# Patient Record
Sex: Female | Born: 1985
Health system: Southern US, Community
[De-identification: ages and names within clinical notes are randomized; demographics above are authoritative.]

## PROBLEM LIST (undated history)

## (undated) ENCOUNTER — Inpatient Hospital Stay (HOSPITAL_COMMUNITY): Payer: Self-pay

## (undated) DIAGNOSIS — R51 Headache: Secondary | ICD-10-CM

## (undated) DIAGNOSIS — Z91018 Allergy to other foods: Secondary | ICD-10-CM

## (undated) DIAGNOSIS — D573 Sickle-cell trait: Secondary | ICD-10-CM

## (undated) DIAGNOSIS — M792 Neuralgia and neuritis, unspecified: Secondary | ICD-10-CM

## (undated) DIAGNOSIS — Z8673 Personal history of transient ischemic attack (TIA), and cerebral infarction without residual deficits: Secondary | ICD-10-CM

## (undated) DIAGNOSIS — B999 Unspecified infectious disease: Secondary | ICD-10-CM

## (undated) DIAGNOSIS — R7303 Prediabetes: Secondary | ICD-10-CM

## (undated) DIAGNOSIS — O4400 Placenta previa specified as without hemorrhage, unspecified trimester: Secondary | ICD-10-CM

## (undated) DIAGNOSIS — E559 Vitamin D deficiency, unspecified: Secondary | ICD-10-CM

## (undated) DIAGNOSIS — I639 Cerebral infarction, unspecified: Secondary | ICD-10-CM

## (undated) DIAGNOSIS — R079 Chest pain, unspecified: Secondary | ICD-10-CM

## (undated) DIAGNOSIS — D649 Anemia, unspecified: Secondary | ICD-10-CM

## (undated) DIAGNOSIS — R0602 Shortness of breath: Secondary | ICD-10-CM

## (undated) DIAGNOSIS — I219 Acute myocardial infarction, unspecified: Secondary | ICD-10-CM

## (undated) DIAGNOSIS — I10 Essential (primary) hypertension: Secondary | ICD-10-CM

## (undated) DIAGNOSIS — IMO0002 Reserved for concepts with insufficient information to code with codable children: Secondary | ICD-10-CM

## (undated) DIAGNOSIS — K635 Polyp of colon: Secondary | ICD-10-CM

## (undated) HISTORY — DX: Shortness of breath: R06.02

## (undated) HISTORY — DX: Polyp of colon: K63.5

## (undated) HISTORY — DX: Allergy to other foods: Z91.018

## (undated) HISTORY — DX: Acute myocardial infarction, unspecified: I21.9

## (undated) HISTORY — DX: Neuralgia and neuritis, unspecified: M79.2

## (undated) HISTORY — DX: Vitamin D deficiency, unspecified: E55.9

## (undated) HISTORY — DX: Chest pain, unspecified: R07.9

## (undated) HISTORY — DX: Prediabetes: R73.03

## (undated) HISTORY — DX: Sickle-cell trait: D57.3

## (undated) HISTORY — DX: Cerebral infarction, unspecified: I63.9

## (undated) HISTORY — DX: Anemia, unspecified: D64.9

## (undated) HISTORY — DX: Reserved for concepts with insufficient information to code with codable children: IMO0002

## (undated) HISTORY — DX: Unspecified infectious disease: B99.9

## (undated) HISTORY — DX: Complete placenta previa nos or without hemorrhage, unspecified trimester: O44.00

---
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
## 2008-02-26 ENCOUNTER — Emergency Department (HOSPITAL_COMMUNITY): Admission: EM | Admit: 2008-02-26 | Discharge: 2008-02-26 | Payer: Self-pay | Admitting: Family Medicine

## 2008-04-23 ENCOUNTER — Emergency Department (HOSPITAL_COMMUNITY): Admission: EM | Admit: 2008-04-23 | Discharge: 2008-04-23 | Payer: Self-pay | Admitting: Emergency Medicine

## 2008-08-24 ENCOUNTER — Emergency Department (HOSPITAL_COMMUNITY): Admission: EM | Admit: 2008-08-24 | Discharge: 2008-08-24 | Payer: Self-pay | Admitting: Family Medicine

## 2008-09-28 ENCOUNTER — Inpatient Hospital Stay (HOSPITAL_COMMUNITY): Admission: AD | Admit: 2008-09-28 | Discharge: 2008-09-28 | Payer: Self-pay | Admitting: Obstetrics & Gynecology

## 2008-10-05 ENCOUNTER — Inpatient Hospital Stay (HOSPITAL_COMMUNITY): Admission: RE | Admit: 2008-10-05 | Discharge: 2008-10-05 | Payer: Self-pay | Admitting: Obstetrics & Gynecology

## 2008-12-31 DIAGNOSIS — D649 Anemia, unspecified: Secondary | ICD-10-CM

## 2008-12-31 DIAGNOSIS — R87619 Unspecified abnormal cytological findings in specimens from cervix uteri: Secondary | ICD-10-CM

## 2008-12-31 DIAGNOSIS — IMO0002 Reserved for concepts with insufficient information to code with codable children: Secondary | ICD-10-CM

## 2008-12-31 DIAGNOSIS — O4400 Placenta previa specified as without hemorrhage, unspecified trimester: Secondary | ICD-10-CM

## 2008-12-31 HISTORY — DX: Reserved for concepts with insufficient information to code with codable children: IMO0002

## 2008-12-31 HISTORY — DX: Unspecified abnormal cytological findings in specimens from cervix uteri: R87.619

## 2008-12-31 HISTORY — DX: Complete placenta previa nos or without hemorrhage, unspecified trimester: O44.00

## 2008-12-31 HISTORY — PX: HYSTEROSCOPY: SHX211

## 2008-12-31 HISTORY — DX: Anemia, unspecified: D64.9

## 2009-02-27 ENCOUNTER — Observation Stay (HOSPITAL_COMMUNITY): Admission: AD | Admit: 2009-02-27 | Discharge: 2009-02-28 | Payer: Self-pay | Admitting: Obstetrics and Gynecology

## 2009-02-27 ENCOUNTER — Inpatient Hospital Stay (HOSPITAL_COMMUNITY): Admission: AD | Admit: 2009-02-27 | Discharge: 2009-02-27 | Payer: Self-pay | Admitting: Obstetrics and Gynecology

## 2009-02-28 IMAGING — CT CT HEAD W/O CM
1 series · 16 of 28 positions shown, 20 images · non-contrast
Comparison: 04/04/2005.

CLINICAL DATA: 18 weeks pregnant.  Headache.  Throwing up.

CT HEAD WITHOUT CONTRAST
TECHNIQUE: Contiguous axial images were obtained from the base of
the skull through the vertex without contrast.

[Series 2: brain · axial · 0.47mm/px · z∈[+117,+242]mm · 16 of 28 slices shown, 20 images]
[im 2/28  brain]
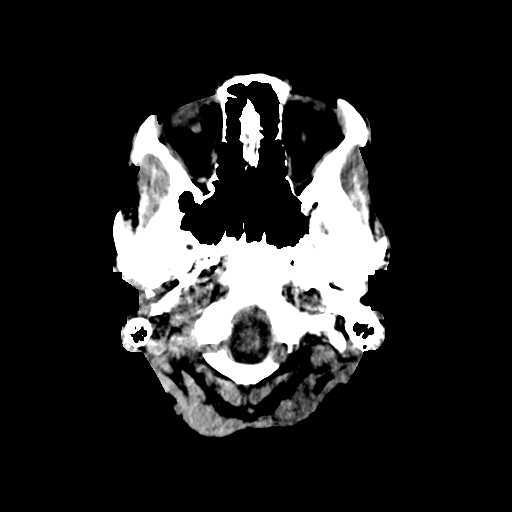
[im 2/28  bone]
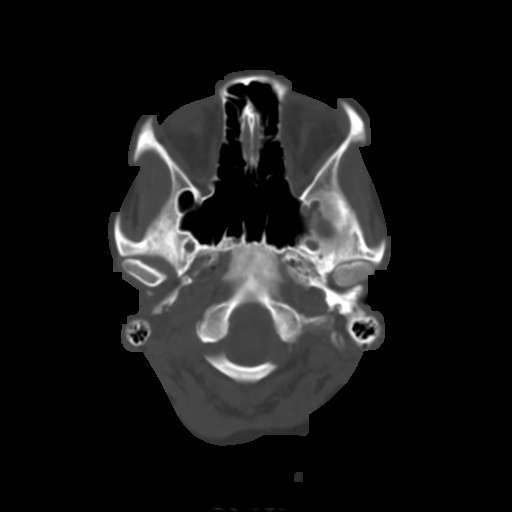
[im 4/28  brain]
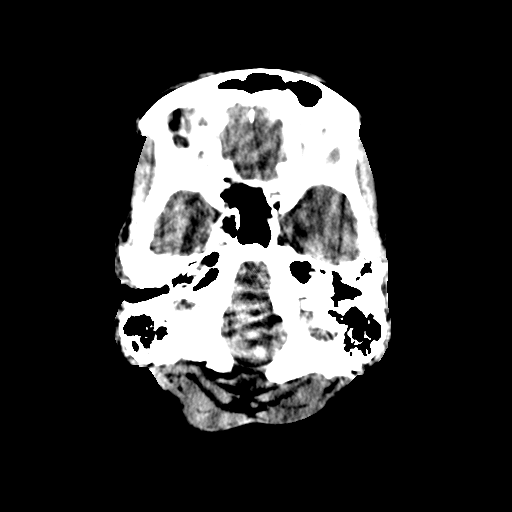
[im 6/28  brain]
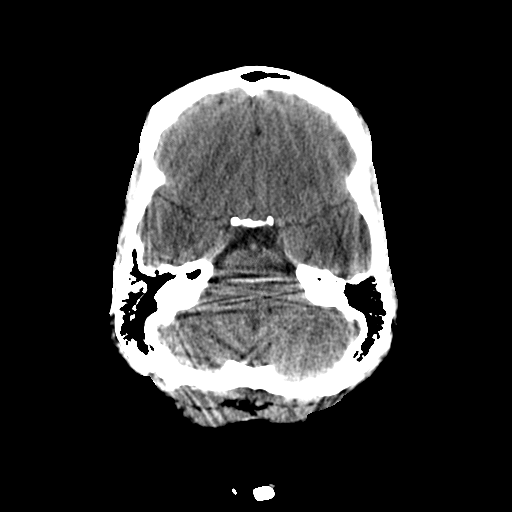
[im 7/28  brain]
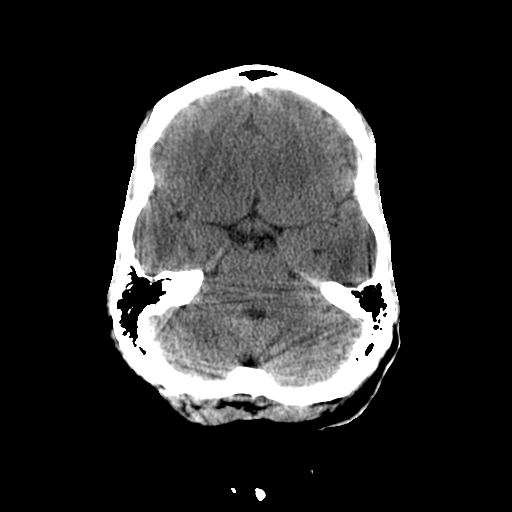
[im 9/28  brain]
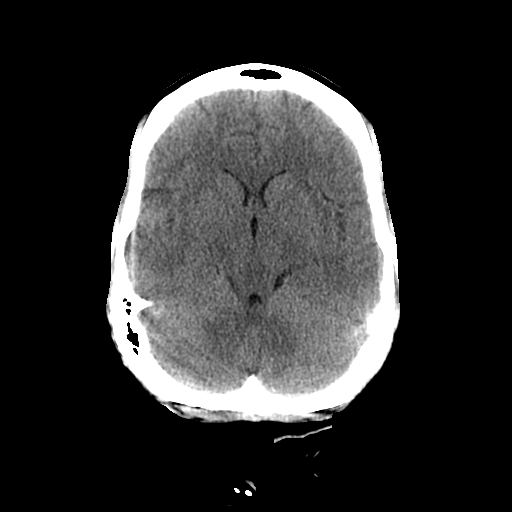
[im 9/28  bone]
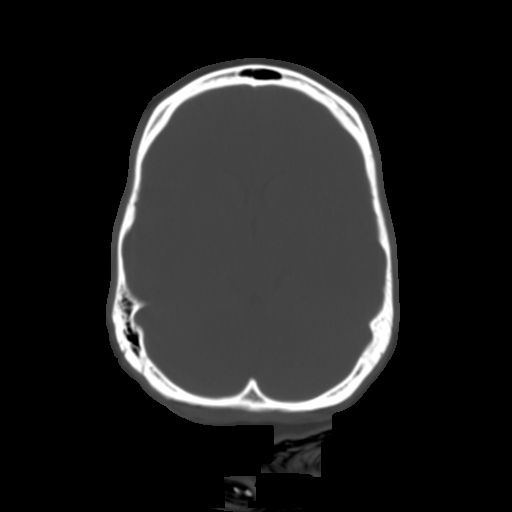
[im 10/28  brain]
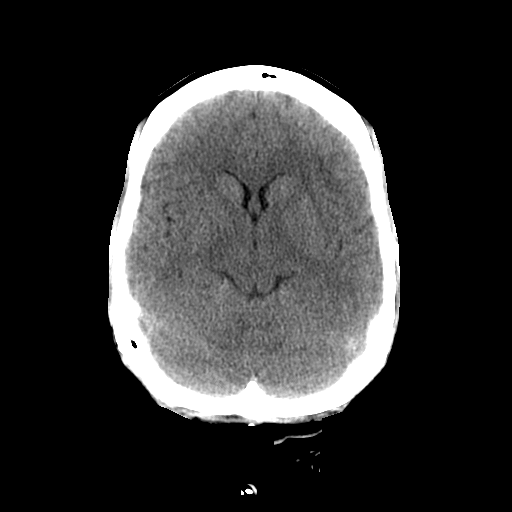
[im 12/28  brain]
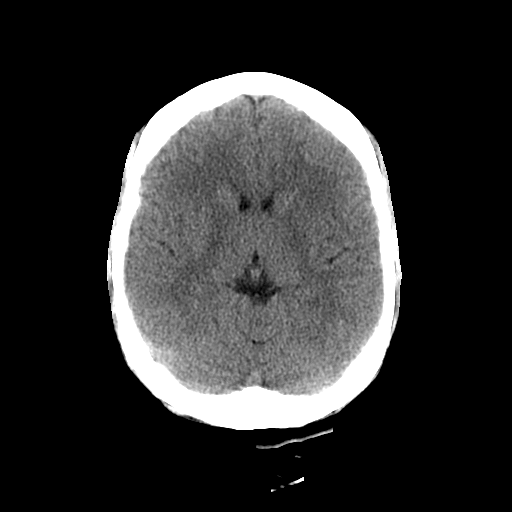
[im 14/28  brain]
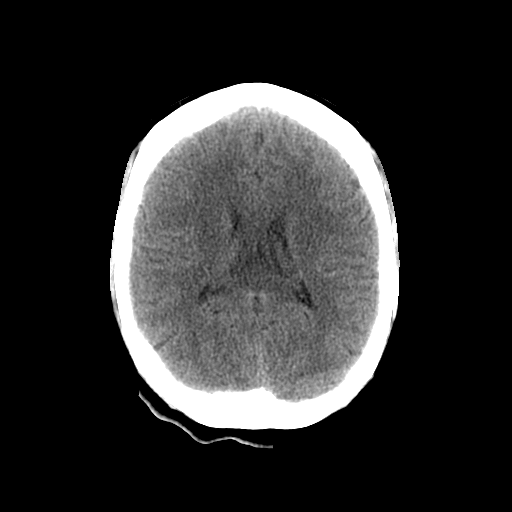
[im 15/28  brain]
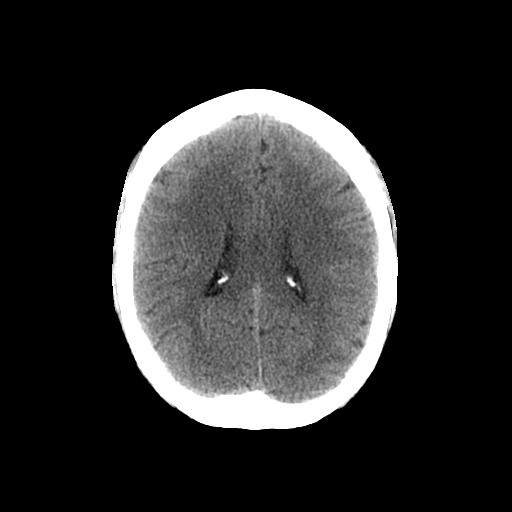
[im 15/28  bone]
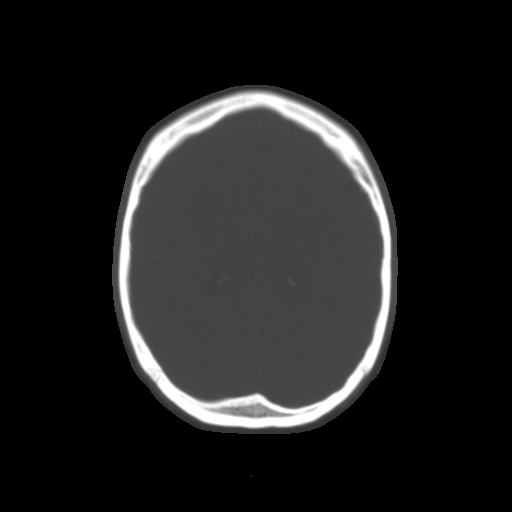
[im 17/28  brain]
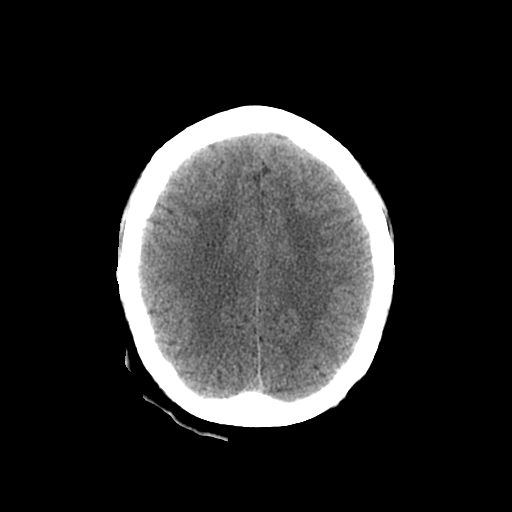
[im 19/28  brain]
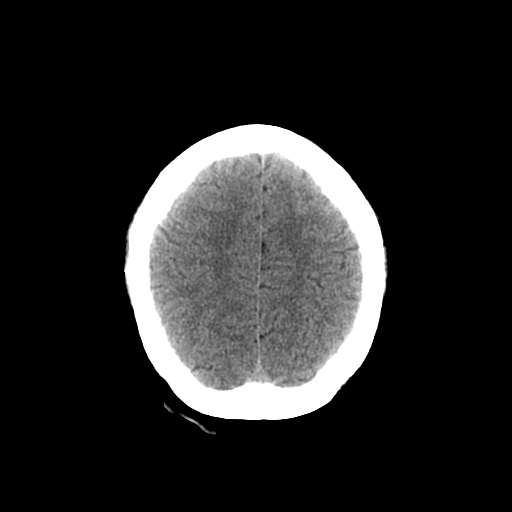
[im 20/28  brain]
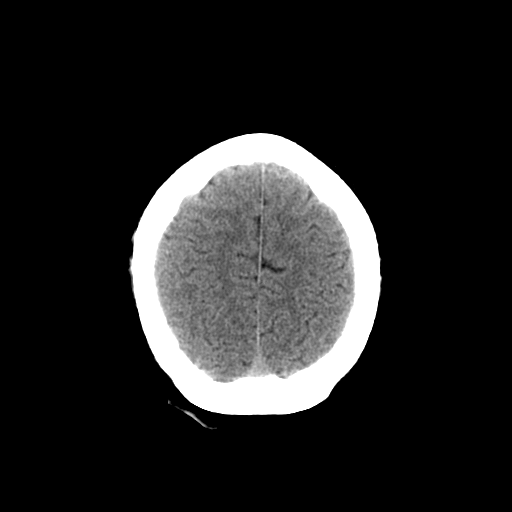
[im 22/28  brain]
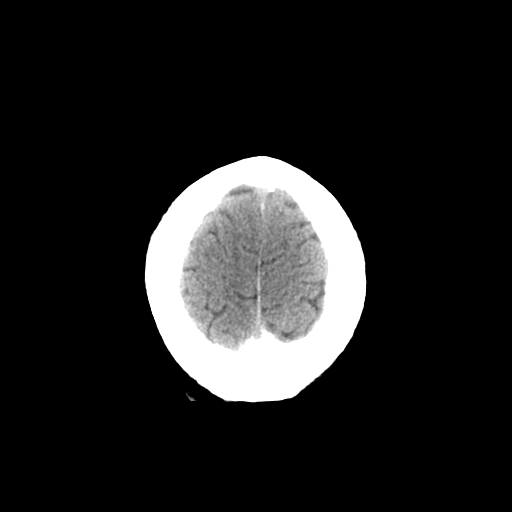
[im 22/28  bone]
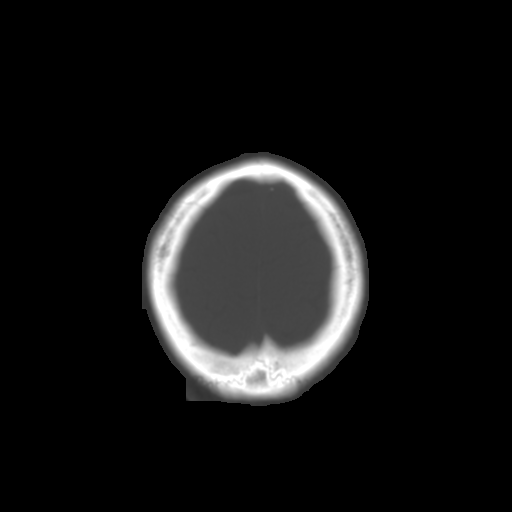
[im 23/28  brain]
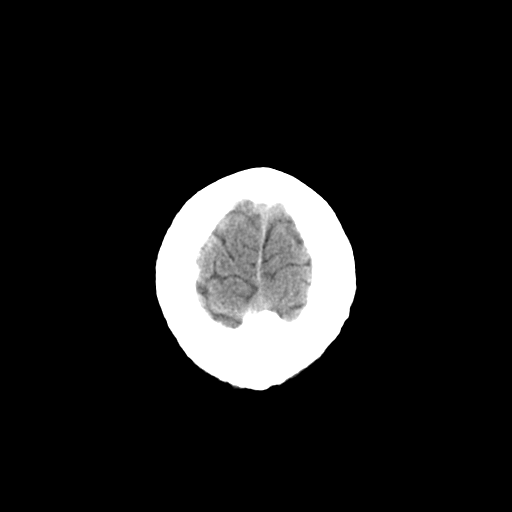
[im 25/28  brain]
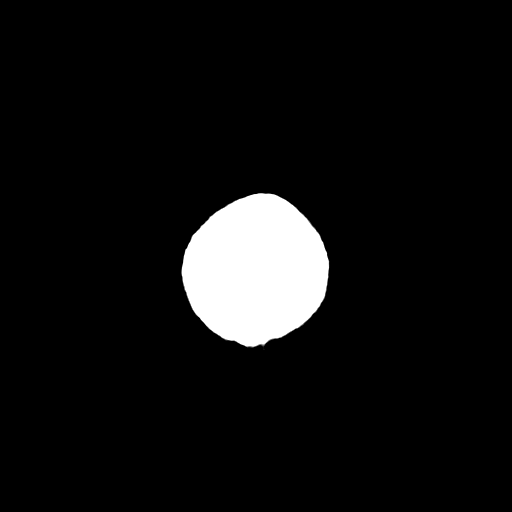
[im 27/28  brain]
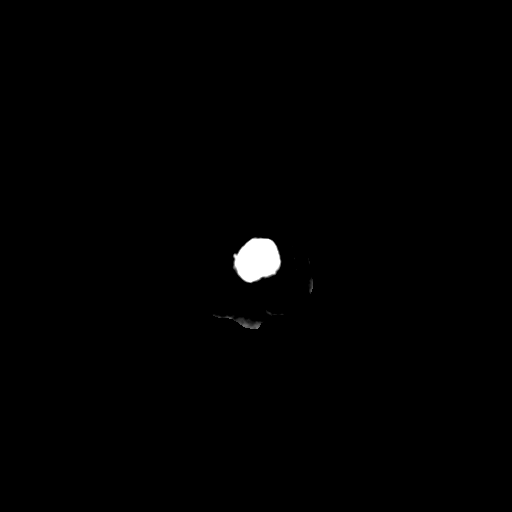

[16 of 28 positions shown; findings below may reference images not displayed]

FINDINGS: There is no mass lesion, mass effect, midline shift,
hydrocephalus, or hemorrhage.  Gray white differentiation is
preserved.  No territorial ischemia/infarct is identified.
Paranasal sinuses normal.
IMPRESSION: No acute intracranial abnormality.

## 2009-03-02 ENCOUNTER — Inpatient Hospital Stay (HOSPITAL_COMMUNITY): Admission: AD | Admit: 2009-03-02 | Discharge: 2009-03-02 | Payer: Self-pay | Admitting: Obstetrics & Gynecology

## 2009-03-10 ENCOUNTER — Inpatient Hospital Stay (HOSPITAL_COMMUNITY): Admission: AD | Admit: 2009-03-10 | Discharge: 2009-03-10 | Payer: Self-pay | Admitting: Obstetrics and Gynecology

## 2009-03-11 ENCOUNTER — Inpatient Hospital Stay (HOSPITAL_COMMUNITY): Admission: AD | Admit: 2009-03-11 | Discharge: 2009-03-13 | Payer: Self-pay | Admitting: Obstetrics and Gynecology

## 2009-03-14 ENCOUNTER — Ambulatory Visit: Admission: RE | Admit: 2009-03-14 | Discharge: 2009-03-14 | Payer: Self-pay | Admitting: Obstetrics and Gynecology

## 2009-08-26 ENCOUNTER — Encounter (INDEPENDENT_AMBULATORY_CARE_PROVIDER_SITE_OTHER): Payer: Self-pay | Admitting: Obstetrics and Gynecology

## 2009-08-26 ENCOUNTER — Ambulatory Visit (HOSPITAL_COMMUNITY): Admission: RE | Admit: 2009-08-26 | Discharge: 2009-08-26 | Payer: Self-pay | Admitting: Obstetrics and Gynecology

## 2009-08-28 ENCOUNTER — Inpatient Hospital Stay (HOSPITAL_COMMUNITY): Admission: AD | Admit: 2009-08-28 | Discharge: 2009-08-28 | Payer: Self-pay | Admitting: Obstetrics and Gynecology

## 2010-05-09 ENCOUNTER — Other Ambulatory Visit: Payer: Self-pay | Admitting: Obstetrics and Gynecology

## 2010-05-09 ENCOUNTER — Emergency Department (HOSPITAL_COMMUNITY): Admission: EM | Admit: 2010-05-09 | Discharge: 2010-05-10 | Payer: Self-pay | Admitting: Emergency Medicine

## 2010-05-11 ENCOUNTER — Emergency Department (HOSPITAL_COMMUNITY): Admission: EM | Admit: 2010-05-11 | Discharge: 2010-05-12 | Payer: Self-pay | Admitting: Emergency Medicine

## 2010-05-12 ENCOUNTER — Ambulatory Visit: Payer: Self-pay | Admitting: Cardiovascular Disease

## 2010-05-17 ENCOUNTER — Emergency Department (HOSPITAL_COMMUNITY): Admission: EM | Admit: 2010-05-17 | Discharge: 2010-05-18 | Payer: Self-pay | Admitting: Emergency Medicine

## 2010-07-07 ENCOUNTER — Emergency Department (HOSPITAL_BASED_OUTPATIENT_CLINIC_OR_DEPARTMENT_OTHER): Admission: EM | Admit: 2010-07-07 | Discharge: 2010-07-07 | Payer: Self-pay | Admitting: Emergency Medicine

## 2010-07-07 ENCOUNTER — Ambulatory Visit: Payer: Self-pay | Admitting: Diagnostic Radiology

## 2010-07-08 ENCOUNTER — Encounter: Payer: Self-pay | Admitting: Emergency Medicine

## 2010-07-08 ENCOUNTER — Emergency Department (HOSPITAL_COMMUNITY): Admission: EM | Admit: 2010-07-08 | Discharge: 2010-07-09 | Payer: Self-pay | Admitting: Emergency Medicine

## 2011-03-18 LAB — CBC
HCT: 41.2 % (ref 36.0–46.0)
Hemoglobin: 13.6 g/dL (ref 12.0–15.0)
MCH: 29.3 pg (ref 26.0–34.0)
MCHC: 33.1 g/dL (ref 30.0–36.0)
MCV: 88.5 fL (ref 78.0–100.0)
RBC: 4.65 MIL/uL (ref 3.87–5.11)

## 2011-03-18 LAB — APTT: aPTT: 32 seconds (ref 24–37)

## 2011-03-18 LAB — CK TOTAL AND CKMB (NOT AT ARMC)
CK, MB: 0.6 ng/mL (ref 0.3–4.0)
Relative Index: INVALID (ref 0.0–2.5)
Total CK: 90 U/L (ref 7–177)

## 2011-03-18 LAB — COMPREHENSIVE METABOLIC PANEL
ALT: 63 U/L — ABNORMAL HIGH (ref 0–35)
AST: 46 U/L — ABNORMAL HIGH (ref 0–37)
Alkaline Phosphatase: 116 U/L (ref 39–117)
CO2: 24 mEq/L (ref 19–32)
Calcium: 9.2 mg/dL (ref 8.4–10.5)
Chloride: 108 mEq/L (ref 96–112)
GFR calc Af Amer: >60 mL/min (ref >60)
GFR calc non Af Amer: >60 mL/min (ref >60)
Glucose, Bld: 104 mg/dL — ABNORMAL HIGH (ref 70–99)
Potassium: 3.5 mEq/L (ref 3.5–5.1)
Sodium: 138 mEq/L (ref 135–145)

## 2011-03-18 LAB — DIFFERENTIAL
Basophils Relative: 1 % (ref 0–1)
Eosinophils Absolute: 0.3 10*3/uL (ref 0.0–0.7)
Eosinophils Relative: 3 % (ref 0–5)
Lymphs Abs: 4.1 10*3/uL — ABNORMAL HIGH (ref 0.7–4.0)

## 2011-03-18 LAB — PROTIME-INR
INR: 1.02 (ref 0.00–1.49)
Prothrombin Time: 13.3 seconds (ref 11.6–15.2)

## 2011-03-19 LAB — D-DIMER, QUANTITATIVE: D-Dimer, Quant: 0.35 ug/mL-FEU (ref 0.00–0.48)

## 2011-03-19 LAB — CK TOTAL AND CKMB (NOT AT ARMC)
CK, MB: 0.4 ng/mL (ref 0.3–4.0)
Relative Index: INVALID (ref 0.0–2.5)
Total CK: 43 U/L (ref 7–177)

## 2011-03-19 LAB — CBC
Hemoglobin: 14.8 g/dL (ref 12.0–15.0)
Platelets: 224 10*3/uL (ref 150–400)
RDW: 13.6 % (ref 11.5–15.5)

## 2011-03-19 LAB — DIFFERENTIAL
Basophils Absolute: 0 10*3/uL (ref 0.0–0.1)
Lymphocytes Relative: 23 % (ref 12–46)
Monocytes Absolute: 0.4 10*3/uL (ref 0.1–1.0)
Neutro Abs: 5.7 10*3/uL (ref 1.7–7.7)
Neutrophils Relative %: 67 % (ref 43–77)

## 2011-03-19 LAB — BASIC METABOLIC PANEL
Calcium: 9.4 mg/dL (ref 8.4–10.5)
Creatinine, Ser: 0.72 mg/dL (ref 0.4–1.2)
GFR calc non Af Amer: >60 mL/min (ref >60)
Glucose, Bld: 112 mg/dL — ABNORMAL HIGH (ref 70–99)
Sodium: 135 mEq/L (ref 135–145)

## 2011-03-20 LAB — POCT CARDIAC MARKERS
CKMB, poc: <1 ng/mL — ABNORMAL LOW (ref 1.0–8.0)
CKMB, poc: <1 ng/mL — ABNORMAL LOW (ref 1.0–8.0)
Myoglobin, poc: 25.5 ng/mL (ref 12–200)
Myoglobin, poc: 51.9 ng/mL (ref 12–200)

## 2011-03-20 LAB — DIFFERENTIAL
Basophils Relative: 1 % (ref 0–1)
Eosinophils Absolute: 0.2 10*3/uL (ref 0.0–0.7)
Lymphs Abs: 3 10*3/uL (ref 0.7–4.0)
Monocytes Relative: 5 % (ref 3–12)
Neutro Abs: 4.2 10*3/uL (ref 1.7–7.7)
Neutrophils Relative %: 54 % (ref 43–77)

## 2011-03-20 LAB — POCT I-STAT, CHEM 8
BUN: 7 mg/dL (ref 6–23)
Chloride: 106 mEq/L (ref 96–112)
Chloride: 108 mEq/L (ref 96–112)
Creatinine, Ser: 0.6 mg/dL (ref 0.4–1.2)
Creatinine, Ser: 0.7 mg/dL (ref 0.4–1.2)
Glucose, Bld: 94 mg/dL (ref 70–99)
HCT: 48 % — ABNORMAL HIGH (ref 36.0–46.0)
Hemoglobin: 16.3 g/dL — ABNORMAL HIGH (ref 12.0–15.0)
Potassium: 3.2 mEq/L — ABNORMAL LOW (ref 3.5–5.1)
Potassium: 4 mEq/L (ref 3.5–5.1)
Sodium: 141 mEq/L (ref 135–145)
Sodium: 144 mEq/L (ref 135–145)

## 2011-03-20 LAB — CBC
HCT: 39.8 % (ref 36.0–46.0)
Hemoglobin: 13.6 g/dL (ref 12.0–15.0)
MCHC: 34.2 g/dL (ref 30.0–36.0)
MCV: 87.9 fL (ref 78.0–100.0)
Platelets: 200 10*3/uL (ref 150–400)
Platelets: 202 10*3/uL (ref 150–400)
RBC: 4.89 MIL/uL (ref 3.87–5.11)
RDW: 13.4 % (ref 11.5–15.5)
WBC: 7.9 10*3/uL (ref 4.0–10.5)

## 2011-03-20 LAB — GLUCOSE, CAPILLARY: Glucose-Capillary: 103 mg/dL — ABNORMAL HIGH (ref 70–99)

## 2011-03-20 LAB — D-DIMER, QUANTITATIVE: D-Dimer, Quant: <0.22 ug/mL-FEU (ref 0.00–0.48)

## 2011-04-07 LAB — URINE MICROSCOPIC-ADD ON

## 2011-04-07 LAB — CBC
MCHC: 32 g/dL (ref 30.0–36.0)
MCV: 79.9 fL (ref 78.0–100.0)
Platelets: 203 10*3/uL (ref 150–400)
Platelets: 224 10*3/uL (ref 150–400)
RBC: 4.73 MIL/uL (ref 3.87–5.11)
RDW: 19.2 % — ABNORMAL HIGH (ref 11.5–15.5)
WBC: 5.3 10*3/uL (ref 4.0–10.5)

## 2011-04-07 LAB — URINALYSIS, ROUTINE W REFLEX MICROSCOPIC
Glucose, UA: NEGATIVE mg/dL
Leukocytes, UA: NEGATIVE
Protein, ur: NEGATIVE mg/dL
Specific Gravity, Urine: >1.03 — ABNORMAL HIGH (ref 1.005–1.030)

## 2011-04-07 LAB — DIFFERENTIAL
Eosinophils Absolute: 0.3 10*3/uL (ref 0.0–0.7)
Lymphocytes Relative: 47 % — ABNORMAL HIGH (ref 12–46)
Lymphs Abs: 2.5 10*3/uL (ref 0.7–4.0)
Monocytes Relative: 10 % (ref 3–12)
Neutro Abs: 2 10*3/uL (ref 1.7–7.7)
Neutrophils Relative %: 38 % — ABNORMAL LOW (ref 43–77)

## 2011-04-07 LAB — HCG, SERUM, QUALITATIVE: Preg, Serum: NEGATIVE

## 2011-04-12 LAB — CBC
HCT: 26.7 % — ABNORMAL LOW (ref 36.0–46.0)
MCV: 87.9 fL (ref 78.0–100.0)
Platelets: 157 10*3/uL (ref 150–400)
Platelets: 163 10*3/uL (ref 150–400)
WBC: 14.2 10*3/uL — ABNORMAL HIGH (ref 4.0–10.5)
WBC: 14.4 10*3/uL — ABNORMAL HIGH (ref 4.0–10.5)

## 2011-04-17 LAB — COMPREHENSIVE METABOLIC PANEL
ALT: 14 U/L (ref 0–35)
AST: 22 U/L (ref 0–37)
Albumin: 3.1 g/dL — ABNORMAL LOW (ref 3.5–5.2)
CO2: 22 mEq/L (ref 19–32)
Chloride: 106 mEq/L (ref 96–112)
GFR calc Af Amer: >60 mL/min (ref >60)
GFR calc non Af Amer: >60 mL/min (ref >60)
Sodium: 135 mEq/L (ref 135–145)
Total Bilirubin: 0.8 mg/dL (ref 0.3–1.2)

## 2011-04-17 LAB — URINALYSIS, ROUTINE W REFLEX MICROSCOPIC
Bilirubin Urine: NEGATIVE
Nitrite: NEGATIVE
Nitrite: NEGATIVE
Specific Gravity, Urine: <1.005 — ABNORMAL LOW (ref 1.005–1.030)
Specific Gravity, Urine: <1.005 — ABNORMAL LOW (ref 1.005–1.030)
Urobilinogen, UA: 0.2 mg/dL (ref 0.0–1.0)
pH: 6 (ref 5.0–8.0)

## 2011-04-17 LAB — CBC
RBC: 4.1 MIL/uL (ref 3.87–5.11)
WBC: 9.3 10*3/uL (ref 4.0–10.5)

## 2011-04-19 ENCOUNTER — Inpatient Hospital Stay (INDEPENDENT_AMBULATORY_CARE_PROVIDER_SITE_OTHER)
Admission: RE | Admit: 2011-04-19 | Discharge: 2011-04-19 | Disposition: A | Discharge status: Home / Self Care | Payer: 59 | Source: Ambulatory Visit | Attending: Family Medicine | Admitting: Family Medicine

## 2011-04-19 DIAGNOSIS — L259 Unspecified contact dermatitis, unspecified cause: Secondary | ICD-10-CM

## 2011-04-27 ENCOUNTER — Ambulatory Visit
Admission: RE | Admit: 2011-04-27 | Discharge: 2011-04-27 | Disposition: A | Discharge status: Home / Self Care | Payer: 59 | Source: Ambulatory Visit | Attending: Chiropractic Medicine | Admitting: Chiropractic Medicine

## 2011-04-27 ENCOUNTER — Other Ambulatory Visit: Payer: Self-pay | Admitting: Chiropractic Medicine

## 2011-04-27 DIAGNOSIS — R51 Headache: Secondary | ICD-10-CM

## 2011-04-27 DIAGNOSIS — M545 Low back pain: Secondary | ICD-10-CM

## 2011-04-27 DIAGNOSIS — M542 Cervicalgia: Secondary | ICD-10-CM

## 2011-05-05 ENCOUNTER — Emergency Department (HOSPITAL_COMMUNITY)
Admission: EM | Admit: 2011-05-05 | Discharge: 2011-05-06 | Disposition: A | Discharge status: Home / Self Care | Payer: 59 | Attending: Emergency Medicine | Admitting: Emergency Medicine

## 2011-05-05 DIAGNOSIS — J40 Bronchitis, not specified as acute or chronic: Secondary | ICD-10-CM | POA: Insufficient documentation

## 2011-05-05 DIAGNOSIS — R05 Cough: Secondary | ICD-10-CM | POA: Insufficient documentation

## 2011-05-05 DIAGNOSIS — J45909 Unspecified asthma, uncomplicated: Secondary | ICD-10-CM | POA: Insufficient documentation

## 2011-05-05 DIAGNOSIS — R509 Fever, unspecified: Secondary | ICD-10-CM | POA: Insufficient documentation

## 2011-05-05 DIAGNOSIS — R059 Cough, unspecified: Secondary | ICD-10-CM | POA: Insufficient documentation

## 2011-05-15 NOTE — H&P (Signed)
NAME:  Melissa [name], GRUEL NO.:  [id number]   MEDICAL RECORD NO.:  [id number]          PATIENT TYPE:  AMB   LOCATION:  DAY                           [facility]   PHYSICIAN:  Osborn Coho, M.D.   DATE OF BIRTH:  15-Nov-1986   DATE OF ADMISSION:  08/26/2009  DATE OF DISCHARGE:                              HISTORY & PHYSICAL   HISTORY OF PRESENT ILLNESS:  Ms. Melissa [name] is a 25 year old single African  American female para 1-0-0-1 presenting for hysteroscopy with D&C  because of irregular vaginal bleeding and an endometrial polyp.  Since  delivering by spontaneous vaginal birth on March 11, 2009, the patient  states she has had daily bleeding.  The patient's bleeding has been as  brisk as requiring a pad change every 15 minutes (with occasional  soiling of her clothes) to having to use on one pad per day.  In recent  days, she has only had to change one pad per day.  She also has  experienced cramping which she rates as at 6/10 on a 10-point pain  scale; however, has not required any analgesia.  The patient was seen to  [facility] initially on August 19 at which time she was  placed on a week's worth of medroxyprogesterone acetate 10 mg daily  which the patient states decreased her bleeding some; however, she  noticed that her cramping actually increased.  She had a normal CBC,  TSH, and prolactin level along with a negative serum pregnancy test.  A  pelvic ultrasound demonstrated the uterus measuring 6.9 x 3.54 x 4.48 cm  with a hyperechoic mass within the endometrium measuring 0.71 x 0.69 cm  with single blood flow with color flow Doppler suggestive of a polyp.  The patient's left ovary measured 3.00 x 3.13 x 2.01 and right ovary  measured 3.76 x 3.58 x 2.56 cm.  The patient was given the option of  managing her bleeding and endometrial polyp through observation, undergo  a sonohystogram or hysteroscopy with D&C.  The patient has decided to  proceed with  hysteroscopy, D&C.   OB HISTORY:  Gravida 1, para 1-0-0-1.  The patient had a spontaneous  vaginal birth on March 11, 2009.  She does give a history of preterm  labor.   GYN HISTORY:  Menarche at 25 years old.  Last menstrual period on May 22, 2008.  She denies any sexually transmitted diseases or history of  abnormal Pap smears.  Last normal Pap smear was in April 2010.   PAST MEDICAL HISTORY:  Sickle cell trait, asthma, migraine headaches,  and seasonal allergies.   PAST SURGICAL HISTORY:  Negative.  The patient further denies any blood  transfusions.   FAMILY HISTORY:  Diabetes, hypertension, asthma, peptic ulcer disease.   HABITS:  She does not use tobacco, illicit drugs, and rarely consumes  alcohol except on special occasions.   SOCIAL HISTORY:  The patient is single and she is a Consulting civil engineer at [employer]   CURRENT MEDICATIONS:  None.   ALLERGIES:  GINGER which causes her throat to  close.   REVIEW OF SYSTEMS:  The patient does wear glasses.  She denies any chest  pain, shortness of breath, headache, vision changes, dysuria, hematuria,  urinary frequency, urgency, nausea, vomiting, diarrhea, fever, night  sweats and except as is mentioned in history of present illness, the  patient's review of systems is otherwise negative.   PHYSICAL EXAMINATION:  VITAL SIGNS:  Blood pressure 100/70, pulse is 68,  respiration 12, temperature 98.6 degrees Fahrenheit orally.  Weight is  170 pounds, height 5 feet 4-1/2 inches tall.  NECK: Supple without masses.  There is no thyromegaly or cervical  adenopathy.  HEART:  Regular rate and rhythm.  There is no murmur.  LUNGS:  Clear.  BACK:  No CVA tenderness.  ABDOMEN:  No tenderness, masses, or organomegaly.  EXTREMITIES:  No clubbing, cyanosis, or edema.  PELVIC:  EGBUS is within normal limits.  Vagina is rugous.  Cervix is  nontender without lesions.  Uterus appears normal size, shape, and  consistency without tenderness.  Adnexa without  tenderness.   LABORATORY DATA:  Urine pregnancy test was negative.   IMPRESSION:  1. Irregular vaginal bleeding.  2. Endometrial polyp.   DISPOSITION:  A discussion was held with the patient regarding the  indications for her procedure along with a description of the procedure  and its risks which include, but are not limited to reaction to  anesthesia, damage to adjacent organs, infection, excessive bleeding,  and the potential of endometrial scarring.  The patient verbalized  understanding of these risks.  She was also given a copy of the ACOG  brochure on hysteroscopy.  She has consented to proceed with  hysteroscopy, D&C at [facility] on August 26, 2009 at  9:15 a.m.      [place] J. Adline Peals.      Osborn Coho, M.D.  Electronically Signed    EJP/MEDQ  D:  08/23/2009  T:  08/24/2009  Job:  [id number]

## 2011-05-15 NOTE — Discharge Summary (Signed)
Melissa [name], CUMPIAN                  ACCOUNT NO.:  [id number]   MEDICAL RECORD NO.:  [id number]          PATIENT TYPE:  INP   LOCATION:  9319                          [facility]ILITY:  [facility]   PHYSICIAN:  Gerrit Friends. Aldona Bar, M.D.   DATE OF BIRTH:  July 28, 1986   DATE OF ADMISSION:  03/11/2009  DATE OF DISCHARGE:  03/13/2009                               DISCHARGE SUMMARY   DISCHARGE DIAGNOSES:  1. Term pregnancy delivered 6 pounds 15 ounces female infant, Apgars 8      and 9.  2. Blood type O+.   SUMMARY:  1. Normal spontaneous delivery.  2. Second-degree tear and repair.   SUMMARY:  This 25 year old primigravida was admitted fully dilated in  labor, after having had spontaneous rupture of membranes at home on the  early morning of March 11, 2009.  She was delivered of a 6 pounds 15  ounces female infant with Apgar of 8 and 9 over a second-degree tear  which was repaired subsequently without difficulty.  She did have some  postpartum hemorrhage treated with intramuscular Pitocin and IM  Methergine.  Her postpartum course otherwise was uncomplicated.  The  baby was transferred to the Newborn Intensive Care Unit for observation  and a sepsis workup which was negative and on the morning of March 13, 2009, both were doing well and baby was ultimately discharged to home as  well.   Mother's discharge hemoglobin was 9.0 with a white count of 14,400 and a  platelet count of 163,000.  On the morning of March 13, 2009, she was  ambulating well, tolerating a regular diet well, having normal bowel and  bladder function.  She was afebrile.  She was breast-feeding without  difficulty and was desirous of discharge.  Accordingly, she was given  all appropriate instructions and understood all instructions well.   DISCHARGE MEDICATIONS:  Vitamins - one a day as long as she is breast-  feeding, Feosol capsules - one daily, Motrin 16 mg every 6 hours as  needed for cramping or mild pain, and Tylox 1-2 evert 4-6  hours as  needed for more severe pain.  She will return to the office for followup  in approximately 4 weeks' time or as needed.  As mentioned, she was  given at discharge brochure with all appropriate instructions carefully  explained which she understood well.   CONDITION ON DISCHARGE:  Improved.      Gerrit Friends. Aldona Bar, M.D.  Electronically Signed     RMW/MEDQ  D:  03/13/2009  T:  03/14/2009  Job:  [id number]

## 2011-05-15 NOTE — Op Note (Signed)
Melissa, [name] NO.:  [id number]   MEDICAL RECORD NO.:  [id number]          PATIENT TYPE:  AMB   LOCATION:  SDC                           [facility]   PHYSICIAN:  Osborn Coho, M.D.   DATE OF BIRTH:  [birth year]   DATE OF PROCEDURE:  08/26/2009  DATE OF DISCHARGE:                               OPERATIVE REPORT   PREOPERATIVE DIAGNOSES:  1. Irregular bleeding.  2. Polyp.   POSTOPERATIVE DIAGNOSES:  1. Irregular bleeding.  2. Polyp.   PROCEDURE:  1. Hysteroscopy.  2. Dilation and curettage.   ATTENDING DOCTOR:  Osborn Coho, MD   ANESTHESIA:  General via LMA.   SPECIMEN:  To Pathology endometrial curettings.   FLUIDS:  1000 mL.   URINE OUTPUT:  Quantity sufficient via straight cath prior to procedure.   ESTIMATED BLOOD LOSS:  Minimal.   COMPLICATIONS:  None.   PROCEDURE:  The patient was taken to the operating room after the risks,  benefits, and alternatives were discussed with the patient.  The patient  verbalized understanding, consent signed and witnessed.  The patient was  placed under general anesthesia and prepped and draped in the normal  sterile fashion in the dorsal lithotomy position.  A bivalve speculum  was placed in the patient's vagina and a paracervical block administered  using a total of 10 mL of 1% lidocaine.  The cervix was dilated for  passage of the hysteroscope.  The hysteroscope was introduced and blood  clot noted in the uterine cavity.  Curettage was performed, so a gritty  texture was noted and curettings sent to pathology.  Hysteroscope was reintroduced and no obvious intracavitary lesions were  noted.  All instruments were removed.  There was good hemostasis at the  tenaculum sites.  Count was correct.  The patient tolerated the  procedure well and is currently awaiting transfer to the recovery room  in good condition.      Osborn Coho, M.D.  Electronically Signed     AR/MEDQ  D:  08/26/2009   T:  08/27/2009  Job:  [id number]

## 2011-05-18 NOTE — Discharge Summary (Signed)
Melissa [name], DOMBECK NO.:  [id number]   MEDICAL RECORD NO.:  [id number]          PATIENT TYPE:  OBV   LOCATION:  9198                          [facility]   PHYSICIAN:  Malva Limes, M.D.    DATE OF BIRTH:  1986/09/28   DATE OF ADMISSION:  02/27/2009  DATE OF DISCHARGE:  02/28/2009                               DISCHARGE SUMMARY   FINAL DIAGNOSES:  1. Intrauterine pregnancy at 55 weeks' gestation.  2. Uterine contractions.  3. False labor.   COMPLICATIONS:  None.   This 25 year old G1, P0, presents at 5 weeks' gestation with  contractions, possible labor.  The patient was admitted for observation.  The patient, all her PIH labs were normal.  She was having some  headaches.  Cervix did not change and there was no signs of labor.  The  patient was sent home on her regular diet, told to continue her prenatal  vitamins and reviewed and to keep her appointment on Thursday, March 03, 2009.  Labor precautions were reviewed with the patient.      [name], P.A.-C.    ______________________________  Malva Limes, M.D.    MB/MEDQ  D:  04/04/2009  T:  04/05/2009  Job:  [id number]

## 2011-10-01 LAB — URINALYSIS, ROUTINE W REFLEX MICROSCOPIC
Glucose, UA: NEGATIVE
Nitrite: NEGATIVE
Specific Gravity, Urine: <1.005 — ABNORMAL LOW
pH: 5.5

## 2011-10-01 LAB — COMPREHENSIVE METABOLIC PANEL
AST: 26
BUN: 2 — ABNORMAL LOW
CO2: 23
Calcium: 9.7
Creatinine, Ser: 0.58
GFR calc Af Amer: >60
GFR calc non Af Amer: >60

## 2011-10-01 LAB — CBC
HCT: 39.6
MCHC: 33.2
MCV: 88.7
Platelets: 211
RBC: 4.47

## 2011-10-01 LAB — DIFFERENTIAL
Basophils Absolute: 0
Eosinophils Relative: 2
Lymphocytes Relative: 24
Lymphs Abs: 2
Neutro Abs: 5.5

## 2012-01-01 DIAGNOSIS — I639 Cerebral infarction, unspecified: Secondary | ICD-10-CM

## 2012-01-01 HISTORY — DX: Cerebral infarction, unspecified: I63.9

## 2012-05-19 DIAGNOSIS — R0602 Shortness of breath: Secondary | ICD-10-CM | POA: Insufficient documentation

## 2012-05-19 DIAGNOSIS — G8929 Other chronic pain: Secondary | ICD-10-CM | POA: Insufficient documentation

## 2012-05-19 DIAGNOSIS — R51 Headache: Secondary | ICD-10-CM | POA: Insufficient documentation

## 2012-05-20 ENCOUNTER — Encounter (HOSPITAL_COMMUNITY): Payer: Self-pay | Admitting: Emergency Medicine

## 2012-05-20 ENCOUNTER — Emergency Department (HOSPITAL_COMMUNITY)
Admission: EM | Admit: 2012-05-20 | Discharge: 2012-05-20 | Disposition: A | Discharge status: Home / Self Care | Payer: 59 | Attending: Emergency Medicine | Admitting: Emergency Medicine

## 2012-05-20 DIAGNOSIS — R51 Headache: Secondary | ICD-10-CM

## 2012-05-20 MED ORDER — KETOROLAC TROMETHAMINE 30 MG/ML IJ SOLN
30.0000 mg | Freq: Once | INTRAMUSCULAR | Status: AC
Start: 2012-05-20 — End: 2012-05-20
  Administered 2012-05-20: 30 mg via INTRAVENOUS
  Filled 2012-05-20: qty 1

## 2012-05-20 MED ORDER — METOCLOPRAMIDE HCL 5 MG/ML IJ SOLN
10.0000 mg | Freq: Once | INTRAMUSCULAR | Status: AC
  Administered 2012-05-20: 10 mg via INTRAMUSCULAR
  Filled 2012-05-20: qty 2

## 2012-05-20 MED ORDER — DIPHENHYDRAMINE HCL 50 MG/ML IJ SOLN
25.0000 mg | Freq: Once | INTRAMUSCULAR | Status: AC
  Administered 2012-05-20: 25 mg via INTRAMUSCULAR
  Filled 2012-05-20: qty 1

## 2012-05-20 NOTE — Discharge Instructions (Signed)
Please follow up with your doctors for further workup of your ongoing pain.  Return to the ER for worsening condition or new concerning symptoms.  General Headache, Without Cause A general headache has no specific cause. These headaches are not life-threatening. They will not lead to other types of headaches. HOME CARE   Make and keep follow-up visits with your doctor.   Only take medicine as told by your doctor.   Try to relax, get a massage, or use your thoughts to control your body (biofeedback).   Apply cold or heat to the head and neck. Apply 3 or 4 times a day or as needed.  Finding out the results of your test Ask when your test results will be ready. Make sure you get your test results. GET HELP RIGHT AWAY IF:   You have problems with medicine.   Your medicine does not help relieve pain.   Your headache changes or becomes worse.   You feel sick to your stomach (nauseous) or throw up (vomit).   You have a temperature by mouth above 102 F (38.9 C), not controlled by medicine.   Your have a stiff neck.   You have vision loss.   You have muscle weakness.   You lose control of your muscles.   You lose balance or have trouble walking.   You feel like you are going to pass out (faint).  MAKE SURE YOU:   Understand these instructions.   Will watch this condition.   Will get help right away if you are not doing well or get worse.  Document Released: 09/25/2008 Document Revised: 12/06/2011 Document Reviewed: 09/25/2008 [facility] Patient Information 2012 [place], [place].  Pain Management, Chronic You have a painful condition that has required frequent use of narcotic-type pain medicine. We would like to see that you receive the best possible care for your problem. To achieve this, you must have a personal physician who can supervise a treatment plan for you. You may locate a personal physician on your own or contact one of the doctors whose name has been given to  you. If your physician determines that you need to visit the Emergency Department for pain control, that doctor should provide you with a pain contract. This is a letter from your doctor which describes what pain medicine you may receive, how much and how often. You sign it agreeing to the terms of the treatment plan. Bring this with each time you come to this facility. It will help the Emergency Physician provide the proper treatment for you with minimal delay. Please Note: In the future you may not be able to receive narcotic pain medicine from this facility without a pain contract or telephone approval from your personal physician. Document Released: 08/09/2004 Document Revised: 12/06/2011 Document Reviewed: 12/13/2008 [facility] Patient Information 2012 [place], [place].

## 2012-05-20 NOTE — ED Notes (Signed)
Pt presents with a c/c of left sided pain, from her head to her abdomen.  Pt st's she was worked up for a stroke about a year ago when she lost feeling and function of her entire left side; pt st's she has been dealing with residual effects since then.  St's she is being worked up for lupus and MS.  Pt in nad.

## 2012-05-20 NOTE — ED Notes (Addendum)
Pt states that she has had a headache most of the day and started having SOB a few hours ago. She also has a hx of asthma. Pt states that she is currently being treated at the pain clinic because they think she may have had a stroke in the past and she is being tested for MS.

## 2012-05-20 NOTE — ED Notes (Signed)
EDP Otter in with pt 

## 2012-05-20 NOTE — ED Provider Notes (Signed)
History     CSN: [id number]  Arrival date & time 05/19/12  2305   First MD Initiated Contact with Patient 05/20/12 [phone]      Chief Complaint  Patient presents with  . Headache  . Shortness of Breath    (Consider location/radiation/quality/duration/timing/severity/associated sxs/prior treatment) HPI 26 year old female presents to the emergency department complaining of headache left shoulder and axilla pain, and shortness of breath. Patient reports onset of left-sided headache yesterday afternoon. Patient has history of chronic pain on her left side thought to be due to either MS, lupus or stroke. Patient took her scheduled Vicodin, but then started having worsening flare of her chronic left-sided pain. Patient reports she does occasionally have headaches, but normal and they get better after taking Vicodin. Today's headache is left-sided. It is not associated with photophobia or phonophobia. She has had nausea but no vomiting. She denies any specific weakness numbness difficulties walking speaking associated with this headache. Headache was not sudden and worsening at onset shortness of breath is similar to her prior asthma flares. She reports the shortness of breath has resolved since arriving in the ER Past Medical History  Diagnosis Date  . Asthma     No past surgical history on file.  No family history on file.  History  Substance Use Topics  . Smoking status: Not on file  . Smokeless tobacco: Not on file  . Alcohol Use:     OB History    Grav Para Term Preterm Abortions TAB SAB Ect Mult Living                  Review of Systems  All other systems reviewed and are negative.    Allergies  Review of patient's allergies indicates no known allergies.  Home Medications   Current Outpatient Rx  Name Route Sig Dispense Refill  . HYDROCODONE-ACETAMINOPHEN 5-325 MG PO TABS Oral Take 1 tablet by mouth every 6 (six) hours as needed. For pain      BP 99/66  Pulse 93   Temp(Src) 98.2 F (36.8 C) (Oral)  Resp 18  SpO2 100%  Physical Exam  Nursing note and vitals reviewed. Constitutional: She is oriented to person, place, and time. She appears well-developed and well-nourished.  HENT:  Head: Normocephalic and atraumatic.  Nose: Nose normal.  Mouth/Throat: Oropharynx is clear and moist.  Eyes: Conjunctivae and EOM are normal. Pupils are equal, round, and reactive to light.  Neck: Normal range of motion. Neck supple. No JVD present. No tracheal deviation present. No thyromegaly present.  Cardiovascular: Normal rate, regular rhythm, normal heart sounds and intact distal pulses.  Exam reveals no gallop and no friction rub.   No murmur heard. Pulmonary/Chest: Effort normal and breath sounds normal. No stridor. No respiratory distress. She has no wheezes. She has no rales. She exhibits no tenderness.  Abdominal: Soft. Bowel sounds are normal. She exhibits no distension and no mass. There is no tenderness. There is no rebound and no guarding.  Musculoskeletal: Normal range of motion. She exhibits no edema and no tenderness.  Lymphadenopathy:    She has no cervical adenopathy.  Neurological: She is alert and oriented to person, place, and time. She has normal reflexes. No cranial nerve deficit. She exhibits normal muscle tone. Coordination normal.  Skin: Skin is dry. No rash noted. No erythema. No pallor.  Psychiatric: She has a normal mood and affect. Her behavior is normal. Judgment and thought content normal.    ED Course  Procedures (including  critical care time)  Labs Reviewed - No data to display No results found.   Date: 05/20/2012  Rate: 102  Rhythm: sinus tachycardia  QRS Axis: normal  Intervals: normal  ST/T Wave abnormalities: normal  Conduction Disutrbances:none  Narrative Interpretation:   Old EKG Reviewed: none available and unchanged   1. Headache   2. Chronic pain       MDM  Patient has had resolution of the headache. No  further shortness of breath. To followup with her primary care Dr.        Olivia Mackie, MD 05/21/12 [phone]

## 2012-07-09 ENCOUNTER — Other Ambulatory Visit: Payer: Self-pay | Admitting: Neurology

## 2012-07-09 DIAGNOSIS — G43019 Migraine without aura, intractable, without status migrainosus: Secondary | ICD-10-CM

## 2012-07-11 ENCOUNTER — Ambulatory Visit
Admission: RE | Admit: 2012-07-11 | Discharge: 2012-07-11 | Disposition: A | Discharge status: Home / Self Care | Payer: 59 | Source: Ambulatory Visit | Attending: Neurology | Admitting: Neurology

## 2012-07-11 DIAGNOSIS — G43019 Migraine without aura, intractable, without status migrainosus: Secondary | ICD-10-CM

## 2012-12-31 NOTE — L&D Delivery Note (Addendum)
Delivery Note At 1:29 PM a viable female was delivered via Vaginal, Spontaneous Delivery (Presentation: ROA ;  ).  APGAR: 6, 9; weight 8lbs 10oz.   Placenta status: Intact, Spontaneous.  Cord: 3 vessels with the following complications: shoulder dystocia less than a minute, McRoberts, Suprapubic pressure and [place] with success.  Cord pH: n/a  Anesthesia: Epidural  Episiotomy: None Lacerations: Periurethral and small vaginal Suture Repair: 4.0 monocryl Est. Blood Loss (mL): 300  Mom to postpartum.  Baby to nursery-stable.  Junius Faucett Y 10/19/2013, 1:48 PM

## 2013-02-16 ENCOUNTER — Encounter: Payer: Self-pay | Admitting: Obstetrics and Gynecology

## 2013-02-16 ENCOUNTER — Ambulatory Visit: Payer: 59 | Admitting: Obstetrics and Gynecology

## 2013-02-16 ENCOUNTER — Telehealth: Payer: Self-pay | Admitting: Obstetrics and Gynecology

## 2013-02-16 VITALS — BP 130/88 | Resp 18 | Wt 189.0 lb

## 2013-02-16 DIAGNOSIS — N840 Polyp of corpus uteri: Secondary | ICD-10-CM | POA: Insufficient documentation

## 2013-02-16 DIAGNOSIS — J45909 Unspecified asthma, uncomplicated: Secondary | ICD-10-CM

## 2013-02-16 DIAGNOSIS — G43909 Migraine, unspecified, not intractable, without status migrainosus: Secondary | ICD-10-CM | POA: Insufficient documentation

## 2013-02-16 DIAGNOSIS — J4552 Severe persistent asthma with status asthmaticus: Secondary | ICD-10-CM | POA: Insufficient documentation

## 2013-02-16 DIAGNOSIS — Z3200 Encounter for pregnancy test, result unknown: Secondary | ICD-10-CM

## 2013-02-16 DIAGNOSIS — D573 Sickle-cell trait: Secondary | ICD-10-CM | POA: Insufficient documentation

## 2013-02-16 DIAGNOSIS — N939 Abnormal uterine and vaginal bleeding, unspecified: Secondary | ICD-10-CM

## 2013-02-16 DIAGNOSIS — Z113 Encounter for screening for infections with a predominantly sexual mode of transmission: Secondary | ICD-10-CM

## 2013-02-16 DIAGNOSIS — Z331 Pregnant state, incidental: Secondary | ICD-10-CM

## 2013-02-16 LAB — POCT WET PREP (WET MOUNT)
Clue Cells Wet Prep Whiff POC: NEGATIVE
Trichomonas Wet Prep HPF POC: NEGATIVE

## 2013-02-16 LAB — POCT URINALYSIS DIPSTICK
Bilirubin, UA: NEGATIVE
Blood, UA: NEGATIVE
Glucose, UA: NEGATIVE
Ketones, UA: 2
Spec Grav, UA: 1.015
Urobilinogen, UA: 4

## 2013-02-16 NOTE — Telephone Encounter (Signed)
Pt was given an appt today with Dr. Su Hilt.  Positive pregnancy test with unusual discharge. Melissa [name]

## 2013-02-16 NOTE — Progress Notes (Signed)
Here s/p +UPT and d/--LMP 01/13/13, but was late. No pain or frank bleeding, but d/c has been pink. Pregnancy unplanned, but OK. D/C watery, no significant odor or irritation. Thinks blood type is O, but not sure. No previous blood type on file.  PE: Abdomen soft, NT Pelvic--small amount thin, white d/c in vault. Cervix NT, no evidence of bleeding. Uterus small, NT. Adnexa NT, no masses.  Wet prep negative.  Plan: Check QHCG and blood type (prenatal panel drawn, except for CBC). GC, chlamydia today. Will f/u with patient regarding result and f/u plan.  Nigel Bridgeman, CNM

## 2013-02-16 NOTE — Progress Notes (Signed)
Vaginal discharge: pink and watery Itching / Burning: no Fever: no  Symptoms have been present for spotting on and off for 2 wks. Has used over-the-counter treatment: no Associated symptoms:  Pelvic pain: yes       Dyspareunia: no     Odor:  no  History of STD:  Not that she knows of  STD screen:declined

## 2013-02-17 ENCOUNTER — Telehealth: Payer: Self-pay | Admitting: Obstetrics and Gynecology

## 2013-02-17 LAB — ABO

## 2013-02-17 LAB — GC/CHLAMYDIA PROBE AMP
CT Probe RNA: POSITIVE — AB
GC Probe RNA: NEGATIVE

## 2013-02-17 LAB — HCG, QUANTITATIVE, PREGNANCY: hCG, Beta Chain, Quant, S: 25835.6 m[IU]/mL

## 2013-02-17 NOTE — Telephone Encounter (Signed)
Message copied by Mason Jim on Tue Feb 17, 2013  8:12 AM ------      Message from: Cornelius Moras      Created: Tue Feb 17, 2013  5:53 AM       Please notify patient of [facility] results and blood type.      Schedule [place] within the next week, if possible, for dating, due to abnormal LMP.      Thanks!      VL ------

## 2013-02-17 NOTE — Telephone Encounter (Signed)
Message copied by Mason Jim on Tue Feb 17, 2013  8:13 AM ------      Message from: Cornelius Moras      Created: Tue Feb 17, 2013  5:53 AM       Please notify patient of [facility] results and blood type.      Schedule [place] within the next week, if possible, for dating, due to abnormal LMP.      Thanks!      VL ------

## 2013-02-17 NOTE — Telephone Encounter (Signed)
LM to return call.

## 2013-02-18 ENCOUNTER — Encounter: Payer: Self-pay | Admitting: Obstetrics and Gynecology

## 2013-02-18 DIAGNOSIS — O98819 Other maternal infectious and parasitic diseases complicating pregnancy, unspecified trimester: Secondary | ICD-10-CM

## 2013-02-18 DIAGNOSIS — A749 Chlamydial infection, unspecified: Secondary | ICD-10-CM

## 2013-02-18 LAB — CULTURE, OB URINE
Colony Count: NO GROWTH
Organism ID, Bacteria: NO GROWTH

## 2013-02-19 ENCOUNTER — Telehealth: Payer: Self-pay

## 2013-02-19 NOTE — Telephone Encounter (Signed)
LM for pt to cb 

## 2013-02-19 NOTE — Telephone Encounter (Signed)
Message copied by Janeece Agee on Thu Feb 19, 2013  4:49 PM ------      Message from: Cornelius Moras      Created: Wed Feb 18, 2013  6:00 PM       Please call patient and advise of results.      Rx Zithromax 1 gm dose, no refills for patient and partner, if patient desires.      Notify [facility].      Plan TOC in 3 weeks if OB patient.            Thanks!      VL ------

## 2013-02-23 ENCOUNTER — Telehealth: Payer: Self-pay | Admitting: Obstetrics and Gynecology

## 2013-02-23 NOTE — Telephone Encounter (Signed)
TC from pt. States was seen at ER in [place] today for bleeding. States no further bleeding. Is having some back pain but feels it is due from not resting. States has U/S result documenting 6wk 6 day IUP with +cardiac activity. Suggested pt take Tylenol.  To call if back pain continues, recurrence of bleeding, cramping or any concerns.  Otherwise to keep appt 02/26/13 for NOB interview. Pt verbalizes comprehension.

## 2013-02-25 ENCOUNTER — Telehealth: Payer: Self-pay | Admitting: Obstetrics and Gynecology

## 2013-02-25 ENCOUNTER — Other Ambulatory Visit: Payer: Self-pay

## 2013-02-25 DIAGNOSIS — Z3687 Encounter for antenatal screening for uncertain dates: Secondary | ICD-10-CM

## 2013-02-25 NOTE — Telephone Encounter (Signed)
[facility] fax sent

## 2013-02-25 NOTE — Telephone Encounter (Signed)
Spoke with pt informing her +CT. Zithromax 1gm no rf sent to verified pharmacy [facility] via telephone. Pt unsure at the moment whether or not she wants her partner treated. Pt has NOB interview tomorrow and will let us know then. TOC is needed 3 wks from 02/16/13. Pt will schedule TOC appt during NOB interview visit as well.

## 2013-02-25 NOTE — Telephone Encounter (Signed)
TC from Georgeanna Harrison nurse at [facility] [phone]. Pt was seen in ER and received + chlamydia results today. Has appt at this office 02/26/13 and requested result be addressed. Per note by TG, pt was called today and Rx given.

## 2013-02-26 ENCOUNTER — Ambulatory Visit: Payer: 59

## 2013-02-26 ENCOUNTER — Ambulatory Visit: Payer: 59 | Admitting: Obstetrics and Gynecology

## 2013-02-26 ENCOUNTER — Other Ambulatory Visit: Payer: Self-pay | Admitting: Obstetrics and Gynecology

## 2013-02-26 ENCOUNTER — Encounter: Payer: Self-pay | Admitting: Obstetrics and Gynecology

## 2013-02-26 DIAGNOSIS — Z331 Pregnant state, incidental: Secondary | ICD-10-CM

## 2013-02-26 LAB — US OB TRANSVAGINAL

## 2013-02-26 NOTE — Progress Notes (Signed)
[redacted]w[redacted]d Dating Ultrasound: singleton pregnancy, [redacted]w[redacted]d IUP, yolk sac seen, normal ovaries, no fluid in CDS, normal adenexas.

## 2013-02-26 NOTE — Progress Notes (Signed)
Here for f/u US for viability. Reassured regarding status of pregnancy, with viable IUP at 7 1/7 weeks, [facility] 10/14/13. Considering waterbirth--issues and preparation reviewed.  Needs to attend class. Needs TOC chlamydia at NOB w/u. Schedule NOB w/u in 3 weeks.

## 2013-02-27 NOTE — Progress Notes (Signed)
NOB interview completed.  PNV samples given.  Pt has viability [place] today after interview for dating.  NOB workup scheduled for Friday 03/20/13 w/ CA @0900 .

## 2013-03-10 ENCOUNTER — Telehealth: Payer: Self-pay | Admitting: Obstetrics and Gynecology

## 2013-03-10 NOTE — Telephone Encounter (Signed)
TC from pt. Questioning if has any problem with placenta as had previa last pregnancy. Informed no specific mention of placenta on U/S report. Had bleeding episode 4 weeks ago. States is very nervous about having IC. Has appt 03/20/13. Informed will be able to hear FHT at that time. May want to wait to have IC until that time if would be more reassuring. Pt verbalizes comprehension.

## 2013-03-10 NOTE — Telephone Encounter (Signed)
Returned pt's call. LM to return call.   

## 2013-03-12 ENCOUNTER — Telehealth: Payer: Self-pay | Admitting: Obstetrics and Gynecology

## 2013-03-12 NOTE — Telephone Encounter (Signed)
TC from pt. States is having lower abd/groin pain that is aggravated with sudden change in position, coughing and then is relieved. Discussed round ligament pain. Advised increase water to 8-10 glasses/day. May take Tylenol, try warm bath. Also states has had dizziness  On and off since 03/11/13. Is eating q 2-3 hours but not having protein snacks. Advised to include protein with snacks.  To call if either sx does not improve or with other concerns.  Pt verbalizes comprehension.

## 2013-03-13 ENCOUNTER — Telehealth: Payer: Self-pay | Admitting: Obstetrics and Gynecology

## 2013-03-13 NOTE — Telephone Encounter (Signed)
TC from pt. States her toddler ran into her this am and is now bleeding. Is out of town and currently being seen at hospital in [place]. To call with status. Hospital may call if needs any records. Pt verbalizes comprehension.

## 2013-03-18 ENCOUNTER — Encounter: Payer: Self-pay | Admitting: Certified Nurse Midwife

## 2013-03-18 ENCOUNTER — Other Ambulatory Visit: Payer: 59

## 2013-03-18 ENCOUNTER — Ambulatory Visit: Payer: 59 | Admitting: Certified Nurse Midwife

## 2013-03-18 VITALS — BP 112/60 | Wt 184.0 lb

## 2013-03-18 DIAGNOSIS — O200 Threatened abortion: Secondary | ICD-10-CM

## 2013-03-18 DIAGNOSIS — Z331 Pregnant state, incidental: Secondary | ICD-10-CM

## 2013-03-18 NOTE — Progress Notes (Signed)
[redacted]w[redacted]d  Pt has been bleeding since last Friday Pt had U/S today

## 2013-03-18 NOTE — Progress Notes (Signed)
Pt. States she starting bleeding again since last Friday. Mini pad in past 3 hours. No clots passed. No pain.  US done and indicates a viable fetus. EDC 10-14-2013.   Pt. Also was treated for chlamydia in Feb. 2014.  States partner also treated. Exam-Speculum-scant pink d/c without clots.  Cx. Pink without lesions. Bimanual- Uterus enlarged   To 10 wk size, adnexa normal. No CMT. Hulen Luster, CNM

## 2013-03-19 LAB — PAP IG W/ RFLX HPV ASCU

## 2013-04-09 ENCOUNTER — Encounter (HOSPITAL_COMMUNITY): Payer: Self-pay

## 2013-04-09 ENCOUNTER — Inpatient Hospital Stay (HOSPITAL_COMMUNITY)
Admission: AD | Admit: 2013-04-09 | Discharge: 2013-04-09 | Disposition: A | Discharge status: Home / Self Care | Payer: 59 | Source: Ambulatory Visit | Attending: Obstetrics and Gynecology | Admitting: Obstetrics and Gynecology

## 2013-04-09 DIAGNOSIS — O219 Vomiting of pregnancy, unspecified: Secondary | ICD-10-CM

## 2013-04-09 DIAGNOSIS — R109 Unspecified abdominal pain: Secondary | ICD-10-CM | POA: Insufficient documentation

## 2013-04-09 DIAGNOSIS — R51 Headache: Secondary | ICD-10-CM

## 2013-04-09 DIAGNOSIS — O99891 Other specified diseases and conditions complicating pregnancy: Secondary | ICD-10-CM | POA: Insufficient documentation

## 2013-04-09 DIAGNOSIS — O210 Mild hyperemesis gravidarum: Secondary | ICD-10-CM | POA: Insufficient documentation

## 2013-04-09 DIAGNOSIS — Z8673 Personal history of transient ischemic attack (TIA), and cerebral infarction without residual deficits: Secondary | ICD-10-CM

## 2013-04-09 HISTORY — DX: Personal history of transient ischemic attack (TIA), and cerebral infarction without residual deficits: Z86.73

## 2013-04-09 HISTORY — DX: Headache: R51

## 2013-04-09 LAB — URINALYSIS, ROUTINE W REFLEX MICROSCOPIC
Ketones, ur: NEGATIVE mg/dL
Nitrite: NEGATIVE
Protein, ur: NEGATIVE mg/dL
Urobilinogen, UA: 0.2 mg/dL (ref 0.0–1.0)

## 2013-04-09 MED ORDER — BUTORPHANOL TARTRATE 1 MG/ML IJ SOLN
2.0000 mg | Freq: Once | INTRAMUSCULAR | Status: AC
  Administered 2013-04-09: 2 mg via INTRAVENOUS
  Filled 2013-04-09: qty 2

## 2013-04-09 MED ORDER — ONDANSETRON 8 MG PO TBDP: 8.0000 mg | ORAL_TABLET | Freq: Three times a day (TID) | ORAL | Status: DC | PRN

## 2013-04-09 MED ORDER — ONDANSETRON HCL 4 MG/2ML IJ SOLN
4.0000 mg | Freq: Once | INTRAMUSCULAR | Status: AC
  Administered 2013-04-09: 4 mg via INTRAVENOUS
  Filled 2013-04-09: qty 2

## 2013-04-09 MED ORDER — PROMETHAZINE HCL 25 MG PO TABS: 25.0000 mg | ORAL_TABLET | Freq: Four times a day (QID) | ORAL | Status: DC | PRN

## 2013-04-09 MED ORDER — LACTATED RINGERS IV BOLUS (SEPSIS)
1000.0000 mL | Freq: Once | INTRAVENOUS | Status: AC
  Administered 2013-04-09: 1000 mL via INTRAVENOUS

## 2013-04-09 MED ORDER — PANTOPRAZOLE SODIUM 40 MG IV SOLR
40.0000 mg | Freq: Once | INTRAVENOUS | Status: AC
  Administered 2013-04-09: 40 mg via INTRAVENOUS
  Filled 2013-04-09: qty 40

## 2013-04-09 NOTE — MAU Provider Note (Signed)
History   Melissa [name] is a 26y.o. BF who presents at [redacted]w[redacted]d unannounced w/ CC of N/v and headache throughout the day.  She reports headache is on top of head and frontal/temporal areas. Denies sinus pressure or pain.  No recent fever.  No resp c/o's.  Normal BM's.  Has thrown up 5-6 times total today, but sometimes intake stays down for 1-2 hrs.  No one is sick around her.  She has a 4y.o. Daughter.  No change in diet or recent traveling.  Some cramping in lower abdomen, but only present as day has progressed.  No VB, LOF, or abnl d/c.  No UTI s/s.  Pt works at [employer] time."  Pt reports n/v much worse earlier in pregnancy.  Last emesis episodes were last week, prior to today.  Pt's problem list did not reflect h/o presumptive CVA 2 yrs ago.  Pt is unsure if blot or bleed.  Has residual "chronic nerve pain" occasionally on Lt side, esp arm, hands.  Followed by [facility] neurology, and was originally Rx'd daily "Hydrocodone and Mobic." Pt currently off meds for some time; had also initially been followed by a pain clinic.    Pt is not having any difficulty speaking, no unilateral weakness, or other s/s of stroke tonight.   .. Patient Active Problem List  Diagnosis  . Hx Endometrial polyp  . Sickle cell trait  . Asthma  . Migraines  . Chlamydia infection, current pregnancy  . PPH (postpartum hemorrhage)  . History of CVA (cerebrovascular accident)    CSN: [id number]  Arrival date and time: 04/09/13 2030   None     Chief Complaint  Patient presents with  . Headache  . Emesis During Pregnancy  . Abdominal Cramping   HPI  OB History   Grav Para Term Preterm Abortions TAB SAB Ect Mult Living   2 1 1       1       Past Medical History  Diagnosis Date  . Placenta previa antepartum 2010    Placenta moved 3 days before baby was born  . Abnormal Pap smear 2010    Repeat pap done;Hysteroscopy;Last pap 2012;was normal  . Infection     BV;may get frequently  . Anemia 2010    Iron  supplements PP  . Asthma     Has a nebulizer;triggered by stress  . Stroke 2013    MD thought may have had a stroke and heart attack;has neurologist @ [facility]  . Heart attack     MD thinks may have had a heart attack;Cardiologist @ [facility]  . Nerve pain     Chronic;d/t possible stroke and MI  . Sickle cell trait   . Headache     Past Surgical History  Procedure Laterality Date  . Hysteroscopy  2010    Family History  Problem Relation Age of Onset  . Sickle cell trait Father   . Hypertension Mother   . Cervical cancer Paternal Aunt   . Cervical cancer Paternal Grandmother   . Anemia Mother   . Diabetes Paternal Grandmother   . Diabetes Paternal Grandfather   . Diabetes Paternal Aunt   . Asthma Brother   . Asthma Daughter   . Asthma Mother     History  Substance Use Topics  . Smoking status: Never Smoker   . Smokeless tobacco: Never Used  . Alcohol Use: Yes     Comment: socially;prior to pregnancy    Allergies:  Allergies  Allergen Reactions  .  Ginger Swelling    Prescriptions prior to admission  Medication Sig Dispense Refill  . acetaminophen (TYLENOL) 325 MG tablet Take 650 mg by mouth every 6 (six) hours as needed for pain.      [name] albuterol (PROVENTIL HFA;VENTOLIN HFA) 108 (90 BASE) MCG/ACT inhaler Inhale 2 puffs into the lungs every 6 (six) hours as needed for wheezing or shortness of breath.      [name] albuterol (PROVENTIL) (5 MG/ML) 0.5% nebulizer solution Take 2.5 mg by nebulization daily as needed (For chest tightness.).      [name] Prenatal Vit-Fe Fumarate-FA (PRENATAL MULTIVITAMIN) TABS Take 1 tablet by mouth daily at 12 noon.        ROS--see HPIV Physical Exam   Blood pressure 112/87, pulse 87, temperature 98.2 F (36.8 C), temperature source Oral, resp. rate 16, height 5\' 4"  (1.626 m), weight 186 lb 6.4 oz (84.55 kg), last menstrual period 01/13/2013, SpO2 99.00%.  Physical Exam  Constitutional: She is oriented to person, place, and time. She  appears well-developed and well-nourished.  Some grimace; no guarding;   HENT:  Head: Normocephalic and atraumatic.  Eyes: Pupils are equal, round, and reactive to light.  Cardiovascular: Normal rate.   Respiratory: Effort normal.  GI: Soft. She exhibits no distension and no mass. There is no tenderness. There is no rebound and no guarding.  Genitourinary:  Pelvic deferred  Neurological: She is alert and oriented to person, place, and time.  Skin: Skin is warm and dry.  Psychiatric: She has a normal mood and affect. Her behavior is normal. Judgment and thought content normal.  .. Results for orders placed during the hospital encounter of 04/09/13 (from the past 48 hour(s))  URINALYSIS, ROUTINE W REFLEX MICROSCOPIC     Status: Abnormal   Collection Time    04/09/13  8:48 PM      Result Value Range   Color, Urine YELLOW  YELLOW   APPearance CLEAR  CLEAR   Specific Gravity, Urine <1.005 (*) 1.005 - 1.030   pH 6.5  5.0 - 8.0   Glucose, UA NEGATIVE  NEGATIVE mg/dL   Hgb urine dipstick NEGATIVE  NEGATIVE   Bilirubin Urine NEGATIVE  NEGATIVE   Ketones, ur NEGATIVE  NEGATIVE mg/dL   Protein, ur NEGATIVE  NEGATIVE mg/dL   Urobilinogen, UA 0.2  0.0 - 1.0 mg/dL   Nitrite NEGATIVE  NEGATIVE   Leukocytes, UA MODERATE (*) NEGATIVE  URINE MICROSCOPIC-ADD ON     Status: None   Collection Time    04/09/13  8:48 PM      Result Value Range   Squamous Epithelial / LPF RARE  RARE   WBC, UA 0-2  <3 WBC/hpf   RBC / HPF 0-2  <3 RBC/hpf    MAU Course  Procedures 1. FHT's per handheld doppler 2. One liter LR 3. Protonix 40mg  IV x1 4. Zofran 4mg  IV x1 5. Stadol 2mg  IV x1 6. NPO Assessment and Plan  1. [redacted]w[redacted]d 2. Virus vs n/v of pregnancy 3. Headache likely r/t purging 4. H/o presumptive stroke w/ chronic nerve pain 5. H/o migraines  1. D/c'd home after above treatments w/ good relief of headache 2. Pt to remain NPO remainder of night and try Brat diet tomorrow AM 3. Rx's given for  Phenergan and Zofran po, prn 4. F/u 04/29/13, or prn at [facility] 5. Call if s/s worsens or other concerns prn Elzie Sheets H 04/09/2013, 9:55 PM

## 2013-04-09 NOTE — MAU Note (Signed)
Pt states headache and n/v began this morning. Took tylenol, but states that it has not helped. Has some abdominal cramping but pt believes it is from vomiting.

## 2013-05-25 ENCOUNTER — Encounter (HOSPITAL_COMMUNITY): Payer: Self-pay | Admitting: *Deleted

## 2013-05-25 ENCOUNTER — Inpatient Hospital Stay (HOSPITAL_COMMUNITY)
Admission: AD | Admit: 2013-05-25 | Discharge: 2013-05-25 | Disposition: A | Discharge status: Home / Self Care | Payer: 59 | Source: Ambulatory Visit | Attending: Obstetrics and Gynecology | Admitting: Obstetrics and Gynecology

## 2013-05-25 DIAGNOSIS — N949 Unspecified condition associated with female genital organs and menstrual cycle: Secondary | ICD-10-CM

## 2013-05-25 DIAGNOSIS — R109 Unspecified abdominal pain: Secondary | ICD-10-CM | POA: Insufficient documentation

## 2013-05-25 DIAGNOSIS — O99891 Other specified diseases and conditions complicating pregnancy: Secondary | ICD-10-CM | POA: Insufficient documentation

## 2013-05-25 LAB — URINALYSIS, ROUTINE W REFLEX MICROSCOPIC
Bilirubin Urine: NEGATIVE
Glucose, UA: NEGATIVE mg/dL
Hgb urine dipstick: NEGATIVE
Ketones, ur: NEGATIVE mg/dL
Leukocytes, UA: NEGATIVE
Nitrite: NEGATIVE
Protein, ur: NEGATIVE mg/dL
Specific Gravity, Urine: >1.03 — ABNORMAL HIGH (ref 1.005–1.030)
Urobilinogen, UA: 0.2 mg/dL (ref 0.0–1.0)
pH: 6 (ref 5.0–8.0)

## 2013-05-25 LAB — WET PREP, GENITAL
Trich, Wet Prep: NONE SEEN
Yeast Wet Prep HPF POC: NONE SEEN

## 2013-05-25 NOTE — MAU Note (Signed)
Patient states she started having some cramping on 5-21 that continues and now having lower abdominal pressure. Denies bleeding, leaking or vaginal discharge. Patient reports she has felt fetal movement.

## 2013-05-25 NOTE — MAU Note (Signed)
History   27 yo, G2P1001 at [redacted]w[redacted]d presented after calling for cramping and pelvic pressure.  Denies VB but reports occasional spotting throughout pregnancy.  Denies recent fever, resp or GI c/o's, UTI or PIH s/s. GFM.   Chief Complaint  Patient presents with  . Abdominal Pain    OB History   Grav Para Term Preterm Abortions TAB SAB Ect Mult Living   2 1 1       1       Past Medical History  Diagnosis Date  . Placenta previa antepartum 2010    Placenta moved 3 days before baby was born  . Abnormal Pap smear 2010    Repeat pap done;Hysteroscopy;Last pap 2012;was normal  . Infection     BV;may get frequently  . Anemia 2010    Iron supplements PP  . Asthma     Has a nebulizer;triggered by stress  . Stroke 2013    MD thought may have had a stroke and heart attack;has neurologist @ [facility]  . Heart attack     MD thinks may have had a heart attack;Cardiologist @ [facility]  . Nerve pain     Chronic;d/t possible stroke and MI  . Sickle cell trait   . Headache   . History of CVA (cerebrovascular accident) 04/09/2013    Pt reports presumptive "stroke" when she was 27y.o.; followed by [facility] neurology for "chronic nerve pain" and has been Rx'd Hydrocodone and Mobic    Past Surgical History  Procedure Laterality Date  . Hysteroscopy  2010    Family History  Problem Relation Age of Onset  . Sickle cell trait Father   . Hypertension Mother   . Cervical cancer Paternal Aunt   . Cervical cancer Paternal Grandmother   . Anemia Mother   . Diabetes Paternal Grandmother   . Diabetes Paternal Grandfather   . Diabetes Paternal Aunt   . Asthma Brother   . Asthma Daughter   . Asthma Mother     History  Substance Use Topics  . Smoking status: Never Smoker   . Smokeless tobacco: Never Used  . Alcohol Use: Yes     Comment: socially;prior to pregnancy    Allergies:  Allergies  Allergen Reactions  . Ginger Swelling    Prescriptions prior to admission  Medication  Sig Dispense Refill  . acetaminophen (TYLENOL) 325 MG tablet Take 650 mg by mouth every 6 (six) hours as needed for pain.      [name] albuterol (PROVENTIL) (2.5 MG/3ML) 0.083% nebulizer solution Take 2.5 mg by nebulization every 6 (six) hours as needed (asthma).      . Prenatal Vit-Fe Fumarate-FA (PRENATAL MULTIVITAMIN) TABS Take 1 tablet by mouth daily at 12 noon.      [name] albuterol (PROVENTIL HFA;VENTOLIN HFA) 108 (90 BASE) MCG/ACT inhaler Inhale 2 puffs into the lungs every 6 (six) hours as needed for wheezing or shortness of breath.      . ondansetron (ZOFRAN ODT) 8 MG disintegrating tablet Take 1 tablet (8 mg total) by mouth every 8 (eight) hours as needed for nausea.  20 tablet  1  . promethazine (PHENERGAN) 25 MG tablet Take 1 tablet (25 mg total) by mouth every 6 (six) hours as needed for nausea.  20 tablet  1    ROS: see HPI above, all other systems are negative.  Physical Exam   Blood pressure 111/64, pulse 105, temperature 98.6 F (37 C), temperature source Oral, resp. rate 16, height 5\' 4"  (1.626 m), weight  186 lb 3.2 oz (84.46 kg), last menstrual period 01/13/2013, SpO2 100.00%.  Chest: Clear Heart: RRR Abdomen: gravid, NT Extremities: WNL  Pelvic: Dilation: Closed Effacement (%): Thick Station:  (high) Exam by:: J. Kani Chauvin CNM  FHT: Doppler 156 UCs: none  ED Course  IUP at [redacted]w[redacted]d Pressure and cramping  Wet prep D/c home with PTL precautions Motrin 600 mg Q 6 hr x 2 days Encouraged to call if symptoms worsen Will call pt with results of wet prep   Haroldine Laws CNM, MSN 05/25/2013 6:38 PM

## 2013-06-10 ENCOUNTER — Inpatient Hospital Stay (HOSPITAL_COMMUNITY)
Admission: AD | Admit: 2013-06-10 | Discharge: 2013-06-10 | Disposition: A | Discharge status: Home / Self Care | Payer: 59 | Source: Ambulatory Visit | Attending: Obstetrics and Gynecology | Admitting: Obstetrics and Gynecology

## 2013-06-10 ENCOUNTER — Encounter (HOSPITAL_COMMUNITY): Payer: Self-pay | Admitting: *Deleted

## 2013-06-10 ENCOUNTER — Other Ambulatory Visit (HOSPITAL_COMMUNITY): Payer: Self-pay | Admitting: Obstetrics and Gynecology

## 2013-06-10 DIAGNOSIS — O4700 False labor before 37 completed weeks of gestation, unspecified trimester: Secondary | ICD-10-CM | POA: Insufficient documentation

## 2013-06-10 DIAGNOSIS — R109 Unspecified abdominal pain: Secondary | ICD-10-CM | POA: Insufficient documentation

## 2013-06-10 DIAGNOSIS — O4702 False labor before 37 completed weeks of gestation, second trimester: Secondary | ICD-10-CM

## 2013-06-10 LAB — URINALYSIS, ROUTINE W REFLEX MICROSCOPIC
Bilirubin Urine: NEGATIVE
Ketones, ur: NEGATIVE mg/dL
Nitrite: NEGATIVE
Specific Gravity, Urine: <1.005 — ABNORMAL LOW (ref 1.005–1.030)
Urobilinogen, UA: 0.2 mg/dL (ref 0.0–1.0)

## 2013-06-10 LAB — URINE MICROSCOPIC-ADD ON

## 2013-06-10 MED ORDER — NIFEDIPINE 10 MG PO CAPS
10.0000 mg | ORAL_CAPSULE | Freq: Once | ORAL | Status: AC
  Administered 2013-06-10: 10 mg via ORAL
  Filled 2013-06-10: qty 1

## 2013-06-10 MED ORDER — LACTATED RINGERS IV SOLN
INTRAVENOUS | Status: AC
  Administered 2013-06-10: 14:00:00 via INTRAVENOUS

## 2013-06-10 NOTE — MAU Note (Signed)
Cramping and contractions X 1 week. No bleeding or loss of fluid.

## 2013-06-23 ENCOUNTER — Inpatient Hospital Stay (HOSPITAL_COMMUNITY)
Admission: AD | Admit: 2013-06-23 | Discharge: 2013-06-23 | Disposition: A | Discharge status: Home / Self Care | Payer: 59 | Source: Ambulatory Visit | Attending: Obstetrics and Gynecology | Admitting: Obstetrics and Gynecology

## 2013-06-23 ENCOUNTER — Encounter (HOSPITAL_COMMUNITY): Payer: Self-pay | Admitting: *Deleted

## 2013-06-23 DIAGNOSIS — R109 Unspecified abdominal pain: Secondary | ICD-10-CM | POA: Insufficient documentation

## 2013-06-23 DIAGNOSIS — O4700 False labor before 37 completed weeks of gestation, unspecified trimester: Secondary | ICD-10-CM | POA: Insufficient documentation

## 2013-06-23 LAB — URINE MICROSCOPIC-ADD ON

## 2013-06-23 LAB — URINALYSIS, ROUTINE W REFLEX MICROSCOPIC
Bilirubin Urine: NEGATIVE
Glucose, UA: NEGATIVE mg/dL
Ketones, ur: NEGATIVE mg/dL
pH: 7 (ref 5.0–8.0)

## 2013-06-23 LAB — FETAL FIBRONECTIN: Fetal Fibronectin: NEGATIVE

## 2013-06-23 NOTE — MAU Note (Signed)
Patient states she has been treated for PTL. States she has been having contractions, denies bleeding or leaking. Reports no fetal movement in a couple of hours.

## 2013-06-23 NOTE — MAU Note (Signed)
C/o ucs since 1500 this afternoon; 

## 2013-06-23 NOTE — MAU Provider Note (Signed)
History   27 yo G2P1001 at 59 6/7 weeks presented after calling office with contractions today and pelvic pressure--had worked today, and had nausea today, no vomiting.  Denies leaking or bleeding, reports +FM.  Denies dysuria or discharge.  Has had persistent issues with cramping since early pregnancy.  Has been on Ibuprophen for cramping and Phenergan for nausea.  Cultures negative 02/2013.  Patient Active Problem List   Diagnosis Date Noted  . History of CVA (cerebrovascular accident) 04/09/2013  . PPH (postpartum hemorrhage) 02/26/2013  . Chlamydia infection, current pregnancy 02/18/2013  . Hx Endometrial polyp 02/16/2013  . Sickle cell trait 02/16/2013  . Asthma 02/16/2013  . Migraines 02/16/2013  Hx ? CVA per patient report, but negative MRI of brain in 2013 and negative CTs in past.   Chief Complaint  Patient presents with  . Abdominal Pain     OB History   Grav Para Term Preterm Abortions TAB SAB Ect Mult Living   2 1 1       1       Past Medical History  Diagnosis Date  . Placenta previa antepartum 2010    Placenta moved 3 days before baby was born  . Abnormal Pap smear 2010    Repeat pap done;Hysteroscopy;Last pap 2012;was normal  . Infection     BV;may get frequently  . Anemia 2010    Iron supplements PP  . Asthma     Has a nebulizer;triggered by stress  . Stroke 2013    MD thought may have had a stroke and heart attack;has neurologist @ [facility]  . Heart attack     MD thinks may have had a heart attack;Cardiologist @ [facility]  . Nerve pain     Chronic;d/t possible stroke and MI  . Sickle cell trait   . Headache(784.0)   . History of CVA (cerebrovascular accident) 04/09/2013    Pt reports presumptive "stroke" when she was 27y.o.; followed by [facility] neurology for "chronic nerve pain" and has been Rx'd Hydrocodone and Mobic    Past Surgical History  Procedure Laterality Date  . Hysteroscopy  2010    Family History  Problem Relation Age of  Onset  . Sickle cell trait Father   . Hypertension Mother   . Cervical cancer Paternal Aunt   . Cervical cancer Paternal Grandmother   . Anemia Mother   . Diabetes Paternal Grandmother   . Diabetes Paternal Grandfather   . Diabetes Paternal Aunt   . Asthma Brother   . Asthma Daughter   . Asthma Mother     History  Substance Use Topics  . Smoking status: Never Smoker   . Smokeless tobacco: Never Used  . Alcohol Use: Yes     Comment: socially;prior to pregnancy    Allergies:  Allergies  Allergen Reactions  . Ginger Swelling    Prescriptions prior to admission  Medication Sig Dispense Refill  . acetaminophen (TYLENOL) 325 MG tablet Take 650 mg by mouth every 6 (six) hours as needed for pain.      [name] HYDROcodone-acetaminophen (NORCO/VICODIN) 5-325 MG per tablet Take 1 tablet by mouth every 6 (six) hours as needed for pain.      [name] ibuprofen (ADVIL,MOTRIN) 200 MG tablet Take 600 mg by mouth every 6 (six) hours as needed for pain.      . Prenatal Vit-Fe Fumarate-FA (PRENATAL MULTIVITAMIN) TABS Take 1 tablet by mouth daily at 12 noon.      . promethazine (PHENERGAN) 25 MG tablet Take  1 tablet (25 mg total) by mouth every 6 (six) hours as needed for nausea.  20 tablet  1  . albuterol (PROVENTIL HFA;VENTOLIN HFA) 108 (90 BASE) MCG/ACT inhaler Inhale 2 puffs into the lungs every 6 (six) hours as needed for wheezing or shortness of breath.      [name] albuterol (PROVENTIL) (2.5 MG/3ML) 0.083% nebulizer solution Take 2.5 mg by nebulization every 6 (six) hours as needed (asthma).        Physical Exam   Blood pressure 121/76, pulse 95, temperature 98.6 F (37 C), temperature source Axillary, resp. rate 18, height 5\' 4"  (1.626 m), weight 191 lb 12.8 oz (87 kg), last menstrual period 01/13/2013, SpO2 100.00%.  Chest clear Heart RRR without murmur Abd gravid, NT Pelvic--cervix closed and long, posterior, no d/c. Ext WNL  FHR reassuring for EGA No defined contractions, ? Mild  irritability.  ED Course  IUP at 23 6/7 weeks Persistent cramping--no evidence PTL  Plan: Discussed possible use of FFN at this gestation (just prior to 24 weeks) for further assessment of implications of cramping.  If +, would give betamethasone course.  If negative, will serve as reassurance. FFN done--will await results. Declines Procardia at present. Has appt tomorrow--suggested patient ask re:  Diclegis samples at that visit.   Nigel Bridgeman CNM, [place] 06/23/2013 6:54 PM  Addendum: FFN negative. Patient reassured. D/C'd home with instructions for PTL monitoring. Keep scheduled appt at [facility] tomorrow.  Nigel Bridgeman, CNM 06/23/13 7:30p

## 2013-07-27 ENCOUNTER — Inpatient Hospital Stay (HOSPITAL_COMMUNITY): Payer: 59

## 2013-07-27 ENCOUNTER — Inpatient Hospital Stay (HOSPITAL_COMMUNITY)
Admission: AD | Admit: 2013-07-27 | Discharge: 2013-07-28 | Disposition: A | Discharge status: Home / Self Care | Payer: 59 | Source: Ambulatory Visit | Attending: Obstetrics and Gynecology | Admitting: Obstetrics and Gynecology

## 2013-07-27 ENCOUNTER — Encounter (HOSPITAL_COMMUNITY): Payer: Self-pay | Admitting: *Deleted

## 2013-07-27 DIAGNOSIS — M79609 Pain in unspecified limb: Secondary | ICD-10-CM | POA: Insufficient documentation

## 2013-07-27 DIAGNOSIS — R262 Difficulty in walking, not elsewhere classified: Secondary | ICD-10-CM | POA: Insufficient documentation

## 2013-07-27 DIAGNOSIS — O99891 Other specified diseases and conditions complicating pregnancy: Secondary | ICD-10-CM | POA: Insufficient documentation

## 2013-07-27 DIAGNOSIS — R109 Unspecified abdominal pain: Secondary | ICD-10-CM | POA: Insufficient documentation

## 2013-07-27 DIAGNOSIS — Y92009 Unspecified place in unspecified non-institutional (private) residence as the place of occurrence of the external cause: Secondary | ICD-10-CM | POA: Insufficient documentation

## 2013-07-27 DIAGNOSIS — W108XXA Fall (on) (from) other stairs and steps, initial encounter: Secondary | ICD-10-CM | POA: Insufficient documentation

## 2013-07-27 LAB — COMPREHENSIVE METABOLIC PANEL
ALT: 11 U/L (ref 0–35)
Alkaline Phosphatase: 93 U/L (ref 39–117)
CO2: 22 mEq/L (ref 19–32)
Calcium: 9.3 mg/dL (ref 8.4–10.5)
GFR calc Af Amer: >90 mL/min (ref >90)
GFR calc non Af Amer: >90 mL/min (ref >90)
Glucose, Bld: 86 mg/dL (ref 70–99)
Sodium: 137 mEq/L (ref 135–145)
Total Bilirubin: 0.5 mg/dL (ref 0.3–1.2)

## 2013-07-27 LAB — URINALYSIS, ROUTINE W REFLEX MICROSCOPIC
Bilirubin Urine: NEGATIVE
Ketones, ur: NEGATIVE mg/dL
Nitrite: NEGATIVE
Nitrite: NEGATIVE
Protein, ur: NEGATIVE mg/dL
Specific Gravity, Urine: 1.02 (ref 1.005–1.030)
Urobilinogen, UA: 0.2 mg/dL (ref 0.0–1.0)
Urobilinogen, UA: 0.2 mg/dL (ref 0.0–1.0)

## 2013-07-27 LAB — URINE MICROSCOPIC-ADD ON

## 2013-07-27 LAB — CBC
Hemoglobin: 10.8 g/dL — ABNORMAL LOW (ref 12.0–15.0)
MCH: 28.1 pg (ref 26.0–34.0)
RBC: 3.84 MIL/uL — ABNORMAL LOW (ref 3.87–5.11)

## 2013-07-27 MED ORDER — BUTALBITAL-APAP-CAFFEINE 50-325-40 MG PO TABS
2.0000 | ORAL_TABLET | Freq: Once | ORAL | Status: AC
  Administered 2013-07-27: 2 via ORAL
  Filled 2013-07-27: qty 2

## 2013-07-27 MED ORDER — ONDANSETRON HCL 4 MG/2ML IJ SOLN
4.0000 mg | Freq: Once | INTRAMUSCULAR | Status: AC
  Administered 2013-07-27: 4 mg via INTRAVENOUS
  Filled 2013-07-27: qty 2

## 2013-07-27 MED ORDER — DEXTROSE 5 % IN LACTATED RINGERS IV BOLUS
500.0000 mL | Freq: Once | INTRAVENOUS | Status: AC
  Administered 2013-07-27: 500 mL via INTRAVENOUS

## 2013-07-27 MED ORDER — PROMETHAZINE HCL 25 MG/ML IJ SOLN
12.5000 mg | Freq: Once | INTRAMUSCULAR | Status: AC
  Administered 2013-07-27: 12.5 mg via INTRAVENOUS
  Filled 2013-07-27: qty 1

## 2013-07-27 NOTE — MAU Note (Signed)
PT SAYS    SHE FELL Sunday NIGHT AT 1030 -  COMING UP STAIRS-  FELL ONTO STAIRS-  CAUGHT HERSELF WITH HER HANDS AND LAYED THERE-   DID NOT HIT HER ABD.   SHE STARTED HURTING   BEFORE SHE FELL -  HAVING UC AND SHARP PAINS  IN LOWER  RIGHT SIDE -  THAT MOVED INTO LEG  WHEN GETTING UP FROM THE FALL.    LAST NIGHT NO SLEEP BECAUSE OF PAIN-  DID NOT CALL DR.  TOOK TYLENOL    AT 2300- 2 TABS.     TODAY CALLED DR -  NOT FEEL BETTER.

## 2013-07-27 NOTE — MAU Provider Note (Signed)
History     CSN: [id number]  Arrival date and time: 07/27/13 1932   First Provider Initiated Contact with Patient 07/27/13 2020      Chief Complaint  Patient presents with  . Abdominal Pain  . Leg Pain  . Headache  . Emesis  . Fall   HPI Comments: Pt is a G2P1 at [redacted]w[redacted]d that arrives unannounced w multiple c/o, including HA/N/V, and RLQ and R leg pain that started yesterday, pt states pain is constant, does not feel like ctx, denies any VB or LOF, +FM. Pt states she tripped over a baby toy and fell onto her hands, did not hit abdomen.   Abdominal Pain Associated symptoms include headaches and vomiting.  Leg Pain   Headache  Associated symptoms include abdominal pain and vomiting.  Emesis  Associated symptoms include abdominal pain and headaches.  Fall Associated symptoms include abdominal pain, headaches and vomiting.      Past Medical History  Diagnosis Date  . Placenta previa antepartum 2010    Placenta moved 3 days before baby was born  . Abnormal Pap smear 2010    Repeat pap done;Hysteroscopy;Last pap 2012;was normal  . Infection     BV;may get frequently  . Anemia 2010    Iron supplements PP  . Asthma     Has a nebulizer;triggered by stress  . Stroke 2013    MD thought may have had a stroke and heart attack;has neurologist @ [facility]  . Heart attack     MD thinks may have had a heart attack;Cardiologist @ [facility]  . Nerve pain     Chronic;d/t possible stroke and MI  . Sickle cell trait   . Headache(784.0)   . History of CVA (cerebrovascular accident) 04/09/2013    Pt reports presumptive "stroke" when she was 27y.o.; followed by [facility] neurology for "chronic nerve pain" and has been Rx'd Hydrocodone and Mobic    Past Surgical History  Procedure Laterality Date  . Hysteroscopy  2010    Family History  Problem Relation Age of Onset  . Sickle cell trait Father   . Hypertension Mother   . Cervical cancer Paternal Aunt   . Cervical cancer  Paternal Grandmother   . Anemia Mother   . Diabetes Paternal Grandmother   . Diabetes Paternal Grandfather   . Diabetes Paternal Aunt   . Asthma Brother   . Asthma Daughter   . Asthma Mother     History  Substance Use Topics  . Smoking status: Never Smoker   . Smokeless tobacco: Never Used  . Alcohol Use: Yes     Comment: socially;prior to pregnancy    Allergies:  Allergies  Allergen Reactions  . Ginger Swelling    Prescriptions prior to admission  Medication Sig Dispense Refill  . acetaminophen (TYLENOL) 325 MG tablet Take 650 mg by mouth every 6 (six) hours as needed for pain.      [name] albuterol (PROVENTIL HFA;VENTOLIN HFA) 108 (90 BASE) MCG/ACT inhaler Inhale 2 puffs into the lungs every 6 (six) hours as needed for wheezing or shortness of breath.      [name] albuterol (PROVENTIL) (2.5 MG/3ML) 0.083% nebulizer solution Take 2.5 mg by nebulization every 6 (six) hours as needed for wheezing or shortness of breath.       [name] HYDROcodone-acetaminophen (NORCO/VICODIN) 5-325 MG per tablet Take 1 tablet by mouth every 6 (six) hours as needed for pain.      [name] ibuprofen (ADVIL,MOTRIN) 200 MG tablet Take 600 mg  by mouth every 6 (six) hours as needed for pain.      . Prenatal Vit-Fe Fumarate-FA (PRENATAL MULTIVITAMIN) TABS Take 1 tablet by mouth daily at 12 noon.      . promethazine (PHENERGAN) 25 MG tablet Take 1 tablet (25 mg total) by mouth every 6 (six) hours as needed for nausea.  20 tablet  1    Review of Systems  Gastrointestinal: Positive for vomiting and abdominal pain.  Neurological: Positive for headaches.   Physical Exam   Blood pressure 121/74, pulse 92, height 5\' 4"  (1.626 m), weight 192 lb (87.091 kg), last menstrual period 01/13/2013.  Physical Exam  Nursing note and vitals reviewed. Constitutional: She is oriented to person, place, and time. She appears well-developed and well-nourished. No distress.  HENT:  Head: Normocephalic.  Eyes: Pupils are equal, round, and  reactive to light.  Neck: Normal range of motion.  Cardiovascular: Normal rate, regular rhythm and normal heart sounds.   Respiratory: Effort normal and breath sounds normal.  GI: Soft. Bowel sounds are normal. She exhibits no distension. There is no tenderness. There is no rebound and no guarding.  Genitourinary: Vagina normal.  Vag exam FT/TH/high   Musculoskeletal: Normal range of motion.  Neurological: She is alert and oriented to person, place, and time. She has normal reflexes.  Skin: Skin is warm and dry.  Psychiatric: She has a normal mood and affect. Her behavior is normal.   FHR cat 1 toco - none   CBC    Component Value Date/Time   WBC 9.1 07/27/2013 2050   RBC 3.84* 07/27/2013 2050   HGB 10.8* 07/27/2013 2050   HCT 32.5* 07/27/2013 2050   PLT 148* 07/27/2013 2050   MCV 84.6 07/27/2013 2050   MCH 28.1 07/27/2013 2050   MCHC 33.2 07/27/2013 2050   RDW 14.9 07/27/2013 2050   LYMPHSABS 4.1* 07/08/2010 2245   MONOABS 0.6 07/08/2010 2245   EOSABS 0.3 07/08/2010 2245   BASOSABS 0.1 07/08/2010 2245    CMP     Component Value Date/Time   NA 137 07/27/2013 2050   K 3.4* 07/27/2013 2050   CL 106 07/27/2013 2050   CO2 22 07/27/2013 2050   GLUCOSE 86 07/27/2013 2050   BUN 4* 07/27/2013 2050   CREATININE 0.58 07/27/2013 2050   CALCIUM 9.3 07/27/2013 2050   PROT 7.2 07/27/2013 2050   ALBUMIN 3.0* 07/27/2013 2050   AST 20 07/27/2013 2050   ALT 11 07/27/2013 2050   ALKPHOS 93 07/27/2013 2050   BILITOT 0.5 07/27/2013 2050   GFRNONAA >90 07/27/2013 2050   GFRAA >90 07/27/2013 2050      MAU Course  Procedures Vag exam D5LR IVF bolus zofran IV Phenergan IV fiorcet 2 tabs PO UA x2    Assessment and Plan  IUP at [redacted]w[redacted]d Ketonuria - resolved Mild dehydration - resolved Mild hypokalemia - recommend potassium rich foods Mild anemia - recommend FE rich foods Nausea improved - no vomiting since admission Headache improved  Pain improved No evidence of PTL  dc'd home stable, keep scheduled  f/u at office as scheduled    Lacharles Altschuler M 07/27/2013, 8:51 PM

## 2013-07-27 NOTE — MAU Note (Signed)
Patient is in with c/o sharp abdominal pain, leg pain (difficulty walking), headache and vomiting since last night. Patient states that she felt dizzy and fell on her stair onto her abdomen last night. She denies vaginal bleeding or lof. She reports good fetal movement. Her abdomen does not have any visible bruising, non- tender to touch. fht 150-152

## 2013-07-28 LAB — URINE CULTURE: Culture: NO GROWTH

## 2013-08-12 ENCOUNTER — Inpatient Hospital Stay (HOSPITAL_COMMUNITY)
Admission: AD | Admit: 2013-08-12 | Discharge: 2013-08-13 | Disposition: A | Discharge status: Home / Self Care | Payer: 59 | Source: Ambulatory Visit | Attending: Obstetrics and Gynecology | Admitting: Obstetrics and Gynecology

## 2013-08-12 ENCOUNTER — Encounter (HOSPITAL_COMMUNITY): Payer: Self-pay | Admitting: *Deleted

## 2013-08-12 DIAGNOSIS — O4700 False labor before 37 completed weeks of gestation, unspecified trimester: Secondary | ICD-10-CM | POA: Insufficient documentation

## 2013-08-12 DIAGNOSIS — R824 Acetonuria: Secondary | ICD-10-CM | POA: Insufficient documentation

## 2013-08-12 DIAGNOSIS — O26893 Other specified pregnancy related conditions, third trimester: Secondary | ICD-10-CM

## 2013-08-12 DIAGNOSIS — O99891 Other specified diseases and conditions complicating pregnancy: Secondary | ICD-10-CM | POA: Insufficient documentation

## 2013-08-12 DIAGNOSIS — G8929 Other chronic pain: Secondary | ICD-10-CM | POA: Insufficient documentation

## 2013-08-12 LAB — URINALYSIS, ROUTINE W REFLEX MICROSCOPIC
Bilirubin Urine: NEGATIVE
Hgb urine dipstick: NEGATIVE
Protein, ur: NEGATIVE mg/dL
Urobilinogen, UA: 0.2 mg/dL (ref 0.0–1.0)

## 2013-08-12 LAB — URINE MICROSCOPIC-ADD ON

## 2013-08-12 NOTE — MAU Note (Signed)
Pt reports a lot of pressure and contractions all day , worsening. Denies bleeding.

## 2013-08-12 NOTE — MAU Note (Signed)
Pt states she has been contracting since this afternoon about 1300. Pt states contractions became worse about 2000.

## 2013-08-13 LAB — WET PREP, GENITAL: Yeast Wet Prep HPF POC: NONE SEEN

## 2013-08-13 LAB — FETAL FIBRONECTIN: Fetal Fibronectin: NEGATIVE

## 2013-08-13 LAB — OB RESULTS CONSOLE GC/CHLAMYDIA: Gonorrhea: NEGATIVE

## 2013-08-13 MED ORDER — NIFEDIPINE 10 MG PO CAPS
30.0000 mg | ORAL_CAPSULE | Freq: Once | ORAL | Status: AC
  Administered 2013-08-13: 30 mg via ORAL
  Filled 2013-08-13: qty 3

## 2013-08-13 NOTE — Progress Notes (Signed)
1cm outer os inner os FT

## 2013-08-13 NOTE — MAU Provider Note (Signed)
History   Kayton Talamantez is a 27y.o. SBF who presents for PTL eval w/ cc of contractions intermittently today, but over last hr before presenting, at least 6/hr.  Denies LOF or VB.  No abnl d/c.  Fetus very active tonight.  Urinary urgency, but no dysuria.   Reports n/v today before supper.  Ate oatmeal for breakfast, had pretzels for AM snack, no lunch, then spaghetti for supper.  Thinks GERD may be trigger for n/v today.  Normal BM today; usual occur qod.  No resp c/os, but does report temporal headache today which she took Hydrocodone for w/ some relief.  Accompanied by her brother and her friend who will be this child's godmother.  Denies any pv in last 24 hrs.  Pt works at [employer]."  Reports pelvic pain and pressure worse w/ standing. Seen at [redacted]w[redacted]d and FFN neg at that time.  Patient Active Problem List   Diagnosis Date Noted  . History of CVA (cerebrovascular accident) 04/09/2013  . PPH (postpartum hemorrhage) 02/26/2013  . Chlamydia infection, current pregnancy 02/18/2013  . Hx Endometrial polyp 02/16/2013  . Sickle cell trait 02/16/2013  . Asthma 02/16/2013  . Migraines 02/16/2013   CSN: [id number]  Arrival date and time: 08/12/13 2302   None     Chief Complaint  Patient presents with  . Contractions  . Pelvic Pain   HPI  OB History   Grav Para Term Preterm Abortions TAB SAB Ect Mult Living   2 1 1       1       Past Medical History  Diagnosis Date  . Placenta previa antepartum 2010    Placenta moved 3 days before baby was born  . Abnormal Pap smear 2010    Repeat pap done;Hysteroscopy;Last pap 2012;was normal  . Infection     BV;may get frequently  . Anemia 2010    Iron supplements PP  . Asthma     Has a nebulizer;triggered by stress  . Stroke 2013    MD thought may have had a stroke and heart attack;has neurologist @ [facility]  . Heart attack     MD thinks may have had a heart attack;Cardiologist @ [facility]  . Nerve pain     Chronic;d/t possible  stroke and MI  . Sickle cell trait   . Headache(784.0)   . History of CVA (cerebrovascular accident) 04/09/2013    Pt reports presumptive "stroke" when she was 27y.o.; followed by [facility] neurology for "chronic nerve pain" and has been Rx'd Hydrocodone and Mobic    Past Surgical History  Procedure Laterality Date  . Hysteroscopy  2010    Family History  Problem Relation Age of Onset  . Sickle cell trait Father   . Hypertension Mother   . Cervical cancer Paternal Aunt   . Cervical cancer Paternal Grandmother   . Anemia Mother   . Diabetes Paternal Grandmother   . Diabetes Paternal Grandfather   . Diabetes Paternal Aunt   . Asthma Brother   . Asthma Daughter   . Asthma Mother     History  Substance Use Topics  . Smoking status: Never Smoker   . Smokeless tobacco: Never Used  . Alcohol Use: Yes     Comment: socially;prior to pregnancy    Allergies:  Allergies  Allergen Reactions  . Ginger Swelling    Prescriptions prior to admission  Medication Sig Dispense Refill  . acetaminophen (TYLENOL) 325 MG tablet Take 650 mg by mouth every 6 (six)  hours as needed for pain.      [name] albuterol (PROVENTIL HFA;VENTOLIN HFA) 108 (90 BASE) MCG/ACT inhaler Inhale 2 puffs into the lungs every 6 (six) hours as needed for wheezing or shortness of breath.      [name] albuterol (PROVENTIL) (2.5 MG/3ML) 0.083% nebulizer solution Take 2.5 mg by nebulization every 6 (six) hours as needed for wheezing or shortness of breath.       [name] HYDROcodone-acetaminophen (NORCO/VICODIN) 5-325 MG per tablet Take 1 tablet by mouth every 6 (six) hours as needed for pain.      . Prenatal Vit-Fe Fumarate-FA (PRENATAL MULTIVITAMIN) TABS Take 1 tablet by mouth daily at 12 noon.      . promethazine (PHENERGAN) 25 MG tablet Take 1 tablet (25 mg total) by mouth every 6 (six) hours as needed for nausea.  20 tablet  1    ROS--see HPI Physical Exam   Blood pressure 112/82, pulse 99, temperature 97.9 F (36.6 C),  temperature source Oral, resp. rate 20, height 5\' 5"  (1.651 m), weight 191 lb (86.637 kg), last menstrual period 01/13/2013, SpO2 100.00%.  Physical Exam  Constitutional: She is oriented to person, place, and time. She appears well-developed and well-nourished.  Slow position changes; NAD; no labored breathing or grimace; rocks and adjusts her position frequently  HENT:  Head: Normocephalic and atraumatic.  Eyes: Pupils are equal, round, and reactive to light.  Cardiovascular: Normal rate.   Respiratory: Effort normal.  GI: Soft.  gravid  Genitourinary:  Cx: ext os 1cm/int os FT/long/-3, vtx softening SSE: nonodorous white d/c in vault; some mucous at os; some homogenously coating walls and cx  Neurological: She is alert and oriented to person, place, and time.  Skin: Skin is warm and dry.  .. Results for orders placed during the hospital encounter of 08/12/13 (from the past 24 hour(s))  URINALYSIS, ROUTINE W REFLEX MICROSCOPIC     Status: Abnormal   Collection Time    08/12/13 11:11 PM      Result Value Range   Color, Urine YELLOW  YELLOW   APPearance CLEAR  CLEAR   Specific Gravity, Urine 1.025  1.005 - 1.030   pH 6.0  5.0 - 8.0   Glucose, UA NEGATIVE  NEGATIVE mg/dL   Hgb urine dipstick NEGATIVE  NEGATIVE   Bilirubin Urine NEGATIVE  NEGATIVE   Ketones, ur 15 (*) NEGATIVE mg/dL   Protein, ur NEGATIVE  NEGATIVE mg/dL   Urobilinogen, UA 0.2  0.0 - 1.0 mg/dL   Nitrite NEGATIVE  NEGATIVE   Leukocytes, UA TRACE (*) NEGATIVE  URINE MICROSCOPIC-ADD ON     Status: Abnormal   Collection Time    08/12/13 11:11 PM      Result Value Range   Squamous Epithelial / LPF RARE  RARE   WBC, UA 0-2  <3 WBC/hpf   RBC / HPF 0-2  <3 RBC/hpf   Bacteria, UA MANY (*) RARE  FETAL FIBRONECTIN     Status: None   Collection Time    08/13/13 12:05 AM      Result Value Range   Fetal Fibronectin NEGATIVE  NEGATIVE  WET PREP, GENITAL     Status: Abnormal   Collection Time    08/13/13 12:05 AM       Result Value Range   Yeast Wet Prep HPF POC NONE SEEN  NONE SEEN   Trich, Wet Prep NONE SEEN  NONE SEEN   Clue Cells Wet Prep HPF POC FEW (*) NONE SEEN  WBC, Wet Prep HPF POC MANY (*) NONE SEEN    MAU Course  Procedures 1. FFN  2. Wet prep 3. U/a and c&S 4. Po water 5. Procardia 30mg  po x1 6. NST: 145, reactive, no decels, mod variability; TOCO: occ'l ctxs some UI  Assessment and Plan  1. [redacted]w[redacted]d 2. occ'l contractions w/ stable cx and 2nd neg FFN in pregnancy 3. Poor water intake 4. Chronic pain r/e h/o CVA 5. Few clue on wet prep 6. ketonuria  1. D/c'd home w/ PTL precautions; pt felt about the same after procardia, so didn't give Rx 2. Instructed pt to have office recheck for BV at her appt this Thurs  3. rec'd pool and maternity band for comfort; rev'd adequate daily po water intake 4. F/u prn; will watch for c&s result  Sai Moura H 08/13/2013, 12:49 AM

## 2013-08-14 LAB — URINE CULTURE: Colony Count: 9000

## 2013-08-21 NOTE — MAU Provider Note (Signed)
History   CSN: [id number]  Arrival date and time: 06/10/13 1251   Chief Complaint  Patient presents with  . Contractions   HPI Pt presents unannouced to MAU with complaints of frequent uterine contractions the entire morning with increased intensity.  Denies ROM or bldg.  Reports active fetus.  No hx of PTL or PTD with previous pregnancy. Denies UTI s/s.  Denies increased vag discharge, itch, burn or odor.    OB History   Grav Para Term Preterm Abortions TAB SAB Ect Mult Living   2 1 1       1       Past Medical History  Diagnosis Date  . Placenta previa antepartum 2010    Placenta moved 3 days before baby was born  . Abnormal Pap smear 2010    Repeat pap done;Hysteroscopy;Last pap 2012;was normal  . Infection     BV;may get frequently  . Anemia 2010    Iron supplements PP  . Asthma     Has a nebulizer;triggered by stress  . Stroke 2013    MD thought may have had a stroke and heart attack;has neurologist @ [facility]  . Heart attack     MD thinks may have had a heart attack;Cardiologist @ [facility]  . Nerve pain     Chronic;d/t possible stroke and MI  . Sickle cell trait   . Headache(784.0)   . History of CVA (cerebrovascular accident) 04/09/2013    Pt reports presumptive "stroke" when she was 27y.o.; followed by [facility] neurology for "chronic nerve pain" and has been Rx'd Hydrocodone and Mobic    Past Surgical History  Procedure Laterality Date  . Hysteroscopy  2010    Family History  Problem Relation Age of Onset  . Sickle cell trait Father   . Hypertension Mother   . Cervical cancer Paternal Aunt   . Cervical cancer Paternal Grandmother   . Anemia Mother   . Diabetes Paternal Grandmother   . Diabetes Paternal Grandfather   . Diabetes Paternal Aunt   . Asthma Brother   . Asthma Daughter   . Asthma Mother     History  Substance Use Topics  . Smoking status: Never Smoker   . Smokeless tobacco: Never Used  . Alcohol Use: Yes     Comment:  socially;prior to pregnancy    Allergies:  Allergies  Allergen Reactions  . Ginger Swelling    No prescriptions prior to admission    Review of Systems  Constitutional: Negative.   HENT: Negative.   Eyes: Negative.   Respiratory: Negative.   Cardiovascular: Negative.   Gastrointestinal: Negative.   Genitourinary: Negative.   Musculoskeletal: Negative.   Skin: Negative.   Neurological: Negative.   Endo/Heme/Allergies: Negative.   Psychiatric/Behavioral: Negative.    Physical Exam   Blood pressure 109/57, pulse 97, temperature 97.2 F (36.2 C), temperature source Oral, resp. rate 16, last menstrual period 01/13/2013.  Physical Exam  Constitutional: She is oriented to person, place, and time. She appears well-developed and well-nourished.  HENT:  Head: Normocephalic and atraumatic.  Left Ear: External ear normal.  Nose: Nose normal.  Eyes: Conjunctivae are normal. Pupils are equal, round, and reactive to light.  Neck: Normal range of motion. Neck supple.  Cardiovascular: Normal rate, regular rhythm and intact distal pulses.   Respiratory: Effort normal and breath sounds normal.  GI: Soft. Bowel sounds are normal. She exhibits no distension. There is no tenderness. There is no rebound and no guarding.  Gravid, c/w  22wks. FHTs present with doppler.  Genitourinary: Vagina normal and uterus normal.  Wet prep and culture deferred at present due to recent performance at [facility] office.  Ext gentalia WNL.  BUS neg.  Neg CMT.  Cx without lesions or discharge/visually closed.  SVE: Ext os 1cm/Int os closed/thick/firm/posterior.  Toco with uterine irritability and occas contraction noted.   Palpable mild to moderate contractions noted every 3-10 minutes upon presentation.  Contractions and cramping resolved after IVF and procardia given and prior to discharge.  Musculoskeletal: Normal range of motion.  Neurological: She is alert and oriented to person, place, and time. She has normal  reflexes.  Skin: Skin is warm and dry.  Psychiatric: She has a normal mood and affect. Her behavior is normal.   UA-Mod leuks trace Hgb, SG 1.005, otherwise neg.    MAU Course  Procedures IVF Procardia  Assessment and Plan  IUP at 22w  Preterm contractions  Discharged to home. Rev s/s preterm labor. F/U as sched at [facility] and enc to call to sched appt to be seen within the next 2 weeks.    Tiffeny Minchew O. 08/21/2013, 12:51 PM

## 2013-09-06 ENCOUNTER — Inpatient Hospital Stay (HOSPITAL_COMMUNITY)
Admission: AD | Admit: 2013-09-06 | Discharge: 2013-09-06 | Disposition: A | Discharge status: Home / Self Care | Payer: 59 | Source: Ambulatory Visit | Attending: Obstetrics and Gynecology | Admitting: Obstetrics and Gynecology

## 2013-09-06 ENCOUNTER — Encounter (HOSPITAL_COMMUNITY): Payer: Self-pay | Admitting: *Deleted

## 2013-09-06 DIAGNOSIS — O4700 False labor before 37 completed weeks of gestation, unspecified trimester: Secondary | ICD-10-CM | POA: Insufficient documentation

## 2013-09-06 LAB — URINALYSIS, ROUTINE W REFLEX MICROSCOPIC
Bilirubin Urine: NEGATIVE
Nitrite: NEGATIVE
Protein, ur: NEGATIVE mg/dL
Specific Gravity, Urine: >1.03 — ABNORMAL HIGH (ref 1.005–1.030)
Urobilinogen, UA: 0.2 mg/dL (ref 0.0–1.0)

## 2013-09-06 LAB — URINE MICROSCOPIC-ADD ON

## 2013-09-06 NOTE — MAU Provider Note (Signed)
History     CSN: [id number]  Arrival date and time: 09/06/13 1857   First Provider Initiated Contact with Patient 09/06/13 2010      Chief Complaint  Patient presents with  . Contractions   HPI Comments: PT here w vague complaints of pelvic pain and generalized abdominal pain, hasn't taken any meds, requesting to eat. Denies ctx, no VB or LOF.       Past Medical History  Diagnosis Date  . Placenta previa antepartum 2010    Placenta moved 3 days before baby was born  . Abnormal Pap smear 2010    Repeat pap done;Hysteroscopy;Last pap 2012;was normal  . Infection     BV;may get frequently  . Anemia 2010    Iron supplements PP  . Asthma     Has a nebulizer;triggered by stress  . Stroke 2013    MD thought may have had a stroke and heart attack;has neurologist @ [facility]  . Heart attack     MD thinks may have had a heart attack;Cardiologist @ [facility]  . Nerve pain     Chronic;d/t possible stroke and MI  . Sickle cell trait   . Headache(784.0)   . History of CVA (cerebrovascular accident) 04/09/2013    Pt reports presumptive "stroke" when she was 27y.o.; followed by [facility] neurology for "chronic nerve pain" and has been Rx'd Hydrocodone and Mobic    Past Surgical History  Procedure Laterality Date  . Hysteroscopy  2010    Family History  Problem Relation Age of Onset  . Sickle cell trait Father   . Hypertension Mother   . Cervical cancer Paternal Aunt   . Cervical cancer Paternal Grandmother   . Anemia Mother   . Diabetes Paternal Grandmother   . Diabetes Paternal Grandfather   . Diabetes Paternal Aunt   . Asthma Brother   . Asthma Daughter   . Asthma Mother     History  Substance Use Topics  . Smoking status: Never Smoker   . Smokeless tobacco: Never Used  . Alcohol Use: Yes     Comment: socially;prior to pregnancy    Allergies:  Allergies  Allergen Reactions  . Ginger Swelling    Prescriptions prior to admission  Medication Sig  Dispense Refill  . acetaminophen (TYLENOL) 325 MG tablet Take 650 mg by mouth every 6 (six) hours as needed for pain.      [name] HYDROcodone-acetaminophen (NORCO/VICODIN) 5-325 MG per tablet Take 1 tablet by mouth every 6 (six) hours as needed for pain.      . Prenatal Vit-Fe Fumarate-FA (PRENATAL MULTIVITAMIN) TABS Take 1 tablet by mouth daily at 12 noon.      . promethazine (PHENERGAN) 25 MG tablet Take 1 tablet (25 mg total) by mouth every 6 (six) hours as needed for nausea.  20 tablet  1  . albuterol (PROVENTIL HFA;VENTOLIN HFA) 108 (90 BASE) MCG/ACT inhaler Inhale 2 puffs into the lungs every 6 (six) hours as needed for wheezing or shortness of breath.      [name] albuterol (PROVENTIL) (2.5 MG/3ML) 0.083% nebulizer solution Take 2.5 mg by nebulization every 6 (six) hours as needed for wheezing or shortness of breath.         Review of Systems  Gastrointestinal: Positive for abdominal pain.  All other systems reviewed and are negative.   Physical Exam   Blood pressure 114/67, pulse 77, temperature 97.8 F (36.6 C), temperature source Oral, resp. rate 18, height 5\' 4"  (1.626 m), weight 196  lb 3.2 oz (88.996 kg), last menstrual period 01/13/2013.  Physical Exam  Nursing note and vitals reviewed. Constitutional: She is oriented to person, place, and time. She appears well-developed and well-nourished. No distress.  Cardiovascular: Normal rate.   Respiratory: Effort normal.  GI: Soft.  Genitourinary: Vagina normal.  Cervix th/long/high   Musculoskeletal: Normal range of motion.  Neurological: She is alert and oriented to person, place, and time. She has normal reflexes.  Skin: Skin is warm and dry.  Psychiatric: She has a normal mood and affect. Her behavior is normal.    MAU Course  Procedures   Assessment and Plan  IUP at 35w4 Hx chronic pelvic pain FHR cat 1 toco quiet  Not in labor Declines meds here, says she will take her own at home Ready for discharge dc'd home stable  condition, rv'd FCK and labor sx's and keep scheduled f/u appt   Ollen Rao M 09/06/2013, 8:19 PM

## 2013-09-06 NOTE — MAU Note (Signed)
Pt G2 P1 at 34.4wks, having contractions every .  Denies bleeding or leaking.

## 2013-09-08 LAB — URINE CULTURE: Colony Count: 3000

## 2013-09-16 LAB — OB RESULTS CONSOLE GBS: GBS: NEGATIVE

## 2013-10-02 ENCOUNTER — Encounter (HOSPITAL_COMMUNITY): Payer: Self-pay | Admitting: *Deleted

## 2013-10-02 ENCOUNTER — Inpatient Hospital Stay (HOSPITAL_COMMUNITY)
Admission: AD | Admit: 2013-10-02 | Discharge: 2013-10-02 | Disposition: A | Discharge status: Home / Self Care | Payer: 59 | Source: Ambulatory Visit | Attending: Obstetrics and Gynecology | Admitting: Obstetrics and Gynecology

## 2013-10-02 DIAGNOSIS — O479 False labor, unspecified: Secondary | ICD-10-CM | POA: Insufficient documentation

## 2013-10-02 NOTE — MAU Note (Signed)
Pt has been having contractions since yesterday afternoon. No ROM.

## 2013-10-05 ENCOUNTER — Inpatient Hospital Stay (HOSPITAL_COMMUNITY)
Admission: AD | Admit: 2013-10-05 | Discharge: 2013-10-05 | Disposition: A | Discharge status: Home / Self Care | Payer: 59 | Source: Ambulatory Visit | Attending: Obstetrics and Gynecology | Admitting: Obstetrics and Gynecology

## 2013-10-05 ENCOUNTER — Encounter (HOSPITAL_COMMUNITY): Payer: Self-pay

## 2013-10-05 DIAGNOSIS — O479 False labor, unspecified: Secondary | ICD-10-CM | POA: Insufficient documentation

## 2013-10-05 NOTE — MAU Note (Signed)
Patient is in for labor eval. She c/o ctx 5-42mins since early morning. Denies vaginal bleeding or lof. She reports good fetal movement (less than usual). Last week was 3-4cm ).

## 2013-10-05 NOTE — MAU Note (Signed)
Patient states she is having contractions every 5-10 minutes. Denies bleeding or leaking and reports some fetal movement. Less than usual.

## 2013-10-06 NOTE — MAU Provider Note (Addendum)
  History     CSN: [id number]  Arrival date and time: 10/05/13 0901   First Provider Initiated Contact with Patient 10/05/13 1210      Chief Complaint  Patient presents with  . Labor Eval   HPI  Pt presents c/o ctxs.  Labor check was performed by the RN Peace in the MAU.  Pt was checked twice by her and frustrated that she was not in labor.  Her mother requested to be seen by MD.  I went to see pt and she is really hoping to be in labor and admitted.  After checking her and determining she is not currently in labor the patient was ok with going home.  She takes pain med prn for chronic pain she has managed by pain clinic and apparently her mother is stressed as well.  Pt denies LOF or VB and reports good FM.  OB History   Grav Para Term Preterm Abortions TAB SAB Ect Mult Living   2 1 1       1       Past Medical History  Diagnosis Date  . Placenta previa antepartum 2010    Placenta moved 3 days before baby was born  . Abnormal Pap smear 2010    Repeat pap done;Hysteroscopy;Last pap 2012;was normal  . Infection     BV;may get frequently  . Anemia 2010    Iron supplements PP  . Asthma     Has a nebulizer;triggered by stress  . Stroke 2013    MD thought may have had a stroke and heart attack;has neurologist @ [facility]  . Heart attack     MD thinks may have had a heart attack;Cardiologist @ [facility]  . Nerve pain     Chronic;d/t possible stroke and MI  . Sickle cell trait   . Headache(784.0)   . History of CVA (cerebrovascular accident) 04/09/2013    Pt reports presumptive "stroke" when she was 27y.o.; followed by [facility] neurology for "chronic nerve pain" and has been Rx'd Hydrocodone and Mobic    Past Surgical History  Procedure Laterality Date  . Hysteroscopy  2010    Family History  Problem Relation Age of Onset  . Sickle cell trait Father   . Hypertension Mother   . Cervical cancer Paternal Aunt   . Cervical cancer Paternal Grandmother   . Anemia  Mother   . Diabetes Paternal Grandmother   . Diabetes Paternal Grandfather   . Diabetes Paternal Aunt   . Asthma Brother   . Asthma Daughter   . Asthma Mother     History  Substance Use Topics  . Smoking status: Never Smoker   . Smokeless tobacco: Never Used  . Alcohol Use: Yes     Comment: socially;prior to pregnancy    Allergies:  Allergies  Allergen Reactions  . Ginger Swelling    No prescriptions prior to admission    ROS Denies fever, chills, n/v, abdominal pain, chest pain or SOB  Physical Exam   Blood pressure 129/90, pulse 89, temperature 97.6 F (36.4 C), temperature source Oral, resp. rate 18, last menstrual period 01/13/2013, SpO2 100.00%.  Physical Exam  VE 2-3/50%/-2 FHT Cat 1 Toco occasional  MAU Course  Procedures  Monitoring  Assessment and Plan  P1 at 38 5/7wks presenting for labor check not in labor.  Fetal status is overall reassuring.  Pt instructed to keep rob appt in the office as already scheduled.  Purcell Nails 10/06/2013, 7:32 PM

## 2013-10-18 ENCOUNTER — Inpatient Hospital Stay (HOSPITAL_COMMUNITY)
Admission: AD | Admit: 2013-10-18 | Discharge: 2013-10-20 | DRG: 775 | Disposition: A | Discharge status: Home / Self Care | Payer: 59 | Source: Ambulatory Visit | Attending: Obstetrics and Gynecology | Admitting: Obstetrics and Gynecology

## 2013-10-18 ENCOUNTER — Encounter (HOSPITAL_COMMUNITY): Payer: Self-pay

## 2013-10-18 DIAGNOSIS — O9902 Anemia complicating childbirth: Secondary | ICD-10-CM | POA: Diagnosis present

## 2013-10-18 DIAGNOSIS — D573 Sickle-cell trait: Secondary | ICD-10-CM | POA: Diagnosis present

## 2013-10-18 NOTE — MAU Note (Signed)
Contractions tonight. Denies LOF or VB. Positive fetal movement.

## 2013-10-19 ENCOUNTER — Encounter (HOSPITAL_COMMUNITY): Payer: 59 | Admitting: Anesthesiology

## 2013-10-19 ENCOUNTER — Inpatient Hospital Stay (HOSPITAL_COMMUNITY): Payer: 59 | Admitting: Anesthesiology

## 2013-10-19 ENCOUNTER — Encounter (HOSPITAL_COMMUNITY): Payer: Self-pay | Admitting: *Deleted

## 2013-10-19 DIAGNOSIS — I10 Essential (primary) hypertension: Secondary | ICD-10-CM | POA: Insufficient documentation

## 2013-10-19 LAB — HEPATITIS B SURFACE ANTIGEN: Hepatitis B Surface Ag: NEGATIVE

## 2013-10-19 LAB — RPR: RPR Ser Ql: NONREACTIVE

## 2013-10-19 LAB — CBC
Hemoglobin: 12 g/dL (ref 12.0–15.0)
MCH: 27.5 pg (ref 26.0–34.0)
MCV: 81 fL (ref 78.0–100.0)
Platelets: 125 10*3/uL — ABNORMAL LOW (ref 150–400)
RBC: 4.37 MIL/uL (ref 3.87–5.11)
RDW: 15.5 % (ref 11.5–15.5)

## 2013-10-19 LAB — ABO/RH: ABO/RH(D): O POS

## 2013-10-19 MED ORDER — IBUPROFEN 600 MG PO TABS
600.0000 mg | ORAL_TABLET | Freq: Four times a day (QID) | ORAL | Status: DC
  Administered 2013-10-19 – 2013-10-20 (×5): 600 mg via ORAL
  Filled 2013-10-19 (×5): qty 1

## 2013-10-19 MED ORDER — FENTANYL 2.5 MCG/ML BUPIVACAINE 1/10 % EPIDURAL INFUSION (WH - ANES)
14.0000 mL/h | INTRAMUSCULAR | Status: DC | PRN
  Administered 2013-10-19: 14 mL/h via EPIDURAL
  Filled 2013-10-19: qty 125

## 2013-10-19 MED ORDER — DIBUCAINE 1 % RE OINT: 1.0000 "application " | TOPICAL_OINTMENT | RECTAL | Status: DC | PRN

## 2013-10-19 MED ORDER — LACTATED RINGERS IV SOLN: 500.0000 mL | INTRAVENOUS | Status: DC | PRN

## 2013-10-19 MED ORDER — ONDANSETRON HCL 4 MG/2ML IJ SOLN: 4.0000 mg | Freq: Four times a day (QID) | INTRAMUSCULAR | Status: DC | PRN

## 2013-10-19 MED ORDER — DIPHENHYDRAMINE HCL 50 MG/ML IJ SOLN: 12.5000 mg | INTRAMUSCULAR | Status: DC | PRN

## 2013-10-19 MED ORDER — PHENYLEPHRINE 40 MCG/ML (10ML) SYRINGE FOR IV PUSH (FOR BLOOD PRESSURE SUPPORT)
80.0000 ug | PREFILLED_SYRINGE | INTRAVENOUS | Status: DC | PRN
  Filled 2013-10-19: qty 2

## 2013-10-19 MED ORDER — EPHEDRINE 5 MG/ML INJ
10.0000 mg | INTRAVENOUS | Status: DC | PRN
  Filled 2013-10-19: qty 2

## 2013-10-19 MED ORDER — FENTANYL CITRATE 0.05 MG/ML IJ SOLN
100.0000 ug | INTRAMUSCULAR | Status: DC | PRN
  Administered 2013-10-19 (×4): 100 ug via INTRAVENOUS
  Filled 2013-10-19 (×4): qty 2

## 2013-10-19 MED ORDER — INFLUENZA VAC SPLIT QUAD 0.5 ML IM SUSP
0.5000 mL | INTRAMUSCULAR | Status: AC
  Administered 2013-10-20: 0.5 mL via INTRAMUSCULAR
  Filled 2013-10-19: qty 0.5

## 2013-10-19 MED ORDER — ONDANSETRON HCL 4 MG PO TABS: 4.0000 mg | ORAL_TABLET | ORAL | Status: DC | PRN

## 2013-10-19 MED ORDER — SIMETHICONE 80 MG PO CHEW: 80.0000 mg | CHEWABLE_TABLET | ORAL | Status: DC | PRN

## 2013-10-19 MED ORDER — SENNOSIDES-DOCUSATE SODIUM 8.6-50 MG PO TABS
2.0000 | ORAL_TABLET | ORAL | Status: DC
  Administered 2013-10-19: 2 via ORAL
  Filled 2013-10-19: qty 2

## 2013-10-19 MED ORDER — BENZOCAINE-MENTHOL 20-0.5 % EX AERO
1.0000 "application " | INHALATION_SPRAY | CUTANEOUS | Status: DC | PRN
  Administered 2013-10-19: 1 via TOPICAL
  Filled 2013-10-19: qty 56

## 2013-10-19 MED ORDER — EPHEDRINE 5 MG/ML INJ
10.0000 mg | INTRAVENOUS | Status: DC | PRN
  Filled 2013-10-19: qty 2
  Filled 2013-10-19: qty 4

## 2013-10-19 MED ORDER — LACTATED RINGERS IV SOLN
500.0000 mL | Freq: Once | INTRAVENOUS | Status: DC
Start: 2013-10-19 — End: 2013-10-19

## 2013-10-19 MED ORDER — LACTATED RINGERS IV SOLN
INTRAVENOUS | Status: DC
  Administered 2013-10-19: 08:00:00 via INTRAVENOUS
  Administered 2013-10-19: 125 mL/h via INTRAVENOUS

## 2013-10-19 MED ORDER — LANOLIN HYDROUS EX OINT: TOPICAL_OINTMENT | CUTANEOUS | Status: DC | PRN

## 2013-10-19 MED ORDER — PRENATAL MULTIVITAMIN CH
1.0000 | ORAL_TABLET | Freq: Every day | ORAL | Status: DC
  Administered 2013-10-20: 1 via ORAL
  Filled 2013-10-19: qty 1

## 2013-10-19 MED ORDER — OXYCODONE-ACETAMINOPHEN 5-325 MG PO TABS: 1.0000 | ORAL_TABLET | ORAL | Status: DC | PRN

## 2013-10-19 MED ORDER — CITRIC ACID-SODIUM CITRATE 334-500 MG/5ML PO SOLN: 30.0000 mL | ORAL | Status: DC | PRN

## 2013-10-19 MED ORDER — OXYTOCIN BOLUS FROM INFUSION: 500.0000 mL | INTRAVENOUS | Status: DC

## 2013-10-19 MED ORDER — WITCH HAZEL-GLYCERIN EX PADS: 1.0000 "application " | MEDICATED_PAD | CUTANEOUS | Status: DC | PRN

## 2013-10-19 MED ORDER — LIDOCAINE HCL (PF) 1 % IJ SOLN
30.0000 mL | INTRAMUSCULAR | Status: DC | PRN
  Filled 2013-10-19 (×2): qty 30

## 2013-10-19 MED ORDER — OXYTOCIN 40 UNITS IN LACTATED RINGERS INFUSION - SIMPLE MED
62.5000 mL/h | INTRAVENOUS | Status: DC
  Filled 2013-10-19: qty 1000

## 2013-10-19 MED ORDER — ACETAMINOPHEN 325 MG PO TABS: 650.0000 mg | ORAL_TABLET | ORAL | Status: DC | PRN

## 2013-10-19 MED ORDER — LIDOCAINE HCL (PF) 1 % IJ SOLN
INTRAMUSCULAR | Status: DC | PRN
  Administered 2013-10-19 (×2): 5 mL

## 2013-10-19 MED ORDER — ZOLPIDEM TARTRATE 5 MG PO TABS: 5.0000 mg | ORAL_TABLET | Freq: Every evening | ORAL | Status: DC | PRN

## 2013-10-19 MED ORDER — ONDANSETRON HCL 4 MG/2ML IJ SOLN: 4.0000 mg | INTRAMUSCULAR | Status: DC | PRN

## 2013-10-19 MED ORDER — OXYCODONE-ACETAMINOPHEN 5-325 MG PO TABS
1.0000 | ORAL_TABLET | ORAL | Status: DC | PRN
Start: 2013-10-19 — End: 2013-10-20
  Administered 2013-10-20: 1 via ORAL
  Filled 2013-10-19: qty 1

## 2013-10-19 MED ORDER — TETANUS-DIPHTH-ACELL PERTUSSIS 5-2.5-18.5 LF-MCG/0.5 IM SUSP: 0.5000 mL | Freq: Once | INTRAMUSCULAR | Status: DC

## 2013-10-19 MED ORDER — PHENYLEPHRINE 40 MCG/ML (10ML) SYRINGE FOR IV PUSH (FOR BLOOD PRESSURE SUPPORT)
80.0000 ug | PREFILLED_SYRINGE | INTRAVENOUS | Status: DC | PRN
  Filled 2013-10-19: qty 5
  Filled 2013-10-19: qty 2

## 2013-10-19 MED ORDER — DIPHENHYDRAMINE HCL 25 MG PO CAPS: 25.0000 mg | ORAL_CAPSULE | Freq: Four times a day (QID) | ORAL | Status: DC | PRN

## 2013-10-19 MED ORDER — IBUPROFEN 600 MG PO TABS: 600.0000 mg | ORAL_TABLET | Freq: Four times a day (QID) | ORAL | Status: DC | PRN

## 2013-10-19 NOTE — H&P (Signed)
Melissa [name] is a 27 y.o. female presenting for labor eval at [redacted]w[redacted]d. Ctx since sun afternoon, now getting stronger. Denies any VB or LOF, GFM. Cervix initially 3cm, changed to 4cm after 2hrs and pt requesting pain medicine.   Pregnancy hx:  Pt began PNC at [facility] at 7wks, viability [place] due to vag bleeding,  Anatomy US at 18wks WNL Quad screen WNL Pt seen in MAU frequently for pt ctx, without cervical change,    Maternal Medical History:  Reason for admission: Contractions.   Contractions: Onset was 13-24 hours ago.   Frequency: regular.   Duration is approximately 60 seconds.   Perceived severity is moderate.    Fetal activity: Perceived fetal activity is normal.   Last perceived fetal movement was within the past hour.    Prenatal complications: no prenatal complications Prenatal Complications - Diabetes: none.    OB History   Grav Para Term Preterm Abortions TAB SAB Ect Mult Living   2 1 1       1      Past Medical History  Diagnosis Date  . Placenta previa antepartum 2010    Placenta moved 3 days before baby was born  . Abnormal Pap smear 2010    Repeat pap done;Hysteroscopy;Last pap 2012;was normal  . Infection     BV;may get frequently  . Anemia 2010    Iron supplements PP  . Asthma     Has a nebulizer;triggered by stress  . Stroke 2013    MD thought may have had a stroke and heart attack;has neurologist @ [facility]  . Heart attack     MD thinks may have had a heart attack;Cardiologist @ [facility]  . Nerve pain     Chronic;d/t possible stroke and MI  . Sickle cell trait   . Headache(784.0)   . History of CVA (cerebrovascular accident) 04/09/2013    Pt reports presumptive "stroke" when she was 27y.o.; followed by [facility] neurology for "chronic nerve pain" and has been Rx'd Hydrocodone and Mobic   Past Surgical History  Procedure Laterality Date  . Hysteroscopy  2010   Family History: family history includes Anemia in her mother; Asthma in her brother,  daughter, and mother; Cervical cancer in her paternal aunt and paternal grandmother; Diabetes in her paternal aunt, paternal grandfather, and paternal grandmother; Hypertension in her mother; Sickle cell trait in her father. Social History:  reports that she has never smoked. She has never used smokeless tobacco. She reports that she drinks alcohol. She reports that she does not use illicit drugs.   Prenatal Transfer Tool  Maternal Diabetes: No Genetic Screening: Normal Maternal Ultrasounds/Referrals: Normal Fetal Ultrasounds or other Referrals:  None Maternal Substance Abuse:  Yes:  Type: Prescription drugs vicodin intermittently for chronic pain Significant Maternal Medications:  Meds include: Other:  Significant Maternal Lab Results:  Lab values include: Group B Strep negative Other Comments:  None  Review of Systems  Cardiovascular: Positive for leg swelling.  Musculoskeletal: Positive for back pain.    Dilation: 5 Effacement (%): 100 Station: -1 Exam by:: shelly Blood pressure 117/64, pulse 88, temperature 98.2 F (36.8 C), temperature source Oral, resp. rate 20, height 5\' 5"  (1.651 m), weight 197 lb 9.6 oz (89.631 kg), last menstrual period 01/13/2013, SpO2 99.00%. Maternal Exam:  Uterine Assessment: Contraction strength is moderate.  Contraction duration is 60 seconds. Contraction frequency is regular.   Abdomen: Patient reports no abdominal tenderness. Fundal height is aga.   Estimated fetal weight is 8#.  Fetal presentation: vertex  Introitus: Normal vulva. Normal vagina.  Pelvis: adequate for delivery.   Cervix: Cervix evaluated by digital exam.     Fetal Exam Fetal Monitor Review: Mode: ultrasound.   Baseline rate: 140.  Variability: moderate (6-25 bpm).   Pattern: accelerations present and no decelerations.    Fetal State Assessment: Category I - tracings are normal.     Physical Exam  Nursing note and vitals reviewed. Constitutional: She is oriented to  person, place, and time. She appears well-developed and well-nourished.  HENT:  Head: Normocephalic.  Eyes: Pupils are equal, round, and reactive to light.  Neck: Normal range of motion.  Cardiovascular: Normal rate, regular rhythm and normal heart sounds.   Respiratory: Effort normal and breath sounds normal.  GI: Soft. Bowel sounds are normal.  Genitourinary: Vagina normal.  Musculoskeletal: Normal range of motion. She exhibits edema.  Neurological: She is alert and oriented to person, place, and time. She has normal reflexes.  Skin: Skin is warm and dry.  Psychiatric: She has a normal mood and affect. Her behavior is normal.    Prenatal labs: ABO, Rh: --/--/O POS, O POS (10/20 0150) Antibody: NEG (10/20 0150) Rubella:   unk (will draw) RPR: NON REACTIVE (10/20 0150)  HBsAg:   unk (will draw) HIV:   unk (will draw) GBS: Negative (09/17 0000)  1hr gtt normal  Quad screen normal  Pap smear 3/14 WNL   Assessment/Plan: IUP at [redacted]w[redacted]d GBS neg FHR reassuring Early labor  Admit to b.s. Per c/w Dr Normand Sloop Routine L&D orders Fentanyl q1h prn Expectant mgmnt at present   Draw Rubella, HepB, HIV  Teoman Giraud M 10/19/2013, 8:07 AM

## 2013-10-19 NOTE — Progress Notes (Signed)
Patient ID: Melissa [name], female   DOB: 19-Nov-1986, 27 y.o.   MRN: [id number] Melissa [name] is a 27 y.o. G2P1001 at [redacted]w[redacted]d admitted for labor  Subjective: Breathing w ctx, requests VE  Objective: BP 111/73  Pulse 91  Temp(Src) 98 F (36.7 C) (Oral)  Resp 20  Ht 5\' 5"  (1.651 m)  Wt 197 lb 9.6 oz (89.631 kg)  BMI 32.88 kg/m2  SpO2 99%  LMP 01/13/2013     FHT:  FHR: 140 bpm, variability: moderate,  accelerations:  Present,  decelerations:  Absent UC:   irregular, every 2 minutes SVE:   Dilation: 5 Effacement (%): 100 Station: -1 Exam by:: shelly  AROM, lg amt clear fluid  Assessment / Plan: Protracted active phase  Labor: AROM Preeclampsia:  no s/s Fetal Wellbeing:  Category I Pain Control:  Epidural Anticipated MOD:  NSVD  Recheck 2-3hr, consider pitocin augmentation   Update physician PRN   Malissa Hippo 10/19/2013, 6:12 AM

## 2013-10-19 NOTE — Progress Notes (Signed)
Melissa [name] is a 27 y.o. G2P1001 at [redacted]w[redacted]d admitted for active labor  Subjective: RN report pt is AL  Objective: BP 126/70  Pulse 70  Temp(Src) 98.2 F (36.8 C) (Oral)  Resp 20  Ht 5\' 5"  (1.651 m)  Wt 89.631 kg (197 lb 9.6 oz)  BMI 32.88 kg/m2  SpO2 99%  LMP 01/13/2013   Total I/O In: -  Out: 150 [Urine:150]  FHT:  FHR: 130s bpm, variability: moderate,  small accelerations:  Present,  decelerations:  Present occas variables UC:   regular, every 2-3 minutes SVE:   Dilation: Lip/rim Effacement (%): 100 Station: +1 Exam by:: c soliz rn  Labs: Lab Results  Component Value Date   WBC 7.6 10/19/2013   HGB 12.0 10/19/2013   HCT 35.4* 10/19/2013   MCV 81.0 10/19/2013   PLT 125* 10/19/2013    Assessment / Plan: Spontaneous labor, progressing normally  Labor: Progressing normally Preeclampsia:  no signs or symptoms of toxicity Fetal Wellbeing:  Category I Pain Control:  Epidural I/D:  GBS neg Anticipated MOD:  NSVD  Melissa [name] Y 10/19/2013, 12:52 PM

## 2013-10-19 NOTE — Anesthesia Preprocedure Evaluation (Addendum)
Anesthesia Evaluation  Patient identified by MRN, date of birth, ID band Patient awake    Reviewed: Allergy & Precautions, H&P , Patient's Chart, lab work & pertinent test results  Airway Mallampati: III TM Distance: >3 FB Neck ROM: full    Dental no notable dental hx.    Pulmonary neg pulmonary ROS,  breath sounds clear to auscultation  Pulmonary exam normal       Cardiovascular negative cardio ROS  Rhythm:regular Rate:Normal     Neuro/Psych negative neurological ROS  negative psych ROS   GI/Hepatic negative GI ROS, Neg liver ROS,   Endo/Other  negative endocrine ROS  Renal/GU negative Renal ROS     Musculoskeletal   Abdominal   Peds  Hematology negative hematology ROS (+)   Anesthesia Other Findings Placenta previa antepartum 2010 Placenta moved 3 days before baby was born Abnormal Pap smear 2010 Repeat pap done;Hysteroscopy;Last pap 2012;was normal    Infection   BV;may get frequently Anemia 2010 Iron supplements PP    Asthma   Has a nebulizer;triggered by stress Stroke 2013 MD thought may have had a stroke and heart attack;has neurologist @ [facility]    Heart attack   MD thinks may have had a heart attack;Cardiologist @ [facility] Nerve pain   Chronic;d/t possible stroke and MI    Sickle cell trait     Headache(784.0)        History of CVA (cerebrovascular accident) 04/09/2013 Pt reports presumptive "stroke" when she was 27y.o.; followed by [facility] neurology for "chronic nerve pain" and has been Rx'd Hydrocodone and Mobic   All above most likely due to conversion disorder per Dr. Su Hilt   Reproductive/Obstetrics (+) Pregnancy                           Anesthesia Physical Anesthesia Plan  ASA: III  Anesthesia Plan: Epidural   Post-op Pain Management:    Induction:   Airway Management Planned:   Additional Equipment:   Intra-op Plan:   Post-operative Plan:    Informed Consent: I have reviewed the patients History and Physical, chart, labs and discussed the procedure including the risks, benefits and alternatives for the proposed anesthesia with the patient or authorized representative who has indicated his/her understanding and acceptance.     Plan Discussed with:   Anesthesia Plan Comments:        L sided nerve pain in lower extremity Anesthesia Quick Evaluation

## 2013-10-19 NOTE — Progress Notes (Signed)
Melissa [name] is a 27 y.o. G2P1001 at [redacted]w[redacted]d admitted for active labor  Subjective: Feeling lots of pain  Objective: BP 136/79  Pulse 91  Temp(Src) 98.2 F (36.8 C) (Oral)  Resp 20  Ht 5\' 5"  (1.651 m)  Wt 89.631 kg (197 lb 9.6 oz)  BMI 32.88 kg/m2  SpO2 99%  LMP 01/13/2013      FHT:  FHR: 120s-130s bpm, variability: moderate,  accelerations:  Present,  decelerations:  Absent UC:   regular, every 1-3 minutes SVE:   Dilation: 6 Effacement (%): 100 Station: 0 Exam by:: Lance Morin MD  AROM'd by SL clear fluid about 5:30am Labs: Lab Results  Component Value Date   WBC 7.6 10/19/2013   HGB 12.0 10/19/2013   HCT 35.4* 10/19/2013   MCV 81.0 10/19/2013   PLT 125* 10/19/2013    Assessment / Plan: Spontaneous labor, progressing normally.  Additionally, pt's report of h/o CVA was dx'd as a psychogenic left hemiparesis conversion disorder.  Labor: Progressing normally Preeclampsia:  no signs or symptoms of toxicity Fetal Wellbeing:  Category I Pain Control:  Consulting anesthesia for epidural now I/D:  GBS neg Anticipated MOD:  NSVD  [name] Y 10/19/2013, 8:59 AM

## 2013-10-19 NOTE — Anesthesia Procedure Notes (Signed)
Epidural Patient location during procedure: OB Start time: 10/19/2013 9:27 AM  Staffing Anesthesiologist: Angus Seller., Harrell Gave. Performed by: anesthesiologist   Preanesthetic Checklist Completed: patient identified, site marked, surgical consent, pre-op evaluation, timeout performed, IV checked, risks and benefits discussed and monitors and equipment checked  Epidural Patient position: sitting Prep: site prepped and draped and DuraPrep Patient monitoring: continuous pulse ox and blood pressure Approach: midline Injection technique: LOR air  Needle:  Needle type: Tuohy  Needle gauge: 17 G Needle length: 9 cm and 9 Needle insertion depth: 5 cm cm Catheter type: closed end flexible Catheter size: 19 Gauge Catheter at skin depth: 10 cm Test dose: negative  Assessment Events: blood not aspirated, injection not painful, no injection resistance, negative IV test and no paresthesia  Additional Notes Patient identified.  Risk benefits discussed including failed block, incomplete pain control, headache, nerve damage, paralysis, blood pressure changes, nausea, vomiting, reactions to medication both toxic or allergic, and postpartum back pain.  Patient expressed understanding and wished to proceed.  All questions were answered.  Sterile technique used throughout procedure and epidural site dressed with sterile barrier dressing. No paresthesia or other complications noted.The patient did not experience any signs of intravascular injection such as tinnitus or metallic taste in mouth nor signs of intrathecal spread such as rapid motor block. Please see nursing notes for vital signs.

## 2013-10-20 LAB — CBC
HCT: 32.2 % — ABNORMAL LOW (ref 36.0–46.0)
MCH: 27.4 pg (ref 26.0–34.0)
MCHC: 32.9 g/dL (ref 30.0–36.0)
MCV: 83.2 fL (ref 78.0–100.0)
Platelets: 123 10*3/uL — ABNORMAL LOW (ref 150–400)
RBC: 3.87 MIL/uL (ref 3.87–5.11)
WBC: 12.4 10*3/uL — ABNORMAL HIGH (ref 4.0–10.5)

## 2013-10-20 MED ORDER — IBUPROFEN 800 MG PO TABS: 800.0000 mg | ORAL_TABLET | Freq: Three times a day (TID) | ORAL | Status: DC | PRN

## 2013-10-20 NOTE — Discharge Summary (Addendum)
Vaginal Delivery Discharge Summary  Melissa [name]  DOB:    Sep 30, 1986 MRN:    [id number] CSN:    [id number]  Date of admission:                  10/18/2013  Date of discharge:                   10/20/2013  Procedures this admission:  Date of Delivery: 10/19/2013 vaginal delivery by Dr. Osborn Coho  Newborn Data:  Live born female  Birth Weight: 8 lb 10 oz (3912 g) APGAR: 6, 9  Home with mother. Name: Berna Spare Circumcision Plan: Outpatient  History of Present Illness:  Ms. Melissa [name] is a 27 y.o. female, G2P2002, who presents at [redacted]w[redacted]d weeks gestation. The patient has been followed at the [facility] and Gynecology division of [facility]. She was admitted onset of labor. Her pregnancy has been complicated by: A questionable past history of heart disease and a stroke.  asthma, and sickle cell trait .  Hospital course:  The patient was admitted for labor.   Her labor was not complicated. She proceeded to have a vaginal delivery of a healthy infant. Her delivery was complicated by a mild shoulder dystocia. Her postpartum course was not complicated.  She was discharged to home on postpartum day 1 doing well.  Feeding:  breast  Contraception:  Nexplanon  Discharge hemoglobin:  Hemoglobin  Date Value Range Status  10/20/2013 10.6* 12.0 - 15.0 g/dL Final     HCT  Date Value Range Status  10/20/2013 32.2* 36.0 - 46.0 % Final    Discharge Physical Exam:   General: no distress Lochia: appropriate Uterine Fundus: firm Incision: NA DVT Evaluation: No evidence of DVT seen on physical exam.  Intrapartum Procedures: spontaneous vaginal delivery Postpartum Procedures: none Complications-Operative and Postpartum: First degree labial lacerations  Discharge Diagnoses: Term Pregnancy-delivered and Anemia, asthma, sickle cell trait, questionable past history of a heart attack and a stroke.   Discharge Information:  Activity:            pelvic rest Diet:                routine Medications: PNV, Ibuprofen and Iron Condition:      stable and improved Instructions:   Postpartum Care After Vaginal Delivery  After you deliver your newborn (postpartum period), the usual stay in the hospital is 24 72 hours. If there were problems with your labor or delivery, or if you have other medical problems, you might be in the hospital longer.  While you are in the hospital, you will receive help and instructions on how to care for yourself and your newborn during the postpartum period.  While you are in the hospital:  Be sure to tell your nurses if you have pain or discomfort, as well as where you feel the pain and what makes the pain worse.  If you had an incision made near your vagina (episiotomy) or if you had some tearing during delivery, the nurses may put ice packs on your episiotomy or tear. The ice packs may help to reduce the pain and swelling.  If you are breastfeeding, you may feel uncomfortable contractions of your uterus for a couple of weeks. This is normal. The contractions help your uterus get back to normal size.  It is normal to have some bleeding after delivery.  For the first 1 3 days after delivery, the flow is red and  the amount may be similar to a period.  It is common for the flow to start and stop.  In the first few days, you may pass some small clots. Let your nurses know if you begin to pass large clots or your flow increases.  Do not  flush blood clots down the toilet before having the nurse look at them.  During the next 3 10 days after delivery, your flow should become more watery and pink or brown-tinged in color.  Ten to fourteen days after delivery, your flow should be a small amount of yellowish-white discharge.  The amount of your flow will decrease over the first few weeks after delivery. Your flow may stop in 6 8 weeks. Most women have had their flow stop by 12 weeks after delivery.  You should  change your sanitary pads frequently.  Wash your hands thoroughly with soap and water for at least 20 seconds after changing pads, using the toilet, or before holding or feeding your newborn.  You should feel like you need to empty your bladder within the first 6 8 hours after delivery.  In case you become weak, lightheaded, or faint, call your nurse before you get out of bed for the first time and before you take a shower for the first time.  Within the first few days after delivery, your breasts may begin to feel tender and full. This is called engorgement. Breast tenderness usually goes away within 48 72 hours after engorgement occurs. You may also notice milk leaking from your breasts. If you are not breastfeeding, do not stimulate your breasts. Breast stimulation can make your breasts produce more milk.  Spending as much time as possible with your newborn is very important. During this time, you and your newborn can feel close and get to know each other. Having your newborn stay in your room (rooming in) will help to strengthen the bond with your newborn. It will give you time to get to know your newborn and become comfortable caring for your newborn.  Your hormones change after delivery. Sometimes the hormone changes can temporarily cause you to feel sad or tearful. These feelings should not last more than a few days. If these feelings last longer than that, you should talk to your caregiver.  If desired, talk to your caregiver about methods of family planning or contraception.  Talk to your caregiver about immunizations. Your caregiver may want you to have the following immunizations before leaving the hospital:  Tetanus, diphtheria, and pertussis (Tdap) or tetanus and diphtheria (Td) immunization. It is very important that you and your family (including grandparents) or others caring for your newborn are up-to-date with the Tdap or Td immunizations. The Tdap or Td immunization can help  protect your newborn from getting ill.  Rubella immunization.  Varicella (chickenpox) immunization.  Influenza immunization. You should receive this annual immunization if you did not receive the immunization during your pregnancy. Document Released: 10/14/2007 Document Revised: 09/10/2012 Document Reviewed: 08/13/2012 [facility] Patient Information 2014 [place], [place].   Postpartum Depression and Baby Blues  The postpartum period begins right after the birth of a baby. During this time, there is often a great amount of joy and excitement. It is also a time of considerable changes in the life of the parent(s). Regardless of how many times a mother gives birth, each child brings new challenges and dynamics to the family. It is not unusual to have feelings of excitement accompanied by confusing shifts in moods, emotions, and  thoughts. All mothers are at risk of developing postpartum depression or the "baby blues." These mood changes can occur right after giving birth, or they may occur many months after giving birth. The baby blues or postpartum depression can be mild or severe. Additionally, postpartum depression can resolve rather quickly, or it can be a long-term condition. CAUSES Elevated hormones and their rapid decline are thought to be a main cause of postpartum depression and the baby blues. There are a number of hormones that radically change during and after pregnancy. Estrogen and progesterone usually decrease immediately after delivering your baby. The level of thyroid hormone and various cortisol steroids also rapidly drop. Other factors that play a major role in these changes include major life events and genetics.  RISK FACTORS If you have any of the following risks for the baby blues or postpartum depression, know what symptoms to watch out for during the postpartum period. Risk factors that may increase the likelihood of getting the baby blues or postpartum depression  include:  Havinga personal or family history of depression.  Having depression while being pregnant.  Having premenstrual or oral contraceptive-associated mood issues.  Having exceptional life stress.  Having marital conflict.  Lacking a social support network.  Having a baby with special needs.  Having health problems such as diabetes. SYMPTOMS Baby blues symptoms include:  Brief fluctuations in mood, such as going from extreme happiness to sadness.  Decreased concentration.  Difficulty sleeping.  Crying spells, tearfulness.  Irritability.  Anxiety. Postpartum depression symptoms typically begin within the first month after giving birth. These symptoms include:  Difficulty sleeping or excessive sleepiness.  Marked weight loss.  Agitation.  Feelings of worthlessness.  Lack of interest in activity or food. Postpartum psychosis is a very concerning condition and can be dangerous. Fortunately, it is rare. Displaying any of the following symptoms is cause for immediate medical attention. Postpartum psychosis symptoms include:  Hallucinations and delusions.  Bizarre or disorganized behavior.  Confusion or disorientation. DIAGNOSIS  A diagnosis is made by an evaluation of your symptoms. There are no medical or lab tests that lead to a diagnosis, but there are various questionnaires that a caregiver may use to identify those with the baby blues, postpartum depression, or psychosis. Often times, a screening tool called the [place] Postnatal Depression Scale is used to diagnose depression in the postpartum period.  TREATMENT The baby blues usually goes away on its own in 1 to 2 weeks. Social support is often all that is needed. You should be encouraged to get adequate sleep and rest. Occasionally, you may be given medicines to help you sleep.  Postpartum depression requires treatment as it can last several months or longer if it is not treated. Treatment may include  individual or group therapy, medicine, or both to address any social, physiological, and psychological factors that may play a role in the depression. Regular exercise, a healthy diet, rest, and social support may also be strongly recommended.  Postpartum psychosis is more serious and needs treatment right away. Hospitalization is often needed. HOME CARE INSTRUCTIONS  Get as much rest as you can. Nap when the baby sleeps.  Exercise regularly. Some women find yoga and walking to be beneficial.  Eat a balanced and nourishing diet.  Do little things that you enjoy. Have a cup of tea, take a bubble bath, read your favorite magazine, or listen to your favorite music.  Avoid alcohol.  Ask for help with household chores, cooking, grocery shopping, or running  errands as needed. Do not try to do everything.  Talk to people close to you about how you are feeling. Get support from your partner, family members, friends, or other new moms.  Try to stay positive in how you think. Think about the things you are grateful for.  Do not spend a lot of time alone.  Only take medicine as directed by your caregiver.  Keep all your postpartum appointments.  Let your caregiver know if you have any concerns. SEEK MEDICAL CARE IF: You are having a reaction or problems with your medicine. SEEK IMMEDIATE MEDICAL CARE IF:  You have suicidal feelings.  You feel you may harm the baby or someone else. Document Released: 09/20/2004 Document Revised: 03/10/2012 Document Reviewed: 10/23/2011 [facility] Patient Information 2014 [place], [place].   Discharge to: home  Follow-up Information   Follow up with [facility] In 6 weeks.   Contact information:   [address] [place] [address]        Melissa [name] 10/20/2013

## 2013-10-20 NOTE — Progress Notes (Signed)
Post Partum Day 1 Subjective: no complaints and tolerating PO  Objective: Blood pressure 109/70, pulse 83, temperature 97.9 F (36.6 C), temperature source Axillary, resp. rate 18, height 5\' 5"  (1.651 m), weight 197 lb 9.6 oz (89.631 kg), last menstrual period 01/13/2013, SpO2 99.00%, unknown if currently breastfeeding.  Physical Exam:  General: alert and no distress Lochia: appropriate Uterine Fundus: firm Incision: NA DVT Evaluation: No evidence of DVT seen on physical exam.   Recent Labs  10/19/13 0150 10/20/13 0545  HGB 12.0 10.6*  HCT 35.4* 32.2*    Assessment/Plan: Plan for discharge tomorrow, Breastfeeding and Contraception Nexplanon   LOS: 2 days   Romar Woodrick V 10/20/2013, 10:44 AM

## 2013-10-20 NOTE — Anesthesia Postprocedure Evaluation (Signed)
  Anesthesia Post-op Note  Anesthesia Post Note  Patient: Melissa [name]  Procedure(s) Performed: * No procedures listed *  Anesthesia type: Epidural  Patient location: Mother/Baby  Post pain: Pain level controlled  Post assessment: Post-op Vital signs reviewed  Last Vitals:  Filed Vitals:   10/20/13 0600  BP: 109/70  Pulse: 83  Temp: 36.6 C  Resp: 18    Post vital signs: Reviewed  Level of consciousness:alert  Complications: No apparent anesthesia complications

## 2013-10-21 ENCOUNTER — Inpatient Hospital Stay (HOSPITAL_COMMUNITY): Admission: RE | Admit: 2013-10-21 | Payer: 59 | Source: Ambulatory Visit

## 2013-10-28 ENCOUNTER — Encounter (HOSPITAL_COMMUNITY): Payer: Self-pay | Admitting: *Deleted

## 2013-10-28 ENCOUNTER — Inpatient Hospital Stay (HOSPITAL_COMMUNITY)
Admission: AD | Admit: 2013-10-28 | Discharge: 2013-10-29 | DRG: 776 | Disposition: A | Discharge status: Home / Self Care | Payer: 59 | Source: Ambulatory Visit | Attending: Obstetrics and Gynecology | Admitting: Obstetrics and Gynecology

## 2013-10-28 DIAGNOSIS — O99893 Other specified diseases and conditions complicating puerperium: Principal | ICD-10-CM | POA: Diagnosis present

## 2013-10-28 DIAGNOSIS — Z8673 Personal history of transient ischemic attack (TIA), and cerebral infarction without residual deficits: Secondary | ICD-10-CM

## 2013-10-28 DIAGNOSIS — H539 Unspecified visual disturbance: Secondary | ICD-10-CM | POA: Diagnosis present

## 2013-10-28 DIAGNOSIS — R030 Elevated blood-pressure reading, without diagnosis of hypertension: Secondary | ICD-10-CM | POA: Diagnosis present

## 2013-10-28 DIAGNOSIS — IMO0001 Reserved for inherently not codable concepts without codable children: Secondary | ICD-10-CM | POA: Diagnosis present

## 2013-10-28 DIAGNOSIS — R519 Headache, unspecified: Secondary | ICD-10-CM | POA: Diagnosis present

## 2013-10-28 DIAGNOSIS — G43909 Migraine, unspecified, not intractable, without status migrainosus: Secondary | ICD-10-CM | POA: Diagnosis present

## 2013-10-28 LAB — PROTEIN / CREATININE RATIO, URINE
Protein Creatinine Ratio: 0.17 — ABNORMAL HIGH (ref 0.00–0.15)
Total Protein, Urine: 28.1 mg/dL

## 2013-10-28 LAB — CBC WITH DIFFERENTIAL/PLATELET
Lymphocytes Relative: 42 % (ref 12–46)
Lymphs Abs: 2.3 10*3/uL (ref 0.7–4.0)
MCHC: 33.5 g/dL (ref 30.0–36.0)
MCV: 81.9 fL (ref 78.0–100.0)
Neutro Abs: 2.6 10*3/uL (ref 1.7–7.7)
Neutrophils Relative %: 48 % (ref 43–77)
Platelets: 241 10*3/uL (ref 150–400)
RBC: 4.85 MIL/uL (ref 3.87–5.11)
WBC: 5.5 10*3/uL (ref 4.0–10.5)

## 2013-10-28 LAB — URINALYSIS, ROUTINE W REFLEX MICROSCOPIC
Ketones, ur: NEGATIVE mg/dL
Specific Gravity, Urine: 1.025 (ref 1.005–1.030)
Urobilinogen, UA: 0.2 mg/dL (ref 0.0–1.0)

## 2013-10-28 LAB — COMPREHENSIVE METABOLIC PANEL
ALT: 16 U/L (ref 0–35)
AST: 18 U/L (ref 0–37)
Alkaline Phosphatase: 132 U/L — ABNORMAL HIGH (ref 39–117)
BUN: 8 mg/dL (ref 6–23)
CO2: 25 mEq/L (ref 19–32)
Chloride: 106 mEq/L (ref 96–112)
GFR calc non Af Amer: >90 mL/min (ref >90)
Potassium: 3.4 mEq/L — ABNORMAL LOW (ref 3.5–5.1)
Sodium: 142 mEq/L (ref 135–145)
Total Bilirubin: 0.6 mg/dL (ref 0.3–1.2)

## 2013-10-28 LAB — URINE MICROSCOPIC-ADD ON

## 2013-10-28 LAB — LACTATE DEHYDROGENASE: LDH: 271 U/L — ABNORMAL HIGH (ref 94–250)

## 2013-10-28 MED ORDER — BUTALBITAL-APAP-CAFFEINE 50-325-40 MG PO TABS
1.0000 | ORAL_TABLET | Freq: Once | ORAL | Status: AC
  Administered 2013-10-28: 1 via ORAL
  Filled 2013-10-28: qty 1

## 2013-10-28 MED ORDER — ALUM & MAG HYDROXIDE-SIMETH 200-200-20 MG/5ML PO SUSP
30.0000 mL | Freq: Once | ORAL | Status: AC
  Administered 2013-10-28: 30 mL via ORAL
  Filled 2013-10-28: qty 30

## 2013-10-28 MED ORDER — NIFEDIPINE ER OSMOTIC RELEASE 30 MG PO TB24
30.0000 mg | ORAL_TABLET | Freq: Every day | ORAL | Status: AC
  Administered 2013-10-28: 30 mg via ORAL
  Filled 2013-10-28: qty 1

## 2013-10-28 MED ORDER — LACTATED RINGERS IV SOLN
INTRAVENOUS | Status: DC
  Administered 2013-10-29: 02:00:00 via INTRAVENOUS

## 2013-10-28 MED ORDER — PROMETHAZINE HCL 25 MG/ML IJ SOLN
Freq: Once | INTRAVENOUS | Status: AC
  Administered 2013-10-29: via INTRAVENOUS
  Filled 2013-10-28: qty 500

## 2013-10-28 MED ORDER — NIFEDIPINE ER OSMOTIC RELEASE 30 MG PO TB24: 30.0000 mg | ORAL_TABLET | Freq: Every day | ORAL | Status: DC

## 2013-10-28 MED ORDER — HYDROCODONE-ACETAMINOPHEN 5-325 MG PO TABS
1.0000 | ORAL_TABLET | Freq: Four times a day (QID) | ORAL | Status: DC | PRN
  Administered 2013-10-28: 2 via ORAL
  Filled 2013-10-28: qty 2

## 2013-10-28 NOTE — MAU Note (Signed)
SVD on 10/20. No problems with BP during the preg.  Home nurse by today, BP was elevated, office notified and told pt to come in. Has slight headache. Denies visual changes or epigastric pain.  Baby is doing well, breast and bottle feeding, milk is coming in . (positive response )

## 2013-10-28 NOTE — Progress Notes (Signed)
Post Partum Day 10: S/P SVE  Subjective: Received report from dayshift. Pt continues to experience severe HA.  BP lowered.    Objective:   Filed Vitals:   10/28/13 2200 10/28/13 2215 10/28/13 2231 10/28/13 2245  BP: 118/83 111/92 121/83 138/94  Pulse: 87 84 87 97  Temp:      TempSrc:      Resp:      SpO2:   98%    Results for orders placed during the hospital encounter of 10/28/13 (from the past 24 hour(s))  URINALYSIS, ROUTINE W REFLEX MICROSCOPIC     Status: Abnormal   Collection Time    10/28/13  6:00 PM      Result Value Range   Color, Urine YELLOW  YELLOW   APPearance CLEAR  CLEAR   Specific Gravity, Urine 1.025  1.005 - 1.030   pH 6.0  5.0 - 8.0   Glucose, UA NEGATIVE  NEGATIVE mg/dL   Hgb urine dipstick LARGE (*) NEGATIVE   Bilirubin Urine NEGATIVE  NEGATIVE   Ketones, ur NEGATIVE  NEGATIVE mg/dL   Protein, ur NEGATIVE  NEGATIVE mg/dL   Urobilinogen, UA 0.2  0.0 - 1.0 mg/dL   Nitrite NEGATIVE  NEGATIVE   Leukocytes, UA SMALL (*) NEGATIVE  PROTEIN / CREATININE RATIO, URINE     Status: Abnormal   Collection Time    10/28/13  6:00 PM      Result Value Range   Creatinine, Urine 164.56     Total Protein, Urine 28.1     PROTEIN CREATININE RATIO 0.17 (*) 0.00 - 0.15  URINE MICROSCOPIC-ADD ON     Status: Abnormal   Collection Time    10/28/13  6:00 PM      Result Value Range   Squamous Epithelial / LPF FEW (*) RARE   WBC, UA 11-20  <3 WBC/hpf   RBC / HPF 21-50  <3 RBC/hpf   Bacteria, UA FEW (*) RARE  CBC WITH DIFFERENTIAL     Status: None   Collection Time    10/28/13  6:50 PM      Result Value Range   WBC 5.5  4.0 - 10.5 K/uL   RBC 4.85  3.87 - 5.11 MIL/uL   Hemoglobin 13.3  12.0 - 15.0 g/dL   HCT 16.1  09.6 - 04.5 %   MCV 81.9  78.0 - 100.0 fL   MCH 27.4  26.0 - 34.0 pg   MCHC 33.5  30.0 - 36.0 g/dL   RDW 40.9  81.1 - 91.4 %   Platelets 241  150 - 400 K/uL   Neutrophils Relative % 48  43 - 77 %   Neutro Abs 2.6  1.7 - 7.7 K/uL   Lymphocytes Relative 42   12 - 46 %   Lymphs Abs 2.3  0.7 - 4.0 K/uL   Monocytes Relative 7  3 - 12 %   Monocytes Absolute 0.4  0.1 - 1.0 K/uL   Eosinophils Relative 3  0 - 5 %   Eosinophils Absolute 0.2  0.0 - 0.7 K/uL   Basophils Relative 0  0 - 1 %   Basophils Absolute 0.0  0.0 - 0.1 K/uL  LACTATE DEHYDROGENASE     Status: Abnormal   Collection Time    10/28/13  6:50 PM      Result Value Range   LDH 271 (*) 94 - 250 U/L  COMPREHENSIVE METABOLIC PANEL     Status: Abnormal   Collection Time  10/28/13  6:50 PM      Result Value Range   Sodium 142  135 - 145 mEq/L   Potassium 3.4 (*) 3.5 - 5.1 mEq/L   Chloride 106  96 - 112 mEq/L   CO2 25  19 - 32 mEq/L   Glucose, Bld 80  70 - 99 mg/dL   BUN 8  6 - 23 mg/dL   Creatinine, Ser 4.54  0.50 - 1.10 mg/dL   Calcium 9.6  8.4 - 09.8 mg/dL   Total Protein 7.6  6.0 - 8.3 g/dL   Albumin 3.3 (*) 3.5 - 5.2 g/dL   AST 18  0 - 37 U/L   ALT 16  0 - 35 U/L   Alkaline Phosphatase 132 (*) 39 - 117 U/L   Total Bilirubin 0.6  0.3 - 1.2 mg/dL   GFR calc non Af Amer >90  >90 mL/min   GFR calc Af Amer >90  >90 mL/min  URIC ACID     Status: Abnormal   Collection Time    10/28/13  6:50 PM      Result Value Range   Uric Acid, Serum 9.9 (*) 2.4 - 7.0 mg/dL    Assessment/Plan: PP day #10 Elevated BP HA  BP lowered after Procardia XL 30mg  Labs WNL  Fiorecet 1 tab once Treating Pt for Migraine - LR bolus, O2, Phenergan    LOS: 1 day   Gerrie Castiglia 10/29/2013, 1:35 AM

## 2013-10-28 NOTE — MAU Provider Note (Signed)
History   pt presents for evaluation of elevated blood pressures taken by the smart start nurse.  She has had headaches and blurred vision. She has a headache now.  No abdominal or chest pain.  She is breast feeding  CSN: [id number]  Arrival date and time: 10/28/13 1747   None     Chief Complaint  Patient presents with  . Hypertension   HPI  OB History   Grav Para Term Preterm Abortions TAB SAB Ect Mult Living   2 2 2       2       Past Medical History  Diagnosis Date  . Placenta previa antepartum 2010    Placenta moved 3 days before baby was born  . Abnormal Pap smear 2010    Repeat pap done;Hysteroscopy;Last pap 2012;was normal  . Infection     BV;may get frequently  . Anemia 2010    Iron supplements PP  . Asthma     Has a nebulizer;triggered by stress  . Stroke 2013    MD thought may have had a stroke and heart attack;has neurologist @ [facility]  . Heart attack     MD thinks may have had a heart attack;Cardiologist @ [facility]  . Nerve pain     Chronic;d/t possible stroke and MI  . Sickle cell trait   . Headache(784.0)   . History of CVA (cerebrovascular accident) 04/09/2013    Pt reports presumptive "stroke" when she was 27y.o.; followed by [facility] neurology for "chronic nerve pain" and has been Rx'd Hydrocodone and Mobic    Past Surgical History  Procedure Laterality Date  . Hysteroscopy  2010    Family History  Problem Relation Age of Onset  . Sickle cell trait Father   . Hypertension Mother   . Cervical cancer Paternal Aunt   . Cervical cancer Paternal Grandmother   . Anemia Mother   . Diabetes Paternal Grandmother   . Diabetes Paternal Grandfather   . Diabetes Paternal Aunt   . Asthma Brother   . Asthma Daughter   . Asthma Mother     History  Substance Use Topics  . Smoking status: Never Smoker   . Smokeless tobacco: Never Used  . Alcohol Use: Yes     Comment: socially;prior to pregnancy    Allergies:  Allergies  Allergen  Reactions  . Ginger Swelling    Prescriptions prior to admission  Medication Sig Dispense Refill  . ferrous sulfate 325 (65 FE) MG tablet Take 325 mg by mouth daily with breakfast.      . HYDROcodone-acetaminophen (NORCO/VICODIN) 5-325 MG per tablet Take 1 tablet by mouth every 6 (six) hours as needed for pain.      [name] ibuprofen (ADVIL,MOTRIN) 800 MG tablet Take 1 tablet (800 mg total) by mouth every 8 (eight) hours as needed for pain.  50 tablet  1  . Prenatal Vit-Fe Fumarate-FA (PRENATAL MULTIVITAMIN) TABS Take 1 tablet by mouth daily at 12 noon.        ROS Physical Exam   Blood pressure 187/97, pulse 63, temperature 98.6 F (37 C), temperature source Oral, resp. rate 18, last menstrual period 01/13/2013.  Physical Exam Physical Examination: General appearance - alert, well appearing, and in no distress Mental status - alert, oriented to person, place, and time Chest - clear to auscultation, no wheezes, rales or rhonchi, symmetric air entry Heart - normal rate and regular rhythm Abdomen - soft, nontender, nondistended, no masses or organomegaly Fundus firm NT Pelvic -  deferred Extremities - pedal edema 1 +, intact peripheral pulses, Homan's sign negative bilaterally  MAU Course  Procedures  MDM   Assessment and Plan  PP elevated blood pressures and headache Check cbc, cmp, protein /cr ratio Procardia for BP.  She may need IV labetalol vicodin for her headache.    Shalamar Plourde A 10/28/2013, 6:38 PM

## 2013-10-29 ENCOUNTER — Inpatient Hospital Stay (HOSPITAL_COMMUNITY)
Admission: AD | Admit: 2013-10-29 | Discharge: 2013-11-01 | Disposition: A | Discharge status: Home / Self Care | Payer: 59 | Attending: Obstetrics and Gynecology | Admitting: Obstetrics and Gynecology

## 2013-10-29 ENCOUNTER — Encounter (HOSPITAL_COMMUNITY): Payer: Self-pay | Admitting: *Deleted

## 2013-10-29 ENCOUNTER — Encounter (HOSPITAL_COMMUNITY): Payer: Self-pay | Admitting: Internal Medicine

## 2013-10-29 ENCOUNTER — Inpatient Hospital Stay (HOSPITAL_COMMUNITY): Payer: 59

## 2013-10-29 DIAGNOSIS — O1490 Unspecified pre-eclampsia, unspecified trimester: Secondary | ICD-10-CM

## 2013-10-29 DIAGNOSIS — I6350 Cerebral infarction due to unspecified occlusion or stenosis of unspecified cerebral artery: Secondary | ICD-10-CM

## 2013-10-29 DIAGNOSIS — I639 Cerebral infarction, unspecified: Secondary | ICD-10-CM | POA: Diagnosis present

## 2013-10-29 DIAGNOSIS — R030 Elevated blood-pressure reading, without diagnosis of hypertension: Secondary | ICD-10-CM

## 2013-10-29 DIAGNOSIS — IMO0001 Reserved for inherently not codable concepts without codable children: Secondary | ICD-10-CM | POA: Diagnosis present

## 2013-10-29 DIAGNOSIS — J4552 Severe persistent asthma with status asthmaticus: Secondary | ICD-10-CM | POA: Diagnosis present

## 2013-10-29 DIAGNOSIS — J45909 Unspecified asthma, uncomplicated: Secondary | ICD-10-CM

## 2013-10-29 DIAGNOSIS — G43909 Migraine, unspecified, not intractable, without status migrainosus: Secondary | ICD-10-CM | POA: Diagnosis present

## 2013-10-29 LAB — MRSA PCR SCREENING: MRSA by PCR: NEGATIVE

## 2013-10-29 LAB — URINE CULTURE: Colony Count: >=100000

## 2013-10-29 LAB — MAGNESIUM: Magnesium: 2.6 mg/dL — ABNORMAL HIGH (ref 1.5–2.5)

## 2013-10-29 MED ORDER — MAGNESIUM SULFATE 40 G IN LACTATED RINGERS - SIMPLE
1.0000 g/h | INTRAVENOUS | Status: DC
  Filled 2013-10-29: qty 500

## 2013-10-29 MED ORDER — HYDROCODONE-ACETAMINOPHEN 5-325 MG PO TABS
1.0000 | ORAL_TABLET | Freq: Every day | ORAL | Status: DC | PRN
  Administered 2013-10-30: 1 via ORAL
  Filled 2013-10-29: qty 1

## 2013-10-29 MED ORDER — LACTATED RINGERS IV SOLN
INTRAVENOUS | Status: DC
  Administered 2013-10-29: 03:00:00 via INTRAVENOUS

## 2013-10-29 MED ORDER — PNEUMOCOCCAL VAC POLYVALENT 25 MCG/0.5ML IJ INJ
0.5000 mL | INJECTION | INTRAMUSCULAR | Status: AC
  Administered 2013-10-30: 0.5 mL via INTRAMUSCULAR
  Filled 2013-10-29: qty 0.5

## 2013-10-29 MED ORDER — HYDRALAZINE HCL 20 MG/ML IJ SOLN: 10.0000 mg | INTRAMUSCULAR | Status: DC | PRN

## 2013-10-29 MED ORDER — NAPROXEN SODIUM 550 MG PO TABS
550.0000 mg | ORAL_TABLET | Freq: Two times a day (BID) | ORAL | Status: DC
  Administered 2013-10-29: 550 mg via ORAL
  Filled 2013-10-29 (×3): qty 1

## 2013-10-29 MED ORDER — PNEUMOCOCCAL VAC POLYVALENT 25 MCG/0.5ML IJ INJ
0.5000 mL | INJECTION | INTRAMUSCULAR | Status: DC
  Filled 2013-10-29: qty 0.5

## 2013-10-29 MED ORDER — KETOROLAC TROMETHAMINE 30 MG/ML IJ SOLN
30.0000 mg | Freq: Once | INTRAMUSCULAR | Status: AC
  Administered 2013-10-29: 30 mg via INTRAVENOUS
  Filled 2013-10-29: qty 1

## 2013-10-29 MED ORDER — SODIUM CHLORIDE 0.9 % IV SOLN
INTRAVENOUS | Status: DC
  Administered 2013-10-30: 01:00:00 via INTRAVENOUS

## 2013-10-29 MED ORDER — MAGNESIUM SULFATE BOLUS VIA INFUSION
4.0000 g | Freq: Once | INTRAVENOUS | Status: AC
  Administered 2013-10-29: 4 g via INTRAVENOUS
  Filled 2013-10-29: qty 500

## 2013-10-29 MED ORDER — OXYCODONE-ACETAMINOPHEN 5-325 MG PO TABS
2.0000 | ORAL_TABLET | Freq: Once | ORAL | Status: AC
  Administered 2013-10-29: 2 via ORAL
  Filled 2013-10-29: qty 2

## 2013-10-29 MED ORDER — LABETALOL HCL 5 MG/ML IV SOLN
20.0000 mg | INTRAVENOUS | Status: DC | PRN
Start: 2013-10-29 — End: 2013-10-29
  Administered 2013-10-29: 20 mg via INTRAVENOUS
  Filled 2013-10-29: qty 4

## 2013-10-29 MED ORDER — LABETALOL HCL 5 MG/ML IV SOLN
10.0000 mg | Freq: Once | INTRAVENOUS | Status: AC
  Administered 2013-10-29: 40 mg via INTRAVENOUS
  Filled 2013-10-29: qty 8

## 2013-10-29 MED ORDER — ASPIRIN 300 MG RE SUPP: 300.0000 mg | Freq: Every day | RECTAL | Status: DC

## 2013-10-29 MED ORDER — FERROUS SULFATE 325 (65 FE) MG PO TABS
325.0000 mg | ORAL_TABLET | Freq: Every day | ORAL | Status: DC
  Administered 2013-10-30: 325 mg via ORAL
  Filled 2013-10-29 (×2): qty 1

## 2013-10-29 MED ORDER — ASPIRIN 325 MG PO TABS
325.0000 mg | ORAL_TABLET | Freq: Every day | ORAL | Status: DC
  Administered 2013-10-30: 325 mg via ORAL
  Filled 2013-10-29: qty 1

## 2013-10-29 MED ORDER — ALBUTEROL SULFATE HFA 108 (90 BASE) MCG/ACT IN AERS
2.0000 | INHALATION_SPRAY | RESPIRATORY_TRACT | Status: DC | PRN
  Filled 2013-10-29: qty 6.7

## 2013-10-29 MED ORDER — PRENATAL MULTIVITAMIN CH
1.0000 | ORAL_TABLET | Freq: Every day | ORAL | Status: DC
  Administered 2013-10-30 – 2013-11-01 (×3): 1 via ORAL
  Filled 2013-10-29 (×3): qty 1

## 2013-10-29 MED ORDER — ALBUTEROL SULFATE (5 MG/ML) 0.5% IN NEBU
2.5000 mg | INHALATION_SOLUTION | RESPIRATORY_TRACT | Status: DC | PRN
  Filled 2013-10-29: qty 0.5

## 2013-10-29 MED ORDER — PROCHLORPERAZINE EDISYLATE 5 MG/ML IJ SOLN
10.0000 mg | Freq: Four times a day (QID) | INTRAMUSCULAR | Status: DC | PRN
  Administered 2013-10-29: 10 mg via INTRAVENOUS
  Filled 2013-10-29: qty 2

## 2013-10-29 MED ORDER — SENNOSIDES-DOCUSATE SODIUM 8.6-50 MG PO TABS
1.0000 | ORAL_TABLET | Freq: Every evening | ORAL | Status: DC | PRN
  Filled 2013-10-29: qty 1

## 2013-10-29 MED ORDER — SUMATRIPTAN SUCCINATE 100 MG PO TABS
100.0000 mg | ORAL_TABLET | ORAL | Status: DC | PRN
  Administered 2013-10-29 (×2): 100 mg via ORAL
  Filled 2013-10-29 (×3): qty 1

## 2013-10-29 MED ORDER — ASPIRIN EC 325 MG PO TBEC
325.0000 mg | DELAYED_RELEASE_TABLET | Freq: Every day | ORAL | Status: DC
  Administered 2013-10-29: 325 mg via ORAL
  Filled 2013-10-29 (×2): qty 1

## 2013-10-29 NOTE — Progress Notes (Signed)
Report called and given to Tresa Endo, RN at [facility]. Pt transferred via [facility] at 2133.

## 2013-10-29 NOTE — Progress Notes (Signed)
UR chart review completed.  

## 2013-10-29 NOTE — Progress Notes (Signed)
Ms. Melissa [name] is a 27 y.o. year old female.  Subjective:  Headaches continues. Some spots in her eyes. The pt has a long history of migraine headaches. No blurred vision or RUQ tenderness.  Objective:  BP 129/82  Pulse 72  Temp(Src) 98.1 F (36.7 C) (Oral)  Resp 16  Ht 5\' 5"  (1.651 m)  Wt 180 lb 6.4 oz (81.829 kg)  BMI 30.02 kg/m2  SpO2 100%   CBC    Component Value Date/Time   WBC 5.5 10/28/2013 1850   RBC 4.85 10/28/2013 1850   HGB 13.3 10/28/2013 1850   HCT 39.7 10/28/2013 1850   PLT 241 10/28/2013 1850   MCV 81.9 10/28/2013 1850   MCH 27.4 10/28/2013 1850   MCHC 33.5 10/28/2013 1850   RDW 15.5 10/28/2013 1850   LYMPHSABS 2.3 10/28/2013 1850   MONOABS 0.4 10/28/2013 1850   EOSABS 0.2 10/28/2013 1850   BASOSABS 0.0 10/28/2013 1850     Chest:      Clear Heart:       RRR ABD:        Nontender Reflexes: No clonus  Assessment:  Improved BP's Migraine headache  Plan:  Discussed with the Pharmacy. We will discontinue the magnesium for now. If her BP's are stable, then we will give sumatriptan 100 mg with Naproxen. She has tried Percocet, Vicodin, and Fioricet without relief. She may need a Neurology consult.  Leonard Schwartz M.D.  10/29/13  8:38 AM

## 2013-10-29 NOTE — H&P (Signed)
Melissa [name] is a 27 y.o. female, G2P2002 at 10 days PP, presenting for elevated BP, visual disturbances and HA.  PCR WNL however HA persists at 10/10.  Denies visual disturbances or right epigastric pain.  C/w Dr. Richardson Dopp - admit to [facility] for Magnesium.  Patient Active Problem List   Diagnosis Date Noted  . History of CVA (cerebrovascular accident) 04/09/2013  . Chlamydia infection, current pregnancy 02/18/2013  . Hx Endometrial polyp 02/16/2013  . Sickle cell trait 02/16/2013  . Asthma 02/16/2013  . Migraines 02/16/2013     OB History   Grav Para Term Preterm Abortions TAB SAB Ect Mult Living   2 2 2       2      Past Medical History  Diagnosis Date  . Placenta previa antepartum 2010    Placenta moved 3 days before baby was born  . Abnormal Pap smear 2010    Repeat pap done;Hysteroscopy;Last pap 2012;was normal  . Infection     BV;may get frequently  . Anemia 2010    Iron supplements PP  . Asthma     Has a nebulizer;triggered by stress  . Stroke 2013    MD thought may have had a stroke and heart attack;has neurologist @ [facility]  . Heart attack     MD thinks may have had a heart attack;Cardiologist @ [facility]  . Nerve pain     Chronic;d/t possible stroke and MI  . Sickle cell trait   . Headache(784.0)   . History of CVA (cerebrovascular accident) 04/09/2013    Pt reports presumptive "stroke" when she was 27y.o.; followed by [facility] neurology for "chronic nerve pain" and has been Rx'd Hydrocodone and Mobic   Past Surgical History  Procedure Laterality Date  . Hysteroscopy  2010   Family History: family history includes Anemia in her mother; Asthma in her brother, daughter, and mother; Cervical cancer in her paternal aunt and paternal grandmother; Diabetes in her paternal aunt, paternal grandfather, and paternal grandmother; Hypertension in her mother; Sickle cell trait in her father. Social History:  reports that she has never smoked. She has never used smokeless  tobacco. She reports that she drinks alcohol. She reports that she does not use illicit drugs.    ROS: see HPI above, all other systems are negative  Allergies  Allergen Reactions  . Ginger Swelling       Blood pressure 118/77, pulse 85, temperature 98.6 F (37 C), temperature source Oral, resp. rate 18, SpO2 98.00%.  Filed Vitals:   10/28/13 2310 10/28/13 2315 10/29/13 0209 10/29/13 0215  BP:  122/84 118/77 140/80  Pulse: 89 96 85 94  Temp:      TempSrc:      Resp:      SpO2: 100% 100%      Chest clear Heart RRR without murmur Abd NT Ext: WNL  Results for orders placed during the hospital encounter of 10/28/13 (from the past 24 hour(s))  URINALYSIS, ROUTINE W REFLEX MICROSCOPIC     Status: Abnormal   Collection Time    10/28/13  6:00 PM      Result Value Range   Color, Urine YELLOW  YELLOW   APPearance CLEAR  CLEAR   Specific Gravity, Urine 1.025  1.005 - 1.030   pH 6.0  5.0 - 8.0   Glucose, UA NEGATIVE  NEGATIVE mg/dL   Hgb urine dipstick LARGE (*) NEGATIVE   Bilirubin Urine NEGATIVE  NEGATIVE   Ketones, ur NEGATIVE  NEGATIVE mg/dL  Protein, ur NEGATIVE  NEGATIVE mg/dL   Urobilinogen, UA 0.2  0.0 - 1.0 mg/dL   Nitrite NEGATIVE  NEGATIVE   Leukocytes, UA SMALL (*) NEGATIVE  PROTEIN / CREATININE RATIO, URINE     Status: Abnormal   Collection Time    10/28/13  6:00 PM      Result Value Range   Creatinine, Urine 164.56     Total Protein, Urine 28.1     PROTEIN CREATININE RATIO 0.17 (*) 0.00 - 0.15  URINE MICROSCOPIC-ADD ON     Status: Abnormal   Collection Time    10/28/13  6:00 PM      Result Value Range   Squamous Epithelial / LPF FEW (*) RARE   WBC, UA 11-20  <3 WBC/hpf   RBC / HPF 21-50  <3 RBC/hpf   Bacteria, UA FEW (*) RARE  CBC WITH DIFFERENTIAL     Status: None   Collection Time    10/28/13  6:50 PM      Result Value Range   WBC 5.5  4.0 - 10.5 K/uL   RBC 4.85  3.87 - 5.11 MIL/uL   Hemoglobin 13.3  12.0 - 15.0 g/dL   HCT 96.0  45.4 - 09.8 %    MCV 81.9  78.0 - 100.0 fL   MCH 27.4  26.0 - 34.0 pg   MCHC 33.5  30.0 - 36.0 g/dL   RDW 11.9  14.7 - 82.9 %   Platelets 241  150 - 400 K/uL   Neutrophils Relative % 48  43 - 77 %   Neutro Abs 2.6  1.7 - 7.7 K/uL   Lymphocytes Relative 42  12 - 46 %   Lymphs Abs 2.3  0.7 - 4.0 K/uL   Monocytes Relative 7  3 - 12 %   Monocytes Absolute 0.4  0.1 - 1.0 K/uL   Eosinophils Relative 3  0 - 5 %   Eosinophils Absolute 0.2  0.0 - 0.7 K/uL   Basophils Relative 0  0 - 1 %   Basophils Absolute 0.0  0.0 - 0.1 K/uL  LACTATE DEHYDROGENASE     Status: Abnormal   Collection Time    10/28/13  6:50 PM      Result Value Range   LDH 271 (*) 94 - 250 U/L  COMPREHENSIVE METABOLIC PANEL     Status: Abnormal   Collection Time    10/28/13  6:50 PM      Result Value Range   Sodium 142  135 - 145 mEq/L   Potassium 3.4 (*) 3.5 - 5.1 mEq/L   Chloride 106  96 - 112 mEq/L   CO2 25  19 - 32 mEq/L   Glucose, Bld 80  70 - 99 mg/dL   BUN 8  6 - 23 mg/dL   Creatinine, Ser 5.62  0.50 - 1.10 mg/dL   Calcium 9.6  8.4 - 13.0 mg/dL   Total Protein 7.6  6.0 - 8.3 g/dL   Albumin 3.3 (*) 3.5 - 5.2 g/dL   AST 18  0 - 37 U/L   ALT 16  0 - 35 U/L   Alkaline Phosphatase 132 (*) 39 - 117 U/L   Total Bilirubin 0.6  0.3 - 1.2 mg/dL   GFR calc non Af Amer >90  >90 mL/min   GFR calc Af Amer >90  >90 mL/min  URIC ACID     Status: Abnormal   Collection Time    10/28/13  6:50 PM  Result Value Range   Uric Acid, Serum 9.9 (*) 2.4 - 7.0 mg/dL     Assessment/Plan: S/P Vaginal delivery day 10  C/w Dr. Richardson Dopp  Admit to [facility]  Magnesium Sulfate 24 hours Labetalol 20 mg Q 30 min if systolic >160 or diastolic > 110 Strict I&Os CTO BPs and HA  Rowan Blase, MSN 10/29/2013, 2:11 AM

## 2013-10-29 NOTE — Consult Note (Addendum)
Neurology Consultation Reason for Consult: Headache Referring Physician: Kirkland Hun  CC: Headache  History is obtained from: Patient  HPI: Melissa [name] is a 27 y.o. female who is a day 10 post partum complaining of headache and spots in her vision. She states that the headache is holocephalic and slightly "less intense" than her typical migraines. It is associated with photophobia as well as some nausea last night. It is throbbing in character.  She has a possible history had a stroke 2 years ago.   ROS: A 14 point ROS was performed and is negative except as noted in the HPI.  Past Medical History  Diagnosis Date  . Placenta previa antepartum 2010    Placenta moved 3 days before baby was born  . Abnormal Pap smear 2010    Repeat pap done;Hysteroscopy;Last pap 2012;was normal  . Infection     BV;may get frequently  . Anemia 2010    Iron supplements PP  . Asthma     Has a nebulizer;triggered by stress  . Stroke 2013    MD thought may have had a stroke and heart attack;has neurologist @ [facility]  . Heart attack     MD thinks may have had a heart attack;Cardiologist @ [facility]  . Nerve pain     Chronic;d/t possible stroke and MI  . Sickle cell trait   . Headache(784.0)   . History of CVA (cerebrovascular accident) 04/09/2013    Pt reports presumptive "stroke" when she was 27y.o.; followed by [facility] neurology for "chronic nerve pain" and has been Rx'd Hydrocodone and Mobic    Family History: Sickle cell trait Hypertension Diabetes  Social History: Tob: Denies  Exam: Current vital signs: BP 166/93  Pulse 66  Temp(Src) 98.2 F (36.8 C) (Oral)  Resp 18  Ht 5\' 5"  (1.651 m)  Wt 81.829 kg (180 lb 6.4 oz)  BMI 30.02 kg/m2  SpO2 99% Vital signs in last 24 hours: Temp:  [98.1 F (36.7 C)-98.3 F (36.8 C)] 98.2 F (36.8 C) (10/30 1602) Pulse Rate:  [63-122] 66 (10/30 1800) Resp:  [16-18] 18 (10/30 1800) BP: (108-175)/(71-116) 166/93 mmHg (10/30  1800) SpO2:  [82 %-100 %] 99 % (10/30 1800) Weight:  [81.829 kg (180 lb 6.4 oz)] 81.829 kg (180 lb 6.4 oz) (10/30 0247)  General: In bed, NAD CV: Regular rate and rhythm Mental Status: Patient is awake, alert, oriented to person, place, month, year, and situation. Immediate and remote memory are intact. Patient is able to give a clear and coherent history. No signs of aphasia or neglect Cranial Nerves: II: Visual Fields are full. Pupils are equal, round, and reactive to light.  Discs are difficult to visualize. III,IV, VI: EOMI without ptosis or diploplia.  V: Facial sensation is symmetric to temperature VII: Facial movement is symmetric.  VIII: hearing is intact to voice X: Uvula elevates symmetrically XI: Shoulder shrug is symmetric. XII: tongue is midline without atrophy or fasciculations.  Motor: Tone is normal. Bulk is normal. 5/5 strength was present in all four extremities.  Sensory: Sensation is symmetric to light touch and temperature in the arms and legs. Deep Tendon Reflexes: 2+ and symmetric in the biceps and patellae.  Plantars: Toes are downgoing bilaterally.  Cerebellar: FNF and HKS are intact bilaterally Gait: Patient has a stable casual gait. Able to tandem, heel, and toe walk.     I have reviewed labs in epic and the results pertinent to this consultation are: CMP-unremarkable  I have reviewed the  images obtained: Previous MRI brain-unremarkable  Impression: 27 year old female with history of migraines and current headache with visual aura that appears most consistent with a migraine. The fact that it does have slightly different characteristics than her typical migraine would probably to get a CT of her head, but this is normal then I would not pursue any further diagnostic testing.  Recommendations: 1) Toradol plus Compazine 2) if this is ineffective may need to consider Depakote or steroids 3) CT head   Ritta Slot, MD [facility] [phone]  If 7pm- 7am, please page neurology on call at [phone].    Area of decreased attenuation in the frontal lobe, concerning for infarct. She will need further evaluation with MRI and I will start her on antiplatelet therapy. She would not be a candidate for any type of acute thrombolytic therapy given the duration of symptoms even if this does represent stroke.  Ritta Slot, MD [facility] [phone]  If 7pm- 7am, please page neurology on call at [phone].

## 2013-10-29 NOTE — Progress Notes (Signed)
Given probable stroke, I have discussed with OB on call Dr. Stefano Gaul and she would likely benefit from transfer to [facility] for further evaluation. I called [facility] Dr. Toniann Fail who has accepted her in transfer. I discussed with [facility] the need for transfer If there is CVA on her MRI, she will need extensive evaluation. Her headache is currently gone, having responded to the compazine + toradol.   Ritta Slot, MD [facility] [phone]  If 7pm- 7am, please page neurology on call at [phone].

## 2013-10-29 NOTE — H&P (Addendum)
[facility] History and Physical  Genelda NASHIRA MCGLYNN [name]:[id number] DOB: July 26, 1986 DOA: 10/29/2013  Referring physician: Dr. Amada Jupiter, neurologist. PCP: Gwynneth Aliment, MD   Chief Complaint: Patient was transferred from [facility] for stroke.  HPI: Melissa [name] is a 27 y.o. female who is 10 days postpartum who was admitted at [facility] yesterday after patient was found to be having elevated blood pressure with headache. Patient states that she was noticed to have high blood pressure by home health aid 7 days after delivery during routine home visit. Her gynecologist was contacted and patient was prescribed Procardia. Patient was yet to take the medications before which a repeat visit by the home health aide showed that her blood pressure was consistently high and patient was referred to the [facility] and was admitted. Patient has headache globally. Patient was initially admitted and started on IV magnesium and placed on when necessary labetalol. Patient was eventually given sumatriptan empirically for possible migraine as patient has had previous history of migraine as teenager. Despite giving these medications patient still had headache and started having blurred vision. On-call neurologist Dr. Amada Jupiter was consulted. CT head was ordered which showed features concerning for acute stroke. Patient was transferred to [facility] for further management. Patient on exam is nonfocal. Denies any weakness of upper or lower extremities difficulty swallowing or speaking. Presently patient's headache is resolved and has no blurred vision. Patient otherwise denies any chest pain or shortness of breath nausea vomiting abdominal pain or diarrhea or dysuria.   Review of Systems: As presented in the history of presenting illness, rest negative.  Past Medical History  Diagnosis Date  . Placenta previa antepartum 2010    Placenta moved 3 days before baby was born  . Abnormal Pap  smear 2010    Repeat pap done;Hysteroscopy;Last pap 2012;was normal  . Infection     BV;may get frequently  . Anemia 2010    Iron supplements PP  . Asthma     Has a nebulizer;triggered by stress  . Stroke 2013    MD thought may have had a stroke and heart attack;has neurologist @ [facility]  . Heart attack     MD thinks may have had a heart attack;Cardiologist @ [facility]  . Nerve pain     Chronic;d/t possible stroke and MI  . Sickle cell trait   . Headache(784.0)   . History of CVA (cerebrovascular accident) 04/09/2013    Pt reports presumptive "stroke" when she was 27y.o.; followed by [facility] neurology for "chronic nerve pain" and has been Rx'd Hydrocodone and Mobic   Past Surgical History  Procedure Laterality Date  . Hysteroscopy  2010   Social History:  reports that she has never smoked. She has never used smokeless tobacco. She reports that she drinks alcohol. She reports that she does not use illicit drugs. Where does patient live home. Can patient participate in ADLs?yes.  Allergies  Allergen Reactions  . Ginger Swelling    Family History:  Family History  Problem Relation Age of Onset  . Sickle cell trait Father   . Hypertension Mother   . Cervical cancer Paternal Aunt   . Cervical cancer Paternal Grandmother   . Anemia Mother   . Diabetes Paternal Grandmother   . Diabetes Paternal Grandfather   . Diabetes Paternal Aunt   . Asthma Brother   . Asthma Daughter   . Asthma Mother       Prior to Admission medications   Medication Sig  Start Date End Date Taking? Authorizing Provider  albuterol (PROVENTIL HFA;VENTOLIN HFA) 108 (90 BASE) MCG/ACT inhaler Inhale 2 puffs into the lungs every 4 (four) hours as needed for wheezing or shortness of breath.   Yes Historical Provider, MD  albuterol (PROVENTIL) (2.5 MG/3ML) 0.083% nebulizer solution Take 2.5 mg by nebulization every 4 (four) hours as needed for wheezing or shortness of breath.   Yes Historical  Provider, MD  ferrous sulfate 325 (65 FE) MG tablet Take 325 mg by mouth daily with breakfast.   Yes Historical Provider, MD  HYDROcodone-acetaminophen (NORCO/VICODIN) 5-325 MG per tablet Take 1 tablet by mouth daily as needed for pain.    Yes Historical Provider, MD  ibuprofen (ADVIL,MOTRIN) 200 MG tablet Take 800 mg by mouth daily as needed for pain.   Yes Historical Provider, MD  Prenatal Vit-Fe Fumarate-FA (PRENATAL MULTIVITAMIN) TABS Take 1 tablet by mouth daily.    Yes Historical Provider, MD  NIFEdipine (PROCARDIA XL) 30 MG 24 hr tablet Take 1 tablet (30 mg total) by mouth daily. 10/28/13   Haroldine Laws, CNM    Physical Exam: Filed Vitals:   10/29/13 2213  BP: 95/80  Pulse: 78  Temp: 98.3 F (36.8 C)  TempSrc: Axillary  Height: 5\' 5"  (1.651 m)  Weight: 82.781 kg (182 lb 8 oz)  SpO2: 99%     General:  Well-developed and nourished.  Eyes: Anicteric no pallor.  ENT: No discharge from ears or nose mouth.  Neck: No mass felt.  Cardiovascular: S1-S2 heard.  Respiratory: No rhonchi or crepitations.  Abdomen: Soft nontender bowel sound present.  Skin: No rash.  Musculoskeletal: No edema.  Psychiatric: Appears normal.  Neurologic: Alert awake oriented to time place and person. Moves all extremities 5 x 5. No facial asymmetry. Tongue is midline.  Labs on Admission:  Basic Metabolic Panel:  Recent Labs Lab 10/28/13 1850 10/29/13 0930  NA 142  --   K 3.4*  --   CL 106  --   CO2 25  --   GLUCOSE 80  --   BUN 8  --   CREATININE 0.78  --   CALCIUM 9.6  --   MG  --  2.6*   Liver Function Tests:  Recent Labs Lab 10/28/13 1850  AST 18  ALT 16  ALKPHOS 132*  BILITOT 0.6  PROT 7.6  ALBUMIN 3.3*   No results found for this basename: LIPASE, AMYLASE,  in the last 168 hours No results found for this basename: AMMONIA,  in the last 168 hours CBC:  Recent Labs Lab 10/28/13 1850  WBC 5.5  NEUTROABS 2.6  HGB 13.3  HCT 39.7  MCV 81.9  PLT 241    Cardiac Enzymes: No results found for this basename: CKTOTAL, CKMB, CKMBINDEX, TROPONINI,  in the last 168 hours  BNP (last 3 results) No results found for this basename: PROBNP,  in the last 8760 hours CBG: No results found for this basename: GLUCAP,  in the last 168 hours  Radiological Exams on Admission: Ct Head Wo Contrast  10/29/2013   CLINICAL DATA:  Persistent headache after vaginal delivery  EXAM: CT HEAD WITHOUT CONTRAST  TECHNIQUE: Contiguous axial images were obtained from the base of the skull through the vertex without intravenous contrast.  COMPARISON:  brain CT July 08, 2010 and brain MRI July 11, 2012  FINDINGS: The ventricles are normal in size and configuration. There is no mass, hemorrhage, extra-axial fluid collection, or midline shift.  There is an area of  decreased attenuation in the anterior, inferior to mid left frontal lobe a concerning for acute infarct. The gray and white compartments otherwise appear normal.  Bony calvarium appears intact. The mastoid air cells are clear.  IMPRESSION: Area of decreased attenuation anterior left frontal lobe, concerning for acute infarct. Study otherwise unremarkable. In particular, no demonstrable acute hemorrhage.   Electronically Signed   By: Bretta Bang M.D.   On: 10/29/2013 17:54     Assessment/Plan Principal Problem:   CVA (cerebral infarction) Active Problems:   Asthma   Elevated blood pressure   1. Possible acute to subacute CVA - as per the neurologist patient was not a candidate for TPA. Patient has been placed on neurochecks. MRI/MRA brain 2-D echo and carotid Dopplers has been ordered. Hypercoagulable workup. Aspirin. Further recommendations per neurologist. 2. History of asthma - presently not wheezing. 3. Elevated blood pressure - recent blood pressure is normotensive. Closely observe. Will not aggressively treat with antihypertensives for next 24 hours given the possibility of acute CVA. As per the  physicians at [facility] health patient does not have preeclampsia.    Code Status: Full code.  Family Communication: Patient's mother at the bedside.  Disposition Plan: Admit to inpatient.    Richell Corker N. [facility] Pager [phone].  If 7PM-7AM, please contact night-coverage [email or web address] Password [facility] 10/29/2013, 11:33 PM

## 2013-10-29 NOTE — Progress Notes (Signed)
Post Partum Day 10: S/P SVE  Subjective: Patient denies visual disturbances or right epigastric pain.  Reports HA 10/10 on pain scale.   Objective: Blood pressure 138/94, pulse 97, temperature 98.6 F (37 C), temperature source Oral, resp. rate 18, SpO2 98.00%.    Assessment/Plan: S/P Vaginal delivery day 10 C/w Dr. Richardson Dopp Percocet 2 tabs once   CTO   LOS: 1 day   Infant Doane 10/29/2013, 1:30 AM

## 2013-10-29 NOTE — Progress Notes (Addendum)
27 y.o. year old female, post partum.  SUBJECTIVE:  Continued headache after sumatriptan. 8/10.  OBJECTIVE:  BP 165/102  Pulse 74  Temp(Src) 98.3 F (36.8 C) (Oral)  Resp 16  Ht 5\' 5"  (1.651 m)  Wt 180 lb 6.4 oz (81.829 kg)  BMI 30.02 kg/m2  SpO2 99%  Chest: Clear Heart: RRR Abd: nontender  ASSESSMENT:  Migraine headache  Elevated BP. Not preeclampsia.  PLAN:  Neurology consult called. Dr. Petra Kuba will see the patient today.  Leonard Schwartz, M.D.

## 2013-10-30 ENCOUNTER — Inpatient Hospital Stay (HOSPITAL_COMMUNITY): Payer: 59

## 2013-10-30 ENCOUNTER — Encounter (HOSPITAL_COMMUNITY): Payer: Self-pay | Admitting: *Deleted

## 2013-10-30 DIAGNOSIS — R51 Headache: Secondary | ICD-10-CM

## 2013-10-30 LAB — CBC WITH DIFFERENTIAL/PLATELET
Basophils Absolute: 0 10*3/uL (ref 0.0–0.1)
Basophils Relative: 0 % (ref 0–1)
Eosinophils Absolute: 0.2 10*3/uL (ref 0.0–0.7)
Eosinophils Relative: 4 % (ref 0–5)
HCT: 39.2 % (ref 36.0–46.0)
Hemoglobin: 13 g/dL (ref 12.0–15.0)
Lymphocytes Relative: 46 % (ref 12–46)
MCHC: 33.2 g/dL (ref 30.0–36.0)
MCV: 84.8 fL (ref 78.0–100.0)
Monocytes Absolute: 0.3 10*3/uL (ref 0.1–1.0)
Monocytes Relative: 6 % (ref 3–12)
Neutro Abs: 2.2 10*3/uL (ref 1.7–7.7)
Platelets: 215 10*3/uL (ref 150–400)
WBC: 5 10*3/uL (ref 4.0–10.5)

## 2013-10-30 LAB — URINALYSIS, ROUTINE W REFLEX MICROSCOPIC
Bilirubin Urine: NEGATIVE
Ketones, ur: NEGATIVE mg/dL
Nitrite: NEGATIVE
Specific Gravity, Urine: 1.022 (ref 1.005–1.030)
Urobilinogen, UA: 0.2 mg/dL (ref 0.0–1.0)
pH: 5.5 (ref 5.0–8.0)

## 2013-10-30 LAB — URIC ACID: Uric Acid, Serum: 9.6 mg/dL — ABNORMAL HIGH (ref 2.4–7.0)

## 2013-10-30 LAB — HEPATIC FUNCTION PANEL
ALT: 13 U/L (ref 0–35)
AST: 32 U/L (ref 0–37)
Albumin: 3 g/dL — ABNORMAL LOW (ref 3.5–5.2)
Alkaline Phosphatase: 103 U/L (ref 39–117)
Bilirubin, Direct: <0.1 mg/dL (ref 0.0–0.3)
Total Bilirubin: 0.7 mg/dL (ref 0.3–1.2)

## 2013-10-30 LAB — LIPID PANEL
HDL: 51 mg/dL (ref >39)
LDL Cholesterol: 102 mg/dL — ABNORMAL HIGH (ref 0–99)
VLDL: 17 mg/dL (ref 0–40)

## 2013-10-30 LAB — PROTEIN / CREATININE RATIO, URINE
Creatinine, Urine: 136.22 mg/dL
Protein Creatinine Ratio: 0.64 — ABNORMAL HIGH (ref 0.00–0.15)

## 2013-10-30 LAB — COMPREHENSIVE METABOLIC PANEL
ALT: 12 U/L (ref 0–35)
Albumin: 3 g/dL — ABNORMAL LOW (ref 3.5–5.2)
BUN: 7 mg/dL (ref 6–23)
Calcium: 8.9 mg/dL (ref 8.4–10.5)
GFR calc Af Amer: >90 mL/min (ref >90)
Glucose, Bld: 86 mg/dL (ref 70–99)
Sodium: 143 mEq/L (ref 135–145)
Total Protein: 6.9 g/dL (ref 6.0–8.3)

## 2013-10-30 LAB — URINE MICROSCOPIC-ADD ON

## 2013-10-30 LAB — MRSA PCR SCREENING: MRSA by PCR: NEGATIVE

## 2013-10-30 MED ORDER — LANOLIN HYDROUS EX OINT: TOPICAL_OINTMENT | CUTANEOUS | Status: DC | PRN

## 2013-10-30 MED ORDER — ONDANSETRON HCL 4 MG/2ML IJ SOLN: 4.0000 mg | INTRAMUSCULAR | Status: DC | PRN

## 2013-10-30 MED ORDER — CIPROFLOXACIN HCL 500 MG PO TABS
500.0000 mg | ORAL_TABLET | Freq: Two times a day (BID) | ORAL | Status: DC
  Administered 2013-10-30: 500 mg via ORAL
  Filled 2013-10-30 (×3): qty 1

## 2013-10-30 MED ORDER — STROKE: EARLY STAGES OF RECOVERY BOOK
Freq: Once | Status: AC
  Administered 2013-10-30: 06:00:00
  Filled 2013-10-30: qty 1

## 2013-10-30 MED ORDER — IBUPROFEN 600 MG PO TABS
600.0000 mg | ORAL_TABLET | Freq: Four times a day (QID) | ORAL | Status: DC
  Administered 2013-10-30 – 2013-11-01 (×7): 600 mg via ORAL
  Filled 2013-10-30 (×7): qty 1

## 2013-10-30 MED ORDER — ONDANSETRON HCL 4 MG PO TABS: 4.0000 mg | ORAL_TABLET | ORAL | Status: DC | PRN

## 2013-10-30 MED ORDER — POTASSIUM CHLORIDE CRYS ER 20 MEQ PO TBCR
20.0000 meq | EXTENDED_RELEASE_TABLET | Freq: Two times a day (BID) | ORAL | Status: DC
  Administered 2013-10-30 – 2013-11-01 (×4): 20 meq via ORAL
  Filled 2013-10-30 (×6): qty 1

## 2013-10-30 MED ORDER — NIFEDIPINE ER 30 MG PO TB24
30.0000 mg | ORAL_TABLET | Freq: Every day | ORAL | Status: DC
  Administered 2013-10-30: 30 mg via ORAL
  Filled 2013-10-30 (×2): qty 1

## 2013-10-30 MED ORDER — LACTATED RINGERS IV SOLN
INTRAVENOUS | Status: DC
  Administered 2013-10-30 – 2013-10-31 (×2): via INTRAVENOUS

## 2013-10-30 MED ORDER — MAGNESIUM SULFATE 40 G IN LACTATED RINGERS - SIMPLE
2.0000 g/h | INTRAVENOUS | Status: AC
  Administered 2013-10-30: 2 g/h via INTRAVENOUS
  Filled 2013-10-30: qty 500

## 2013-10-30 MED ORDER — MAGNESIUM SULFATE BOLUS VIA INFUSION
4.0000 g | Freq: Once | INTRAVENOUS | Status: AC
  Administered 2013-10-30: 4 g via INTRAVENOUS
  Filled 2013-10-30: qty 500

## 2013-10-30 MED ORDER — POTASSIUM CHLORIDE CRYS ER 20 MEQ PO TBCR
40.0000 meq | EXTENDED_RELEASE_TABLET | Freq: Once | ORAL | Status: AC
  Administered 2013-10-30: 40 meq via ORAL
  Filled 2013-10-30: qty 2

## 2013-10-30 MED ORDER — HYDRALAZINE HCL 20 MG/ML IJ SOLN: 10.0000 mg | Freq: Three times a day (TID) | INTRAMUSCULAR | Status: DC | PRN

## 2013-10-30 NOTE — Discharge Summary (Addendum)
Physician Discharge Summary  Melissa [name] WUJ:[id number] DOB: 01-21-1986 DOA: 10/29/2013  PCP: Melissa Aliment, MD  Admit date: 10/29/2013 Discharge date: 10/30/2013  Time spent: 35 minutes  Recommendations for Outpatient Follow-up:  1. Patient will be transfer to [facility]   Discharge Diagnoses:    Hypertension, Preeclampsia ?    Asthma   Elevated blood pressure   Discharge Condition: stable.   Diet recommendation:   Filed Weights   10/29/13 2213 10/30/13 0402  Weight: 82.781 kg (182 lb 8 oz) 82.781 kg (182 lb 8 oz)    History of present illness:  Melissa [name] is a 27 y.o. female who is 10 days postpartum who was admitted at [facility] yesterday after patient was found to be having elevated blood pressure with headache. Patient states that she was noticed to have high blood pressure by home health aid 7 days after delivery during routine home visit. Her gynecologist was contacted and patient was prescribed Procardia. Patient was yet to take the medications before which a repeat visit by the home health aide showed that her blood pressure was consistently high and patient was referred to the [facility] and was admitted. Patient has headache globally. Patient was initially admitted and started on IV magnesium and placed on when necessary labetalol. Patient was eventually given sumatriptan empirically for possible migraine as patient has had previous history of migraine as teenager. Despite giving these medications patient still had headache and started having blurred vision. On-call neurologist Melissa [name] was consulted. CT head was ordered which showed features concerning for acute stroke. Patient was transferred to [facility] cone for further management. Patient on exam is nonfocal. Denies any weakness of upper or lower extremities difficulty swallowing or speaking. Presently patient's headache is resolved and has no blurred vision. Patient otherwise denies any  chest pain or shortness of breath nausea vomiting abdominal pain or diarrhea or dysuria.    Hospital Course:  1-Headache: probably migraine. MRI negative for stroke. Headache resolved.  2-Hypertension: continue with nifedipine. Urine protein Cr ratio at 0.6 elevated. Concern for post partum pre-clampsia. [facility] Melissa [name] is the acceptant  physician.  3-Hypokalemia: replace with 40 meq daily.  4-UTI: with moderate leukocytes. Started on cipro empirically. Follow urine culture.   Procedures:    Consultations:  Melissa [name].   Discharge Exam: Filed Vitals:   10/30/13 1416  BP: 156/80  Pulse: 66  Temp: 98.6 F (37 C)  Resp:     General: No distress.  Cardiovascular: S 1, S 2 RRR Respiratory: CTA  Discharge Instructions     Medication List    STOP taking these medications       ibuprofen 200 MG tablet  Commonly known as:  ADVIL,MOTRIN      TAKE these medications       albuterol 108 (90 BASE) MCG/ACT inhaler  Commonly known as:  PROVENTIL HFA;VENTOLIN HFA  Inhale 2 puffs into the lungs every 4 (four) hours as needed for wheezing or shortness of breath.     albuterol (2.5 MG/3ML) 0.083% nebulizer solution  Commonly known as:  PROVENTIL  Take 2.5 mg by nebulization every 4 (four) hours as needed for wheezing or shortness of breath.     ferrous sulfate 325 (65 FE) MG tablet  Take 325 mg by mouth daily with breakfast.     HYDROcodone-acetaminophen 5-325 MG per tablet  Commonly known as:  NORCO/VICODIN  Take 1 tablet by mouth daily as needed for pain.  NIFEdipine 30 MG 24 hr tablet  Commonly known as:  PROCARDIA XL  Take 1 tablet (30 mg total) by mouth daily.     prenatal multivitamin Tabs tablet  Take 1 tablet by mouth daily.       Allergies  Allergen Reactions  . Ginger Swelling       Follow-up Information   Follow up with Melissa Aliment, MD.   Specialty:  Internal Medicine   Contact information:   [address]  [address] [place] [address] [phone]       Follow up with Melissa Limbo, MD.   Specialty:  Obstetrics and Gynecology   Contact information:   [address] [place] [address] [phone]        The results of significant diagnostics from this hospitalization (including imaging, microbiology, ancillary and laboratory) are listed below for reference.    Significant Diagnostic Studies: Ct Head Wo Contrast  10/29/2013   CLINICAL DATA:  Persistent headache after vaginal delivery  EXAM: CT HEAD WITHOUT CONTRAST  TECHNIQUE: Contiguous axial images were obtained from the base of the skull through the vertex without intravenous contrast.  COMPARISON:  brain CT July 08, 2010 and brain MRI July 11, 2012  FINDINGS: The ventricles are normal in size and configuration. There is no mass, hemorrhage, extra-axial fluid collection, or midline shift.  There is an area of decreased attenuation in the anterior, inferior to mid left frontal lobe a concerning for acute infarct. The gray and white compartments otherwise appear normal.  Bony calvarium appears intact. The mastoid air cells are clear.  IMPRESSION: Area of decreased attenuation anterior left frontal lobe, concerning for acute infarct. Study otherwise unremarkable. In particular, no demonstrable acute hemorrhage.   Electronically Signed   By: Bretta Bang M.D.   On: 10/29/2013 17:54   Mr Brain Wo Contrast  10/30/2013   CLINICAL DATA:  Severe headache. Recent the child birth.  EXAM: MRI HEAD WITHOUT CONTRAST  MRA HEAD WITHOUT CONTRAST  TECHNIQUE: Multiplanar, multiecho pulse sequences of the brain and surrounding structures were obtained without intravenous contrast. Angiographic images of the head were obtained using MRA technique without contrast.  COMPARISON:  Head CT 10/29/2013.  MRI 07/11/2012.  FINDINGS: MRI HEAD FINDINGS  The brain has a normal appearance on all pulse sequences without evidence of malformation,  atrophy, old or acute infarction, mass lesion, hemorrhage, hydrocephalus or extra-axial collection. No pituitary mass. No inflammatory sinus disease. No skull or skullbase lesion.  MRA HEAD FINDINGS  Both internal carotid arteries are widely patent into the brain. The anterior and middle cerebral vessels are patent the without proximal stenosis, aneurysm or vascular malformation. Both vertebral arteries are patent with the right terminating prior to the vertebral artery. There is some artifact in that region but I do not believe there is any actual vascular pathology. The basilar artery appears normal. Posterior circulation branch vessels are patent with the left PCA receiving most of the supply from the anterior circulation.  IMPRESSION: Normal MRI of the brain.  I believe the intracranial MR angiography is normal. See above for discussion of some anatomic variants and technical factors.   Electronically Signed   By: Paulina Fusi M.D.   On: 10/30/2013 10:08   Mr Maxine Glenn Head/brain Wo Cm  10/30/2013   CLINICAL DATA:  Severe headache. Recent the child birth.  EXAM: MRI HEAD WITHOUT CONTRAST  MRA HEAD WITHOUT CONTRAST  TECHNIQUE: Multiplanar, multiecho pulse sequences of the brain and surrounding structures were obtained  without intravenous contrast. Angiographic images of the head were obtained using MRA technique without contrast.  COMPARISON:  Head CT 10/29/2013.  MRI 07/11/2012.  FINDINGS: MRI HEAD FINDINGS  The brain has a normal appearance on all pulse sequences without evidence of malformation, atrophy, old or acute infarction, mass lesion, hemorrhage, hydrocephalus or extra-axial collection. No pituitary mass. No inflammatory sinus disease. No skull or skullbase lesion.  MRA HEAD FINDINGS  Both internal carotid arteries are widely patent into the brain. The anterior and middle cerebral vessels are patent the without proximal stenosis, aneurysm or vascular malformation. Both vertebral arteries are patent with  the right terminating prior to the vertebral artery. There is some artifact in that region but I do not believe there is any actual vascular pathology. The basilar artery appears normal. Posterior circulation branch vessels are patent with the left PCA receiving most of the supply from the anterior circulation.  IMPRESSION: Normal MRI of the brain.  I believe the intracranial MR angiography is normal. See above for discussion of some anatomic variants and technical factors.   Electronically Signed   By: Paulina Fusi M.D.   On: 10/30/2013 10:08    Microbiology: Recent Results (from the past 240 hour(s))  URINE CULTURE     Status: None   Collection Time    10/28/13  6:00 PM      Result Value Range Status   Specimen Description URINE, CLEAN CATCH   Final   Special Requests NONE   Final   Culture  Setup Time     Final   Value: 10/29/2013 00:46     Performed at [facility] Count     Final   Value: >=100,000 COLONIES/ML     Performed at [facility]   Culture     Final   Value: Multiple bacterial morphotypes present, none predominant. Suggest appropriate recollection if clinically indicated.     Performed at [facility]   Report Status 10/29/2013 FINAL   Final  MRSA PCR SCREENING     Status: None   Collection Time    10/29/13  3:15 AM      Result Value Range Status   MRSA by PCR NEGATIVE  NEGATIVE Final   Comment:            The GeneXpert MRSA Assay (FDA     approved for NASAL specimens     only), is one component of a     comprehensive MRSA colonization     surveillance program. It is not     intended to diagnose MRSA     infection nor to guide or     monitor treatment for     MRSA infections.     Labs: Basic Metabolic Panel:  Recent Labs Lab 10/28/13 1850 10/29/13 0930 10/30/13 0415  NA 142  --  143  K 3.4*  --  3.1*  CL 106  --  106  CO2 25  --  26  GLUCOSE 80  --  86  BUN 8  --  7  CREATININE 0.78  --  0.80  CALCIUM 9.6  --  8.9   MG  --  2.6*  --    Liver Function Tests:  Recent Labs Lab 10/28/13 1850 10/30/13 0415 10/30/13 1400  AST 18 16 32  ALT 16 12 13   ALKPHOS 132* 106 103  BILITOT 0.6 0.6 0.7  PROT 7.6 6.9 7.0  ALBUMIN 3.3* 3.0* 3.0*   No  results found for this basename: LIPASE, AMYLASE,  in the last 168 hours No results found for this basename: AMMONIA,  in the last 168 hours CBC:  Recent Labs Lab 10/28/13 1850 10/30/13 0415  WBC 5.5 5.0  NEUTROABS 2.6 2.2  HGB 13.3 13.0  HCT 39.7 39.2  MCV 81.9 84.8  PLT 241 215   Cardiac Enzymes: No results found for this basename: CKTOTAL, CKMB, CKMBINDEX, TROPONINI,  in the last 168 hours BNP: BNP (last 3 results) No results found for this basename: PROBNP,  in the last 8760 hours CBG: No results found for this basename: GLUCAP,  in the last 168 hours     Signed:  REGALADO,BELKYS  [facility] 10/30/2013, 5:18 PM   Transfer care to Melissa [name], please call us with questions. Will sign off.

## 2013-10-30 NOTE — Progress Notes (Signed)
Pt rec'd from unit 3000 at [facility] to AICU #[id number] via [facility] for IV magnesium - [facility] reported VS enroute, but no report rec'd from RN at [facility]. Orders rec'd from Dr Su Hilt prior to pts arrival.

## 2013-10-30 NOTE — Progress Notes (Signed)
Subjective: Headache and spots have resolved, currently   Exam: Filed Vitals:   10/30/13 0612  BP: 164/86  Pulse: 71  Temp:   Resp:    Gen: In bed, NAD MS: Awake, Alert, oriented and appropriate ZO:XWRUE, EOMI Motor: 5/5 throughout Sensory:intact to LT  MRI reviewed- I do not see any signs of the lesion seen on the previous CT suggesting that it is artifactual.    Impression: 27 yo F with migraine headache that has resolved. I suspect that the area seen on CT is an artifact and do note receommend any further testing, though will need to follow up formal MRI read.   Recommendations: 1) F/U formal MRI read.  2) If no stroke seen, no further testing needed, I have canceled her pedning tests 3) If MRI is negative, no further recommendations.   Ritta Slot, MD [facility] [phone]  If 7pm- 7am, please page neurology on call at [phone].

## 2013-10-31 LAB — COMPREHENSIVE METABOLIC PANEL
ALT: 13 U/L (ref 0–35)
Alkaline Phosphatase: 107 U/L (ref 39–117)
BUN: 9 mg/dL (ref 6–23)
CO2: 24 mEq/L (ref 19–32)
Chloride: 104 mEq/L (ref 96–112)
GFR calc Af Amer: >90 mL/min (ref >90)
GFR calc non Af Amer: >90 mL/min (ref >90)
Glucose, Bld: 101 mg/dL — ABNORMAL HIGH (ref 70–99)
Potassium: 3.8 mEq/L (ref 3.5–5.1)
Sodium: 137 mEq/L (ref 135–145)
Total Bilirubin: 0.4 mg/dL (ref 0.3–1.2)
Total Protein: 6.8 g/dL (ref 6.0–8.3)

## 2013-10-31 LAB — CBC
HCT: 37.1 % (ref 36.0–46.0)
Hemoglobin: 12 g/dL (ref 12.0–15.0)
MCHC: 32.3 g/dL (ref 30.0–36.0)
RBC: 4.52 MIL/uL (ref 3.87–5.11)
WBC: 5.9 10*3/uL (ref 4.0–10.5)

## 2013-10-31 LAB — URINE CULTURE: Colony Count: >=100000

## 2013-10-31 LAB — MAGNESIUM: Magnesium: 3.9 mg/dL — ABNORMAL HIGH (ref 1.5–2.5)

## 2013-10-31 MED ORDER — OXYCODONE-ACETAMINOPHEN 5-325 MG PO TABS
1.0000 | ORAL_TABLET | ORAL | Status: DC | PRN
  Administered 2013-10-31: 1 via ORAL
  Filled 2013-10-31: qty 1

## 2013-10-31 NOTE — H&P (Signed)
Melissa [name] is an 27 y.o. female. G2P2, s/p NSVD on 10/19/13.  Tx'd from [facility] per c/w Dr Su Hilt     History of present illness:  Melissa [name] is a 27 y.o. female who is 10 days postpartum who was admitted at [facility] yesterday after patient was found to be having elevated blood pressure with headache. Patient states that she was noticed to have high blood pressure by home health aid 7 days after delivery during routine home visit. Her gynecologist was contacted and patient was prescribed Procardia. Patient was yet to take the medications before which a repeat visit by the home health aide showed that her blood pressure was consistently high and patient was referred to the [facility] and was admitted. Patient has headache globally. Patient was initially admitted and started on IV magnesium and placed on when necessary labetalol. Patient was eventually given sumatriptan empirically for possible migraine as patient has had previous history of migraine as teenager. Despite giving these medications patient still had headache and started having blurred vision. On-call neurologist Dr. Amada Jupiter was consulted. CT head was ordered which showed features concerning for acute stroke. Patient was transferred to [facility] cone for further management.   Currently pt c/o mild HA pain scale 4/10, but much improved from earlier today Denies any CP, SOB, N/V/RUQ pain, no neuro c/o             Past Medical History  Diagnosis Date  . Placenta previa antepartum 2010    Placenta moved 3 days before baby was born  . Abnormal Pap smear 2010    Repeat pap done;Hysteroscopy;Last pap 2012;was normal  . Infection     BV;may get frequently  . Anemia 2010    Iron supplements PP  . Asthma     Has a nebulizer;triggered by stress  . Stroke 2013    MD thought may have had a stroke and heart attack;has neurologist @ [facility]  . Heart attack     MD thinks may have had a heart  attack;Cardiologist @ [facility]  . Nerve pain     Chronic;d/t possible stroke and MI  . Sickle cell trait   . Headache(784.0)   . History of CVA (cerebrovascular accident) 04/09/2013    Pt reports presumptive "stroke" when she was 27y.o.; followed by [facility] neurology for "chronic nerve pain" and has been Rx'd Hydrocodone and Mobic    Past Surgical History  Procedure Laterality Date  . Hysteroscopy  2010    Family History  Problem Relation Age of Onset  . Sickle cell trait Father   . Hypertension Mother   . Cervical cancer Paternal Aunt   . Cervical cancer Paternal Grandmother   . Anemia Mother   . Diabetes Paternal Grandmother   . Diabetes Paternal Grandfather   . Diabetes Paternal Aunt   . Asthma Brother   . Asthma Daughter   . Asthma Mother     Social History:  reports that she has never smoked. She has never used smokeless tobacco. She reports that she drinks alcohol. She reports that she does not use illicit drugs.  Allergies:  Allergies  Allergen Reactions  . Ginger Swelling    Prescriptions prior to admission  Medication Sig Dispense Refill  . albuterol (PROVENTIL HFA;VENTOLIN HFA) 108 (90 BASE) MCG/ACT inhaler Inhale 2 puffs into the lungs every 4 (four) hours as needed for wheezing or shortness of breath.      [name] albuterol (PROVENTIL) (2.5 MG/3ML) 0.083% nebulizer solution  Take 2.5 mg by nebulization every 4 (four) hours as needed for wheezing or shortness of breath.      . ferrous sulfate 325 (65 FE) MG tablet Take 325 mg by mouth daily with breakfast.      . HYDROcodone-acetaminophen (NORCO/VICODIN) 5-325 MG per tablet Take 1 tablet by mouth daily as needed for pain.       . Prenatal Vit-Fe Fumarate-FA (PRENATAL MULTIVITAMIN) TABS Take 1 tablet by mouth daily.       . [DISCONTINUED] ibuprofen (ADVIL,MOTRIN) 200 MG tablet Take 800 mg by mouth daily as needed for pain.      [name] NIFEdipine (PROCARDIA XL) 30 MG 24 hr tablet Take 1 tablet (30 mg total) by mouth daily.  30  tablet  0    Review of Systems  Eyes: Negative for blurred vision, double vision and photophobia.  Respiratory: Negative for shortness of breath.   Cardiovascular: Negative for chest pain, palpitations and leg swelling.  Gastrointestinal: Negative for heartburn, nausea, vomiting and abdominal pain.  Genitourinary: Negative for dysuria, urgency and frequency.  Neurological: Positive for headaches. Negative for dizziness, sensory change, speech change, focal weakness, seizures and loss of consciousness.    Blood pressure 137/82, pulse 76, temperature 98.4 F (36.9 C), temperature source Oral, resp. rate 16, height 5\' 5"  (1.651 m), weight 176 lb 4.8 oz (79.969 kg), SpO2 95.00%. Physical Exam  Nursing note and vitals reviewed. Constitutional: She is oriented to person, place, and time. She appears well-developed and well-nourished.  HENT:  Head: Normocephalic.  Eyes: Pupils are equal, round, and reactive to light.  Neck: Normal range of motion.  Cardiovascular: Normal rate, regular rhythm and normal heart sounds.   Respiratory: Effort normal and breath sounds normal.  GI: Soft. Bowel sounds are normal. She exhibits no distension. There is no tenderness.  Genitourinary:  Deferred   Musculoskeletal: Normal range of motion. She exhibits no edema.  Neurological: She is alert and oriented to person, place, and time. She has normal reflexes.  Neg clonus   Skin: Skin is warm and dry.  Psychiatric: She has a normal mood and affect. Her behavior is normal.      Assessment/Plan:   PPD #10, s/p NSVD Postpartum hypertension  Now with elevated PCR (normal on 10/29) Postpartum pre-eclampsia  No evidence of UTI, culture neg from 10/29 Hypokalemia  Elevated uric acid Elevated LDH   Admit to AICU per c/w Dr Roberts/Dillard  Magnesium sulfate 4g bolus then 2g/hour x24 hours for seizure prophylaxis  Strict I/O DC procardia  Dc cipro  Kdur BID  Recheck CBC, CMET, LDH in the AM     Roland Prine M 10/31/2013, 5:54 AM

## 2013-10-31 NOTE — Progress Notes (Signed)
Post Partum Day 11:S/P SVB, admitted 10/29/13 for elevated BP, visual changes, and HA. Transferred to [facility] 10/30 for neuro evaluation due to CT findings ? C/w CVA--CVA ruled out by MRI, patient transferred back to [facility] 10/30/13 for treatment of presumptive pre-eclampsia.  Magnesium begun at 7:42pm on 10/30/13.  Subjective: Patient on BR with BRP, moderate left-sided HA at present--now down from 5/10 to 2/10 after Percocet at 9:56am. (Vicodin was not effective). Up to BR without syncope, dizziness, SOB, or chest pain. Denies visual sx or epigastric pain. Feeding:  Pumping--baby at home with family   Objective: Blood pressure 119/84, pulse 86, temperature 98.5 F (36.9 C), temperature source Oral, resp. rate 16, height 5\' 5"  (1.651 m), weight 180 lb (81.647 kg), SpO2 98.00%.  Weights: 176 on 10/31 at 6:25p  182 10/31 at 4:02 182 on 10/30 197 on 10/19/13.  Filed Vitals:   10/31/13 0800 10/31/13 0820 10/31/13 0900 10/31/13 1000  BP: 120/76  119/84 118/75  Pulse: 89  86 93  Temp:  98.5 F (36.9 C)    TempSrc:  Oral    Resp:  16 16 16   Height:      Weight:      SpO2: 97%  98% 98%   BP prior to Magnesium sulfate initiation:  129-170/80-100.  Physical Exam:  General: alert Lochia: appropriate Uterine Fundus: firm Incision: Perineum clear DVT Evaluation: No evidence of DVT seen on physical exam. Negative Homan's sign. DTR 1+ without clonus, trace edema  24 hour I/O = + 1081, with 775 cc output x 24 hours. This shift I/O = + 135, with 450 cc output since 7am.  Results for orders placed during the hospital encounter of 10/29/13 (from the past 24 hour(s))  HEPATIC FUNCTION PANEL     Status: Abnormal   Collection Time    10/30/13  2:00 PM      Result Value Range   Total Protein 7.0  6.0 - 8.3 g/dL   Albumin 3.0 (*) 3.5 - 5.2 g/dL   AST 32  0 - 37 U/L   ALT 13  0 - 35 U/L   Alkaline Phosphatase 103  39 - 117 U/L   Total Bilirubin 0.7  0.3 - 1.2 mg/dL   Bilirubin, Direct <1.6   0.0 - 0.3 mg/dL   Indirect Bilirubin NOT CALCULATED  0.3 - 0.9 mg/dL  LACTATE DEHYDROGENASE     Status: Abnormal   Collection Time    10/30/13  2:00 PM      Result Value Range   LDH 496 (*) 94 - 250 U/L  URIC ACID     Status: Abnormal   Collection Time    10/30/13  2:00 PM      Result Value Range   Uric Acid, Serum 9.6 (*) 2.4 - 7.0 mg/dL  URINALYSIS, ROUTINE W REFLEX MICROSCOPIC     Status: Abnormal   Collection Time    10/30/13  2:19 PM      Result Value Range   Color, Urine RED (*) YELLOW   APPearance CLOUDY (*) CLEAR   Specific Gravity, Urine 1.022  1.005 - 1.030   pH 5.5  5.0 - 8.0   Glucose, UA NEGATIVE  NEGATIVE mg/dL   Hgb urine dipstick LARGE (*) NEGATIVE   Bilirubin Urine NEGATIVE  NEGATIVE   Ketones, ur NEGATIVE  NEGATIVE mg/dL   Protein, ur 109 (*) NEGATIVE mg/dL   Urobilinogen, UA 0.2  0.0 - 1.0 mg/dL   Nitrite NEGATIVE  NEGATIVE   Leukocytes,  UA MODERATE (*) NEGATIVE  PROTEIN / CREATININE RATIO, URINE     Status: Abnormal   Collection Time    10/30/13  2:19 PM      Result Value Range   Creatinine, Urine 136.22     Total Protein, Urine 86.5     PROTEIN CREATININE RATIO 0.64 (*) 0.00 - 0.15  URINE MICROSCOPIC-ADD ON     Status: Abnormal   Collection Time    10/30/13  2:19 PM      Result Value Range   Squamous Epithelial / LPF FEW (*) RARE   WBC, UA TOO NUMEROUS TO COUNT  <3 WBC/hpf   RBC / HPF TOO NUMEROUS TO COUNT  <3 RBC/hpf   Bacteria, UA FEW (*) RARE  MRSA PCR SCREENING     Status: None   Collection Time    10/30/13  6:50 PM      Result Value Range   MRSA by PCR NEGATIVE  NEGATIVE  CBC     Status: Abnormal   Collection Time    10/31/13  5:05 AM      Result Value Range   WBC 5.9  4.0 - 10.5 K/uL   RBC 4.52  3.87 - 5.11 MIL/uL   Hemoglobin 12.0  12.0 - 15.0 g/dL   HCT 16.1  09.6 - 04.5 %   MCV 82.1  78.0 - 100.0 fL   MCH 26.5  26.0 - 34.0 pg   MCHC 32.3  30.0 - 36.0 g/dL   RDW 40.9 (*) 81.1 - 91.4 %   Platelets 196  150 - 400 K/uL   COMPREHENSIVE METABOLIC PANEL     Status: Abnormal   Collection Time    10/31/13  5:05 AM      Result Value Range   Sodium 137  135 - 145 mEq/L   Potassium 3.8  3.5 - 5.1 mEq/L   Chloride 104  96 - 112 mEq/L   CO2 24  19 - 32 mEq/L   Glucose, Bld 101 (*) 70 - 99 mg/dL   BUN 9  6 - 23 mg/dL   Creatinine, Ser 7.82  0.50 - 1.10 mg/dL   Calcium 8.7  8.4 - 95.6 mg/dL   Total Protein 6.8  6.0 - 8.3 g/dL   Albumin 2.9 (*) 3.5 - 5.2 g/dL   AST 26  0 - 37 U/L   ALT 13  0 - 35 U/L   Alkaline Phosphatase 107  39 - 117 U/L   Total Bilirubin 0.4  0.3 - 1.2 mg/dL   GFR calc non Af Amer >90  >90 mL/min   GFR calc Af Amer >90  >90 mL/min  MAGNESIUM     Status: Abnormal   Collection Time    10/31/13  5:05 AM      Result Value Range   Magnesium 3.9 (*) 1.5 - 2.5 mg/dL  LACTATE DEHYDROGENASE     Status: Abnormal   Collection Time    10/31/13  5:05 AM      Result Value Range   LDH 310 (*) 94 - 250 U/L      Recent Labs  10/30/13 0415 10/31/13 0505  HGB 13.0 12.0  HCT 39.2 37.1    Assessment/Plan: S/P Vaginal delivery day 11 Postpartum pre-eclampsia Migraine HAs  Plan: Continue current care--Magnesium x 24 hours (7:42pm tonight) Continue meds for HA prn. Reviewed plan of care with patient and mother--questions reviewed, support offered for postpartum hospitalization    LOS: 2 days   Arcelia Pals  10/31/2013, 10:52 AM

## 2013-10-31 NOTE — Progress Notes (Signed)
10/31/13 1236  Vitals  BP 134/90 mmHg  MAP (mmHg) 101  Pulse Rate 88  Oxygen Therapy  SpO2 98 %  pt reported dizziness after returning from the bathroom.

## 2013-11-01 MED ORDER — LABETALOL HCL 100 MG PO TABS: 100.0000 mg | ORAL_TABLET | Freq: Two times a day (BID) | ORAL | Status: DC

## 2013-11-01 MED ORDER — ALBUTEROL SULFATE HFA 108 (90 BASE) MCG/ACT IN AERS: 2.0000 | INHALATION_SPRAY | RESPIRATORY_TRACT | Status: DC | PRN

## 2013-11-01 NOTE — Discharge Summary (Signed)
Obstetric Discharge Summary Reason for Admission: PP preeclampsia Prenatal Procedures: NST Intrapartum Procedures: spontaneous vaginal delivery Postpartum Procedures: MRI, magnesium Complications-Operative and Postpartum: P.P. eclampsia Hemoglobin  Date Value Range Status  10/31/2013 12.0  12.0 - 15.0 g/dL Final     HCT  Date Value Range Status  10/31/2013 37.1  36.0 - 46.0 % Final    Physical Exam:  General: alert and cooperative Lochia: appropriate Uterine Fundus: firm Incision: na DVT Evaluation: No evidence of DVT seen on physical exam. Physical Examination: Chest - clear to auscultation, no wheezes, rales or rhonchi, symmetric air entry Heart - normal rate and regular rhythm Abdomen - soft, nontender, nondistended, no masses or organomegaly  Pt admitted for PP Preeclmapsia and thought to have a CVA.  Neurology was consulted a MRI ruled out a CVA.  She was placed on magnesium sulfate.  Blood pressure improved.  Will follow up in 2 weeks.    Discharge Diagnoses: Term Pregnancy-delivered and Preelampsia  Discharge Information: Date: 11/01/2013 Activity: pelvic rest Diet: routine Medications: PNV, Ibuprofen and Percocet Condition: stable Instructions: refer to practice specific booklet Discharge to: home Follow-up Information   Follow up with Gwynneth Aliment, MD.   Specialty:  Internal Medicine   Contact information:   [address] [place] [address] [phone]       Follow up with Janine Limbo, MD.   Specialty:  Obstetrics and Gynecology   Contact information:   [address] [place] [address] [phone]       Follow up with [facility]. Schedule an appointment as soon as possible for a visit in 2 weeks.   Specialty:  Obstetrics and Gynecology   Contact information:   [address] [place] [address] [phone]      Newborn Data: <<This visit is not  associated with a pregnancy. Delivery information will not be displayed.>> Home with baby was never readmitted.  Kalianna Verbeke A 11/01/2013, 12:16 PM

## 2013-11-01 NOTE — Progress Notes (Signed)
Pt discharged home with family and friends, accompanied by Charity fundraiser. VSS and pt without complaints upon leaving. Voicemail left for T. Tollison, Tree surgeon for [employer] start, in regards to F/U BP check within the next 2 days.

## 2013-11-02 LAB — BETA-2-GLYCOPROTEIN I ABS, IGG/M/A
Beta-2-Glycoprotein I IgA: 4 A Units (ref <20)
Beta-2-Glycoprotein I IgM: 0 M Units (ref <20)

## 2013-11-02 LAB — PROTHROMBIN GENE MUTATION

## 2013-12-31 NOTE — L&D Delivery Note (Signed)
Delivery Note At 6:17 PM a viable female, "Melissa [name]", was delivered via Vaginal, Spontaneous Delivery (Presentation: Left Occiput Anterior).  APGAR: 8, 9; weight  .   Placenta status: Intact, Spontaneous.  Cord: 3 vessels with the following complications: CAN x 1, reduced over shouders at delivery.  Cord pH: NA Tight shoulders noted, resolved with continued McRoberts and suprapubic pressure.  Anesthesia: Epidural  Episiotomy: None Lacerations:  Small skid marks on inner labia--no repair required Suture Repair: None Est. Blood Loss (mL):  200 cc  Mom to postpartum.  Baby to Couplet care / Skin to Skin. Anesthesia requested CBC prior to epidural removal.  Berton Butrick 11/29/2014, 6:38 PM

## 2014-10-07 ENCOUNTER — Inpatient Hospital Stay (HOSPITAL_COMMUNITY)
Admission: AD | Admit: 2014-10-07 | Discharge: 2014-10-07 | Disposition: A | Discharge status: Home / Self Care | Payer: Medicaid Other | Source: Ambulatory Visit | Attending: Obstetrics and Gynecology | Admitting: Obstetrics and Gynecology

## 2014-10-07 ENCOUNTER — Encounter (HOSPITAL_COMMUNITY): Payer: Self-pay

## 2014-10-07 DIAGNOSIS — R109 Unspecified abdominal pain: Secondary | ICD-10-CM | POA: Diagnosis present

## 2014-10-07 DIAGNOSIS — Z3A31 31 weeks gestation of pregnancy: Secondary | ICD-10-CM | POA: Insufficient documentation

## 2014-10-07 LAB — OB RESULTS CONSOLE GBS: STREP GROUP B AG: POSITIVE

## 2014-10-07 MED ORDER — LACTATED RINGERS IV SOLN
INTRAVENOUS | Status: DC
  Administered 2014-10-07: 19:00:00 via INTRAVENOUS

## 2014-10-07 MED ORDER — NIFEDIPINE 10 MG PO CAPS: 10.0000 mg | ORAL_CAPSULE | ORAL | Status: DC | PRN

## 2014-10-07 MED ORDER — NIFEDIPINE 10 MG PO CAPS
10.0000 mg | ORAL_CAPSULE | ORAL | Status: AC
  Administered 2014-10-07 (×3): 10 mg via ORAL
  Filled 2014-10-07 (×3): qty 1

## 2014-10-07 NOTE — Discharge Instructions (Signed)
Braxton Hicks Contractions °Contractions of the uterus can occur throughout pregnancy. Contractions are not always a sign that you are in labor.  °WHAT ARE BRAXTON HICKS CONTRACTIONS?  °Contractions that occur before labor are called Braxton Hicks contractions, or false labor. Toward the end of pregnancy (32-34 weeks), these contractions can develop more often and may become more forceful. This is not true labor because these contractions do not result in opening (dilatation) and thinning of the cervix. They are sometimes difficult to tell apart from true labor because these contractions can be forceful and people have different pain tolerances. You should not feel embarrassed if you go to the hospital with false labor. Sometimes, the only way to tell if you are in true labor is for your health care provider to look for changes in the cervix. °If there are no prenatal problems or other health problems associated with the pregnancy, it is completely safe to be sent home with false labor and await the onset of true labor. °HOW CAN YOU TELL THE DIFFERENCE BETWEEN TRUE AND FALSE LABOR? °False Labor °· The contractions of false labor are usually shorter and not as hard as those of true labor.   °· The contractions are usually irregular.   °· The contractions are often felt in the front of the lower abdomen and in the groin.   °· The contractions may go away when you walk around or change positions while lying down.   °· The contractions get weaker and are shorter lasting as time goes on.   °· The contractions do not usually become progressively stronger, regular, and closer together as with true labor.   °True Labor °· Contractions in true labor last 30-70 seconds, become very regular, usually become more intense, and increase in frequency.   °· The contractions do not go away with walking.   °· The discomfort is usually felt in the top of the uterus and spreads to the lower abdomen and low back.   °· True labor can be  determined by your health care provider with an exam. This will show that the cervix is dilating and getting thinner.   °WHAT TO REMEMBER °· Keep up with your usual exercises and follow other instructions given by your health care provider.   °· Take medicines as directed by your health care provider.   °· Keep your regular prenatal appointments.   °· Eat and drink lightly if you think you are going into labor.   °· If Braxton Hicks contractions are making you uncomfortable:   °¨ Change your position from lying down or resting to walking, or from walking to resting.   °¨ Sit and rest in a tub of warm water.   °¨ Drink 2-3 glasses of water. Dehydration may cause these contractions.   °¨ Do slow and deep breathing several times an hour.   °WHEN SHOULD I SEEK IMMEDIATE MEDICAL CARE? °Seek immediate medical care if: °· Your contractions become stronger, more regular, and closer together.   °· You have fluid leaking or gushing from your vagina.   °· You have a fever.   °· You pass blood-tinged mucus.   °· You have vaginal bleeding.   °· You have continuous abdominal pain.   °· You have low back pain that you never had before.   °· You feel your baby's head pushing down and causing pelvic pressure.   °· Your baby is not moving as much as it used to.   °Document Released: 12/17/2005 Document Revised: 12/22/2013 Document Reviewed: 09/28/2013 °ExitCare® Patient Information ©2015 [employer]. This information is not intended to replace advice given to you by your health care   provider. Make sure you discuss any questions you have with your health care provider. ° °

## 2014-10-07 NOTE — MAU Provider Note (Signed)
Melissa [name] is a 28 y.o. G3P2002 at 31.2 weeks presents to MAU from the office with ctx and lower abd pressure   History     Patient Active Problem List   Diagnosis Date Noted  . Elevated BP 10/29/2013  . Acute headache 10/29/2013  . CVA (cerebral infarction) 10/29/2013  . Elevated blood pressure 10/29/2013  . History of CVA (cerebrovascular accident) 04/09/2013  . Chlamydia infection, current pregnancy 02/18/2013  . Hx Endometrial polyp 02/16/2013  . Sickle cell trait 02/16/2013  . Asthma 02/16/2013  . Migraines 02/16/2013    Chief Complaint  Patient presents with  . Non-stress Test   HPI  OB History   Grav Para Term Preterm Abortions TAB SAB Ect Mult Living   3 2 2       2       Past Medical History  Diagnosis Date  . Placenta previa antepartum 2010    Placenta moved 3 days before baby was born  . Abnormal Pap smear 2010    Repeat pap done;Hysteroscopy;Last pap 2012;was normal  . Infection     BV;may get frequently  . Anemia 2010    Iron supplements PP  . Asthma     Has a nebulizer;triggered by stress  . Stroke 2013    MD thought may have had a stroke and heart attack;has neurologist @ [facility]  . Heart attack     MD thinks may have had a heart attack;Cardiologist @ [facility]  . Nerve pain     Chronic;d/t possible stroke and MI  . Sickle cell trait   . Headache(784.0)   . History of CVA (cerebrovascular accident) 04/09/2013    Pt reports presumptive "stroke" when she was 28y.o.; [facility] "chronic nerve pain" and has been Rx'd Hydrocodone and Mobic    Past Surgical History  Procedure Laterality Date  . Hysteroscopy  2010    Family History  Problem Relation Age of Onset  . Sickle cell trait Father   . Hypertension Mother   . Cervical cancer Paternal Aunt   . Cervical cancer Paternal Grandmother   . Anemia Mother   . Diabetes Paternal Grandmother   . Diabetes Paternal Grandfather   . Diabetes Paternal Aunt   .  Asthma Brother   . Asthma Daughter   . Asthma Mother     History  Substance Use Topics  . Smoking status: Never Smoker   . Smokeless tobacco: Never Used  . Alcohol Use: Yes     Comment: socially;prior to pregnancy    Allergies:  Allergies  Allergen Reactions  . Ginger Swelling    Prescriptions prior to admission  Medication Sig Dispense Refill  . albuterol (PROVENTIL HFA;VENTOLIN HFA) 108 (90 BASE) MCG/ACT inhaler Inhale 2 puffs into the lungs every 4 (four) hours as needed for wheezing or shortness of breath.      [name]. albuterol (PROVENTIL HFA;VENTOLIN HFA) 108 (90 BASE) MCG/ACT inhaler Inhale 2 puffs into the lungs every 4 (four) hours as needed for wheezing or shortness of breath.  1 Inhaler  1  . albuterol (PROVENTIL) (2.5 MG/3ML) 0.083% nebulizer solution Take 2.5 mg by nebulization every 4 (four) hours as needed for wheezing or shortness of breath.      . ferrous sulfate 325 (65 FE) MG tablet Take 325 mg by mouth daily with breakfast.      . HYDROcodone-acetaminophen (NORCO/VICODIN) 5-325 MG per tablet Take 1 tablet by mouth daily as needed for pain.       .[name]  labetalol (NORMODYNE) 100 MG tablet Take 1 tablet (100 mg total) by mouth 2 (two) times daily.  60 tablet  1  . Prenatal Vit-Fe Fumarate-FA (PRENATAL MULTIVITAMIN) TABS Take 1 tablet by mouth daily.         ROS See HPI above, all other systems are negative  Physical Exam   Blood pressure 118/68, pulse 95, temperature 98.6 F (37 C), temperature source Oral, resp. rate 16, height 5\' 5"  (1.651 m), weight 83.371 kg (183 lb 12.8 oz), SpO2 100.00%.  Physical Exam Ext:  WNL ABD: Soft, non tender to palpation, no rebound or guarding SVE: 1/50/-3 in the office this afternoon   ED Course  Assessment: IUP at  31.2 weeks Membranes: intact FHR: reassuring CTX: occassionally   Plan: Extended monitoring IV fluids Sign off to Caryl Never, CNM   Darrio Bade, CNM, MSN 10/07/2014. 6:39 PM

## 2014-10-07 NOTE — MAU Note (Signed)
Patient states she was in the office today for a regular appointment and was 1cm and sent to MAU for monitoring. Has had last prenatal care. Has had some cramping and spotting off and on. Reports good fetal movement.

## 2014-10-08 NOTE — MAU Provider Note (Signed)
Received report and assumed care of this 28 yo G3P2002 @ 31.3 wks who was sent from the office due to ctxs. IVFs in progress upon my arrival to exam room. Ctxs still present and felt by pt, therefore Procardia regimen started. Plan was to monitor pt until ctxs subsided, but after last Procardia dose, pt informed nurse she had to leave and no longer felt ctxs. Cvx per my exam: int os closed/thick/high. On toco, ctxs spaced out, but still present. Reviewed my plan for prolonged monitoring, but pt insisted she had to leave. Strict PTL precautions reviewed. D/C'd home w/ a few Procardia tabs. To have f/u in office early next week.   Discussed above w/ Dr. Charlotta Newton who concurred w/ office visit early next week, Procardia tabs and strict PTL precautions.   Sherre Scarlet, CNM 10/07/14 @ 9:35 PM

## 2014-10-11 ENCOUNTER — Inpatient Hospital Stay (HOSPITAL_COMMUNITY)
Admission: AD | Admit: 2014-10-11 | Discharge: 2014-10-12 | Disposition: A | Discharge status: Home / Self Care | Payer: Medicaid Other | Source: Ambulatory Visit | Attending: Obstetrics and Gynecology | Admitting: Obstetrics and Gynecology

## 2014-10-11 ENCOUNTER — Encounter (HOSPITAL_COMMUNITY): Payer: Self-pay | Admitting: *Deleted

## 2014-10-11 ENCOUNTER — Other Ambulatory Visit: Payer: Self-pay | Admitting: Obstetrics and Gynecology

## 2014-10-11 DIAGNOSIS — R8789 Other abnormal findings in specimens from female genital organs: Secondary | ICD-10-CM

## 2014-10-11 DIAGNOSIS — O09899 Supervision of other high risk pregnancies, unspecified trimester: Secondary | ICD-10-CM

## 2014-10-11 DIAGNOSIS — Z3A32 32 weeks gestation of pregnancy: Secondary | ICD-10-CM | POA: Diagnosis not present

## 2014-10-11 LAB — URINALYSIS, ROUTINE W REFLEX MICROSCOPIC
Bilirubin Urine: NEGATIVE
Glucose, UA: NEGATIVE mg/dL
Hgb urine dipstick: NEGATIVE
Ketones, ur: NEGATIVE mg/dL
Nitrite: NEGATIVE
Protein, ur: NEGATIVE mg/dL
Specific Gravity, Urine: 1.025 (ref 1.005–1.030)
Urobilinogen, UA: 0.2 mg/dL (ref 0.0–1.0)
pH: 6 (ref 5.0–8.0)

## 2014-10-11 LAB — URINE MICROSCOPIC-ADD ON

## 2014-10-11 MED ORDER — BETAMETHASONE SOD PHOS & ACET 6 (3-3) MG/ML IJ SUSP: 12.0000 mg | Freq: Once | INTRAMUSCULAR | Status: DC

## 2014-10-11 MED ORDER — BETAMETHASONE SOD PHOS & ACET 6 (3-3) MG/ML IJ SUSP
12.0000 mg | Freq: Once | INTRAMUSCULAR | Status: AC
  Administered 2014-10-11: 12 mg via INTRAMUSCULAR
  Filled 2014-10-11: qty 2

## 2014-10-11 NOTE — MAU Note (Signed)
Seen at MD's office today and her FFN was positive, was told to come over here when she had time. States she was seen last week and was told she was 1 cm and today was a follow up from that visit. Has been on procardia.

## 2014-10-12 ENCOUNTER — Inpatient Hospital Stay (HOSPITAL_COMMUNITY)
Admission: AD | Admit: 2014-10-12 | Discharge: 2014-10-12 | Disposition: A | Discharge status: Home / Self Care | Payer: Medicaid Other | Source: Ambulatory Visit | Attending: Obstetrics & Gynecology | Admitting: Obstetrics & Gynecology

## 2014-10-12 ENCOUNTER — Encounter (HOSPITAL_COMMUNITY): Payer: Self-pay

## 2014-10-12 DIAGNOSIS — R8789 Other abnormal findings in specimens from female genital organs: Secondary | ICD-10-CM

## 2014-10-12 DIAGNOSIS — O09899 Supervision of other high risk pregnancies, unspecified trimester: Principal | ICD-10-CM

## 2014-10-12 LAB — URINALYSIS, ROUTINE W REFLEX MICROSCOPIC
Bilirubin Urine: NEGATIVE
GLUCOSE, UA: NEGATIVE mg/dL
Hgb urine dipstick: NEGATIVE
KETONES UR: 40 mg/dL — AB
Nitrite: NEGATIVE
PROTEIN: NEGATIVE mg/dL
Specific Gravity, Urine: 1.025 (ref 1.005–1.030)
Urobilinogen, UA: 0.2 mg/dL (ref 0.0–1.0)
pH: 6 (ref 5.0–8.0)

## 2014-10-12 LAB — URINE MICROSCOPIC-ADD ON

## 2014-10-12 MED ORDER — CYCLOBENZAPRINE HCL 5 MG PO TABS: 5.0000 mg | ORAL_TABLET | Freq: Three times a day (TID) | ORAL | Status: DC | PRN

## 2014-10-12 MED ORDER — BETAMETHASONE SOD PHOS & ACET 6 (3-3) MG/ML IJ SUSP
12.0000 mg | Freq: Once | INTRAMUSCULAR | Status: AC
  Administered 2014-10-12: 12 mg via INTRAMUSCULAR
  Filled 2014-10-12: qty 2

## 2014-10-12 MED ORDER — ACETAMINOPHEN 500 MG PO TABS
1000.0000 mg | ORAL_TABLET | Freq: Once | ORAL | Status: AC
  Administered 2014-10-12: 1000 mg via ORAL
  Filled 2014-10-12: qty 2

## 2014-10-12 NOTE — MAU Note (Signed)
Pt reports she has had worsening contractions this afternoon after being seen here last pm.

## 2014-10-12 NOTE — MAU Provider Note (Signed)
History   Patient is 27y.o. [id number] at 32wks who presents for BMZ s/p +fFN.  Patient also reporting contractions in lower abdomen and back.  Patient reports contractions have been ongoing for "a few weeks now, but I thought it was gas."  Patient reports the last two days is when she started experiencing pain and cramping in her lower stomach and back. Patient states pain is constant and states no relieving factors. Patient also has not attempted any OTC medications. Patient denies discharge, issues with urination, or GI concerns.  Patient also reports that she has a headache, but has not eaten since 1130 this morning.  Patient reports some fetal movement, but "not a lot," while denying LOF and VB.  Patient admits that she has been bending over to pick up one year old son.   Patient Active Problem List   Diagnosis Date Noted  . Positive fetal fibronectin at 22 weeks to [redacted] weeks gestation 10/11/2014  . Elevated BP 10/29/2013  . Acute headache 10/29/2013  . CVA (cerebral infarction) 10/29/2013  . Elevated blood pressure 10/29/2013  . History of CVA (cerebrovascular accident) 04/09/2013  . Chlamydia infection, current pregnancy 02/18/2013  . Hx Endometrial polyp 02/16/2013  . Sickle cell trait 02/16/2013  . Asthma 02/16/2013  . Migraines 02/16/2013    Chief Complaint  Patient presents with  . Contractions   HPI  OB History   Grav Para Term Preterm Abortions TAB SAB Ect Mult Living   3 2 2       2       Past Medical History  Diagnosis Date  . Placenta previa antepartum 2010    Placenta moved 3 days before baby was born  . Abnormal Pap smear 2010    Repeat pap done;Hysteroscopy;Last pap 2012;was normal  . Infection     BV;may get frequently  . Anemia 2010    Iron supplements PP  . Asthma     Has a nebulizer;triggered by stress  . Stroke 2013    MD thought may have had a stroke and heart attack;has neurologist @ [facility]  . Heart attack     MD thinks may have had a  heart attack;Cardiologist @ [facility]  . Nerve pain     Chronic;d/t possible stroke and MI  . Sickle cell trait   . Headache(784.0)   . History of CVA (cerebrovascular accident) 04/09/2013    Pt reports presumptive "stroke" when she was 28y.o.; [facility] "[facility]" and has been Rx'd Hydrocodone and Mobic    Past Surgical History  Procedure Laterality Date  . Hysteroscopy  2010    Family History  Problem Relation Age of Onset  . Sickle cell trait Father   . Hypertension Mother   . Cervical cancer Paternal Aunt   . Cervical cancer Paternal Grandmother   . Anemia Mother   . Diabetes Paternal Grandmother   . Diabetes Paternal Grandfather   . Diabetes Paternal Aunt   . Asthma Brother   . Asthma Daughter   . Asthma Mother     History  Substance Use Topics  . Smoking status: Never Smoker   . Smokeless tobacco: Never Used  . Alcohol Use: Yes     Comment: socially;prior to pregnancy    Allergies:  Allergies  Allergen Reactions  . Ginger Anaphylaxis    Prescriptions prior to admission  Medication Sig Dispense Refill  . acetaminophen (TYLENOL) 325 MG tablet Take 650 mg by mouth every 6 (six) hours as  needed for mild pain or headache.       [name] NIFEdipine (PROCARDIA) 10 MG capsule Take 1 capsule (10 mg total) by mouth every 4 (four) hours as needed.  12 capsule  0  . Prenatal Vit-Fe Fumarate-FA (PRENATAL MULTIVITAMIN) TABS tablet Take 1 tablet by mouth daily.       [name] albuterol (PROVENTIL HFA;VENTOLIN HFA) 108 (90 BASE) MCG/ACT inhaler Inhale 2 puffs into the lungs every 4 (four) hours as needed for wheezing or shortness of breath.      [name] albuterol (PROVENTIL) (2.5 MG/3ML) 0.083% nebulizer solution Take 2.5 mg by nebulization every 4 (four) hours as needed for wheezing or shortness of breath.        ROS  See HPI Above Physical Exam   Blood pressure 112/72, pulse 84, temperature 98.4 F (36.9 C), temperature source Oral, resp. rate 18, height 5\' 5"   (1.651 m), weight 185 lb (83.915 kg), SpO2 99.00%.  Results for orders placed during the hospital encounter of 10/12/14 (from the past 24 hour(s))  URINALYSIS, ROUTINE W REFLEX MICROSCOPIC     Status: Abnormal   Collection Time    10/12/14  9:23 PM      Result Value Ref Range   Color, Urine YELLOW  YELLOW   APPearance CLEAR  CLEAR   Specific Gravity, Urine 1.025  1.005 - 1.030   pH 6.0  5.0 - 8.0   Glucose, UA NEGATIVE  NEGATIVE mg/dL   Hgb urine dipstick NEGATIVE  NEGATIVE   Bilirubin Urine NEGATIVE  NEGATIVE   Ketones, ur 40 (*) NEGATIVE mg/dL   Protein, ur NEGATIVE  NEGATIVE mg/dL   Urobilinogen, UA 0.2  0.0 - 1.0 mg/dL   Nitrite NEGATIVE  NEGATIVE   Leukocytes, UA SMALL (*) NEGATIVE  URINE MICROSCOPIC-ADD ON     Status: Abnormal   Collection Time    10/12/14  9:23 PM      Result Value Ref Range   Squamous Epithelial / LPF FEW (*) RARE   WBC, UA 3-6  <3 WBC/hpf   RBC / HPF 0-2  <3 RBC/hpf   Bacteria, UA RARE  RARE    Physical Exam  Constitutional: Vital signs are normal. She appears well-developed and well-nourished. She is cooperative. No distress.  Cardiovascular: Normal rate, regular rhythm and normal heart sounds.   Respiratory: Effort normal and breath sounds normal.  GI: Soft. Bowel sounds are normal. There is tenderness in the right lower quadrant and left lower quadrant. There is no CVA tenderness.  Genitourinary:  Deferred  Musculoskeletal:       Lumbar back: She exhibits tenderness and pain.  Neurological: She is alert.  Skin: Skin is warm and dry.  Psychiatric: She has a normal mood and affect. Her behavior is normal. Judgment and thought content normal.   FHR: 135 bpm, Mod Var, -Decels, +Accels UC: Irritability noted, none palpated  ED Course  Assessment: IUP at 32wks Cat I FT +fFN Back & Abdominal Pain  Plan: -BMZ dose given -Tylenol given -Discussed proper body mechanics to reduce back pain -Flexeril 5mg  to pharmacy for prn usage  Follow Up  (2110) -Patient's mother at front desk reporting that patient needs psych evaluation.  In room to talk to patient who discloses multiple psychosocial issues within home environment including recent break up with FOB, living with mother, and full-time Chemical engineer. Discussed and recommended MH counseling referral, which patient agrees to.  Will send referral via [facility]. -Preterm labor and bleeding precautions -Discharge to home  -  Keep appt as scheduled  -Encouraged to call if any questions or concerns arise prior to next scheduled office visit.   Hser Belanger LYNN CNM, MSN 10/12/2014 8:24 PM

## 2014-10-12 NOTE — MAU Provider Note (Signed)
Pt arrived to MAU on 10/11/14 @ 11 pm for initial BMZ injection due to positive fFN in office today.   Provider was headed to assist w/ c-section, so requested NST and cervical exam if + ctxs or c/o ctxs. Per RN, 3 ctxs noted, however unfelt by pt. Cvx checked and noted to be unchanged. Pt denied change in vaginal d/c, VB or LOF. +FM.  Pt d/c'd home w/ strict PTL precautions after obtaining Cat 1 tracing. She is to return tomorrow night for 2nd injection.   Sherre Scarlet 10/12/14 @ 3:05 AM

## 2014-10-12 NOTE — Discharge Instructions (Signed)
Back Pain in Pregnancy °Back pain during pregnancy is common. It happens in about half of all pregnancies. It is important for you and your baby that you remain active during your pregnancy. If you feel that back pain is not allowing you to remain active or sleep well, it is time to see your caregiver. Back pain may be caused by several factors related to changes during your pregnancy. Fortunately, unless you had trouble with your back before your pregnancy, the pain is likely to get better after you deliver. °Low back pain usually occurs between the fifth and seventh months of pregnancy. It can, however, happen in the first couple months. Factors that increase the risk of back problems include:  °· Previous back problems. °· Injury to your back. °· Having twins or multiple births. °· A chronic cough. °· Stress. °· Job-related repetitive motions. °· Muscle or spinal disease in the back. °· Family history of back problems, ruptured (herniated) discs, or osteoporosis. °· Depression, anxiety, and panic attacks. °CAUSES  °· When you are pregnant, your body produces a hormone called relaxin. This hormone makes the ligaments connecting the low back and pubic bones more flexible. This flexibility allows the baby to be delivered more easily. When your ligaments are loose, your muscles need to work harder to support your back. Soreness in your back can come from tired muscles. Soreness can also come from back tissues that are irritated since they are receiving less support. °· As the baby grows, it puts pressure on the nerves and blood vessels in your pelvis. This can cause back pain. °· As the baby grows and gets heavier during pregnancy, the uterus pushes the stomach muscles forward and changes your center of gravity. This makes your back muscles work harder to maintain good posture. °SYMPTOMS  °Lumbar pain during pregnancy °Lumbar pain during pregnancy usually occurs at or above the waist in the center of the back. There  may be pain and numbness that radiates into your leg or foot. This is similar to low back pain experienced by non-pregnant women. It usually increases with sitting for long periods of time, standing, or repetitive lifting. Tenderness may also be present in the muscles along your upper back. °Posterior pelvic pain during pregnancy °Pain in the back of the pelvis is more common than lumbar pain in pregnancy. It is a deep pain felt in your side at the waistline, or across the tailbone (sacrum), or in both places. You may have pain on one or both sides. This pain can also go into the buttocks and backs of the upper thighs. Pubic and groin pain may also be present. The pain does not quickly resolve with rest, and morning stiffness may also be present. °Pelvic pain during pregnancy can be brought on by most activities. A high level of fitness before and during pregnancy may or may not prevent this problem. Labor pain is usually 1 to 2 minutes apart, lasts for about 1 minute, and involves a bearing down feeling or pressure in your pelvis. However, if you are at term with the pregnancy, constant low back pain can be the beginning of early labor, and you should be aware of this. °DIAGNOSIS  °X-rays of the back should not be done during the first 12 to 14 weeks of the pregnancy and only when absolutely necessary during the rest of the pregnancy. MRIs do not give off radiation and are safe during pregnancy. MRIs also should only be done when absolutely necessary. °HOME CARE INSTRUCTIONS °· Exercise   as directed by your caregiver. Exercise is the most effective way to prevent or manage back pain. If you have a back problem, it is especially important to avoid sports that require sudden body movements. Swimming and walking are great activities. °· Do not stand in one place for long periods of time. °· Do not wear high heels. °· Sit in chairs with good posture. Use a pillow on your lower back if necessary. Make sure your head  rests over your shoulders and is not hanging forward. °· Try sleeping on your side, preferably the left side, with a pillow or two between your legs. If you are sore after a night's rest, your bed may be too soft. Try placing a board between your mattress and box spring. °· Listen to your body when lifting. If you are experiencing pain, ask for help or try bending your knees more so you can use your leg muscles rather than your back muscles. Squat down when picking up something from the floor. Do not bend over. °· Eat a healthy diet. Try to gain weight within your caregiver's recommendations. °· Use heat or cold packs 3 to 4 times a day for 15 minutes to help with the pain. °· Only take over-the-counter or prescription medicines for pain, discomfort, or fever as directed by your caregiver. °Sudden (acute) back pain °· Use bed rest for only the most extreme, acute episodes of back pain. Prolonged bed rest over 48 hours will aggravate your condition. °· Ice is very effective for acute conditions. °¨ Put ice in a plastic bag. °¨ Place a towel between your skin and the bag. °¨ Leave the ice on for 10 to 20 minutes every 2 hours, or as needed. °· Using heat packs for 30 minutes prior to activities is also helpful. °Continued back pain °See your caregiver if you have continued problems. Your caregiver can help or refer you for appropriate physical therapy. With conditioning, most back problems can be avoided. Sometimes, a more serious issue may be the cause of back pain. You should be seen right away if new problems seem to be developing. Your caregiver may recommend: °· A maternity girdle. °· An elastic sling. °· A back brace. °· A massage therapist or acupuncture. °SEEK MEDICAL CARE IF:  °· You are not able to do most of your daily activities, even when taking the pain medicine you were given. °· You need a referral to a physical therapist or chiropractor. °· You want to try acupuncture. °SEEK IMMEDIATE MEDICAL CARE  IF: °· You develop numbness, tingling, weakness, or problems with the use of your arms or legs. °· You develop severe back pain that is no longer relieved with medicines. °· You have a sudden change in bowel or bladder control. °· You have increasing pain in other areas of the body. °· You develop shortness of breath, dizziness, or fainting. °· You develop nausea, vomiting, or sweating. °· You have back pain which is similar to labor pains. °· You have back pain along with your water breaking or vaginal bleeding. °· You have back pain or numbness that travels down your leg. °· Your back pain developed after you fell. °· You develop pain on one side of your back. You may have a kidney stone. °· You see blood in your urine. You may have a bladder infection or kidney stone. °· You have back pain with blisters. You may have shingles. °Back pain is fairly common during pregnancy but should not be accepted as just part of   the process. Back pain should always be treated as soon as possible. This will make your pregnancy as pleasant as possible. Document Released: 03/27/2006 Document Revised: 03/10/2012 Document Reviewed: 05/08/2011 [facility] Patient Information 2015 [place], [place]. This information is not intended to replace advice given to you by your health care provider. Make sure you discuss any questions you have with your health care provider. Preterm Labor Information Preterm labor is when labor starts at less than 37 weeks of pregnancy. The normal length of a pregnancy is 39 to 41 weeks. CAUSES Often, there is no identifiable underlying cause as to why a woman goes into preterm labor. One of the most common known causes of preterm labor is infection. Infections of the uterus, cervix, vagina, amniotic sac, bladder, kidney, or even the lungs (pneumonia) can cause labor to start. Other suspected causes of preterm labor include:   Urogenital infections, such as yeast infections and bacterial vaginosis.    Uterine abnormalities (uterine shape, uterine septum, fibroids, or bleeding from the placenta).   A cervix that has been operated on (it may fail to stay closed).   Malformations in the fetus.   Multiple gestations (twins, triplets, and so on).   Breakage of the amniotic sac.  RISK FACTORS  Having a previous history of preterm labor.   Having premature rupture of membranes (PROM).   Having a placenta that covers the opening of the cervix (placenta previa).   Having a placenta that separates from the uterus (placental abruption).   Having a cervix that is too weak to hold the fetus in the uterus (incompetent cervix).   Having too much fluid in the amniotic sac (polyhydramnios).   Taking illegal drugs or smoking while pregnant.   Not gaining enough weight while pregnant.   Being younger than 71 and older than 27 years old.   Having a low socioeconomic status.   Being African American. SYMPTOMS Signs and symptoms of preterm labor include:   Menstrual-like cramps, abdominal pain, or back pain.  Uterine contractions that are regular, as frequent as six in an hour, regardless of their intensity (may be mild or painful).  Contractions that start on the top of the uterus and spread down to the lower abdomen and back.   A sense of increased pelvic pressure.   A watery or bloody mucus discharge that comes from the vagina.  TREATMENT Depending on the length of the pregnancy and other circumstances, your health care provider may suggest bed rest. If necessary, there are medicines that can be given to stop contractions and to mature the fetal lungs. If labor happens before 34 weeks of pregnancy, a prolonged hospital stay may be recommended. Treatment depends on the condition of both you and the fetus.  WHAT SHOULD YOU DO IF YOU THINK YOU ARE IN PRETERM LABOR? Call your health care provider right away. You will need to go to the hospital to get checked  immediately. HOW CAN YOU PREVENT PRETERM LABOR IN FUTURE PREGNANCIES? You should:   Stop smoking if you smoke.  Maintain healthy weight gain and avoid chemicals and drugs that are not necessary.  Be watchful for any type of infection.  Inform your health care provider if you have a known history of preterm labor. Document Released: 03/08/2004 Document Revised: 08/19/2013 Document Reviewed: 01/19/2013 [facility] Patient Information 2015 [place], [place]. This information is not intended to replace advice given to you by your health care provider. Make sure you discuss any questions you have with your health care  provider.

## 2014-10-14 LAB — URINE CULTURE
Colony Count: NO GROWTH
Culture: NO GROWTH

## 2014-10-18 ENCOUNTER — Inpatient Hospital Stay (HOSPITAL_COMMUNITY)
Admission: AD | Admit: 2014-10-18 | Discharge: 2014-10-18 | Disposition: A | Discharge status: Home / Self Care | Payer: Medicaid Other | Source: Ambulatory Visit | Attending: Obstetrics and Gynecology | Admitting: Obstetrics and Gynecology

## 2014-10-18 ENCOUNTER — Encounter (HOSPITAL_COMMUNITY): Payer: Self-pay

## 2014-10-18 DIAGNOSIS — Z3A32 32 weeks gestation of pregnancy: Secondary | ICD-10-CM | POA: Diagnosis not present

## 2014-10-18 LAB — URINALYSIS, ROUTINE W REFLEX MICROSCOPIC
BILIRUBIN URINE: NEGATIVE
GLUCOSE, UA: NEGATIVE mg/dL
Hgb urine dipstick: NEGATIVE
KETONES UR: NEGATIVE mg/dL
Nitrite: NEGATIVE
PROTEIN: NEGATIVE mg/dL
Specific Gravity, Urine: <1.005 — ABNORMAL LOW (ref 1.005–1.030)
Urobilinogen, UA: 0.2 mg/dL (ref 0.0–1.0)
pH: 6 (ref 5.0–8.0)

## 2014-10-18 LAB — URINE MICROSCOPIC-ADD ON

## 2014-10-18 MED ORDER — NIFEDIPINE 10 MG PO CAPS
10.0000 mg | ORAL_CAPSULE | ORAL | Status: AC
  Administered 2014-10-18: 10 mg via ORAL
  Filled 2014-10-18: qty 1

## 2014-10-18 NOTE — MAU Provider Note (Signed)
  History     CSN: [id number]  Arrival date and time: 10/18/14 9211   None     Chief Complaint  Patient presents with  . Labor Eval   HPI Pt presents c/o ctxs about every 10 minutes.  Denies LOF, VB and reports good fetal movement.  She was admitted about a week ago in PTL (cvx 1cm) with +FFN and s/p course of BMZ.    OB History   Grav Para Term Preterm Abortions TAB SAB Ect Mult Living   3 2 2       2       Past Medical History  Diagnosis Date  . Placenta previa antepartum 2010    Placenta moved 3 days before baby was born  . Abnormal Pap smear 2010    Repeat pap done;Hysteroscopy;Last pap 2012;was normal  . Infection     BV;may get frequently  . Anemia 2010    Iron supplements PP  . Asthma     Has a nebulizer;triggered by stress  . Stroke 2013    MD thought may have had a stroke and heart attack;has neurologist @ [facility]  . Heart attack     MD thinks may have had a heart attack;Cardiologist @ [facility]  . Nerve pain     Chronic;d/t possible stroke and MI  . Sickle cell trait   . Headache(784.0)   . History of CVA (cerebrovascular accident) 04/09/2013    Pt reports presumptive "stroke" when she was 28y.o.; followed by [facility] neurology for "chronic nerve pain" and has been Rx'd Hydrocodone and Mobic    Past Surgical History  Procedure Laterality Date  . Hysteroscopy  2010    Family History  Problem Relation Age of Onset  . Sickle cell trait Father   . Hypertension Mother   . Cervical cancer Paternal Aunt   . Cervical cancer Paternal Grandmother   . Anemia Mother   . Diabetes Paternal Grandmother   . Diabetes Paternal Grandfather   . Diabetes Paternal Aunt   . Asthma Brother   . Asthma Daughter   . Asthma Mother     History  Substance Use Topics  . Smoking status: Never Smoker   . Smokeless tobacco: Never Used  . Alcohol Use: Yes     Comment: socially;prior to pregnancy    Allergies:  Allergies  Allergen Reactions  . Ginger  Anaphylaxis    No prescriptions prior to admission    ROS  Non-contributory  Physical Exam   Blood pressure 121/75, pulse 89, temperature 97.3 F (36.3 C), temperature source Oral, resp. rate 18, height 5' 4.5" (1.638 m), weight 84.823 kg (187 lb).  Physical Exam Lungs CTA CV RRR ABD soft, gravid, NT Ext no calf tenderness cvx 1/50soft/vtx FHT cat 1  MAU Course  Procedures  Observation with serial cervical check Procardia 10mg   Assessment and Plan  P2 at 32 6/7 observed for PTL.  Cervix unchanged and pt saying she no longer feels ctxs.  Fetal status is reassuring.  Arjay Jaskiewicz Y 10/18/2014, 4:45 AM

## 2014-10-18 NOTE — MAU Note (Signed)
Recheck pt when it has been an hour since last exam. If pt is still contracting, give 20 mg of procardia.

## 2014-10-18 NOTE — Discharge Instructions (Signed)
Braxton Hicks Contractions °Contractions of the uterus can occur throughout pregnancy. Contractions are not always a sign that you are in labor.  °WHAT ARE BRAXTON HICKS CONTRACTIONS?  °Contractions that occur before labor are called Braxton Hicks contractions, or false labor. Toward the end of pregnancy (32-34 weeks), these contractions can develop more often and may become more forceful. This is not true labor because these contractions do not result in opening (dilatation) and thinning of the cervix. They are sometimes difficult to tell apart from true labor because these contractions can be forceful and people have different pain tolerances. You should not feel embarrassed if you go to the hospital with false labor. Sometimes, the only way to tell if you are in true labor is for your health care provider to look for changes in the cervix. °If there are no prenatal problems or other health problems associated with the pregnancy, it is completely safe to be sent home with false labor and await the onset of true labor. °HOW CAN YOU TELL THE DIFFERENCE BETWEEN TRUE AND FALSE LABOR? °False Labor °· The contractions of false labor are usually shorter and not as hard as those of true labor.   °· The contractions are usually irregular.   °· The contractions are often felt in the front of the lower abdomen and in the groin.   °· The contractions may go away when you walk around or change positions while lying down.   °· The contractions get weaker and are shorter lasting as time goes on.   °· The contractions do not usually become progressively stronger, regular, and closer together as with true labor.   °True Labor °· Contractions in true labor last 30-70 seconds, become very regular, usually become more intense, and increase in frequency.   °· The contractions do not go away with walking.   °· The discomfort is usually felt in the top of the uterus and spreads to the lower abdomen and low back.   °· True labor can be  determined by your health care provider with an exam. This will show that the cervix is dilating and getting thinner.   °WHAT TO REMEMBER °· Keep up with your usual exercises and follow other instructions given by your health care provider.   °· Take medicines as directed by your health care provider.   °· Keep your regular prenatal appointments.   °· Eat and drink lightly if you think you are going into labor.   °· If Braxton Hicks contractions are making you uncomfortable:   °¨ Change your position from lying down or resting to walking, or from walking to resting.   °¨ Sit and rest in a tub of warm water.   °¨ Drink 2-3 glasses of water. Dehydration may cause these contractions.   °¨ Do slow and deep breathing several times an hour.   °WHEN SHOULD I SEEK IMMEDIATE MEDICAL CARE? °Seek immediate medical care if: °· Your contractions become stronger, more regular, and closer together.   °· You have fluid leaking or gushing from your vagina.   °· You have a fever.   °· You pass blood-tinged mucus.   °· You have vaginal bleeding.   °· You have continuous abdominal pain.   °· You have low back pain that you never had before.   °· You feel your baby's head pushing down and causing pelvic pressure.   °· Your baby is not moving as much as it used to.   °Document Released: 12/17/2005 Document Revised: 12/22/2013 Document Reviewed: 09/28/2013 °Exit[employer]® Patient Information ©2015 [employer]. This information is not intended to replace advice given to you by your health care   provider. Make sure you discuss any questions you have with your health care provider.  Fetal Movement Counts Patient Name: __________________________________________________ Patient Due Date: ____________________ Performing a fetal movement count is highly recommended in high-risk pregnancies, but it is good for every pregnant woman to do. Your health care provider may ask you to start counting fetal movements at 28 weeks of the pregnancy. Fetal  movements often increase:  After eating a full meal.  After physical activity.  After eating or drinking something sweet or cold.  At rest. Pay attention to when you feel the baby is most active. This will help you notice a pattern of your baby's sleep and wake cycles and what factors contribute to an increase in fetal movement. It is important to perform a fetal movement count at the same time each day when your baby is normally most active.  HOW TO COUNT FETAL MOVEMENTS 1. Find a quiet and comfortable area to sit or lie down on your left side. Lying on your left side provides the best blood and oxygen circulation to your baby. 2. Write down the day and time on a sheet of paper or in a journal. 3. Start counting kicks, flutters, swishes, rolls, or jabs in a 2-hour period. You should feel at least 10 movements within 2 hours. 4. If you do not feel 10 movements in 2 hours, wait 2-3 hours and count again. Look for a change in the pattern or not enough counts in 2 hours. SEEK MEDICAL CARE IF:  You feel less than 10 counts in 2 hours, tried twice.  There is no movement in over an hour.  The pattern is changing or taking longer each day to reach 10 counts in 2 hours.  You feel the baby is not moving as he or she usually does. Date: ____________ Movements: ____________ Start time: ____________ Melissa MartinFinish time: ____________  Date: ____________ Movements: ____________ Start time: ____________ Melissa MartinFinish time: ____________ Date: ____________ Movements: ____________ Start time: ____________ Melissa MartinFinish time: ____________ Date: ____________ Movements: ____________ Start time: ____________ Melissa MartinFinish time: ____________ Date: ____________ Movements: ____________ Start time: ____________ Melissa MartinFinish time: ____________ Date: ____________ Movements: ____________ Start time: ____________ Melissa MartinFinish time: ____________ Date: ____________ Movements: ____________ Start time: ____________ Melissa MartinFinish time: ____________ Date: ____________  Movements: ____________ Start time: ____________ Melissa MartinFinish time: ____________  Date: ____________ Movements: ____________ Start time: ____________ Melissa MartinFinish time: ____________ Date: ____________ Movements: ____________ Start time: ____________ Melissa MartinFinish time: ____________ Date: ____________ Movements: ____________ Start time: ____________ Melissa MartinFinish time: ____________ Date: ____________ Movements: ____________ Start time: ____________ Melissa MartinFinish time: ____________ Date: ____________ Movements: ____________ Start time: ____________ Melissa MartinFinish time: ____________ Date: ____________ Movements: ____________ Start time: ____________ Melissa MartinFinish time: ____________ Date: ____________ Movements: ____________ Start time: ____________ Melissa MartinFinish time: ____________  Date: ____________ Movements: ____________ Start time: ____________ Melissa MartinFinish time: ____________ Date: ____________ Movements: ____________ Start time: ____________ Melissa MartinFinish time: ____________ Date: ____________ Movements: ____________ Start time: ____________ Melissa MartinFinish time: ____________ Date: ____________ Movements: ____________ Start time: ____________ Melissa MartinFinish time: ____________ Date: ____________ Movements: ____________ Start time: ____________ Melissa MartinFinish time: ____________ Date: ____________ Movements: ____________ Start time: ____________ Melissa MartinFinish time: ____________ Date: ____________ Movements: ____________ Start time: ____________ Melissa MartinFinish time: ____________  Date: ____________ Movements: ____________ Start time: ____________ Melissa MartinFinish time: ____________ Date: ____________ Movements: ____________ Start time: ____________ Melissa MartinFinish time: ____________ Date: ____________ Movements: ____________ Start time: ____________ Melissa MartinFinish time: ____________ Date: ____________ Movements: ____________ Start time: ____________ Melissa MartinFinish time: ____________ Date: ____________ Movements: ____________ Start time: ____________ Melissa MartinFinish time: ____________ Date: ____________ Movements: ____________ Start time:  ____________ Melissa MartinFinish time: ____________ Date: ____________ Movements:  ____________ Start time: ____________ Melissa [name] time: ____________  Date: ____________ Movements: ____________ Start time: ____________ Melissa [name] time: ____________ Date: ____________ Movements: ____________ Start time: ____________ Melissa [name] time: ____________ Date: ____________ Movements: ____________ Start time: ____________ Melissa [name] time: ____________ Date: ____________ Movements: ____________ Start time: ____________ Melissa [name] time: ____________ Date: ____________ Movements: ____________ Start time: ____________ Melissa [name] time: ____________ Date: ____________ Movements: ____________ Start time: ____________ Melissa [name] time: ____________ Date: ____________ Movements: ____________ Start time: ____________ Melissa [name] time: ____________  Date: ____________ Movements: ____________ Start time: ____________ Melissa [name] time: ____________ Date: ____________ Movements: ____________ Start time: ____________ Melissa [name] time: ____________ Date: ____________ Movements: ____________ Start time: ____________ Melissa [name] time: ____________ Date: ____________ Movements: ____________ Start time: ____________ Melissa [name] time: ____________ Date: ____________ Movements: ____________ Start time: ____________ Melissa [name] time: ____________ Date: ____________ Movements: ____________ Start time: ____________ Melissa [name] time: ____________ Date: ____________ Movements: ____________ Start time: ____________ Melissa [name] time: ____________  Date: ____________ Movements: ____________ Start time: ____________ Melissa [name] time: ____________ Date: ____________ Movements: ____________ Start time: ____________ Melissa [name] time: ____________ Date: ____________ Movements: ____________ Start time: ____________ Melissa [name] time: ____________ Date: ____________ Movements: ____________ Start time: ____________ Melissa [name] time: ____________ Date: ____________ Movements: ____________ Start time: ____________ Melissa [name] time: ____________ Date:  ____________ Movements: ____________ Start time: ____________ Melissa [name] time: ____________ Date: ____________ Movements: ____________ Start time: ____________ Melissa [name] time: ____________  Date: ____________ Movements: ____________ Start time: ____________ Melissa [name] time: ____________ Date: ____________ Movements: ____________ Start time: ____________ Melissa [name] time: ____________ Date: ____________ Movements: ____________ Start time: ____________ Melissa [name] time: ____________ Date: ____________ Movements: ____________ Start time: ____________ Melissa [name] time: ____________ Date: ____________ Movements: ____________ Start time: ____________ Melissa [name] time: ____________ Date: ____________ Movements: ____________ Start time: ____________ Melissa [name] time: ____________ Document Released: 01/16/2007 Document Revised: 05/03/2014 Document Reviewed: 10/13/2012 [employer] Patient Information 2015 [place] [employer]. This information is not intended to replace advice given to you by your health care provider. Make sure you discuss any questions you have with your health care provider.   Hydrate, empty bladder often and continue to take Procardia as prescribed. Call doctor if symptoms worsen or return to MAU as needed for emergencies.

## 2014-10-18 NOTE — MAU Note (Signed)
Pt c/o contractions every 10 mins. Denies LOF or vag bleeding. +FM

## 2014-10-18 NOTE — MAU Note (Signed)
Per Dr Su Hilt after pt receives procardia and is still contracting half an hour later, she may receive 20mg  of procardia

## 2014-11-01 ENCOUNTER — Encounter (HOSPITAL_COMMUNITY): Payer: Self-pay

## 2014-11-24 ENCOUNTER — Encounter (HOSPITAL_COMMUNITY): Payer: Self-pay | Admitting: *Deleted

## 2014-11-24 ENCOUNTER — Telehealth (HOSPITAL_COMMUNITY): Payer: Self-pay | Admitting: *Deleted

## 2014-11-24 NOTE — Telephone Encounter (Signed)
Preadmission screen  

## 2014-11-25 ENCOUNTER — Encounter (HOSPITAL_COMMUNITY): Payer: Self-pay | Admitting: General Practice

## 2014-11-25 ENCOUNTER — Inpatient Hospital Stay (HOSPITAL_COMMUNITY)
Admission: AD | Admit: 2014-11-25 | Discharge: 2014-11-25 | Disposition: A | Discharge status: Home / Self Care | Payer: Medicaid Other | Source: Ambulatory Visit | Attending: Obstetrics and Gynecology | Admitting: Obstetrics and Gynecology

## 2014-11-25 DIAGNOSIS — Z3A38 38 weeks gestation of pregnancy: Secondary | ICD-10-CM | POA: Diagnosis not present

## 2014-11-25 DIAGNOSIS — O26893 Other specified pregnancy related conditions, third trimester: Secondary | ICD-10-CM | POA: Diagnosis not present

## 2014-11-25 DIAGNOSIS — R102 Pelvic and perineal pain: Secondary | ICD-10-CM | POA: Insufficient documentation

## 2014-11-25 DIAGNOSIS — O479 False labor, unspecified: Secondary | ICD-10-CM

## 2014-11-25 NOTE — MAU Provider Note (Signed)
History    Melissa [name] is a 28y.o. [id number] at 38.2wks who presents with complaint of vaginal pressure and cramping.  Patient states vaginal pressure has been present throughout pregnancy, but became more intense this evening.  Patient expresses concern as she reports her membranes were stripped at last office visit and she was 3/50 and noted some bloody show.  Patient also reports cramping started this afternoon and has become more intense as well.  Patient admits that she has not attempted to take anything for cramping.  Patient reports active fetus and denies LOF.  Patient also denies new discharge and declines pelvic exam for wet prep.  Patient Active Problem List   Diagnosis Date Noted  . Positive fetal fibronectin at 22 weeks to [redacted] weeks gestation 10/11/2014  . Elevated BP 10/29/2013  . Acute headache 10/29/2013  . CVA (cerebral infarction) 10/29/2013  . Elevated blood pressure 10/29/2013  . History of CVA (cerebrovascular accident) 04/09/2013  . Chlamydia infection, current pregnancy 02/18/2013  . Hx Endometrial polyp 02/16/2013  . Sickle cell trait 02/16/2013  . Asthma 02/16/2013  . Migraines 02/16/2013    Chief Complaint  Patient presents with  . Abdominal Pain  . Back Pain   HPI  OB History    Gravida Para Term Preterm AB TAB SAB Ectopic Multiple Living   3 2 2       2       Past Medical History  Diagnosis Date  . Placenta previa antepartum 2010    Placenta moved 3 days before baby was born  . Abnormal Pap smear 2010    Repeat pap done;Hysteroscopy;Last pap 2012;was normal  . Infection     BV;may get frequently  . Anemia 2010    Iron supplements PP  . Asthma     Has a nebulizer;triggered by stress  . Stroke 2013    MD thought may have had a stroke and heart attack;has neurologist @ [facility]  . Heart attack     MD thinks may have had a heart attack;Cardiologist @ [facility]  . Nerve pain     Chronic;d/t possible stroke and MI  . Sickle cell trait   .  Headache(784.0)   . History of CVA (cerebrovascular accident) 04/09/2013    Pt reports presumptive "stroke" when she was 28y.o.; [facility] for "chronic nerve pain" and has been Rx'd Hydrocodone and Mobic    Past Surgical History  Procedure Laterality Date  . Hysteroscopy  2010    Family History  Problem Relation Age of Onset  . Sickle cell trait Father   . Hypertension Mother   . Anemia Mother   . Asthma Mother   . Cervical cancer Paternal Aunt   . Cervical cancer Paternal Grandmother   . Diabetes Paternal Grandmother   . Diabetes Paternal Grandfather   . Diabetes Paternal Aunt   . Asthma Brother   . Asthma Daughter   . Asthma Son     History  Substance Use Topics  . Smoking status: Never Smoker   . Smokeless tobacco: Never Used  . Alcohol Use: Yes     Comment: socially;prior to pregnancy    Allergies:  Allergies  Allergen Reactions  . Ginger Anaphylaxis    Prescriptions prior to admission  Medication Sig Dispense Refill Last Dose  . acetaminophen (TYLENOL) 325 MG tablet Take 650 mg by mouth every 6 (six) hours as needed for mild pain or headache.    Past Week at Unknown time  .  albuterol (PROVENTIL) (2.5 MG/3ML) 0.083% nebulizer solution Take 2.5 mg by nebulization every 4 (four) hours as needed for wheezing or shortness of breath.   Past Week at Unknown time  . cyclobenzaprine (FLEXERIL) 5 MG tablet Take 1 tablet (5 mg total) by mouth 3 (three) times daily as needed for muscle spasms. 30 tablet 0 Past Month at Unknown time  . NIFEdipine (PROCARDIA) 10 MG capsule Take 1 capsule (10 mg total) by mouth every 4 (four) hours as needed. 12 capsule 0 Past Month at Unknown time  . Prenatal Vit-Fe Fumarate-FA (PRENATAL MULTIVITAMIN) TABS tablet Take 1 tablet by mouth daily.    11/24/2014 at 2200  . albuterol (PROVENTIL HFA;VENTOLIN HFA) 108 (90 BASE) MCG/ACT inhaler Inhale 2 puffs into the lungs every 4 (four) hours as needed for wheezing or shortness of  breath.   More than a month at Unknown time    ROS  See HPI Above Physical Exam   Blood pressure 125/77, pulse 97, temperature 98 F (36.7 C), temperature source Oral, resp. rate 18, height 5\' 5"  (1.651 m), weight 190 lb 8 oz (86.41 kg).  Physical Exam  Constitutional: She is oriented to person, place, and time. She appears well-developed and well-nourished.  Cardiovascular: Normal rate, regular rhythm and normal heart sounds.   Respiratory: Effort normal and breath sounds normal.  GI: Soft. Bowel sounds are normal.  Appears gravid--fundal height AGA, Soft, NT  Musculoskeletal: Normal range of motion.  Neurological: She is alert and oriented to person, place, and time.  Skin: Skin is warm and dry.   FHR: 145 bpm, Mod Var, -Decels, +Accels UC: Irritability graphed, mild contraction palpated SVE: 3/50/-3, Posterior, No Bloody Show or discharge noted  ED Course  Assessment: IUP at 38.2wks Cat I FT Vaginal Pressure Cramping  Plan: -NST reactive -Patient given option for observation or discharge -Patient admits that she is not contracting frequently and requests discharge -Keep appt as scheduled: IOL for GHTN on Monday, Dec 30 -Encouraged to call if any questions or concerns arise prior to next scheduled office visit.  -Discharged to home in stable condition  Cecille Mcclusky LYNN CNM, MSN 11/25/2014 8:25 PM

## 2014-11-25 NOTE — Discharge Instructions (Signed)
Braxton Hicks Contractions °Contractions of the uterus can occur throughout pregnancy. Contractions are not always a sign that you are in labor.  °WHAT ARE BRAXTON HICKS CONTRACTIONS?  °Contractions that occur before labor are called Braxton Hicks contractions, or false labor. Toward the end of pregnancy (32-34 weeks), these contractions can develop more often and may become more forceful. This is not true labor because these contractions do not result in opening (dilatation) and thinning of the cervix. They are sometimes difficult to tell apart from true labor because these contractions can be forceful and people have different pain tolerances. You should not feel embarrassed if you go to the hospital with false labor. Sometimes, the only way to tell if you are in true labor is for your health care provider to look for changes in the cervix. °If there are no prenatal problems or other health problems associated with the pregnancy, it is completely safe to be sent home with false labor and await the onset of true labor. °HOW CAN YOU TELL THE DIFFERENCE BETWEEN TRUE AND FALSE LABOR? °False Labor °· The contractions of false labor are usually shorter and not as hard as those of true labor.   °· The contractions are usually irregular.   °· The contractions are often felt in the front of the lower abdomen and in the groin.   °· The contractions may go away when you walk around or change positions while lying down.   °· The contractions get weaker and are shorter lasting as time goes on.   °· The contractions do not usually become progressively stronger, regular, and closer together as with true labor.   °True Labor °· Contractions in true labor last 30-70 seconds, become very regular, usually become more intense, and increase in frequency.   °· The contractions do not go away with walking.   °· The discomfort is usually felt in the top of the uterus and spreads to the lower abdomen and low back.   °· True labor can be  determined by your health care provider with an exam. This will show that the cervix is dilating and getting thinner.   °WHAT TO REMEMBER °· Keep up with your usual exercises and follow other instructions given by your health care provider.   °· Take medicines as directed by your health care provider.   °· Keep your regular prenatal appointments.   °· Eat and drink lightly if you think you are going into labor.   °· If Braxton Hicks contractions are making you uncomfortable:   °· Change your position from lying down or resting to walking, or from walking to resting.   °· Sit and rest in a tub of warm water.   °· Drink 2-3 glasses of water. Dehydration may cause these contractions.   °· Do slow and deep breathing several times an hour.   °WHEN SHOULD I SEEK IMMEDIATE MEDICAL CARE? °Seek immediate medical care if: °· Your contractions become stronger, more regular, and closer together.   °· You have fluid leaking or gushing from your vagina.   °· You have a fever.   °· You pass blood-tinged mucus.   °· You have vaginal bleeding.   °· You have continuous abdominal pain.   °· You have low back pain that you never had before.   °· You feel your baby's head pushing down and causing pelvic pressure.   °· Your baby is not moving as much as it used to.   °Document Released: 12/17/2005 Document Revised: 12/22/2013 Document Reviewed: 09/28/2013 °Exit[employer]® Patient Information ©2015 [employer]. This information is not intended to replace advice given to you by your health care   provider. Make sure you discuss any questions you have with your health care provider.  Third Trimester of Pregnancy The third trimester is from week 29 through week 42, months 7 through 9. This trimester is when your unborn baby (fetus) is growing very fast. At the end of the ninth month, the unborn baby is about 20 inches in length. It weighs about 6-10 pounds.  HOME CARE  Avoid all smoking, herbs, and alcohol. Avoid drugs not approved by your  doctor. Only take medicine as told by your doctor. Some medicines are safe and some are not during pregnancy. Exercise only as told by your doctor. Stop exercising if you start having cramps. Eat regular, healthy meals. Wear a good support bra if your breasts are tender. Do not use hot tubs, steam rooms, or saunas. Wear your seat belt when driving. Avoid raw meat, uncooked cheese, and liter boxes and soil used by cats. Take your prenatal vitamins. Try taking medicine that helps you poop (stool softener) as needed, and if your doctor approves. Eat more fiber by eating fresh fruit, vegetables, and whole grains. Drink enough fluids to keep your pee (urine) clear or pale yellow. Take warm water baths (sitz baths) to soothe pain or discomfort caused by hemorrhoids. Use hemorrhoid cream if your doctor approves. If you have puffy, bulging veins (varicose veins), wear support hose. Raise (elevate) your feet for 15 minutes, 3-4 times a day. Limit salt in your diet. Avoid heavy lifting, wear low heels, and sit up straight. Rest with your legs raised if you have leg cramps or low back pain. Visit your dentist if you have not gone during your pregnancy. Use a soft toothbrush to brush your teeth. Be gentle when you floss. You can have sex (intercourse) unless your doctor tells you not to. Do not travel far distances unless you must. Only do so with your doctor's approval. Take prenatal classes. Practice driving to the hospital. Pack your hospital bag. Prepare the baby's room. Go to your doctor visits. GET HELP IF: You are not sure if you are in labor or if your water has broken. You are dizzy. You have mild cramps or pressure in your lower belly (abdominal). You have a nagging pain in your belly area. You continue to feel sick to your stomach (nauseous), throw up (vomit), or have watery poop (diarrhea). You have bad smelling fluid coming from your vagina. You have pain with peeing (urination). GET  HELP RIGHT AWAY IF:  You have a fever. You are leaking fluid from your vagina. You are spotting or bleeding from your vagina. You have severe belly cramping or pain. You lose or gain weight rapidly. You have trouble catching your breath and have chest pain. You notice sudden or extreme puffiness (swelling) of your face, hands, ankles, feet, or legs. You have not felt the baby move in over an hour. You have severe headaches that do not go away with medicine. You have vision changes. Document Released: 03/13/2010 Document Revised: 04/13/2013 Document Reviewed: 02/17/2013 [facility] Patient Information 2015 [place], [place]. This information is not intended to replace advice given to you by your health care provider. Make sure you discuss any questions you have with your health care provider.

## 2014-11-25 NOTE — MAU Note (Signed)
Pt presents to MAU with c/o abdominal pressure today starting yesterday and cramping in her back since 1430 this afternoon. Pt states she is going to be induced on Monday for HTN

## 2014-11-25 NOTE — MAU Note (Signed)
Urine in lab 

## 2014-11-26 ENCOUNTER — Encounter: Payer: Self-pay | Admitting: Obstetrics and Gynecology

## 2014-11-26 DIAGNOSIS — Z8759 Personal history of other complications of pregnancy, childbirth and the puerperium: Secondary | ICD-10-CM | POA: Insufficient documentation

## 2014-11-26 DIAGNOSIS — O09299 Supervision of pregnancy with other poor reproductive or obstetric history, unspecified trimester: Secondary | ICD-10-CM | POA: Insufficient documentation

## 2014-11-26 DIAGNOSIS — O4700 False labor before 37 completed weeks of gestation, unspecified trimester: Secondary | ICD-10-CM | POA: Insufficient documentation

## 2014-11-26 DIAGNOSIS — O0930 Supervision of pregnancy with insufficient antenatal care, unspecified trimester: Secondary | ICD-10-CM | POA: Insufficient documentation

## 2014-11-26 DIAGNOSIS — O10919 Unspecified pre-existing hypertension complicating pregnancy, unspecified trimester: Secondary | ICD-10-CM | POA: Diagnosis present

## 2014-11-26 DIAGNOSIS — G894 Chronic pain syndrome: Secondary | ICD-10-CM | POA: Insufficient documentation

## 2014-11-26 DIAGNOSIS — E669 Obesity, unspecified: Secondary | ICD-10-CM | POA: Insufficient documentation

## 2014-11-26 DIAGNOSIS — B951 Streptococcus, group B, as the cause of diseases classified elsewhere: Secondary | ICD-10-CM | POA: Insufficient documentation

## 2014-11-26 NOTE — H&P (Signed)
Melissa [name] is a 28 y.o. female, G3P2002 at 57 weeks, presenting for induction due to chronic hypertension, on Labetalol until 29 weeks due to improved BP.  Has been on Procardia for PTL from 32 weeks, with likely positive impact on BP.  Stopped Procardia last week.    Scheduled for induction due to hx of ? CVA with elevated BPs/pre-eclampsia one week pp in 2014.  Had abnormal CT, but normal MRI at that time.  Patient Active Problem List   Diagnosis Date Noted  . Chronic hypertension complicating or reason for care during pregnancy 11/26/2014  . Positive GBS test 11/26/2014  . Late prenatal care 11/26/2014  . Chronic pain syndrome 11/26/2014  . Threatened premature labor, antepartum--+FFN 09/2014. 11/26/2014  . Obesity (BMI 30-39.9) 11/26/2014  . H/O pre-eclampsia in prior pregnancy, currently pregnant--10 d pp. 11/26/2014  . H/O shoulder dystocia in prior pregnancy, currently pregnant--mild 11/26/2014  . H/O postpartum hemorrhage, currently pregnant 11/26/2014  . Positive fetal fibronectin at 22 weeks to [redacted] weeks gestation 10/11/2014  . History of CVA (cerebrovascular accident) 04/09/2013  . Hx Endometrial polyp 02/16/2013  . Sickle cell trait 02/16/2013  . Asthma 02/16/2013  . Migraines 02/16/2013  Hx ? CVA age 73, followed at [facility]  History of present pregnancy: Patient entered care at 29 2/7 weeks.   EDC of 12/06/14 was established by likely LMP and [place] at 29 weeks.   Anatomy scan:  29 2/7 weeks, with normal findings, although limited anatomy, and an posterior placenta.   Additional [place] evaluations:  32 4/7 weeks for f/u due to late prenatal care:  EFW 1921 gm, 4+4, 29%ile, cervix 3.31, AFI 13.17, 45%ile, small amount funneling noted. 38 2/7 weeks:  BPP 8/8, EFW 3381 g, 7+7, 58%ile, AFI 176.37, 65%ile, vtx.   Significant prenatal events:  Patient contacted [facility] in September to request we accept her back as a patient--[facility],  with uncertain LMP.  Due to being a recent patient, she was accepted again into care.  Upon presentation, she was 29 weeks.  She had been on Labetalol prior to pregnancy, continued from last pregnancy--she was seen at 10 days pp with ? CVA and elevated BP, treated with Magnesium and Labetalol.  Cervix was 1 cm at 31 3/7 weeks, with +FFN at 32 weeks, given betamethasone course.  Placed on Procardia.  Stopped Labetalol after initial visit due to normal BPs.  Continued Procardia until last week, with likely impact on BP.  Plan to induce at 39 weeks due to previous hx of ?  CVA and elevated BP pp.   Last evaluation:  38 2/7 weeks in MAU, normotensive, seen for vaginal pressure.  OB History    Gravida Para Term Preterm AB TAB SAB Ectopic Multiple Living   3 2 2       2     2010--SVB, 42 weeks, 7 lbs, female, no meds, delivered by [facility].  Had PPH, given "shots" to stop the bleeding 09/2013--SVB, 40 5/7 weeks, 14 hour labor, 8+10, female, epidural, mild shoulder dystocia, delivered by Dr. Su Hilt.  Presented 10 days later with elevated BP and severe headache.  CT scan showed questionable ischemic changes, but MRI was WNL.  Treated with magnesium, home on Labetalol.  Followed by Dr. Gwenette Greet after delivery.  Past Medical History  Diagnosis Date  . Placenta previa antepartum 2010    Placenta moved 3 days before baby was born  . Abnormal Pap smear 2010  Repeat pap done;Hysteroscopy;Last pap 2012;was normal  . Infection     BV;may get frequently  . Anemia 2010    Iron supplements PP  . Asthma     Has a nebulizer;triggered by stress  . Stroke 2013    MD thought may have had a stroke and heart attack;has neurologist @ [facility]  . Heart attack     MD thinks may have had a heart attack;Cardiologist @ [facility]  . Nerve pain     Chronic;d/t possible stroke and MI  . Sickle cell trait   . Headache(784.0)   . History of CVA (cerebrovascular accident) 04/09/2013    Pt reports  presumptive "stroke" when she was 28y.o.; [facility] "[facility]" and has been Rx'd Hydrocodone and Mobic   Past Surgical History  Procedure Laterality Date  . Hysteroscopy  2010   Family History: family history includes Anemia in her mother; Asthma in her brother, daughter, mother, and son; Cervical cancer in her paternal aunt and paternal grandmother; Diabetes in her paternal aunt, paternal grandfather, and paternal grandmother; Hypertension in her mother; Sickle cell trait in her father.   Social History:  reports that she has never smoked. She has never used smokeless tobacco. She reports that she drinks alcohol. She reports that she does not use illicit drugs.  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  She reports she will have several family members with her today during labor.  Prenatal Transfer Tool  Maternal Diabetes: No Genetic Screening: Too late to care for testing Maternal Ultrasounds/Referrals: Normal Fetal Ultrasounds or other Referrals:  None Maternal Substance Abuse:  No Significant Maternal Medications:  Meds include: Other: Received betamethasone at 32 weeks. Significant Maternal Lab Results: Lab values include: Group B Strep positive  TDAP:  Received 08/2013 Flu vaccine:  Declined.  ROS:  +FM, occasional contractions  Allergies  Allergen Reactions  . Ginger Anaphylaxis    Dilation: 3.5 Effacement (%): 50 Station: -2 Exam by:: Amsi Grimley, cnm Blood pressure 115/75, pulse 94, temperature 98 F (36.7 C), temperature source Oral, resp. rate 20, height 5\' 5"  (1.651 m), weight 190 lb (86.183 kg).  Chest clear Heart RRR without murmur Abd gravid, NT, FH 38 cm Pelvic: external os 4 cm, internal os 3, 50%, vtx, -2 Ext: DTR 1+, no clonus, trace edema.  FHR: Category 1 UCs:  Irregular, mild.  Prenatal labs:  Drawn at admission, due to no prenatal labs on prenatal. ABO, Rh:  O+ Antibody:  Pending Rubella:    Pending RPR:   Pending HBsAg:   Pending HIV:   Pending GBS: Positive (10/08 0000) Sickle cell/Hgb electrophoresis:  Positive from prior pregnancy Pap:  02/2013 WNL GC:  Negative 10/07/14 Chlamydia:  Negative 10/07/14 Genetic screenings:  Too late to care for testing Glucola:  WNL Other:  Hgb 11.3 at 29 weeks. PIH labs WNL 09/27/14. 24 hour urine 09/27/14 = WNL    Assessment/Plan: IUP at 39 weeks Chronic hypertension--on Procardia until last week for PTL. Hx + FFN--received Betamethasone 10/11/14 GBS positive Late to care at 29 weeks. Favorable cervix No initial prenatal labs Hx PPH Hx mild shoulder dystocia   Plan: Admit to Birthing Suite per consult with Dr. Stefano GaulStringer Routine [facility] orders Plan pitocin per low-dose protocol. NOB labs on admission, with PIH labs and PCR. Pain med/epidural prn. GBS prophylaxis with PCN G per standard dosing. Staff informed regarding hx of PPH and mild shoulder dystocia--will be prepared at delivery.  Nyra CapesLATHAM, VICKICNM, [place] 11/29/2014, 8:32  AM     

## 2014-11-29 ENCOUNTER — Inpatient Hospital Stay (HOSPITAL_COMMUNITY): Payer: Medicaid Other | Admitting: Anesthesiology

## 2014-11-29 ENCOUNTER — Encounter (HOSPITAL_COMMUNITY): Payer: Self-pay

## 2014-11-29 ENCOUNTER — Inpatient Hospital Stay (HOSPITAL_COMMUNITY)
Admission: RE | Admit: 2014-11-29 | Discharge: 2014-12-01 | DRG: 775 | Disposition: A | Discharge status: Home / Self Care | Payer: Medicaid Other | Source: Ambulatory Visit | Attending: Obstetrics and Gynecology | Admitting: Obstetrics and Gynecology

## 2014-11-29 VITALS — BP 106/69 | HR 82 | Temp 98.4°F | Resp 18 | Ht 65.0 in | Wt 190.0 lb

## 2014-11-29 DIAGNOSIS — O99344 Other mental disorders complicating childbirth: Secondary | ICD-10-CM | POA: Diagnosis present

## 2014-11-29 DIAGNOSIS — O99824 Streptococcus B carrier state complicating childbirth: Secondary | ICD-10-CM | POA: Diagnosis present

## 2014-11-29 DIAGNOSIS — D649 Anemia, unspecified: Secondary | ICD-10-CM | POA: Diagnosis present

## 2014-11-29 DIAGNOSIS — O9902 Anemia complicating childbirth: Secondary | ICD-10-CM | POA: Diagnosis present

## 2014-11-29 DIAGNOSIS — E669 Obesity, unspecified: Secondary | ICD-10-CM | POA: Diagnosis present

## 2014-11-29 DIAGNOSIS — O9912 Other diseases of the blood and blood-forming organs and certain disorders involving the immune mechanism complicating childbirth: Secondary | ICD-10-CM | POA: Diagnosis present

## 2014-11-29 DIAGNOSIS — O0933 Supervision of pregnancy with insufficient antenatal care, third trimester: Secondary | ICD-10-CM

## 2014-11-29 DIAGNOSIS — D696 Thrombocytopenia, unspecified: Secondary | ICD-10-CM | POA: Diagnosis present

## 2014-11-29 DIAGNOSIS — F53 Puerperal psychosis: Secondary | ICD-10-CM | POA: Diagnosis present

## 2014-11-29 DIAGNOSIS — O4700 False labor before 37 completed weeks of gestation, unspecified trimester: Secondary | ICD-10-CM

## 2014-11-29 DIAGNOSIS — Z6830 Body mass index (BMI) 30.0-30.9, adult: Secondary | ICD-10-CM

## 2014-11-29 DIAGNOSIS — Z3A39 39 weeks gestation of pregnancy: Secondary | ICD-10-CM | POA: Diagnosis present

## 2014-11-29 DIAGNOSIS — O10919 Unspecified pre-existing hypertension complicating pregnancy, unspecified trimester: Secondary | ICD-10-CM | POA: Diagnosis present

## 2014-11-29 DIAGNOSIS — O09293 Supervision of pregnancy with other poor reproductive or obstetric history, third trimester: Secondary | ICD-10-CM

## 2014-11-29 DIAGNOSIS — O99214 Obesity complicating childbirth: Secondary | ICD-10-CM | POA: Diagnosis present

## 2014-11-29 DIAGNOSIS — B951 Streptococcus, group B, as the cause of diseases classified elsewhere: Secondary | ICD-10-CM

## 2014-11-29 DIAGNOSIS — O10913 Unspecified pre-existing hypertension complicating pregnancy, third trimester: Secondary | ICD-10-CM

## 2014-11-29 DIAGNOSIS — G894 Chronic pain syndrome: Secondary | ICD-10-CM

## 2014-11-29 DIAGNOSIS — Z3483 Encounter for supervision of other normal pregnancy, third trimester: Secondary | ICD-10-CM | POA: Diagnosis present

## 2014-11-29 DIAGNOSIS — O99119 Other diseases of the blood and blood-forming organs and certain disorders involving the immune mechanism complicating pregnancy, unspecified trimester: Secondary | ICD-10-CM

## 2014-11-29 LAB — COMPREHENSIVE METABOLIC PANEL
ALT: 10 U/L (ref 0–35)
AST: 20 U/L (ref 0–37)
Albumin: 2.8 g/dL — ABNORMAL LOW (ref 3.5–5.2)
Alkaline Phosphatase: 160 U/L — ABNORMAL HIGH (ref 39–117)
Anion gap: 16 — ABNORMAL HIGH (ref 5–15)
BUN: 4 mg/dL — ABNORMAL LOW (ref 6–23)
CO2: 19 meq/L (ref 19–32)
Calcium: 9.2 mg/dL (ref 8.4–10.5)
Chloride: 102 mEq/L (ref 96–112)
Creatinine, Ser: 0.64 mg/dL (ref 0.50–1.10)
GLUCOSE: 80 mg/dL (ref 70–99)
Potassium: 4 mEq/L (ref 3.7–5.3)
SODIUM: 137 meq/L (ref 137–147)
Total Bilirubin: 0.6 mg/dL (ref 0.3–1.2)
Total Protein: 6.8 g/dL (ref 6.0–8.3)

## 2014-11-29 LAB — PROTEIN / CREATININE RATIO, URINE
Creatinine, Urine: 35.87 mg/dL
Protein Creatinine Ratio: 0.11 (ref 0.00–0.15)
Total Protein, Urine: 4 mg/dL

## 2014-11-29 LAB — CBC
HCT: 34.4 % — ABNORMAL LOW (ref 36.0–46.0)
HCT: 33.5 % — ABNORMAL LOW (ref 36.0–46.0)
Hemoglobin: 11.2 g/dL — ABNORMAL LOW (ref 12.0–15.0)
Hemoglobin: 11.1 g/dL — ABNORMAL LOW (ref 12.0–15.0)
MCH: 26.4 pg (ref 26.0–34.0)
MCH: 26.6 pg (ref 26.0–34.0)
MCHC: 32.6 g/dL (ref 30.0–36.0)
MCHC: 33.1 g/dL (ref 30.0–36.0)
MCV: 80.9 fL (ref 78.0–100.0)
MCV: 80.3 fL (ref 78.0–100.0)
PLATELETS: 136 10*3/uL — AB (ref 150–400)
Platelets: 132 10*3/uL — ABNORMAL LOW (ref 150–400)
RBC: 4.25 MIL/uL (ref 3.87–5.11)
RBC: 4.17 MIL/uL (ref 3.87–5.11)
RDW: 15.1 % (ref 11.5–15.5)
RDW: 14.8 % (ref 11.5–15.5)
WBC: 5.9 10*3/uL (ref 4.0–10.5)
WBC: 7.9 10*3/uL (ref 4.0–10.5)

## 2014-11-29 LAB — TYPE AND SCREEN
ABO/RH(D): O POS
Antibody Screen: NEGATIVE

## 2014-11-29 LAB — DIFFERENTIAL
BASOS PCT: 0 % (ref 0–1)
Basophils Absolute: 0 10*3/uL (ref 0.0–0.1)
EOS PCT: 2 % (ref 0–5)
Eosinophils Absolute: 0.1 10*3/uL (ref 0.0–0.7)
LYMPHS PCT: 28 % (ref 12–46)
Lymphs Abs: 1.6 10*3/uL (ref 0.7–4.0)
Monocytes Absolute: 0.4 10*3/uL (ref 0.1–1.0)
Monocytes Relative: 6 % (ref 3–12)
Neutro Abs: 3.7 10*3/uL (ref 1.7–7.7)
Neutrophils Relative %: 64 % (ref 43–77)

## 2014-11-29 LAB — RAPID HIV SCREEN (WH-MAU): Rapid HIV Screen: NONREACTIVE

## 2014-11-29 LAB — URIC ACID: Uric Acid, Serum: 6.1 mg/dL (ref 2.4–7.0)

## 2014-11-29 LAB — LACTATE DEHYDROGENASE: LDH: 221 U/L (ref 94–250)

## 2014-11-29 LAB — RUBELLA SCREEN: Rubella: 2.14 Index — ABNORMAL HIGH (ref <0.90)

## 2014-11-29 LAB — HEPATITIS B SURFACE ANTIGEN: Hepatitis B Surface Ag: NEGATIVE

## 2014-11-29 LAB — RPR

## 2014-11-29 MED ORDER — PHENYLEPHRINE 40 MCG/ML (10ML) SYRINGE FOR IV PUSH (FOR BLOOD PRESSURE SUPPORT)
80.0000 ug | PREFILLED_SYRINGE | INTRAVENOUS | Status: DC | PRN
  Filled 2014-11-29: qty 10
  Filled 2014-11-29: qty 2

## 2014-11-29 MED ORDER — OXYCODONE-ACETAMINOPHEN 5-325 MG PO TABS: 2.0000 | ORAL_TABLET | ORAL | Status: DC | PRN

## 2014-11-29 MED ORDER — EPHEDRINE 5 MG/ML INJ
10.0000 mg | INTRAVENOUS | Status: DC | PRN
  Filled 2014-11-29: qty 2

## 2014-11-29 MED ORDER — ACETAMINOPHEN 325 MG PO TABS: 650.0000 mg | ORAL_TABLET | ORAL | Status: DC | PRN

## 2014-11-29 MED ORDER — TETANUS-DIPHTH-ACELL PERTUSSIS 5-2.5-18.5 LF-MCG/0.5 IM SUSP: 0.5000 mL | Freq: Once | INTRAMUSCULAR | Status: DC

## 2014-11-29 MED ORDER — DIPHENHYDRAMINE HCL 50 MG/ML IJ SOLN: 12.5000 mg | INTRAMUSCULAR | Status: DC | PRN

## 2014-11-29 MED ORDER — ZOLPIDEM TARTRATE 5 MG PO TABS: 5.0000 mg | ORAL_TABLET | Freq: Every evening | ORAL | Status: DC | PRN

## 2014-11-29 MED ORDER — LIDOCAINE HCL (PF) 1 % IJ SOLN
30.0000 mL | INTRAMUSCULAR | Status: DC | PRN
  Filled 2014-11-29: qty 30

## 2014-11-29 MED ORDER — LANOLIN HYDROUS EX OINT: TOPICAL_OINTMENT | CUTANEOUS | Status: DC | PRN

## 2014-11-29 MED ORDER — WITCH HAZEL-GLYCERIN EX PADS: 1.0000 "application " | MEDICATED_PAD | CUTANEOUS | Status: DC | PRN

## 2014-11-29 MED ORDER — MISOPROSTOL 25 MCG QUARTER TABLET
25.0000 ug | ORAL_TABLET | ORAL | Status: DC | PRN
  Filled 2014-11-29: qty 1

## 2014-11-29 MED ORDER — ONDANSETRON HCL 4 MG/2ML IJ SOLN: 4.0000 mg | INTRAMUSCULAR | Status: DC | PRN

## 2014-11-29 MED ORDER — PHENYLEPHRINE 40 MCG/ML (10ML) SYRINGE FOR IV PUSH (FOR BLOOD PRESSURE SUPPORT)
80.0000 ug | PREFILLED_SYRINGE | INTRAVENOUS | Status: DC | PRN
  Filled 2014-11-29: qty 2

## 2014-11-29 MED ORDER — LIDOCAINE HCL (PF) 1 % IJ SOLN
INTRAMUSCULAR | Status: DC | PRN
  Administered 2014-11-29 (×2): 4 mL

## 2014-11-29 MED ORDER — IBUPROFEN 600 MG PO TABS
600.0000 mg | ORAL_TABLET | Freq: Four times a day (QID) | ORAL | Status: DC
Start: 2014-11-30 — End: 2014-12-01
  Administered 2014-11-30 – 2014-12-01 (×6): 600 mg via ORAL
  Filled 2014-11-29 (×6): qty 1

## 2014-11-29 MED ORDER — PENICILLIN G POTASSIUM 5000000 UNITS IJ SOLR
2.5000 10*6.[IU] | INTRAVENOUS | Status: DC
  Administered 2014-11-29 (×2): 2.5 10*6.[IU] via INTRAVENOUS
  Filled 2014-11-29 (×5): qty 2.5

## 2014-11-29 MED ORDER — DEXTROSE 5 % IV SOLN
5.0000 10*6.[IU] | Freq: Once | INTRAVENOUS | Status: AC
  Administered 2014-11-29: 5 10*6.[IU] via INTRAVENOUS
  Filled 2014-11-29: qty 5

## 2014-11-29 MED ORDER — FENTANYL 2.5 MCG/ML BUPIVACAINE 1/10 % EPIDURAL INFUSION (WH - ANES)
14.0000 mL/h | INTRAMUSCULAR | Status: DC | PRN
  Administered 2014-11-29: 14 mL/h via EPIDURAL
  Filled 2014-11-29: qty 125

## 2014-11-29 MED ORDER — ONDANSETRON HCL 4 MG PO TABS: 4.0000 mg | ORAL_TABLET | ORAL | Status: DC | PRN

## 2014-11-29 MED ORDER — TERBUTALINE SULFATE 1 MG/ML IJ SOLN: 0.2500 mg | Freq: Once | INTRAMUSCULAR | Status: DC | PRN

## 2014-11-29 MED ORDER — OXYTOCIN 40 UNITS IN LACTATED RINGERS INFUSION - SIMPLE MED
1.0000 m[IU]/min | INTRAVENOUS | Status: DC
  Administered 2014-11-29: 1 m[IU]/min via INTRAVENOUS

## 2014-11-29 MED ORDER — DIPHENHYDRAMINE HCL 25 MG PO CAPS: 25.0000 mg | ORAL_CAPSULE | Freq: Four times a day (QID) | ORAL | Status: DC | PRN

## 2014-11-29 MED ORDER — LACTATED RINGERS IV SOLN: 500.0000 mL | INTRAVENOUS | Status: DC | PRN

## 2014-11-29 MED ORDER — FLEET ENEMA 7-19 GM/118ML RE ENEM: 1.0000 | ENEMA | RECTAL | Status: DC | PRN

## 2014-11-29 MED ORDER — OXYTOCIN BOLUS FROM INFUSION: 500.0000 mL | INTRAVENOUS | Status: DC

## 2014-11-29 MED ORDER — BENZOCAINE-MENTHOL 20-0.5 % EX AERO: 1.0000 "application " | INHALATION_SPRAY | CUTANEOUS | Status: DC | PRN

## 2014-11-29 MED ORDER — SIMETHICONE 80 MG PO CHEW: 80.0000 mg | CHEWABLE_TABLET | ORAL | Status: DC | PRN

## 2014-11-29 MED ORDER — ONDANSETRON HCL 4 MG/2ML IJ SOLN: 4.0000 mg | Freq: Four times a day (QID) | INTRAMUSCULAR | Status: DC | PRN

## 2014-11-29 MED ORDER — PRENATAL MULTIVITAMIN CH
1.0000 | ORAL_TABLET | Freq: Every day | ORAL | Status: DC
  Administered 2014-11-30: 1 via ORAL
  Filled 2014-11-29: qty 1

## 2014-11-29 MED ORDER — LACTATED RINGERS IV SOLN
INTRAVENOUS | Status: DC
Start: 2014-11-29 — End: 2014-11-29
  Administered 2014-11-29: 08:00:00 via INTRAVENOUS

## 2014-11-29 MED ORDER — BUTORPHANOL TARTRATE 1 MG/ML IJ SOLN: 1.0000 mg | INTRAMUSCULAR | Status: DC | PRN

## 2014-11-29 MED ORDER — FENTANYL 2.5 MCG/ML BUPIVACAINE 1/10 % EPIDURAL INFUSION (WH - ANES)
INTRAMUSCULAR | Status: DC | PRN
  Administered 2014-11-29: 14 mL/h via EPIDURAL

## 2014-11-29 MED ORDER — OXYCODONE-ACETAMINOPHEN 5-325 MG PO TABS: 1.0000 | ORAL_TABLET | ORAL | Status: DC | PRN

## 2014-11-29 MED ORDER — OXYCODONE-ACETAMINOPHEN 5-325 MG PO TABS
1.0000 | ORAL_TABLET | ORAL | Status: DC | PRN
  Administered 2014-11-30: 1 via ORAL
  Filled 2014-11-29: qty 1

## 2014-11-29 MED ORDER — LACTATED RINGERS IV SOLN
500.0000 mL | Freq: Once | INTRAVENOUS | Status: AC
  Administered 2014-11-29: 500 mL via INTRAVENOUS

## 2014-11-29 MED ORDER — OXYTOCIN 40 UNITS IN LACTATED RINGERS INFUSION - SIMPLE MED
62.5000 mL/h | INTRAVENOUS | Status: DC
  Filled 2014-11-29: qty 1000

## 2014-11-29 MED ORDER — CITRIC ACID-SODIUM CITRATE 334-500 MG/5ML PO SOLN
30.0000 mL | ORAL | Status: DC | PRN
Start: 2014-11-29 — End: 2014-11-29

## 2014-11-29 MED ORDER — DIBUCAINE 1 % RE OINT: 1.0000 "application " | TOPICAL_OINTMENT | RECTAL | Status: DC | PRN

## 2014-11-29 MED ORDER — SENNOSIDES-DOCUSATE SODIUM 8.6-50 MG PO TABS
2.0000 | ORAL_TABLET | ORAL | Status: DC
Start: 2014-11-30 — End: 2014-12-01
  Administered 2014-11-30 (×2): 2 via ORAL
  Filled 2014-11-29 (×2): qty 2

## 2014-11-29 NOTE — Progress Notes (Signed)
  Subjective: Comfortable, feels some contractions, but not uncomfortable.  Objective: BP 126/77 mmHg  Pulse 68  Temp(Src) 98.3 F (36.8 C) (Oral)  Resp 20  Ht 5\' 5"  (1.651 m)  Wt 190 lb (86.183 kg)  BMI 31.62 kg/m2      FHT: Category 1 UC:   regular, every 4-5 minutes SVE:   Deferred at present  Pitocin at 7 mu/min  Assessment:  Induction for chronic HTN GBS positive  Plan: Continue pitocin Recheck cervix when UCs become stronger.  Nigel Bridgeman CNM 11/29/2014, 12:02 PM

## 2014-11-29 NOTE — Anesthesia Preprocedure Evaluation (Addendum)
Anesthesia Evaluation  Patient identified by MRN, date of birth, ID band Patient awake    Reviewed: Allergy & Precautions, H&P , Patient's Chart, lab work & pertinent test results  Airway Mallampati: III  TM Distance: >3 FB Neck ROM: Full    Dental no notable dental hx. (+) Teeth Intact   Pulmonary asthma ,  breath sounds clear to auscultation  Pulmonary exam normal       Cardiovascular hypertension, Pt. on medications + Past MI Rhythm:Regular Rate:Normal     Neuro/Psych  Headaches,  Neuromuscular disease CVA, No Residual Symptoms negative psych ROS   GI/Hepatic Neg liver ROS, GERD-  Medicated and Controlled,  Endo/Other  Obesity  Renal/GU negative Renal ROS  negative genitourinary   Musculoskeletal Low back pain   Abdominal (+) + obese,   Peds  Hematology  (+) Sickle cell trait and anemia , Thrombocytopenia- mild   Anesthesia Other Findings   Reproductive/Obstetrics (+) Pregnancy                            Anesthesia Physical Anesthesia Plan  ASA: III  Anesthesia Plan: Epidural   Post-op Pain Management:    Induction:   Airway Management Planned: Natural Airway  Additional Equipment:   Intra-op Plan:   Post-operative Plan:   Informed Consent: I have reviewed the patients History and Physical, chart, labs and discussed the procedure including the risks, benefits and alternatives for the proposed anesthesia with the patient or authorized representative who has indicated his/her understanding and acceptance.     Plan Discussed with: Anesthesiologist  Anesthesia Plan Comments:         Anesthesia Quick Evaluation

## 2014-11-29 NOTE — Progress Notes (Signed)
  Subjective: Comfortable with epidural.  Objective: BP 107/77 mmHg  Pulse 79  Temp(Src) 97.9 F (36.6 C) (Oral)  Resp 20  Ht 5\' 5"  (1.651 m)  Wt 190 lb (86.183 kg)  BMI 31.62 kg/m2  SpO2 100%      FHT: Category 1 UC:   regular, every 2-3 minutes SVE:   Dilation: 7 Effacement (%): 70 Station: -1 Exam by:: Jaimie Pippins AROM--clear fluid Pitocin on 10 mu/min  Assessment:  Progressive labor  Plan: Continue current care.  Nigel Bridgeman CNM 11/29/2014, 4:29 PM

## 2014-11-29 NOTE — Anesthesia Procedure Notes (Signed)
Epidural Patient location during procedure: OB Start time: 11/29/2014 2:44 PM  Staffing Anesthesiologist: Lynia Landry A. Performed by: anesthesiologist   Preanesthetic Checklist Completed: patient identified, site marked, surgical consent, pre-op evaluation, timeout performed, IV checked, risks and benefits discussed and monitors and equipment checked  Epidural Patient position: sitting Prep: site prepped and draped and DuraPrep Patient monitoring: continuous pulse ox and blood pressure Approach: midline Location: L3-L4 Injection technique: LOR air  Needle:  Needle type: Tuohy  Needle gauge: 17 G Needle length: 9 cm and 9 Needle insertion depth: 5 cm cm Catheter type: closed end flexible Catheter size: 19 Gauge Catheter at skin depth: 10 cm Test dose: negative and Other  Assessment Events: blood not aspirated, injection not painful, no injection resistance, negative IV test and no paresthesia  Additional Notes Patient identified. Risks and benefits discussed including failed block, incomplete  Pain control, post dural puncture headache, nerve damage, paralysis, blood pressure Changes, nausea, vomiting, reactions to medications-both toxic and allergic and post Partum back pain. All questions were answered. Patient expressed understanding and wished to proceed. Sterile technique was used throughout procedure. Epidural site was Dressed with sterile barrier dressing. No paresthesias, signs of intravascular injection Or signs of intrathecal spread were encountered.  Patient was more comfortable after the epidural was dosed. Please see RN's note for documentation of vital signs and FHR which are stable.

## 2014-11-29 NOTE — Progress Notes (Signed)
  Subjective: Feeling some pressure, but no urge to push.  Objective: BP 107/77 mmHg  Pulse 79  Temp(Src) 97.9 F (36.6 C) (Oral)  Resp 20  Ht 5\' 5"  (1.651 m)  Wt 190 lb (86.183 kg)  BMI 31.62 kg/m2  SpO2 100%      FHT: Category 1 UC:   regular, every 3 minutes SVE:   8 cm, 80%, vtx, -1/0  Pitocin at 10 mu/min  Assessment:  Active labor  Plan: Position to facilitate rotation/descent. Await increased pressure/urge to push.  Nigel Bridgeman CNM 11/29/2014, 5:23 PM

## 2014-11-29 NOTE — Progress Notes (Signed)
  Subjective: More uncomfortable with UCs--"thinking about epidural".  Objective: BP 100/63 mmHg  Pulse 78  Temp(Src) 98.3 F (36.8 C) (Oral)  Resp 20  Ht 5\' 5"  (1.651 m)  Wt 190 lb (86.183 kg)  BMI 31.62 kg/m2    Has received 2 doses PCN.   FHT: Category 1 UC:   regular, every 3-4 minutes SVE:   Dilation: 6 Effacement (%): 70 Station: -2 Exam by:: Arrietty Dercole  IBOW Pitocin at 9 mu/min  Assessment:  Progressive labor GBS positive Normotensive  Plan: Epidural as desired by patient Plan AROM after epidural placed and functional.  Bryauna Byrum CNM 11/29/2014, 2:22 PM

## 2014-11-29 NOTE — Plan of Care (Signed)
Problem: Phase I Progression Outcomes Goal: Voiding adequately Outcome: Completed/Met Date Met:  11/29/14     

## 2014-11-30 LAB — CBC
HCT: 32.1 % — ABNORMAL LOW (ref 36.0–46.0)
Hemoglobin: 10.4 g/dL — ABNORMAL LOW (ref 12.0–15.0)
MCH: 26.1 pg (ref 26.0–34.0)
MCHC: 32.4 g/dL (ref 30.0–36.0)
MCV: 80.7 fL (ref 78.0–100.0)
PLATELETS: 120 10*3/uL — AB (ref 150–400)
RBC: 3.98 MIL/uL (ref 3.87–5.11)
RDW: 15 % (ref 11.5–15.5)
WBC: 8.8 10*3/uL (ref 4.0–10.5)

## 2014-11-30 MED ORDER — INFLUENZA VAC SPLIT QUAD 0.5 ML IM SUSY
0.5000 mL | PREFILLED_SYRINGE | Freq: Once | INTRAMUSCULAR | Status: AC
  Administered 2014-11-30: 0.5 mL via INTRAMUSCULAR
  Filled 2014-11-30: qty 0.5

## 2014-11-30 NOTE — Progress Notes (Signed)
Melissa [name]  Post Partum Day 1:S/P SVD  Subjective: Patient up ad lib, denies syncope or dizziness. Reports consuming regular diet without issues and denies N/V. Denies issues with urination and reports bleeding is "lighter now."  Also denies issues with HA, visual disturbance, epigastric pain, numbness/tingling, and SOB.  Patient is breastfeeding and reports going well.  Desires nexplanon for postpartum contraception.  Patient requests discharge today at 24 hours.  Objective: Filed Vitals:   11/29/14 1946 11/29/14 2058 11/29/14 2159 11/30/14 0219  BP: 125/52 116/62 119/71 122/73  Pulse: 74 86 76 64  Temp:    98.3 F (36.8 C)  TempSrc:    Oral  Resp:  20 18 18   Height:      Weight:      SpO2:  100% 100%     Recent Labs  11/29/14 1841 11/30/14 0614  HGB 11.1* 10.4*  HCT 33.5* 32.1*    Physical Exam:  General appearance: alert, cooperative and no distress Lungs: clear to auscultation bilaterally Breasts: normal appearance, no masses or tenderness Heart: regular rate and rhythm, S1, S2 normal, no murmur, click, rub or gallop Abdomen: soft, non-tender; bowel sounds normal; no masses,  no organomegaly Extremities: edema is non pitting and Homans sign is negative, no sign of DVT Skin: Skin color, texture, turgor normal. No rashes or lesions Lochia: Small Laceration: None Uterine Fundus: firm, U/-1 DVT Evaluation: Calf/Ankle edema is present.  Assessment S/P Vaginal Delivery-Day 1 Normal Involution Breastfeeding Nexplanon BCM Hemodynamically Stable  Plan: Patient okay for discharge if pediatrician discharges infant Continue current care other care as ordered Dr. Carmela Hurt to be updated on patient status   Melissa [name], CNM 11/30/2014, 9:15 AM

## 2014-11-30 NOTE — Discharge Summary (Signed)
Vaginal Delivery Discharge Summary  ALL information will be verified prior to discharge  Melissa [name]  DOB:    May 26, 1986 MRN:    [id number] CSN:    [id number]  Date of admission:                  11/29/14  Date of discharge:                   12/01/14  Procedures this admission: SVD  Date of Delivery: 11/29/14  Newborn Data:  Live born  Information for the patient's newborn:  Melissa, [name] Girl Kalia [[id number]]  female  APGAR ,   Live born female  Birth Weight: 7 lb 10.6 oz (3475 g) APGAR: 8, 9  Weight    Home with mother. Name: Melissa [name]   History of Present Illness: Ms. Melissa [name] is a 28 y.o. female, G3P3003, who presents at [redacted]w[redacted]d weeks gestation. The patient has been followed at the [facility]cology division of [facility]. She was admitted induction of labor d/t CHTN. Her pregnancy has been complicated by:  Patient Active Problem List   Diagnosis Date Noted  . Vaginal delivery 11/29/2014  . Benign gestational thrombocytopenia 11/29/2014  . Chronic hypertension complicating or reason for care during pregnancy 11/26/2014  . Positive GBS test 11/26/2014  . Late prenatal care 11/26/2014  . Chronic pain syndrome 11/26/2014  . Threatened premature labor, antepartum--+FFN 09/2014. 11/26/2014  . Obesity (BMI 30-39.9) 11/26/2014  . H/O pre-eclampsia in prior pregnancy, currently pregnant--10 d pp. 11/26/2014  . H/O shoulder dystocia in prior pregnancy, currently pregnant--mild 11/26/2014  . H/O postpartum hemorrhage, currently pregnant 11/26/2014  . Positive fetal fibronectin at 22 weeks to [redacted] weeks gestation 10/11/2014  . History of CVA (cerebrovascular accident) 04/09/2013  . Hx Endometrial polyp 02/16/2013  . Sickle cell trait 02/16/2013  . Asthma 02/16/2013  . Migraines 02/16/2013    Hospital course: The patient was admitted for IOL for [facility].   Her labor was complicated w/GBS+ inadequately treated. She proceeded to have a  vaginal delivery of a healthy infant. Her delivery was complicated w/tight shoulders noted, resolved with continued McRoberts and suprapubic pressure. Her postpartum course was not complicated. She was discharged to home on postpartum day 2 doing well.  Feeding: breast  Contraception: Nexplanon at 6 weeks PP, Depo now Pt understands the risks birth control are not limited to irregular bleeding, formation of DVT, fluid fluctuations, elevation in blood pressure, stroke, breast tenderness and liver damage.  She states she will report any serious side effects.  She has been given verbal and written instructions and voiced a clear understanding.    Discharge hemoglobin: HEMOGLOBIN  Date Value Ref Range Status  11/30/2014 10.4* 12.0 - 15.0 g/dL Final   HCT  Date Value Ref Range Status  11/30/2014 32.1* 36.0 - 46.0 % Final    PreNatal Labs ABO, Rh: --/--/O POS (11/30 0750)   Antibody: NEG (11/30 0750) Rubella:   immune RPR: NON REAC (11/30 0750)  HBsAg: NEGATIVE (11/30 0750)  HIV:   NR GBS: Positive (10/08 0000)  Discharge Physical Exam:  General: alert and cooperative Lochia: appropriate Uterine Fundus: firm Incision: healing well DVT Evaluation: No evidence of DVT seen on physical exam.  Intrapartum Procedures: spontaneous vaginal delivery Postpartum Procedures: none Complications-Operative and Postpartum: skid mark on inner labia degree perineal laceration  Discharge Diagnoses: Term Pregnancy-delivered, anemia, CHTN  Activity:           pelvic  rest Diet:                routine Medications: PNV, Ibuprofen, Iron and Percocet Condition:      stable     Postpartum Teaching: Nutrition, exercise, return to work or school, family visits, sexual activity, home rest, vaginal bleeding, pelvic rest, family planning, s/s of PPD, breast care and peri-care   Discharge to: home  Follow-up Information    Follow up with [facility]. Schedule an  appointment as soon as possible for a visit in 6 weeks.   Specialty:  Obstetrics and Gynecology   Why:  Postpartum check up   Contact information:   [address] [place] [address] [phone]       Crockett Rallo, CNM, MSN 11/30/2014. 8:52 PM   Postpartum Care After Vaginal Delivery  After you deliver your newborn (postpartum period), the usual stay in the hospital is 24 72 hours. If there were problems with your labor or delivery, or if you have other medical problems, you might be in the hospital longer.  While you are in the hospital, you will receive help and instructions on how to care for yourself and your newborn during the postpartum period.  While you are in the hospital:  Be sure to tell your nurses if you have pain or discomfort, as well as where you feel the pain and what makes the pain worse.  If you had an incision made near your vagina (episiotomy) or if you had some tearing during delivery, the nurses may put ice packs on your episiotomy or tear. The ice packs may help to reduce the pain and swelling.  If you are breastfeeding, you may feel uncomfortable contractions of your uterus for a couple of weeks. This is normal. The contractions help your uterus get back to normal size.  It is normal to have some bleeding after delivery.  For the first 1 3 days after delivery, the flow is red and the amount may be similar to a period.  It is common for the flow to start and stop.  In the first few days, you may pass some small clots. Let your nurses know if you begin to pass large clots or your flow increases.  Do not  flush blood clots down the toilet before having the nurse look at them.  During the next 3 10 days after delivery, your flow should become more watery and pink or brown-tinged in color.  Ten to fourteen days after delivery, your flow should be a small amount of yellowish-white discharge.  The amount of your flow will  decrease over the first few weeks after delivery. Your flow may stop in 6 8 weeks. Most women have had their flow stop by 12 weeks after delivery.  You should change your sanitary pads frequently.  Wash your hands thoroughly with soap and water for at least 20 seconds after changing pads, using the toilet, or before holding or feeding your newborn.  You should feel like you need to empty your bladder within the first 6 8 hours after delivery.  In case you become weak, lightheaded, or faint, call your nurse before you get out of bed for the first time and before you take a shower for the first time.  Within the first few days after delivery, your breasts may begin to feel tender and full. This is called engorgement. Breast tenderness usually goes away within 48 72 hours after engorgement occurs. You  may also notice milk leaking from your breasts. If you are not breastfeeding, do not stimulate your breasts. Breast stimulation can make your breasts produce more milk.  Spending as much time as possible with your newborn is very important. During this time, you and your newborn can feel close and get to know each other. Having your newborn stay in your room (rooming in) will help to strengthen the bond with your newborn. It will give you time to get to know your newborn and become comfortable caring for your newborn.  Your hormones change after delivery. Sometimes the hormone changes can temporarily cause you to feel sad or tearful. These feelings should not last more than a few days. If these feelings last longer than that, you should talk to your caregiver.  If desired, talk to your caregiver about methods of family planning or contraception.  Talk to your caregiver about immunizations. Your caregiver may want you to have the following immunizations before leaving the hospital:  Tetanus, diphtheria, and pertussis (Tdap) or tetanus and diphtheria (Td) immunization. It is very important that you and  your family (including grandparents) or others caring for your newborn are up-to-date with the Tdap or Td immunizations. The Tdap or Td immunization can help protect your newborn from getting ill.  Rubella immunization.  Varicella (chickenpox) immunization.  Influenza immunization. You should receive this annual immunization if you did not receive the immunization during your pregnancy. Document Released: 10/14/2007 Document Revised: 09/10/2012 Document Reviewed: 08/13/2012 [facility] Patient Information 2014 [place], [place].   Postpartum Depression and Baby Blues  The postpartum period begins right after the birth of a baby. During this time, there is often a great amount of joy and excitement. It is also a time of considerable changes in the life of the parent(s). Regardless of how many times a mother gives birth, each child brings new challenges and dynamics to the family. It is not unusual to have feelings of excitement accompanied by confusing shifts in moods, emotions, and thoughts. All mothers are at risk of developing postpartum depression or the "baby blues." These mood changes can occur right after giving birth, or they may occur many months after giving birth. The baby blues or postpartum depression can be mild or severe. Additionally, postpartum depression can resolve rather quickly, or it can be a long-term condition. CAUSES Elevated hormones and their rapid decline are thought to be a main cause of postpartum depression and the baby blues. There are a number of hormones that radically change during and after pregnancy. Estrogen and progesterone usually decrease immediately after delivering your baby. The level of thyroid hormone and various cortisol steroids also rapidly drop. Other factors that play a major role in these changes include major life events and genetics.  RISK FACTORS If you have any of the following risks for the baby blues or postpartum depression, know what symptoms to  watch out for during the postpartum period. Risk factors that may increase the likelihood of getting the baby blues or postpartum depression include: 1. Havinga personal or family history of depression. 2. Having depression while being pregnant. 3. Having premenstrual or oral contraceptive-associated mood issues. 4. Having exceptional life stress. 5. Having marital conflict. 6. Lacking a social support network. 7. Having a baby with special needs. 8. Having health problems such as diabetes. SYMPTOMS Baby blues symptoms include:  Brief fluctuations in mood, such as going from extreme happiness to sadness.  Decreased concentration.  Difficulty sleeping.  Crying spells, tearfulness.  Irritability.  Anxiety. Postpartum depression symptoms typically begin within the first month after giving birth. These symptoms include:  Difficulty sleeping or excessive sleepiness.  Marked weight loss.  Agitation.  Feelings of worthlessness.  Lack of interest in activity or food. Postpartum psychosis is a very concerning condition and can be dangerous. Fortunately, it is rare. Displaying any of the following symptoms is cause for immediate medical attention. Postpartum psychosis symptoms include:  Hallucinations and delusions.  Bizarre or disorganized behavior.  Confusion or disorientation. DIAGNOSIS  A diagnosis is made by an evaluation of your symptoms. There are no medical or lab tests that lead to a diagnosis, but there are various questionnaires that a caregiver may use to identify those with the baby blues, postpartum depression, or psychosis. Often times, a screening tool called the [place] Postnatal Depression Scale is used to diagnose depression in the postpartum period.  TREATMENT The baby blues usually goes away on its own in 1 to 2 weeks. Social support is often all that is needed. You should be encouraged to get adequate sleep and rest. Occasionally, you may be given medicines  to help you sleep.  Postpartum depression requires treatment as it can last several months or longer if it is not treated. Treatment may include individual or group therapy, medicine, or both to address any social, physiological, and psychological factors that may play a role in the depression. Regular exercise, a healthy diet, rest, and social support may also be strongly recommended.  Postpartum psychosis is more serious and needs treatment right away. Hospitalization is often needed. HOME CARE INSTRUCTIONS  Get as much rest as you can. Nap when the baby sleeps.  Exercise regularly. Some women find yoga and walking to be beneficial.  Eat a balanced and nourishing diet.  Do little things that you enjoy. Have a cup of tea, take a bubble bath, read your favorite magazine, or listen to your favorite music.  Avoid alcohol.  Ask for help with household chores, cooking, grocery shopping, or running errands as needed. Do not try to do everything.  Talk to people close to you about how you are feeling. Get support from your partner, family members, friends, or other new moms.  Try to stay positive in how you think. Think about the things you are grateful for.  Do not spend a lot of time alone.  Only take medicine as directed by your caregiver.  Keep all your postpartum appointments.  Let your caregiver know if you have any concerns. SEEK MEDICAL CARE IF: You are having a reaction or problems with your medicine. SEEK IMMEDIATE MEDICAL CARE IF:  You have suicidal feelings.  You feel you may harm the baby or someone else. Document Released: 09/20/2004 Document Revised: 03/10/2012 Document Reviewed: 10/23/2011 [facility] Patient Information 2014 [place], [place].     Breastfeeding Deciding to breastfeed is one of the best choices you can make for you and your baby. A change in hormones during pregnancy causes your breast tissue to grow and increases the number and size of your milk ducts.  These hormones also allow proteins, sugars, and fats from your blood supply to make breast milk in your milk-producing glands. Hormones prevent breast milk from being released before your baby is born as well as prompt milk flow after birth. Once breastfeeding has begun, thoughts of your baby, as well as his or her sucking or crying, can stimulate the release of milk from your milk-producing glands.  BENEFITS OF BREASTFEEDING For Your Baby  Your first milk (  colostrum) helps your baby's digestive system function better.   There are antibodies in your milk that help your baby fight off infections.   Your baby has a lower incidence of asthma, allergies, and sudden infant death syndrome.   The nutrients in breast milk are better for your baby than infant formulas and are designed uniquely for your baby's needs.   Breast milk improves your baby's brain development.   Your baby is less likely to develop other conditions, such as childhood obesity, asthma, or type 2 diabetes mellitus.  For You   Breastfeeding helps to create a very special bond between you and your baby.   Breastfeeding is convenient. Breast milk is always available at the correct temperature and costs nothing.   Breastfeeding helps to burn calories and helps you lose the weight gained during pregnancy.   Breastfeeding makes your uterus contract to its prepregnancy size faster and slows bleeding (lochia) after you give birth.   Breastfeeding helps to lower your risk of developing type 2 diabetes mellitus, osteoporosis, and breast or ovarian cancer later in life. SIGNS THAT YOUR BABY IS HUNGRY Early Signs of Hunger  Increased alertness or activity.  Stretching.  Movement of the head from side to side.  Movement of the head and opening of the mouth when the corner of the mouth or cheek is stroked (rooting).  Increased sucking sounds, smacking lips, cooing, sighing, or squeaking.  Hand-to-mouth  movements.  Increased sucking of fingers or hands. Late Signs of Hunger  Fussing.  Intermittent crying. Extreme Signs of Hunger Signs of extreme hunger will require calming and consoling before your baby will be able to breastfeed successfully. Do not wait for the following signs of extreme hunger to occur before you initiate breastfeeding:   Restlessness.  A loud, strong cry.   Screaming.   BREASTFEEDING BASICS Breastfeeding Initiation  Find a comfortable place to sit or lie down, with your neck and back well supported.  Place a pillow or rolled up blanket under your baby to bring him or her to the level of your breast (if you are seated). Nursing pillows are specially designed to help support your arms and your baby while you breastfeed.  Make sure that your baby's abdomen is facing your abdomen.   Gently massage your breast. With your fingertips, massage from your chest wall toward your nipple in a circular motion. This encourages milk flow. You may need to continue this action during the feeding if your milk flows slowly.  Support your breast with 4 fingers underneath and your thumb above your nipple. Make sure your fingers are well away from your nipple and your baby's mouth.   Stroke your baby's lips gently with your finger or nipple.   When your baby's mouth is open wide enough, quickly bring your baby to your breast, placing your entire nipple and as much of the colored area around your nipple (areola) as possible into your baby's mouth.   More areola should be visible above your baby's upper lip than below the lower lip.   Your baby's tongue should be between his or her lower gum and your breast.   Ensure that your baby's mouth is correctly positioned around your nipple (latched). Your baby's lips should create a seal on your breast and be turned out (everted).  It is common for your baby to suck about 2-3 minutes in order to start the flow of breast  milk. Latching Teaching your baby how to latch on to your  breast properly is very important. An improper latch can cause nipple pain and decreased milk supply for you and poor weight gain in your baby. Also, if your baby is not latched onto your nipple properly, he or she may swallow some air during feeding. This can make your baby fussy. Burping your baby when you switch breasts during the feeding can help to get rid of the air. However, teaching your baby to latch on properly is still the best way to prevent fussiness from swallowing air while breastfeeding. Signs that your baby has successfully latched on to your nipple:    Silent tugging or silent sucking, without causing you pain.   Swallowing heard between every 3-4 sucks.    Muscle movement above and in front of his or her ears while sucking.  Signs that your baby has not successfully latched on to nipple:   Sucking sounds or smacking sounds from your baby while breastfeeding.  Nipple pain. If you think your baby has not latched on correctly, slip your finger into the corner of your baby's mouth to break the suction and place it between your baby's gums. Attempt breastfeeding initiation again. Signs of Successful Breastfeeding Signs from your baby:   A gradual decrease in the number of sucks or complete cessation of sucking.   Falling asleep.   Relaxation of his or her body.   Retention of a small amount of milk in his or her mouth.   Letting go of your breast by himself or herself. Signs from you:  Breasts that have increased in firmness, weight, and size 1-3 hours after feeding.   Breasts that are softer immediately after breastfeeding.  Increased milk volume, as well as a change in milk consistency and color by the fifth day of breastfeeding.   Nipples that are not sore, cracked, or bleeding. Signs That Your Pecola Leisure is Getting Enough Milk  Wetting at least 3 diapers in a 24-hour period. The urine should be  clear and pale yellow by age 63 days.  At least 3 stools in a 24-hour period by age 63 days. The stool should be soft and yellow.  At least 3 stools in a 24-hour period by age 108 days. The stool should be seedy and yellow.  No loss of weight greater than 10% of birth weight during the first 15 days of age.  Average weight gain of 4-7 ounces (113-198 g) per week after age 41 days.  Consistent daily weight gain by age 63 days, without weight loss after the age of 2 weeks. After a feeding, your baby may spit up a small amount. This is common. BREASTFEEDING FREQUENCY AND DURATION Frequent feeding will help you make more milk and can prevent sore nipples and breast engorgement. Breastfeed when you feel the need to reduce the fullness of your breasts or when your baby shows signs of hunger. This is called "breastfeeding on demand." Avoid introducing a pacifier to your baby while you are working to establish breastfeeding (the first 4-6 weeks after your baby is born). After this time you may choose to use a pacifier. Research has shown that pacifier use during the first year of a baby's life decreases the risk of sudden infant death syndrome (SIDS). Allow your baby to feed on each breast as long as he or she wants. Breastfeed until your baby is finished feeding. When your baby unlatches or falls asleep while feeding from the first breast, offer the second breast. Because newborns are often sleepy in the  first few weeks of life, you may need to awaken your baby to get him or her to feed. Breastfeeding times will vary from baby to baby. However, the following rules can serve as a guide to help you ensure that your baby is properly fed:  Newborns (babies 58 weeks of age or younger) may breastfeed every 1-3 hours.  Newborns should not go longer than 3 hours during the day or 5 hours during the night without breastfeeding.  You should breastfeed your baby a minimum of 8 times in a 24-hour period until you begin to  introduce solid foods to your baby at around 80 months of age. BREAST MILK PUMPING Pumping and storing breast milk allows you to ensure that your baby is exclusively fed your breast milk, even at times when you are unable to breastfeed. This is especially important if you are going back to work while you are still breastfeeding or when you are not able to be present during feedings. Your lactation consultant can give you guidelines on how long it is safe to store breast milk.  A breast pump is a machine that allows you to pump milk from your breast into a sterile bottle. The pumped breast milk can then be stored in a refrigerator or freezer. Some breast pumps are operated by hand, while others use electricity. Ask your lactation consultant which type will work best for you. Breast pumps can be purchased, but some hospitals and breastfeeding support groups lease breast pumps on a monthly basis. A lactation consultant can teach you how to hand express breast milk, if you prefer not to use a pump.  CARING FOR YOUR BREASTS WHILE YOU BREASTFEED Nipples can become dry, cracked, and sore while breastfeeding. The following recommendations can help keep your breasts moisturized and healthy:  Avoid using soap on your nipples.   Wear a supportive bra. Although not required, special nursing bras and tank tops are designed to allow access to your breasts for breastfeeding without taking off your entire bra or top. Avoid wearing underwire-style bras or extremely tight bras.  Air dry your nipples for 3-52minutes after each feeding.   Use only cotton bra pads to absorb leaked breast milk. Leaking of breast milk between feedings is normal.   Use lanolin on your nipples after breastfeeding. Lanolin helps to maintain your skin's normal moisture barrier. If you use pure lanolin, you do not need to wash it off before feeding your baby again. Pure lanolin is not toxic to your baby. You may also hand express a few drops  of breast milk and gently massage that milk into your nipples and allow the milk to air dry. In the first few weeks after giving birth, some women experience extremely full breasts (engorgement). Engorgement can make your breasts feel heavy, warm, and tender to the touch. Engorgement peaks within 3-5 days after you give birth. The following recommendations can help ease engorgement:  Completely empty your breasts while breastfeeding or pumping. You may want to start by applying warm, moist heat (in the shower or with warm water-soaked hand towels) just before feeding or pumping. This increases circulation and helps the milk flow. If your baby does not completely empty your breasts while breastfeeding, pump any extra milk after he or she is finished.  Wear a snug bra (nursing or regular) or tank top for 1-2 days to signal your body to slightly decrease milk production.  Apply ice packs to your breasts, unless this is too uncomfortable for you.  Make sure that your baby is latched on and positioned properly while breastfeeding. If engorgement persists after 48 hours of following these recommendations, contact your health care provider or a Advertising copywriterlactation consultant. OVERALL HEALTH CARE RECOMMENDATIONS WHILE BREASTFEEDING  Eat healthy foods. Alternate between meals and snacks, eating 3 of each per day. Because what you eat affects your breast milk, some of the foods may make your baby more irritable than usual. Avoid eating these foods if you are sure that they are negatively affecting your baby.  Drink milk, fruit juice, and water to satisfy your thirst (about 10 glasses a day).   Rest often, relax, and continue to take your prenatal vitamins to prevent fatigue, stress, and anemia.  Continue breast self-awareness checks.  Avoid chewing and smoking tobacco.  Avoid alcohol and drug use. Some medicines that may be harmful to your baby can pass through breast milk. It is important to ask your health  care provider before taking any medicine, including all over-the-counter and prescription medicine as well as vitamin and herbal supplements. It is possible to become pregnant while breastfeeding. If birth control is desired, ask your health care provider about options that will be safe for your baby. SEEK MEDICAL CARE IF:   You feel like you want to stop breastfeeding or have become frustrated with breastfeeding.  You have painful breasts or nipples.  Your nipples are cracked or bleeding.  Your breasts are red, tender, or warm.  You have a swollen area on either breast.  You have a fever or chills.  You have nausea or vomiting.  You have drainage other than breast milk from your nipples.  Your breasts do not become full before feedings by the fifth day after you give birth.  You feel sad and depressed.  Your baby is too sleepy to eat well.  Your baby is having trouble sleeping.   Your baby is wetting less than 3 diapers in a 24-hour period.  Your baby has less than 3 stools in a 24-hour period.  Your baby's skin or the white part of his or her eyes becomes yellow.   Your baby is not gaining weight by 335 days of age. SEEK IMMEDIATE MEDICAL CARE IF:   Your baby is overly tired (lethargic) and does not want to wake up and feed.  Your baby develops an unexplained fever. Document Released: 12/17/2005 Document Revised: 12/22/2013 Document Reviewed: 06/10/2013 [facility] Patient Information 2015 [employer] [place]. This information is not intended to replace advice given to you by your health care provider. Make sure you discuss any questions you have with your health care provider.

## 2014-11-30 NOTE — Lactation Note (Signed)
This note was copied from the chart of Melissa [name]. Lactation Consultation Note  Patient Name: Melissa [name] [id number] Date: 11/30/2014 Initial feeding assessment - per mom baby recently fed and mom knows to call [facility] for feeding assessment  Baby is 19 hours old ,and has been to the breast recently and several times in her life. Still has not voided. LC discussed basics with mom and recommended for next feeding to call for a feeding assessment with LC,  Also prior to latching 1st breast , breast massage, hand express, to enhance the flow. Baby resting on mom chest , unable to  Show mom how to hand express at this time , described hand expressing technique. LC showed mom how to use the hand pump, Will need to check flange , with feeding assessment. Mom receptive to teaching.  Mother informed of post-discharge support and given phone number to the lactation department, including services for phone call assistance; out-patient appointments; and breastfeeding support group. List of other breastfeeding resources in the community given in the handout. Encouraged mother to call for problems or concerns related to breastfeeding.   Maternal Data Has patient been taught Hand Expression?:  (explained to mom technique, baby sound asleep on moms breast )  Feeding Feeding Type:  (per mom recently fed at the breast , see doc flow sheets ) Length of feed: 10 min  LATCH Score/Interventions                      Lactation Tools Discussed/Used Tools: Pump Breast pump type: Manual WIC Program: Yes Pump Review: Setup, frequency, and cleaning Initiated by:: MAI  Date initiated:: 11/30/14   Consult Status Consult Status: Follow-up Date: 11/30/14 Follow-up type: In-patient    Kathrin Greathouse 11/30/2014, 2:20 PM

## 2014-11-30 NOTE — Lactation Note (Signed)
This note was copied from the chart of Melissa [name]. Lactation Consultation Note  Patient Name: Melissa [name] Date: 11/30/2014 Reason for consult: Follow-up assessment  Mom asking for early D/C , LC called back into the room with feeding cues. Mom had latched the baby without pillows and baby  Was noted to be latched shallow. LC had mom release suction and LC added pillow support , and reviewed basics and the importance of depth at the breast. LC worked with mom to challenge baby to open wider by tickling upper lip and wait for a wide open mouth and then latch with breast compressions until the baby is in a swallowing pattern. Also to use intermittent breast compression with latch to keep the baby in a good consistent pattern with swallows. Baby noted to take several tries to latch and once depth obtained would stay latched And noted a few swallows, and get sleepy while being latched. @ this consult tried 3 different latches, and noted the same feeding behavior. LC recommended to mom to call for another feeding assessment when the baby is more awake and showing stronger feeding cues. LC checked flange size of hand pump , #24 Flange a good fit for today , and #27 Flange given mom for when the milk comes  In. Baby did get better opening mouth with latch the 3rd latch , and lips noted to be flanged, and mouth wider.    Maternal Data Has patient been taught Hand Expression?:  (explained to mom technique, baby sound asleep on moms breast ) Does the patient have breastfeeding experience prior to this delivery?: Yes  Feeding Feeding Type: Breast Fed Length of feed:  (latdhed sluggish pattern, with few swallows )  LATCH Score/Interventions Latch: Repeated attempts needed to sustain latch, nipple held in mouth throughout feeding, stimulation needed to elicit sucking reflex. Intervention(s): Adjust position;Assist with latch;Breast massage;Breast compression  Audible Swallowing: A few  with stimulation  Type of Nipple: Everted at rest and after stimulation  Comfort (Breast/Nipple): Soft / non-tender     Hold (Positioning): Assistance needed to correctly position infant at breast and maintain latch. (depth improved ) Intervention(s): Breastfeeding basics reviewed;Support Pillows;Position options;Skin to skin  LATCH Score: 7  Lactation Tools Discussed/Used Tools: Shells;Pump;Flanges Flange Size: 27 Shell Type: Inverted Breast pump type: Manual WIC Program: Yes Pump Review: Setup, frequency, and cleaning Initiated by:: MAI  Date initiated:: 11/30/14   Consult Status Consult Status: Follow-up Date: 12/01/14 Follow-up type: In-patient    Kathrin Greathouse 11/30/2014, 4:08 PM

## 2014-11-30 NOTE — Progress Notes (Signed)
UR chart review completed.  

## 2014-11-30 NOTE — Plan of Care (Signed)
Problem: Phase I Progression Outcomes Goal: Pain controlled with appropriate interventions Outcome: Completed/Met Date Met:  11/30/14 Goal: OOB as tolerated unless otherwise ordered Outcome: Completed/Met Date Met:  11/30/14 Goal: IS, TCDB as ordered Outcome: Not Applicable Date Met:  31/59/45 Goal: VS, stable, temp < 100.4 degrees F Outcome: Completed/Met Date Met:  11/30/14 Goal: Initial discharge plan identified Outcome: Completed/Met Date Met:  11/30/14 Goal: Other Phase I Outcomes/Goals Outcome: Not Applicable Date Met:  85/92/92  Problem: Phase II Progression Outcomes Goal: Pain controlled on oral analgesia Outcome: Completed/Met Date Met:  11/30/14 Goal: Progress activity as tolerated unless otherwise ordered Outcome: Completed/Met Date Met:  11/30/14 Goal: Afebrile, VS remain stable Outcome: Completed/Met Date Met:  11/30/14 Goal: Incision intact & without signs/symptoms of infection Outcome: Not Applicable Date Met:  44/62/86 Goal: Rh isoimmunization per orders Outcome: Not Applicable Date Met:  38/17/71 Goal: Tolerating diet Outcome: Completed/Met Date Met:  11/30/14 Goal: Other Phase II Outcomes/Goals Outcome: Not Applicable Date Met:  16/57/90

## 2014-11-30 NOTE — Progress Notes (Signed)
Clinical Social Work Department PSYCHOSOCIAL ASSESSMENT - MATERNAL/CHILD 11/30/2014  Patient:  [name],[name]  Account Number:  [id number]  Admit Date:  11/29/2014  Childs Name:   Melissa [name]   Clinical Social Worker:  Kainalu Heggs, CLINICAL SOCIAL WORKER   Date/Time:  11/30/2014 09:50 AM  Date Referred:  11/29/2014   Referral source  Central Nursery     Referred reason  LPNC   Other referral source:    I:  FAMILY / HOME ENVIRONMENT Child's legal guardian:  PARENT  Guardian - Name Guardian - Age Guardian - Address  Melissa [name] 28 [address] [place], [place] [address]  Melissa [name]  different residence   Other household support members/support persons Name Relationship DOB   SON 10/14   DAUGHTER 2010   Other support:    II  PSYCHOSOCIAL DATA Information Source:  Patient Interview  Financial and Community Resources Employment:   MOB stated that she is employed at [employer] and received a promotion shortly after she learned that she was pregnant.   Financial resources:  Medicaid If Medicaid - County:  [place] Other  Food Stamps  WIC   School / Grade:  N/A Maternity Care Coordinator / Child Services Coordination / Early Interventions:   None reported  Cultural issues impacting care:   None reported    III  STRENGTHS Strengths  Adequate Resources  Home prepared for Child (including basic supplies)  Supportive family/friends   Strength comment:    IV  RISK FACTORS AND CURRENT PROBLEMS Current Problem:  YES   Risk Factor & Current Problem Patient Issue Family Issue Risk Factor / Current Problem Comment  Other - See comment Y N MOB arrived late to prenatal care.  She initiated care at 29 weeks.  The baby's UDS and MDS are pending, MOB denied all substance use.    V  SOCIAL WORK ASSESSMENT CSW met with the MOB due to receiving late prenatal care.  MOB was easily engaged and was receptive to the visit.  She displayed a full range in affect, presented in  a pleasant mood, and openly discussed her psychosocial stressors, history of postpartum medical conditions, and reason for late prenatal care.  MOB expressed appreciation for the visit and acknowledged ongoing CSW availability while at the hospital.    CSW provided supportive listening and guided the MOB process her feelings secondary to the transition into the postpartum period.  MOB openly discussed how she is feeling as she transitions into the postpartum period.  She smiled and stated that she loves her infant, but discussed realistic expectation of stressors that await her since her other children are 5 years old and 13 months old.  She shared that she has a supportive relationship with the FOB, but discussed that since he lives in [place] and recently started a new job, his level of involvement is often lower than they would prefer.  She shared belief that she will be able to adjust to having a third child since her oldest is in school and she does receive extra support from the MGM, with whom she lives with.  She continued to process her current relationship with the MGM.  She discussed that while she is supportive, she also has a history of strenuous relationship with her.  She discussed her goals of securing her own home in upcoming months since she wants to be on her own and does not want her children exposed to the strained relationship.  The MOB shared that the   MGM is a "war vet", and discussed potential untreated mental health symptoms.  The MOB also discussed recent job promotion.  She shared that she learned of the job promotion shortly after she learned that she was pregnant. She expressed gratitude for her co-workers since they have been supportive of her throughout the pregnancy.    The MOB continued to process her past experiences with medical complications in the postpartum period.  She stated that the FOB was concerned about her when she first shared that she was pregnant given her history  of postpartum complications; however, she stated that she has never been worried.  She discussed her thought process that assists her to reduce anxiety, which included her believing that "God has a plan" and "what happens, happens".  The MOB shared that she is only able to take her medications, and the "rest is up to God".  MOB endorsed strong medical team that has assisted her throughout the pregnancy to ensure her health.  She shared that she is motivated to continue her treatment and to take her medications for her children. She discussed belief that she needs to maintain her health for her children, since she wants to be present and the one to raise them.  MOB shared that she does not believe that there are any other options besides taking her medication, and presents with insight about negative consequences if she does not take her medications.  She did acknowledge that there are some days when she feels overwhelmed and has limited motivation to get out of bed and take her medications, but stated that the period is always brief since she is motivated by her children to continue even when "it gets tough".   MOB struggled to identify daily self-care practices and coping skills as she reflected upon having limited time for herself.  She did not present with any regret for limited self-care since she believes it is her current job to be a mother and provide for them first.  The MOB presented as highly motivated to continue her current medical treatment.   As the assessment unfolded, MOB discussed without prompting reason for late prenatal care.  She stated that she did not know that she was pregnant until she was 29 weeks.  She shared that she continued to receive her menstrual cycle and did not feel the baby "move".  The MOB processed the surprise she felt when she finally learned that she was pregnant. She shared that she has since prepared for the baby's arrival given the support from her family, friends, and  coworkers.  MOB verbalized understanding of hospital drug screen policy given late prenatal care.  MOB denied any substance use and expressed confidence that the UDS and MDS will be negative.   No barriers to discharge.     VI SOCIAL WORK PLAN Social Work Plan  Patient/Family Education  No Further Intervention Required / No Barriers to Discharge   Type of pt/family education:   Postpartum depression  Hospital drug screen policy   If child protective services report - county:   If child protective services report - date:   Information/referral to community resources comment:   No referrals needed at this time.   Other social work plan:   CSW to follow up PRN.  CSW to monitor UDS and MDS and will make CPS report if needed.     

## 2014-11-30 NOTE — Anesthesia Postprocedure Evaluation (Signed)
  Anesthesia Post-op Note  Patient: Melissa [name]  Procedure(s) Performed: * No procedures listed *  Patient Location: Mother/Baby  Anesthesia Type:Epidural  Level of Consciousness: awake, alert  and oriented  Airway and Oxygen Therapy: Patient Spontanous Breathing  Post-op Pain: none  Post-op Assessment: Post-op Vital signs reviewed and Patient's Cardiovascular Status Stable  Post-op Vital Signs: Reviewed and stable  Last Vitals:  Filed Vitals:   11/30/14 0219  BP: 122/73  Pulse: 64  Temp: 36.8 C  Resp: 18    Complications: No apparent anesthesia complications

## 2014-12-01 MED ORDER — FERROUS SULFATE 325 (65 FE) MG PO TABS
325.0000 mg | ORAL_TABLET | Freq: Two times a day (BID) | ORAL | Status: DC
  Administered 2014-12-01: 325 mg via ORAL
  Filled 2014-12-01: qty 1

## 2014-12-01 MED ORDER — FERROUS SULFATE 325 (65 FE) MG PO TABS: 325.0000 mg | ORAL_TABLET | Freq: Two times a day (BID) | ORAL | Status: DC

## 2014-12-01 MED ORDER — IBUPROFEN 600 MG PO TABS: 600.0000 mg | ORAL_TABLET | Freq: Four times a day (QID) | ORAL | Status: DC

## 2014-12-01 MED ORDER — OXYCODONE-ACETAMINOPHEN 5-325 MG PO TABS: 1.0000 | ORAL_TABLET | ORAL | Status: DC | PRN

## 2014-12-01 MED ORDER — MEDROXYPROGESTERONE ACETATE 150 MG/ML IM SUSP
150.0000 mg | Freq: Once | INTRAMUSCULAR | Status: AC
  Administered 2014-12-01: 150 mg via INTRAMUSCULAR
  Filled 2014-12-01: qty 1

## 2014-12-01 NOTE — Lactation Note (Signed)
This note was copied from the chart of Melissa Valeska Jakubowicz. Lactation Consultation Note  No stool since 12/1 1125.  Provide LC phone and suggest mother call to view next feeding. Baby breastfed at 0900 for 25 min. Mother states she has some nipple tenderness.  Provided comfort gels. Mom encouraged to feed baby 8-12 times/24 hours and with feeding cues.  Reviewed engorgement care.    Patient Name: Melissa [name] [id number] Date: 12/01/2014 Reason for consult: Follow-up assessment   Maternal Data    Feeding Feeding Type: Breast Fed Length of feed: 25 min  LATCH Score/Interventions                      Lactation Tools Discussed/Used     Consult Status Consult Status: Complete    Hardie Pulley 12/01/2014, 9:47 AM

## 2014-12-01 NOTE — Plan of Care (Signed)
Problem: Consults Goal: Postpartum Patient Education (See Patient Education module for education specifics.)  Outcome: Completed/Met Date Met:  12/01/14  Problem: Discharge Progression Outcomes Goal: Barriers To Progression Addressed/Resolved Outcome: Completed/Met Date Met:  12/01/14 Goal: Activity appropriate for discharge plan Outcome: Completed/Met Date Met:  12/01/14 Goal: Tolerating diet Outcome: Completed/Met Date Met:  19/59/74 Goal: Complications resolved/controlled Outcome: Not Applicable Date Met:  71/85/50 Goal: Pain controlled with appropriate interventions Outcome: Completed/Met Date Met:  12/01/14 Goal: Afebrile, VS remain stable at discharge Outcome: Completed/Met Date Met:  12/01/14 Goal: Remove staples per MD order Outcome: Not Applicable Date Met:  15/86/82 Goal: MMR given as ordered Outcome: Not Applicable Date Met:  57/49/35 Goal: Discharge plan in place and appropriate Outcome: Completed/Met Date Met:  12/01/14 Goal: Other Discharge Outcomes/Goals Outcome: Not Applicable Date Met:  52/17/47

## 2015-07-29 ENCOUNTER — Emergency Department (HOSPITAL_COMMUNITY)
Admission: EM | Admit: 2015-07-29 | Discharge: 2015-07-29 | Disposition: A | Discharge status: Home / Self Care | Payer: Managed Care, Other (non HMO) | Attending: Emergency Medicine | Admitting: Emergency Medicine

## 2015-07-29 ENCOUNTER — Emergency Department (HOSPITAL_COMMUNITY): Payer: Managed Care, Other (non HMO)

## 2015-07-29 ENCOUNTER — Encounter (HOSPITAL_COMMUNITY): Payer: Self-pay | Admitting: Emergency Medicine

## 2015-07-29 DIAGNOSIS — Z8619 Personal history of other infectious and parasitic diseases: Secondary | ICD-10-CM | POA: Insufficient documentation

## 2015-07-29 DIAGNOSIS — Z8673 Personal history of transient ischemic attack (TIA), and cerebral infarction without residual deficits: Secondary | ICD-10-CM | POA: Insufficient documentation

## 2015-07-29 DIAGNOSIS — R202 Paresthesia of skin: Secondary | ICD-10-CM | POA: Insufficient documentation

## 2015-07-29 DIAGNOSIS — R519 Headache, unspecified: Secondary | ICD-10-CM

## 2015-07-29 DIAGNOSIS — Z79899 Other long term (current) drug therapy: Secondary | ICD-10-CM | POA: Insufficient documentation

## 2015-07-29 DIAGNOSIS — R51 Headache: Secondary | ICD-10-CM | POA: Diagnosis present

## 2015-07-29 DIAGNOSIS — I252 Old myocardial infarction: Secondary | ICD-10-CM | POA: Diagnosis not present

## 2015-07-29 DIAGNOSIS — J45909 Unspecified asthma, uncomplicated: Secondary | ICD-10-CM | POA: Diagnosis not present

## 2015-07-29 DIAGNOSIS — D649 Anemia, unspecified: Secondary | ICD-10-CM | POA: Diagnosis not present

## 2015-07-29 LAB — BASIC METABOLIC PANEL
Anion gap: 10 (ref 5–15)
BUN: 9 mg/dL (ref 6–20)
CALCIUM: 9.3 mg/dL (ref 8.9–10.3)
CHLORIDE: 107 mmol/L (ref 101–111)
CO2: 22 mmol/L (ref 22–32)
Creatinine, Ser: 0.77 mg/dL (ref 0.44–1.00)
Glucose, Bld: 97 mg/dL (ref 65–99)
Potassium: 3.8 mmol/L (ref 3.5–5.1)
Sodium: 139 mmol/L (ref 135–145)

## 2015-07-29 LAB — URINE MICROSCOPIC-ADD ON

## 2015-07-29 LAB — CBC
HEMATOCRIT: 39.7 % (ref 36.0–46.0)
Hemoglobin: 13.2 g/dL (ref 12.0–15.0)
MCH: 28.9 pg (ref 26.0–34.0)
MCHC: 33.2 g/dL (ref 30.0–36.0)
MCV: 87.1 fL (ref 78.0–100.0)
PLATELETS: 213 10*3/uL (ref 150–400)
RBC: 4.56 MIL/uL (ref 3.87–5.11)
RDW: 13.9 % (ref 11.5–15.5)
WBC: 6.6 10*3/uL (ref 4.0–10.5)

## 2015-07-29 LAB — URINALYSIS, ROUTINE W REFLEX MICROSCOPIC
BILIRUBIN URINE: NEGATIVE
Glucose, UA: NEGATIVE mg/dL
KETONES UR: NEGATIVE mg/dL
Nitrite: NEGATIVE
PROTEIN: NEGATIVE mg/dL
Specific Gravity, Urine: 1.028 (ref 1.005–1.030)
Urobilinogen, UA: 0.2 mg/dL (ref 0.0–1.0)
pH: 5.5 (ref 5.0–8.0)

## 2015-07-29 LAB — I-STAT BETA HCG BLOOD, ED (MC, WL, AP ONLY): I-stat hCG, quantitative: <5 m[IU]/mL (ref <5)

## 2015-07-29 MED ORDER — DEXAMETHASONE SODIUM PHOSPHATE 10 MG/ML IJ SOLN
10.0000 mg | Freq: Once | INTRAMUSCULAR | Status: AC
  Administered 2015-07-29: 10 mg via INTRAVENOUS
  Filled 2015-07-29: qty 1

## 2015-07-29 MED ORDER — SODIUM CHLORIDE 0.9 % IV BOLUS (SEPSIS)
1000.0000 mL | Freq: Once | INTRAVENOUS | Status: AC
  Administered 2015-07-29: 1000 mL via INTRAVENOUS

## 2015-07-29 MED ORDER — KETOROLAC TROMETHAMINE 30 MG/ML IJ SOLN
30.0000 mg | Freq: Once | INTRAMUSCULAR | Status: AC
  Administered 2015-07-29: 30 mg via INTRAVENOUS
  Filled 2015-07-29: qty 1

## 2015-07-29 MED ORDER — METOCLOPRAMIDE HCL 5 MG/ML IJ SOLN
10.0000 mg | INTRAMUSCULAR | Status: AC
  Administered 2015-07-29: 10 mg via INTRAVENOUS
  Filled 2015-07-29: qty 2

## 2015-07-29 MED ORDER — DIPHENHYDRAMINE HCL 50 MG/ML IJ SOLN
25.0000 mg | Freq: Once | INTRAMUSCULAR | Status: AC
  Administered 2015-07-29: 25 mg via INTRAVENOUS
  Filled 2015-07-29: qty 1

## 2015-07-29 MED ORDER — PROCHLORPERAZINE EDISYLATE 5 MG/ML IJ SOLN
10.0000 mg | Freq: Once | INTRAMUSCULAR | Status: AC
  Administered 2015-07-29: 10 mg via INTRAVENOUS
  Filled 2015-07-29: qty 2

## 2015-07-29 NOTE — ED Provider Notes (Signed)
CSN: [id number]     Arrival date & time 07/29/15  1142 History   First MD Initiated Contact with Patient 07/29/15 1447     Chief Complaint  Patient presents with  . Headache  . Tingling     (Consider location/radiation/quality/duration/timing/severity/associated sxs/prior Treatment) The history is provided by the patient and medical records.    This is a 29 year old female with history of anemia, asthma, chronic nerve pain, history of stroke in 2013 and 2015, presenting to the ED for headache. Patient states headache has been constant for the past week. She describes it as generalized throughout her entire head, throbbing in nature. She does report some photophobia.  She states around Monday afternoon (24 hours after headache began) she developed some twitching in the left side of her face and soon after developed paresthesias in the left side of her arm. She equates this to feeling as if "her arm is asleep". She also reports that she feels her responses and thought processes are slower than normal, but denies frank confusion. She denies any numbness or weakness of her extremities. No gait disturbance. No facial droop. Patient is followed by [facility] neurology. She takes aspirin daily for stroke prevention.  VSS.  Past Medical History  Diagnosis Date  . Placenta previa antepartum 2010    Placenta moved 3 days before baby was born  . Abnormal Pap smear 2010    Repeat pap done;Hysteroscopy;Last pap 2012;was normal  . Infection     BV;may get frequently  . Anemia 2010    Iron supplements PP  . Asthma     Has a nebulizer;triggered by stress  . Stroke 2013    MD thought may have had a stroke and heart attack;has neurologist @ [facility]  . Heart attack     MD thinks may have had a heart attack;Cardiologist @ [facility]  . Nerve pain     Chronic;d/t possible stroke and MI  . Sickle cell trait   . Headache(784.0)   . History of CVA (cerebrovascular accident) 04/09/2013    Pt reports  presumptive "stroke" when she was 29y.o.; followed by [facility] neurology for "chronic nerve pain" and has been Rx'd Hydrocodone and Mobic   Past Surgical History  Procedure Laterality Date  . Hysteroscopy  2010   Family History  Problem Relation Age of Onset  . Sickle cell trait Father   . Hypertension Mother   . Anemia Mother   . Asthma Mother   . Cervical cancer Paternal Aunt   . Cervical cancer Paternal Grandmother   . Diabetes Paternal Grandmother   . Diabetes Paternal Grandfather   . Diabetes Paternal Aunt   . Asthma Brother   . Asthma Daughter   . Asthma Son    History  Substance Use Topics  . Smoking status: Never Smoker   . Smokeless tobacco: Never Used  . Alcohol Use: Yes     Comment: socially;prior to pregnancy   OB History    Gravida Para Term Preterm AB TAB SAB Ectopic Multiple Living   0 3     Review of Systems  Neurological: Positive for headaches.  All other systems reviewed and are negative.     Allergies  Ginger  Home Medications   Prior to Admission medications   Medication Sig Start Date End Date Taking? Authorizing Provider  albuterol (PROVENTIL HFA;VENTOLIN HFA) 108 (90 BASE) MCG/ACT inhaler Inhale 2 puffs into the [id number] 4 (four) hours as needed  for wheezing or shortness of breath.    Historical Provider, MD  albuterol (PROVENTIL) (2.5 MG/3ML) 0.083% nebulizer solution Take 2.5 mg by nebulization every 4 (four) hours as needed for wheezing or shortness of breath.    Historical Provider, MD  ferrous sulfate 325 (65 FE) MG tablet Take 1 tablet (325 mg total) by mouth 2 (two) times daily with a meal. 12/01/14   Venus Standard, CNM  ibuprofen (ADVIL,MOTRIN) 600 MG tablet Take 1 tablet (600 mg total) by mouth every 6 (six) hours. 12/01/14   Venus Standard, CNM  oxyCODONE-acetaminophen (PERCOCET/ROXICET) 5-325 MG per tablet Take 1 tablet by mouth every 4 (four) hours as needed (for pain scale less than 7). 12/01/14   Venus Standard, CNM   Prenatal Vit-Fe Fumarate-FA (PRENATAL MULTIVITAMIN) TABS tablet Take 1 tablet by mouth daily.     Historical Provider, MD   BP 100/72 mmHg  Pulse 76  Temp(Src) 98.4 F (36.9 C) (Oral)  Resp 19  Ht 5\' 5"  (1.651 m)  Wt 196 lb (88.905 kg)  BMI 32.62 kg/m2  SpO2 100%   Physical Exam  Constitutional: She is oriented to person, place, and time. She appears well-developed and well-nourished. No distress.  HENT:  Head: Normocephalic and atraumatic.  Mouth/Throat: Oropharynx is clear and moist.  Eyes: Conjunctivae and EOM are normal. Pupils are equal, round, and reactive to light.  Neck: Normal range of motion and full passive range of motion without pain. Neck supple. No rigidity.  Full range of motion, no meningismus  Cardiovascular: Normal rate, regular rhythm and normal heart sounds.   Pulmonary/Chest: Effort normal and breath sounds normal. No respiratory distress. She has no wheezes.  Abdominal: Soft. Bowel sounds are normal. There is no tenderness. There is no guarding.  Musculoskeletal: Normal range of motion. She exhibits no edema.  Neurological: She is alert and oriented to person, place, and time.  [name] KitchenAAOx3, answering questions and following commands appropriately; equal strength UE and LE bilaterally; altered sensation of left side of face and left arm when compared with right, normal senstiaon of bilateral legs; moves all extremities appropriately without ataxia; normal coordination, normal speech, no facial asymmetry, symmetric forehead wrinkle  Skin: Skin is warm and dry. She is not diaphoretic.  Psychiatric: She has a normal mood and affect. Her speech is not slurred.  Nursing note and vitals reviewed.   ED Course  Procedures (including critical care time) Labs Review Labs Reviewed  URINALYSIS, ROUTINE W REFLEX MICROSCOPIC (NOT AT [facility]) - Abnormal; Notable for the following:    APPearance CLOUDY (*)    Hgb urine dipstick SMALL (*)    Leukocytes, UA MODERATE (*)    All  other components within normal limits  URINE MICROSCOPIC-ADD ON - Abnormal; Notable for the following:    Squamous Epithelial / LPF FEW (*)    Bacteria, UA MANY (*)    All other components within normal limits  URINE CULTURE  BASIC METABOLIC PANEL  CBC  I-STAT BETA HCG BLOOD, ED ([facility], [facility], [facility] ONLY)    Imaging Review Ct Head Wo Contrast  07/29/2015   CLINICAL DATA:  Frontal headache for 1 week.  EXAM: CT HEAD WITHOUT CONTRAST  TECHNIQUE: Contiguous axial images were obtained from the base of the skull through the vertex without intravenous contrast.  COMPARISON:  CT scan of October 29, 2013.  FINDINGS: Bony calvarium appears intact. No mass effect or midline shift is noted. Ventricular size is within normal limits. There is no evidence of mass lesion, hemorrhage  or acute infarction.  IMPRESSION: Normal head CT.   Electronically Signed   By: Lupita Raider, M.D.   On: 07/29/2015 16:24     EKG Interpretation None      MDM   Final diagnoses:  Paresthesias  Headache   29 year old female here with ongoing headache for the past week with paresthesias of the left side of her face and left arm. Patient does have history of stroke 2 in the past. She is currently on daily aspirin. On exam, patient is in no acute distress. Neurologic exam is grossly nonfocal, she does have altered sensation on the left side of her face and left arm when compared with right. Her speech is clear and thought process is goal oriented. Given her history, will obtain lab work and head CT. Patient given migraine cocktail. Potential complicated migraine versus CVA. Due to delayed presentation, she has not a candidate for any acute intervention.  4:35 PM Labwork as above, no significant electrolyte imbalance.  U/a appears infectious, however patient without current urinary symptoms.  Will hold off on treatment pending urine culture.  CT had also negative for acute findings. After migraine cocktail of Benadryl, Reglan, and  Decadron, patient reports symptoms are the same. She continues to have headache and altered sensation of left side of face and left arm.  Neurologic exam remains unchanged currently.  I had a discussion with patient regarding MRI, she would prefer not to have this done unless absolutely necessary. I still feel there is a possibility that this may be a complicated migraine given that headache preceded paresthesias. Patient opted for second dose of medications and if no improvement or any new symptoms develop, will proceed with MRI. Patient and mother are in agreement with this plan of care.  Additional toradol and compazine ordered.  Care signed out to PA Piepenbrink.  Will reassess after second dose of meds.  If no improvement/continues having symptoms, would recommend to obtain MRI to r/o stroke given patient's history.  Garlon Hatchet, PA-C 07/29/15 1642  Mancel Bale, MD 07/29/15 [phone]

## 2015-07-29 NOTE — ED Notes (Signed)
Lab informed this nurse that CBC clotted. New order placed for redraw of CBC.

## 2015-07-29 NOTE — ED Notes (Addendum)
Onset headache and face twitching for one week with left arm tingling.  States headache 7/10 throbbing denies nausea or emesis. Patient has history of a stroke 2013.

## 2015-07-29 NOTE — ED Provider Notes (Signed)
Patient care acquired from Sharilyn Sites, PA-C pending re-evaluation after IV medications.  Results for orders placed or performed during the hospital encounter of 07/29/15  Basic metabolic panel  Result Value Ref Range   Sodium 139 135 - 145 mmol/L   Potassium 3.8 3.5 - 5.1 mmol/L   Chloride 107 101 - 111 mmol/L   CO2 22 22 - 32 mmol/L   Glucose, Bld 97 65 - 99 mg/dL   BUN 9 6 - 20 mg/dL   Creatinine, Ser 4.19 0.44 - 1.00 mg/dL   Calcium 9.3 8.9 - 62.2 mg/dL   GFR calc non Af Amer >60 >60 mL/min   GFR calc Af Amer >60 >60 mL/min   Anion gap 10 5 - 15  Urinalysis, Routine w reflex microscopic (not at [facility])  Result Value Ref Range   Color, Urine YELLOW YELLOW   APPearance CLOUDY (A) CLEAR   Specific Gravity, Urine 1.028 1.005 - 1.030   pH 5.5 5.0 - 8.0   Glucose, UA NEGATIVE NEGATIVE mg/dL   Hgb urine dipstick SMALL (A) NEGATIVE   Bilirubin Urine NEGATIVE NEGATIVE   Ketones, ur NEGATIVE NEGATIVE mg/dL   Protein, ur NEGATIVE NEGATIVE mg/dL   Urobilinogen, UA 0.2 0.0 - 1.0 mg/dL   Nitrite NEGATIVE NEGATIVE   Leukocytes, UA MODERATE (A) NEGATIVE  CBC  Result Value Ref Range   WBC 6.6 4.0 - 10.5 K/uL   RBC 4.56 3.87 - 5.11 MIL/uL   Hemoglobin 13.2 12.0 - 15.0 g/dL   HCT 29.7 98.9 - 21.1 %   MCV 87.1 78.0 - 100.0 fL   MCH 28.9 26.0 - 34.0 pg   MCHC 33.2 30.0 - 36.0 g/dL   RDW 94.1 74.0 - 81.4 %   Platelets 213 150 - 400 K/uL  Urine microscopic-add on  Result Value Ref Range   Squamous Epithelial / LPF FEW (A) RARE   WBC, UA 21-50 <3 WBC/hpf   RBC / HPF 0-2 <3 RBC/hpf   Bacteria, UA MANY (A) RARE   Urine-Other MUCOUS PRESENT   I-Stat Beta hCG blood, ED ([facility], [facility], [facility] only)  Result Value Ref Range   I-stat hCG, quantitative <5.0 <5 mIU/mL   Comment 3           Ct Head Wo Contrast  07/29/2015   CLINICAL DATA:  Frontal headache for 1 week.  EXAM: CT HEAD WITHOUT CONTRAST  TECHNIQUE: Contiguous axial images were obtained from the base of the skull through the vertex without  intravenous contrast.  COMPARISON:  CT scan of October 29, 2013.  FINDINGS: Bony calvarium appears intact. No mass effect or midline shift is noted. Ventricular size is within normal limits. There is no evidence of mass lesion, hemorrhage or acute infarction.  IMPRESSION: Normal head CT.   Electronically Signed   By: Lupita Raider, M.D.   On: 07/29/2015 16:24    5:46 PM patient endorses significant improvement of tingling sensation on left side of face and LUE. She states it has not completely resolved, but vastly improved. Patient endorses mild subjective decreased sensation to left side of face and LUE, otherwise no neurofocal deficits on examination. Re-discussed MRI in patient vs outpatient. Patient still prefers to be discharged home with neurology follow up. Given improvement of symptoms feel patient is safe to do so.   1. Paresthesias   2. Headache    Patient is stable at time of discharge    Francee Piccolo, PA-C 07/29/15 1815  Tilden Fossa, MD 07/29/15 [phone]

## 2015-07-29 NOTE — Discharge Instructions (Signed)
Please follow up with your primary care physician in 1-2 days. If you do not have one please call the [facility] and wellness Center number listed above. Please follow up with your neurologist to schedule a follow up appointment.  Please read all discharge instructions and return precautions.   Paresthesia Paresthesia is an abnormal burning or prickling sensation. This sensation is generally felt in the hands, arms, legs, or feet. However, it may occur in any part of the body. It is usually not painful. The feeling may be described as:  Tingling or numbness.  "Pins and needles."  Skin crawling.  Buzzing.  Limbs "falling asleep."  Itching. Most people experience temporary (transient) paresthesia at some time in their lives. CAUSES  Paresthesia may occur when you breathe too quickly (hyperventilation). It can also occur without any apparent cause. Commonly, paresthesia occurs when pressure is placed on a nerve. The feeling quickly goes away once the pressure is removed. For some people, however, paresthesia is a long-lasting (chronic) condition caused by an underlying disorder. The underlying disorder may be:  A traumatic, direct injury to nerves. Examples include a:  Broken (fractured) neck.  Fractured skull.  A disorder affecting the brain and spinal cord (central nervous system). Examples include:  Transverse myelitis.  Encephalitis.  Transient ischemic attack.  Multiple sclerosis.  Stroke.  Tumor or blood vessel problems, such as an arteriovenous malformation pressing against the brain or spinal cord.  A condition that damages the peripheral nerves (peripheral neuropathy). Peripheral nerves are not part of the brain and spinal cord. These conditions include:  Diabetes.  Peripheral vascular disease.  Nerve entrapment syndromes, such as carpal tunnel syndrome.  Shingles.  Hypothyroidism.  Vitamin B12 deficiencies.  Alcoholism.  Heavy metal poisoning (lead,  arsenic).  Rheumatoid arthritis.  Systemic lupus erythematosus. DIAGNOSIS  Your caregiver will attempt to find the underlying cause of your paresthesia. Your caregiver may:  Take your medical history.  Perform a physical exam.  Order various lab tests.  Order imaging tests. TREATMENT  Treatment for paresthesia depends on the underlying cause. HOME CARE INSTRUCTIONS  Avoid drinking alcohol.  You may consider massage or acupuncture to help relieve your symptoms.  Keep all follow-up appointments as directed by your caregiver. SEEK IMMEDIATE MEDICAL CARE IF:   You feel weak.  You have trouble walking or moving.  You have problems with speech or vision.  You feel confused.  You cannot control your bladder or bowel movements.  You feel numbness after an injury.  You faint.  Your burning or prickling feeling gets worse when walking.  You have pain, cramps, or dizziness.  You develop a rash. MAKE SURE YOU:  Understand these instructions.  Will watch your condition.  Will get help right away if you are not doing well or get worse. Document Released: 12/07/2002 Document Revised: 03/10/2012 Document Reviewed: 09/07/2011 [facility] Patient Information 2015 [place], [place]. This information is not intended to replace advice given to you by your health care provider. Make sure you discuss any questions you have with your health care provider.

## 2015-07-31 LAB — URINE CULTURE

## 2015-08-10 ENCOUNTER — Ambulatory Visit (INDEPENDENT_AMBULATORY_CARE_PROVIDER_SITE_OTHER): Payer: Managed Care, Other (non HMO) | Admitting: Neurology

## 2015-08-10 ENCOUNTER — Encounter: Payer: Self-pay | Admitting: Neurology

## 2015-08-10 VITALS — BP 118/68 | HR 70 | Ht 65.0 in | Wt 197.0 lb

## 2015-08-10 DIAGNOSIS — R202 Paresthesia of skin: Secondary | ICD-10-CM | POA: Diagnosis not present

## 2015-08-10 DIAGNOSIS — G43709 Chronic migraine without aura, not intractable, without status migrainosus: Secondary | ICD-10-CM

## 2015-08-10 MED ORDER — RIZATRIPTAN BENZOATE 5 MG PO TBDP: 5.0000 mg | ORAL_TABLET | ORAL | Status: DC | PRN

## 2015-08-10 NOTE — Progress Notes (Addendum)
PATIENT: Melissa [name] DOB: 04/24/1986  Chief Complaint  Patient presents with  . Headache    She has been keeping a daily headache for the last few weeks, along with left eye twitching.  She has tried to treat them with both ibuprofen and oxycodone without relief.  She recently had a normal CT head at [facility] ED.  . Numbness    Reports intermittent tingling in her left arm, hand, and fingers for three weeks.  She is concerned because of her previous cardiovascular and heart history.     HISTORICAL  Melissa [name] is a 29 years old right-handed female, seen in refer by her primary care physician Dr. Dorothyann Peng for evaluation of frequent headaches, left arm tingling, left eye twitching  I have reviewed her most recent ER presentation July 29 2015, for prolonged headaches, intermittent left facial left upper extremity paresthesia, she was given a cocktail of  Benadryl, Reglan, and Decadron without helping her headaches  Laboratory: Normal BMP, CBC, CAT scan of the brain without contrast July 29 2015 that was normal  She had a history of chronic left leg pain, under pain management, but she could not recall the name and last visit of the treating physician, taking hydrocodone as needed  I have personally reviewed MRI of the brain 2013, MRI, MRA of the brain 2014, CAT scan of brain in July 29 2015, that were normal.  She had long-standing history of migraines since high school, bilateral retro-orbital area pressure sometimes severe pounding headaches, she was [facility] by [facility] in the past, remember trigger point injection, she only had occasionally headaches in the past, but since July, she has more frequent prolonged severe headaches, woke up from sleep with severe retro-orbital headaches, lasting for few days, she has tried over-the-counter ibuprofen, allergy medications, hydrocodone without helping, sometimes headaches can last up to one week  She also complains  intermittent left eye muscle twitching, sometimes involving left cheek muscles  She complains of 3 weeks history of left hand paresthesia, frequent awakening at night time, she has to shake her hands to make sensation comes back, she has 8 months, 3 months, 34-year-old children at home, carrying them with her left hand often.   REVIEW OF SYSTEMS: Full 14 system review of systems performed and notable only for above  ALLERGIES: Allergies  Allergen Reactions  . Ginger Anaphylaxis    HOME MEDICATIONS: Current Outpatient Prescriptions  Medication Sig Dispense Refill  . albuterol (PROVENTIL HFA;VENTOLIN HFA) 108 (90 BASE) MCG/ACT inhaler Inhale 2 puffs into the lungs every 4 (four) hours as needed for wheezing or shortness of breath.    [name] albuterol (PROVENTIL) (2.5 MG/3ML) 0.083% nebulizer solution Take 2.5 mg by nebulization every 4 (four) hours as needed for wheezing or shortness of breath.    [name] aspirin EC 81 MG tablet Take 81 mg by mouth daily.    [name] etonogestrel (NEXPLANON) 68 MG IMPL implant 1 each by Subdermal route once.    . ferrous sulfate 325 (65 FE) MG tablet Take 1 tablet (325 mg total) by mouth 2 (two) times daily with a meal. 60 tablet 3  . fexofenadine (ALLEGRA) 180 MG tablet Take 180 mg by mouth daily as needed for allergies or rhinitis.    [name] ibuprofen (ADVIL,MOTRIN) 200 MG tablet Take 200 mg by mouth every 6 (six) hours as needed for headache, mild pain or moderate pain.    [name] ibuprofen (ADVIL,MOTRIN) 600 MG tablet Take 1 tablet (  600 mg total) by mouth every 6 (six) hours. 30 tablet 0  . labetalol (NORMODYNE) 100 MG tablet Take 100 mg by mouth 2 (two) times daily.    [name] oxyCODONE-acetaminophen (PERCOCET/ROXICET) 5-325 MG per tablet Take 1 tablet by mouth every 4 (four) hours as needed (for pain scale less than 7). 30 tablet 0   No current facility-administered medications for this visit.    PAST MEDICAL HISTORY: Past Medical History  Diagnosis Date  . Placenta previa  antepartum 2010    Placenta moved 3 days before baby was born  . Abnormal Pap smear 2010    Repeat pap done;Hysteroscopy;Last pap 2012;was normal  . Infection     BV;may get frequently  . Anemia 2010    Iron supplements PP  . Asthma     Has a nebulizer;triggered by stress  . Stroke 2013    MD thought may have had a stroke and heart attack;has neurologist @ [facility]  . Heart attack     MD thinks may have had a heart attack;Cardiologist @ [facility]  . Nerve pain     Chronic;d/t possible stroke and MI  . Sickle cell trait   . Headache(784.0)   . History of CVA (cerebrovascular accident) 04/09/2013    Pt reports presumptive "stroke" when she was 29y.o.; followed by [facility] neurology for "chronic nerve pain" and has been Rx'd Hydrocodone and Mobic  . Colon polyps     PAST SURGICAL HISTORY: Past Surgical History  Procedure Laterality Date  . Hysteroscopy  2010    FAMILY HISTORY: Family History  Problem Relation Age of Onset  . Sickle cell trait Father   . Hypertension Mother   . Anemia Mother   . Asthma Mother   . Cervical cancer Paternal Aunt   . Cervical cancer Paternal Grandmother   . Diabetes Paternal Grandmother   . Diabetes Paternal Grandfather   . Diabetes Paternal Aunt   . Asthma Brother   . Asthma Daughter   . Asthma Son     SOCIAL HISTORY:  Social History   Social History  . Marital Status: Single    Spouse Name: N/A  . Number of Children: 1  . Years of Education: 15   Occupational History  . Spare time    Social History Main Topics  . Smoking status: Never Smoker   . Smokeless tobacco: Never Used  . Alcohol Use: 0.0 oz/week    0 Standard drinks or equivalent per week     Comment: socially  . Drug Use: No  . Sexual Activity: Yes    Birth Control/ Protection: None   Other Topics Concern  . Not on file   Social History Narrative   Lives at home with her three children.   Right-handed.   1 cup caffeine daily.    PHYSICAL EXAM     Filed Vitals:   08/10/15 0843  BP: 118/68  Pulse: 70  Height:  (1.651 m)  Weight: 197 lb (89.359 kg)    Not recorded      Body mass index is 32.78 kg/(m^2).  PHYSICAL EXAMNIATION:  Gen: NAD, conversant, well nourised, obese, well groomed                     Cardiovascular: Regular rate rhythm, no peripheral edema, warm, nontender. Eyes: Conjunctivae clear without exudates or hemorrhage Neck: Supple, no carotid bruise. Pulmonary: Clear to auscultation bilaterally   NEUROLOGICAL EXAM:  MENTAL STATUS: Speech:    Speech is  normal; fluent and spontaneous with normal comprehension.  Cognition:     Orientation to time, place and person     Normal recent and remote memory     Normal Attention span and concentration     Normal Language, naming, repeating,spontaneous speech     Fund of knowledge   CRANIAL NERVES: CN II: Visual fields are full to confrontation. Fundoscopic exam is normal with sharp discs and no vascular changes. Pupils are round equal and briskly reactive to light. CN III, IV, VI: extraocular movement are normal. No ptosis. CN V: Facial sensation is intact to pinprick in all 3 divisions bilaterally. Corneal responses are intact.  CN VII: Face is symmetric with normal eye closure and smile. CN VIII: Hearing is normal to rubbing fingers CN IX, X: Palate elevates symmetrically. Phonation is normal. CN XI: Head turning and shoulder shrug are intact CN XII: Tongue is midline with normal movements and no atrophy.  MOTOR: There is no pronator drift of out-stretched arms. Muscle bulk and tone are normal. Muscle strength is normal, with exception of mild left abductor pollicis brevis weakness  REFLEXES: Reflexes are 2+ and symmetric at the biceps, triceps, knees, and ankles. Plantar responses are flexor.  SENSORY: Intact to light touch, pinprick, position sense, and vibration sense are intact in fingers and toes.  COORDINATION: Rapid alternating movements and  fine finger movements are intact. There is no dysmetria on finger-to-nose and heel-knee-shin.    GAIT/STANCE: Posture is normal. Gait is steady with normal steps, base, arm swing, and turning. Heel and toe walking are normal. Tandem gait is normal.  Romberg is absent.   DIAGNOSTIC DATA (LABS, IMAGING, TESTING) - I reviewed patient records, labs, notes, testing and imaging myself where available.   ASSESSMENT AND PLAN  Aleyza A Burkette is a 29 y.o. female   Migraine headaches  Maxalt 5 mg as needed  There was chart reported strokelike symptoms, questionable chronic artery disease, I have reviewed previous MRI of the brain, MRA of the brain that was normal,     Left hand paresthesia  Possible carpal tunnel syndromes, EMG nerve conduction study  Prescription for wrist splint Left hemifacial spasm   Levert Feinstein, M.D. Ph.D.  [facility] [address], [address] [place], [place] [address] Ph: [phone] Fax: [phone]  CC: To Dr. Dorothyann Peng

## 2015-08-12 ENCOUNTER — Telehealth: Payer: Self-pay | Admitting: Neurology

## 2015-08-12 NOTE — Telephone Encounter (Signed)
We do not prescribed Fentanyl for this patient.  I called back.  Got no answer.  I called the pharmacy.  They said the ins card they have on file is expired.  They will reprocess the Rx (Rizatriptan) with new [employer] coverage and will contact us if anything further is needed.

## 2015-08-12 NOTE — Telephone Encounter (Signed)
Patient called stating insurance is requiring prior auth for fentaNYL (DURAGESIC - DOSED MCG/HR) 75 MCG/HR . Please call and advise. Patient can be reached at [phone].

## 2015-08-26 ENCOUNTER — Emergency Department (HOSPITAL_COMMUNITY): Payer: Managed Care, Other (non HMO)

## 2015-08-26 ENCOUNTER — Inpatient Hospital Stay (HOSPITAL_COMMUNITY)
Admission: EM | Admit: 2015-08-26 | Discharge: 2015-09-01 | DRG: 202 | Disposition: A | Discharge status: Home / Self Care | Payer: Managed Care, Other (non HMO) | Attending: Pulmonary Disease | Admitting: Pulmonary Disease

## 2015-08-26 ENCOUNTER — Encounter (HOSPITAL_COMMUNITY): Payer: Self-pay | Admitting: Emergency Medicine

## 2015-08-26 DIAGNOSIS — Z8673 Personal history of transient ischemic attack (TIA), and cerebral infarction without residual deficits: Secondary | ICD-10-CM | POA: Diagnosis not present

## 2015-08-26 DIAGNOSIS — R5381 Other malaise: Secondary | ICD-10-CM | POA: Diagnosis not present

## 2015-08-26 DIAGNOSIS — J4532 Mild persistent asthma with status asthmaticus: Secondary | ICD-10-CM

## 2015-08-26 DIAGNOSIS — I10 Essential (primary) hypertension: Secondary | ICD-10-CM | POA: Diagnosis present

## 2015-08-26 DIAGNOSIS — Z825 Family history of asthma and other chronic lower respiratory diseases: Secondary | ICD-10-CM | POA: Diagnosis not present

## 2015-08-26 DIAGNOSIS — J4542 Moderate persistent asthma with status asthmaticus: Secondary | ICD-10-CM

## 2015-08-26 DIAGNOSIS — J9600 Acute respiratory failure, unspecified whether with hypoxia or hypercapnia: Secondary | ICD-10-CM | POA: Diagnosis present

## 2015-08-26 DIAGNOSIS — R0600 Dyspnea, unspecified: Secondary | ICD-10-CM | POA: Insufficient documentation

## 2015-08-26 DIAGNOSIS — J4552 Severe persistent asthma with status asthmaticus: Secondary | ICD-10-CM | POA: Diagnosis present

## 2015-08-26 DIAGNOSIS — E872 Acidosis: Secondary | ICD-10-CM | POA: Diagnosis present

## 2015-08-26 DIAGNOSIS — K219 Gastro-esophageal reflux disease without esophagitis: Secondary | ICD-10-CM | POA: Insufficient documentation

## 2015-08-26 DIAGNOSIS — Z791 Long term (current) use of non-steroidal anti-inflammatories (NSAID): Secondary | ICD-10-CM | POA: Diagnosis not present

## 2015-08-26 DIAGNOSIS — D573 Sickle-cell trait: Secondary | ICD-10-CM | POA: Diagnosis present

## 2015-08-26 DIAGNOSIS — Z7951 Long term (current) use of inhaled steroids: Secondary | ICD-10-CM | POA: Diagnosis not present

## 2015-08-26 DIAGNOSIS — Z7982 Long term (current) use of aspirin: Secondary | ICD-10-CM | POA: Diagnosis not present

## 2015-08-26 DIAGNOSIS — Z79899 Other long term (current) drug therapy: Secondary | ICD-10-CM | POA: Diagnosis not present

## 2015-08-26 DIAGNOSIS — J45902 Unspecified asthma with status asthmaticus: Secondary | ICD-10-CM | POA: Diagnosis present

## 2015-08-26 LAB — BASIC METABOLIC PANEL
ANION GAP: 11 (ref 5–15)
Anion gap: 6 (ref 5–15)
BUN: 8 mg/dL (ref 6–20)
BUN: 7 mg/dL (ref 6–20)
CALCIUM: 8.7 mg/dL — AB (ref 8.9–10.3)
CHLORIDE: 111 mmol/L (ref 101–111)
CO2: 20 mmol/L — AB (ref 22–32)
CO2: 22 mmol/L (ref 22–32)
CREATININE: 0.62 mg/dL (ref 0.44–1.00)
Calcium: 8.8 mg/dL — ABNORMAL LOW (ref 8.9–10.3)
Chloride: 105 mmol/L (ref 101–111)
Creatinine, Ser: 0.89 mg/dL (ref 0.44–1.00)
GFR calc Af Amer: >60 mL/min (ref >60)
GFR calc non Af Amer: >60 mL/min (ref >60)
Glucose, Bld: 220 mg/dL — ABNORMAL HIGH (ref 65–99)
Glucose, Bld: 150 mg/dL — ABNORMAL HIGH (ref 65–99)
POTASSIUM: 4.2 mmol/L (ref 3.5–5.1)
Potassium: 2.9 mmol/L — ABNORMAL LOW (ref 3.5–5.1)
SODIUM: 139 mmol/L (ref 135–145)
Sodium: 136 mmol/L (ref 135–145)

## 2015-08-26 LAB — CBC WITH DIFFERENTIAL/PLATELET
BASOS ABS: 0 10*3/uL (ref 0.0–0.1)
BASOS PCT: 0 % (ref 0–1)
Eosinophils Absolute: 0.2 10*3/uL (ref 0.0–0.7)
Eosinophils Relative: 2 % (ref 0–5)
HEMATOCRIT: 40.1 % (ref 36.0–46.0)
HEMOGLOBIN: 13.4 g/dL (ref 12.0–15.0)
LYMPHS PCT: 18 % (ref 12–46)
Lymphs Abs: 1.8 10*3/uL (ref 0.7–4.0)
MCH: 29 pg (ref 26.0–34.0)
MCHC: 33.4 g/dL (ref 30.0–36.0)
MCV: 86.8 fL (ref 78.0–100.0)
MONO ABS: 0.4 10*3/uL (ref 0.1–1.0)
Monocytes Relative: 4 % (ref 3–12)
NEUTROS ABS: 7.6 10*3/uL (ref 1.7–7.7)
NEUTROS PCT: 76 % (ref 43–77)
Platelets: 192 10*3/uL (ref 150–400)
RBC: 4.62 MIL/uL (ref 3.87–5.11)
RDW: 13.9 % (ref 11.5–15.5)
WBC: 9.9 10*3/uL (ref 4.0–10.5)

## 2015-08-26 LAB — BLOOD GAS, ARTERIAL
ACID-BASE DEFICIT: 4.5 mmol/L — AB (ref 0.0–2.0)
Bicarbonate: 18.2 mEq/L — ABNORMAL LOW (ref 20.0–24.0)
DRAWN BY: 307971
O2 Content: 7 L/min
O2 Saturation: 98.7 %
PATIENT TEMPERATURE: 98.6
PO2 ART: 174 mmHg — AB (ref 80.0–100.0)
TCO2: 15.5 mmol/L (ref 0–100)
pCO2 arterial: 28.7 mmHg — ABNORMAL LOW (ref 35.0–45.0)
pH, Arterial: 7.417 (ref 7.350–7.450)

## 2015-08-26 LAB — MAGNESIUM: MAGNESIUM: 2.1 mg/dL (ref 1.7–2.4)

## 2015-08-26 LAB — TROPONIN I: Troponin I: <0.03 ng/mL (ref <0.031)

## 2015-08-26 LAB — MRSA PCR SCREENING: MRSA by PCR: NEGATIVE

## 2015-08-26 LAB — I-STAT BETA HCG BLOOD, ED (MC, WL, AP ONLY)

## 2015-08-26 LAB — LACTIC ACID, PLASMA: Lactic Acid, Venous: 2.2 mmol/L (ref 0.5–2.0)

## 2015-08-26 MED ORDER — DOXYCYCLINE HYCLATE 100 MG PO TABS
100.0000 mg | ORAL_TABLET | Freq: Two times a day (BID) | ORAL | Status: DC
  Administered 2015-08-26: 100 mg via ORAL
  Filled 2015-08-26: qty 1

## 2015-08-26 MED ORDER — SUMATRIPTAN SUCCINATE 25 MG PO TABS
25.0000 mg | ORAL_TABLET | ORAL | Status: DC | PRN
  Filled 2015-08-26: qty 1

## 2015-08-26 MED ORDER — LIP MEDEX EX OINT
TOPICAL_OINTMENT | CUTANEOUS | Status: AC
  Administered 2015-08-26: 1
  Filled 2015-08-26: qty 7

## 2015-08-26 MED ORDER — ALBUTEROL SULFATE (2.5 MG/3ML) 0.083% IN NEBU
2.5000 mg | INHALATION_SOLUTION | RESPIRATORY_TRACT | Status: DC | PRN
  Administered 2015-08-29: 2.5 mg via RESPIRATORY_TRACT
  Filled 2015-08-26: qty 3

## 2015-08-26 MED ORDER — ASPIRIN EC 81 MG PO TBEC
81.0000 mg | DELAYED_RELEASE_TABLET | Freq: Every day | ORAL | Status: DC
  Administered 2015-08-26 – 2015-09-01 (×7): 81 mg via ORAL
  Filled 2015-08-26 (×7): qty 1

## 2015-08-26 MED ORDER — CETYLPYRIDINIUM CHLORIDE 0.05 % MT LIQD
7.0000 mL | Freq: Two times a day (BID) | OROMUCOSAL | Status: DC
  Administered 2015-08-26 – 2015-09-01 (×12): 7 mL via OROMUCOSAL

## 2015-08-26 MED ORDER — MAGNESIUM SULFATE 50 % IJ SOLN: 1.0000 g | Freq: Once | INTRAMUSCULAR | Status: DC

## 2015-08-26 MED ORDER — INFLUENZA VAC SPLIT QUAD 0.5 ML IM SUSY
0.5000 mL | PREFILLED_SYRINGE | INTRAMUSCULAR | Status: DC | PRN
  Filled 2015-08-26: qty 0.5

## 2015-08-26 MED ORDER — BUDESONIDE 0.5 MG/2ML IN SUSP
0.5000 mg | Freq: Two times a day (BID) | RESPIRATORY_TRACT | Status: DC
  Administered 2015-08-26 – 2015-08-27 (×4): 0.5 mg via RESPIRATORY_TRACT
  Filled 2015-08-26: qty 2
  Filled 2015-08-26: qty 4
  Filled 2015-08-26 (×3): qty 2
  Filled 2015-08-26: qty 4
  Filled 2015-08-26: qty 2

## 2015-08-26 MED ORDER — LORAZEPAM 2 MG/ML IJ SOLN
1.0000 mg | Freq: Once | INTRAMUSCULAR | Status: AC
  Administered 2015-08-26: 1 mg via INTRAVENOUS

## 2015-08-26 MED ORDER — METHYLPREDNISOLONE SODIUM SUCC 125 MG IJ SOLR
80.0000 mg | Freq: Two times a day (BID) | INTRAMUSCULAR | Status: DC
  Administered 2015-08-26 (×2): 80 mg via INTRAVENOUS
  Filled 2015-08-26 (×2): qty 2

## 2015-08-26 MED ORDER — ALBUTEROL SULFATE (2.5 MG/3ML) 0.083% IN NEBU
15.0000 mg | INHALATION_SOLUTION | Freq: Once | RESPIRATORY_TRACT | Status: AC
  Filled 2015-08-26: qty 18

## 2015-08-26 MED ORDER — IPRATROPIUM BROMIDE 0.02 % IN SOLN
RESPIRATORY_TRACT | Status: AC
  Administered 2015-08-26: 0.5 mg
  Filled 2015-08-26: qty 2.5

## 2015-08-26 MED ORDER — EPINEPHRINE 0.3 MG/0.3ML IJ SOAJ
0.3000 mg | Freq: Once | INTRAMUSCULAR | Status: AC
  Administered 2015-08-26: 0.3 mg via INTRAMUSCULAR
  Filled 2015-08-26: qty 0.3

## 2015-08-26 MED ORDER — ACETAMINOPHEN 325 MG PO TABS
650.0000 mg | ORAL_TABLET | ORAL | Status: DC | PRN
  Administered 2015-08-26 – 2015-09-01 (×7): 650 mg via ORAL
  Filled 2015-08-26 (×7): qty 2

## 2015-08-26 MED ORDER — SODIUM CHLORIDE 0.9 % IV SOLN: 250.0000 mL | INTRAVENOUS | Status: DC | PRN

## 2015-08-26 MED ORDER — ALBUTEROL SULFATE (2.5 MG/3ML) 0.083% IN NEBU
2.5000 mg | INHALATION_SOLUTION | RESPIRATORY_TRACT | Status: DC
  Administered 2015-08-26 – 2015-08-28 (×12): 2.5 mg via RESPIRATORY_TRACT
  Filled 2015-08-26 (×12): qty 3

## 2015-08-26 MED ORDER — LORAZEPAM 2 MG/ML IJ SOLN
INTRAMUSCULAR | Status: AC
  Administered 2015-08-26: 1 mg via INTRAVENOUS
  Filled 2015-08-26: qty 1

## 2015-08-26 MED ORDER — ALBUTEROL (5 MG/ML) CONTINUOUS INHALATION SOLN
15.0000 mg/h | INHALATION_SOLUTION | RESPIRATORY_TRACT | Status: DC
  Administered 2015-08-26: 15 mg/h via RESPIRATORY_TRACT

## 2015-08-26 MED ORDER — HYDRALAZINE HCL 20 MG/ML IJ SOLN: 10.0000 mg | INTRAMUSCULAR | Status: DC | PRN

## 2015-08-26 MED ORDER — LORATADINE 10 MG PO TABS
10.0000 mg | ORAL_TABLET | Freq: Every day | ORAL | Status: DC
  Administered 2015-08-26 – 2015-09-01 (×7): 10 mg via ORAL
  Filled 2015-08-26 (×7): qty 1

## 2015-08-26 MED ORDER — IPRATROPIUM BROMIDE 0.02 % IN SOLN: 0.5000 mg | Freq: Once | RESPIRATORY_TRACT | Status: AC

## 2015-08-26 MED ORDER — MAGNESIUM SULFATE IN D5W 10-5 MG/ML-% IV SOLN
1.0000 g | Freq: Once | INTRAVENOUS | Status: AC
  Administered 2015-08-26: 1 g via INTRAVENOUS
  Filled 2015-08-26: qty 100

## 2015-08-26 MED ORDER — RIZATRIPTAN BENZOATE 5 MG PO TBDP: 5.0000 mg | ORAL_TABLET | ORAL | Status: DC | PRN

## 2015-08-26 MED ORDER — CHLORHEXIDINE GLUCONATE 0.12 % MT SOLN
15.0000 mL | Freq: Two times a day (BID) | OROMUCOSAL | Status: DC
  Administered 2015-08-26 – 2015-08-30 (×8): 15 mL via OROMUCOSAL
  Filled 2015-08-26 (×8): qty 15

## 2015-08-26 MED ORDER — ALBUTEROL (5 MG/ML) CONTINUOUS INHALATION SOLN
INHALATION_SOLUTION | RESPIRATORY_TRACT | Status: AC
Start: 2015-08-26 — End: 2015-08-26
  Filled 2015-08-26: qty 20

## 2015-08-26 MED ORDER — ENOXAPARIN SODIUM 40 MG/0.4ML ~~LOC~~ SOLN
40.0000 mg | SUBCUTANEOUS | Status: DC
  Administered 2015-08-26 – 2015-08-27 (×2): 40 mg via SUBCUTANEOUS
  Filled 2015-08-26 (×3): qty 0.4

## 2015-08-26 MED ORDER — ONDANSETRON HCL 4 MG/2ML IJ SOLN: 4.0000 mg | Freq: Four times a day (QID) | INTRAMUSCULAR | Status: DC | PRN

## 2015-08-26 NOTE — H&P (Signed)
PULMONARY / CRITICAL CARE MEDICINE   Name: Melissa [name] MRN: [id number] DOB: [birth year]    ADMISSION DATE:  08/26/2015 CONSULTATION DATE:  08/26/2015  REFERRING MD :  Dr. Patria Mane, ER physician  CHIEF COMPLAINT:  Shortness of breath  INITIAL PRESENTATION: 29 year old female with a past medical history significant for asthma came to the [facility] emergency department on 08/26/2015 complaining of shortness of breath, felt to be in status asthmaticus.  STUDIES:    SIGNIFICANT EVENTS:   HISTORY OF PRESENT ILLNESS:  This is a pleasant 29 year old nonsmoking female with a history of asthma who came to the [facility] emergency department on 08/26/2015 complaining of 3 days of shortness of breath. She says that she had no sick contacts but developed the onset of shortness of breath, substernal chest pain, and a dry cough 3 days prior to admission. She had been using albuterol at home. She has never had a severe asthma exacerbation in the past. She denied fevers or chills. She had had a slight amount of leg swelling that had not changed recently. She came to [facility] in the morning and was noted to be profoundly short of breath with very little air movement. She was treated by EMS en route with Solu-Medrol, magnesium, and albuterol. In the [facility] emergency department she was given more Solu-Medrol and magnesium, as well as epinephrine. She was treated with BiPAP. By the time of her arrival her shortness of breath had significantly improved.  PAST MEDICAL HISTORY :   has a past medical history of Placenta previa antepartum (2010); Abnormal Pap smear (2010); Infection; Anemia (2010); Asthma; Stroke (2013); Heart attack; Nerve pain; Sickle cell trait; Headache(784.0); History of CVA (cerebrovascular accident) (04/09/2013); and Colon polyps.  has past surgical history that includes Hysteroscopy (2010). Prior to Admission medications   Medication Sig Start Date End Date Taking?  Authorizing Provider  albuterol (PROVENTIL HFA;VENTOLIN HFA) 108 (90 BASE) MCG/ACT inhaler Inhale 2 puffs into the lungs every 4 (four) hours as needed for wheezing or shortness of breath.   Yes Historical Provider, MD  albuterol (PROVENTIL) (2.5 MG/3ML) 0.083% nebulizer solution Take 2.5 mg by nebulization every 4 (four) hours as needed for wheezing or shortness of breath.   Yes Historical Provider, MD  aspirin EC 81 MG tablet Take 81 mg by mouth daily.   Yes Historical Provider, MD  etonogestrel (NEXPLANON) 68 MG IMPL implant 1 each by Subdermal route once.   Yes Historical Provider, MD  fexofenadine (ALLEGRA) 180 MG tablet Take 180 mg by mouth daily as needed for allergies or rhinitis.   Yes Historical Provider, MD  ibuprofen (ADVIL,MOTRIN) 200 MG tablet Take 200 mg by mouth every 6 (six) hours as needed for headache, mild pain or moderate pain.   Yes Historical Provider, MD  labetalol (NORMODYNE) 100 MG tablet Take 100 mg by mouth daily.    Yes Historical Provider, MD  rizatriptan (MAXALT-MLT) 5 MG disintegrating tablet Take 1 tablet (5 mg total) by mouth as needed. May repeat in 2 hours if needed 08/10/15  Yes Levert Feinstein, MD  ferrous sulfate 325 (65 FE) MG tablet Take 1 tablet (325 mg total) by mouth 2 (two) times daily with a meal. Patient not taking: Reported on 08/26/2015 12/01/14   Venus Standard, CNM   Allergies  Allergen Reactions  . Ginger Anaphylaxis    FAMILY HISTORY:  indicated that her mother is alive. She indicated that her father is alive.  SOCIAL HISTORY:  reports that she has  never smoked. She has never used smokeless tobacco. She reports that she drinks alcohol. She reports that she does not use illicit drugs.  REVIEW OF SYSTEMS:   Gen: Denies fever, chills, weight change, fatigue, night sweats HEENT: Denies blurred vision, double vision, hearing loss, tinnitus, sinus congestion, rhinorrhea, sore throat, neck stiffness, dysphagia PULM: Per history of present illness CV:  Denies chest pain, edema, orthopnea, paroxysmal nocturnal dyspnea, palpitations GI: Denies abdominal pain, nausea, vomiting, diarrhea, hematochezia, melena, constipation, change in bowel habits GU: Denies dysuria, hematuria, polyuria, oliguria, urethral discharge Endocrine: Denies hot or cold intolerance, polyuria, polyphagia or appetite change Derm: Denies rash, dry skin, scaling or peeling skin change Heme: Denies easy bruising, bleeding, bleeding gums Neuro: Denies headache, numbness, weakness, slurred speech, loss of memory or consciousness   SUBJECTIVE:   VITAL SIGNS: Temp:  [98.9 F (37.2 C)] 98.9 F (37.2 C) (08/26 0713) Pulse Rate:  [130-131] 131 (08/26 0839) Resp:  [26-40] 40 (08/26 0839) BP: (140-157)/(78-94) 140/78 mmHg (08/26 0839) SpO2:  [99 %-100 %] 100 % (08/26 0839) HEMODYNAMICS:   VENTILATOR SETTINGS:   INTAKE / OUTPUT: No intake or output data in the 24 hours ending 08/26/15 0917  PHYSICAL EXAMINATION: General:  Resting comfortably on BiPAP Neuro:  Awake alert, moves all 4 extremities HEENT:  NCAT, BiPAP mask in place, EOMI Cardiovascular:  Tachycardic, no murmurs Lungs:  Good air movement, no wheezing on my exam Abdomen:  Bowel sounds positive, soft nontender Musculoskeletal:  Normal and tone, no deformities Skin:  No rash or skin breakdown, trace edema in ankles  LABS:  CBC  Recent Labs Lab 08/26/15 0815  WBC 9.9  HGB 13.4  HCT 40.1  PLT 192   Coag's No results for input(s): APTT, INR in the last 168 hours. BMET  Recent Labs Lab 08/26/15 0815  NA 136  K 2.9*  CL 105  CO2 20*  BUN 8  CREATININE 0.89  GLUCOSE 220*   Electrolytes  Recent Labs Lab 08/26/15 0815  CALCIUM 8.7*   Sepsis Markers  Recent Labs Lab 08/26/15 0745  LATICACIDVEN 2.2*   ABG  Recent Labs Lab 08/26/15 0737  PHART 7.417  PCO2ART 28.7*  PO2ART 174*   Liver Enzymes No results for input(s): AST, ALT, ALKPHOS, BILITOT, ALBUMIN in the last 168  hours. Cardiac Enzymes  Recent Labs Lab 08/26/15 0815  TROPONINI <0.03   Glucose No results for input(s): GLUCAP in the last 168 hours.  Imaging Dg Chest Portable 1 View  08/26/2015   CLINICAL DATA:  Shortness of Breath  EXAM: PORTABLE CHEST - 1 VIEW  COMPARISON:  04/27/2011  FINDINGS: Cardiac shadow is mildly enlarged. Left basilar atelectasis is identified. The lungs are otherwise clear. No acute bony abnormality is noted.  IMPRESSION: Left basilar atelectasis/ early infiltrate.   Electronically Signed   By: Alcide Clever M.D.   On: 08/26/2015 07:44     ASSESSMENT / PLAN:  PULMONARY A:Status asthmaticus: Fortunately her respiratory status is improving and she is now breathing comfortably on BiPAP. She needs admission to the ICU for careful monitoring.   She denies having a severe exacerbation of asthma in the past but she is clearly having one now. She denies recent URI or any sort of environmental exposure. What she comes off BiPAP we will need to get further history to try to determine why this happened.  She will need to be treated with inhaled steroid as an outpatient to prevent this from happening again.  P:   Admit to  ICU BiPAP when necessary Budesonide nebulizer twice a day for today Solu-Medrol 80 mg IV every 12 hours Albuterol every 4 scheduled, every 2 when necessary Tomorrow, if better add Symbicort 2 puffs twice a day monitor O2 saturation closely  CARDIOVASCULAR A:  History of hypertension Questionable history of coronary artery disease, no documented evidence of that at our facility P:   Hold labetalol for now considering asthma exacerbation Hydralazine when necessary systolic blood pressure greater than 160 Consider restarting labetalol tomorrow if respiratory status stable Telemetry monitoring Continue aspirin  RENAL A:   Mild lactic acidosis secondary to albuterol adminstration P:   Monitor BMET and UOP Replace electrolytes as  needed   GASTROINTESTINAL A:   No acute issues P:   Regular diet  HEMATOLOGIC A:   No acute issues P:   Monitor for bleeding  INFECTIOUS A:   Asthma exacerbation, no clear evidence of infection P:   We'll empirically start doxycycline for now but could likely stop tomorrow if no evidence of bronchitis  ENDOCRINE A:  No acute issues P:   Monitor blood glucose  NEUROLOGIC A:   No acute issues P:   Monitor neuro status in ICU   FAMILY  - Updates:  none available     TODAY'S SUMMARY: 29 year old with status asthmaticus, uncertain trigger. Now improved status post aggressive treatment in the emergency department. We'll admit to ICU for careful monitoring, but appears to be improving and will hopefully not need intubation for respiratory failure.  My cc time 40 minutes   Heber Bluffdale, MD [place] [facility] Pager: [phone] Cell: [phone] After 3pm or if no response, call [phone]  08/26/2015, 9:17 AM

## 2015-08-26 NOTE — ED Notes (Signed)
X-ray at bedside

## 2015-08-26 NOTE — Clinical Documentation Improvement (Signed)
Pulmonology  Possible conditions:  Document Acuity - Acute, Chronic, Acute on Chronic  Document Inclusion Of - Hypoxia, Hypercapnia, Combination of Both  Other  Clinically Undetermined  Document any associated diagnoses/conditions.  Supporting Information: (as per ED note) "Pulmonary/Chest:  Minimal air movement. Short wheeze bilaterally. Tachypnea. Accessory muscle use. Unable to speak "  Admit to ICU  BiPAP when necessary  Budesonide nebulizer twice a day for today  Solu-Medrol 80 mg IV every 12 hours  Albuterol every 4 scheduled, every 2 when necessary  Tomorrow, if better add Symbicort 2 puffs twice a day  monitor O2 saturation closely   Please exercise your independent, professional judgment when responding. A specific answer is not anticipated or expected.  Thank You, Nevin Bloodgood, RN, BSN, CCDS,Clinical Documentation Specialist:  [phone]  [phone]=Cell [place]- Health Information Management

## 2015-08-26 NOTE — Progress Notes (Signed)
UR completed 

## 2015-08-26 NOTE — ED Notes (Signed)
Pt from home via EMS. Pt stated that her nebulizer machine has not been working for one day. When EMS arrived on the scene, she was sitting on the floor and wheezing. Pt was given 3 breathing treatments and solumedrol in route along with 2 grams of mag sulfate

## 2015-08-26 NOTE — ED Notes (Signed)
MD at bedside. 

## 2015-08-26 NOTE — ED Notes (Signed)
Bed: GN56 Expected date:  Expected time:  Means of arrival:  Comments: EMS asthma exacerbation

## 2015-08-26 NOTE — ED Provider Notes (Signed)
CSN: [id number]     Arrival date & time 08/26/15  0709 History   First MD Initiated Contact with Patient 08/26/15 [phone]     Chief Complaint  Patient presents with  . Asthma     Level V caveat: Severe respiratory distress  HPI Patient address emergency department by paramedics after 3 treatments of albuterol and 125 mg of Solu-Medrol as well as 2 g of magnesium for severe asthma exacerbation.  Patient is unable to provide any history secondary to severe respiratory distress.  She is unable to speak.  Her heart rate is 130.  Patient shakes her head no when asked if she is ever been intubated for her asthma or admitted to the intensive care unit.   Past Medical History  Diagnosis Date  . Placenta previa antepartum 2010    Placenta moved 3 days before baby was born  . Abnormal Pap smear 2010    Repeat pap done;Hysteroscopy;Last pap 2012;was normal  . Infection     BV;may get frequently  . Anemia 2010    Iron supplements PP  . Asthma     Has a nebulizer;triggered by stress  . Stroke 2013    MD thought may have had a stroke and heart attack;has neurologist @ [facility]  . Heart attack     MD thinks may have had a heart attack;Cardiologist @ [facility]  . Nerve pain     Chronic;d/t possible stroke and MI  . Sickle cell trait   . Headache(784.0)   . History of CVA (cerebrovascular accident) 04/09/2013    Pt reports presumptive "stroke" when she was 29y.o.; followed by [facility] neurology for "chronic nerve pain" and has been Rx'd Hydrocodone and Mobic  . Colon polyps    Past Surgical History  Procedure Laterality Date  . Hysteroscopy  2010   Family History  Problem Relation Age of Onset  . Sickle cell trait Father   . Hypertension Mother   . Anemia Mother   . Asthma Mother   . Cervical cancer Paternal Aunt   . Cervical cancer Paternal Grandmother   . Diabetes Paternal Grandmother   . Diabetes Paternal Grandfather   . Diabetes Paternal Aunt   . Asthma Brother   .  Asthma Daughter   . Asthma Son    Social History  Substance Use Topics  . Smoking status: Never Smoker   . Smokeless tobacco: Never Used  . Alcohol Use: 0.0 oz/week    0 Standard drinks or equivalent per week     Comment: socially   OB History    Gravida Para Term Preterm AB TAB SAB Ectopic Multiple Living   3 3 3       0 3     Review of Systems  Unable to perform ROS     Allergies  Ginger  Home Medications   Prior to Admission medications   Medication Sig Start Date End Date Taking? Authorizing Provider  albuterol (PROVENTIL HFA;VENTOLIN HFA) 108 (90 BASE) MCG/ACT inhaler Inhale 2 puffs into the lungs every 4 (four) hours as needed for wheezing or shortness of breath.    Historical Provider, MD  albuterol (PROVENTIL) (2.5 MG/3ML) 0.083% nebulizer solution Take 2.5 mg by nebulization every 4 (four) hours as needed for wheezing or shortness of breath.    Historical Provider, MD  aspirin EC 81 MG tablet Take 81 mg by mouth daily.    Historical Provider, MD  etonogestrel (NEXPLANON) 68 MG IMPL implant 1 each by Subdermal route once.  Historical Provider, MD  ferrous sulfate 325 (65 FE) MG tablet Take 1 tablet (325 mg total) by mouth 2 (two) times daily with a meal. 12/01/14   Venus Standard, CNM  fexofenadine (ALLEGRA) 180 MG tablet Take 180 mg by mouth daily as needed for allergies or rhinitis.    Historical Provider, MD  ibuprofen (ADVIL,MOTRIN) 200 MG tablet Take 200 mg by mouth every 6 (six) hours as needed for headache, mild pain or moderate pain.    Historical Provider, MD  ibuprofen (ADVIL,MOTRIN) 600 MG tablet Take 1 tablet (600 mg total) by mouth every 6 (six) hours. 12/01/14   Venus Standard, CNM  labetalol (NORMODYNE) 100 MG tablet Take 100 mg by mouth 2 (two) times daily.    Historical Provider, MD  oxyCODONE-acetaminophen (PERCOCET/ROXICET) 5-325 MG per tablet Take 1 tablet by mouth every 4 (four) hours as needed (for pain scale less than 7). 12/01/14   Venus Standard,  CNM  rizatriptan (MAXALT-MLT) 5 MG disintegrating tablet Take 1 tablet (5 mg total) by mouth as needed. May repeat in 2 hours if needed 08/10/15   Levert Feinstein, MD   BP 157/82 mmHg  Pulse 130  Temp(Src) 98.9 F (37.2 C) (Oral)  Resp 26  SpO2 100%  LMP 07/22/2015 (Exact Date) Physical Exam  Constitutional: She appears well-developed and well-nourished. She appears distressed.  HENT:  Head: Normocephalic and atraumatic.  Eyes: EOM are normal.  Neck: Normal range of motion.  Cardiovascular: Regular rhythm.   tachycardic  Pulmonary/Chest:  Minimal air movement.  Short wheeze bilaterally.  Tachypnea.  Accessory muscle use.  Unable to speak  Abdominal: Soft. She exhibits no distension. There is no tenderness.  Musculoskeletal: Normal range of motion.  Neurological: She is alert.  Skin: Skin is warm and dry.  Psychiatric: She has a normal mood and affect. Judgment normal.  Nursing note and vitals reviewed.   ED Course  Procedures (including critical care time)  CRITICAL CARE Performed by: Lyanne Co Total critical care time: 37 Critical care time was exclusive of separately billable procedures and treating other patients. Critical care was necessary to treat or prevent imminent or life-threatening deterioration. Critical care was time spent personally by me on the following activities: development of treatment plan with patient and/or surrogate as well as nursing, discussions with consultants, evaluation of patient's response to treatment, examination of patient, obtaining history from patient or surrogate, ordering and performing treatments and interventions, ordering and review of laboratory studies, ordering and review of radiographic studies, pulse oximetry and re-evaluation of patient's condition.   Labs Review Labs Reviewed  BLOOD GAS, ARTERIAL - Abnormal; Notable for the following:    pCO2 arterial 28.7 (*)    pO2, Arterial 174 (*)    Bicarbonate 18.2 (*)    Acid-base  deficit 4.5 (*)    All other components within normal limits  CBC WITH DIFFERENTIAL/PLATELET  BASIC METABOLIC PANEL  TROPONIN I  LACTIC ACID, PLASMA  I-STAT BETA HCG BLOOD, ED ([facility], [facility], [facility] ONLY)    Imaging Review Dg Chest Portable 1 View  08/26/2015   CLINICAL DATA:  Shortness of Breath  EXAM: PORTABLE CHEST - 1 VIEW  COMPARISON:  04/27/2011  FINDINGS: Cardiac shadow is mildly enlarged. Left basilar atelectasis is identified. The lungs are otherwise clear. No acute bony abnormality is noted.  IMPRESSION: Left basilar atelectasis/ early infiltrate.   Electronically Signed   By: Alcide Clever M.D.   On: 08/26/2015 07:44   I have personally reviewed and evaluated these images  and lab results as part of my medical decision-making.   EKG Interpretation   Date/Time:  Friday August 26 2015 07:12:56 EDT Ventricular Rate:  134 PR Interval:  158 QRS Duration: 83 QT Interval:  284 QTC Calculation: 424 R Axis:   74 Text Interpretation:  Sinus tachycardia Baseline wander in lead(s) V1 V3  V4 V5 rate increased since prior ecg Confirmed by Nefertari Rebman  MD, Alonda Weaber  ([id number]) on 08/26/2015 7:36:35 AM      MDM   Final diagnoses:  Status asthmaticus, mild persistent   7:37 AM Will monitor closely. Epi given, additional mag, additional b agonist at this time.  7:51 AM Patient still unable to speak but she is beginning to improve.  ABG pending.  Labs pending.  X-ray personally reviewed by myself appears to be poor respiratory film.  No obvious infiltrate.  No pneumothorax.  Cardiac silhouette is within normal limits  8:14 AM Spoke with PCCM who agrees with initiating bipap at this time. Will come and evaluate at this time and plan for ICU admission    Azalia Bilis, MD 08/26/15 [phone]

## 2015-08-26 NOTE — Progress Notes (Addendum)
RT was called to beside and patient was on [place] at 4 L and placed back on BiPAP with the following settings: 10/+4/40%. Patient was SOB and in distress.  After BiPAP was placed back on patient, patient was more comfortable and WOB decreased. RT will continue to monitor patient.

## 2015-08-26 NOTE — ED Notes (Addendum)
07:57 AM 2nd IV started #20g right hand.  Per pt's request, mom Mickle Mallory [phone]) notified of admission.   8:44 AM  Admission bed assigned. Awaiting Intensivist consult prior to sending pt. Pt with increase air exchange, states her SOB has improved from 10/10 to 5/10. Continues with moderately labored respirations, tripoding, tachypneic 40/min and shallow respirations. States BiPap is helping. RT at bedside.

## 2015-08-27 LAB — CBC
HCT: 38.1 % (ref 36.0–46.0)
HEMOGLOBIN: 12.5 g/dL (ref 12.0–15.0)
MCH: 28.3 pg (ref 26.0–34.0)
MCHC: 32.8 g/dL (ref 30.0–36.0)
MCV: 86.4 fL (ref 78.0–100.0)
PLATELETS: 178 10*3/uL (ref 150–400)
RBC: 4.41 MIL/uL (ref 3.87–5.11)
RDW: 14 % (ref 11.5–15.5)
WBC: 9 10*3/uL (ref 4.0–10.5)

## 2015-08-27 LAB — BASIC METABOLIC PANEL
Anion gap: 6 (ref 5–15)
BUN: 9 mg/dL (ref 6–20)
CALCIUM: 8.9 mg/dL (ref 8.9–10.3)
CHLORIDE: 112 mmol/L — AB (ref 101–111)
CO2: 22 mmol/L (ref 22–32)
CREATININE: 0.57 mg/dL (ref 0.44–1.00)
Glucose, Bld: 146 mg/dL — ABNORMAL HIGH (ref 65–99)
Potassium: 4 mmol/L (ref 3.5–5.1)
SODIUM: 140 mmol/L (ref 135–145)

## 2015-08-27 LAB — PHOSPHORUS: PHOSPHORUS: 3.3 mg/dL (ref 2.5–4.6)

## 2015-08-27 LAB — MAGNESIUM: MAGNESIUM: 2.1 mg/dL (ref 1.7–2.4)

## 2015-08-27 MED ORDER — METHYLPREDNISOLONE SODIUM SUCC 40 MG IJ SOLR
40.0000 mg | Freq: Two times a day (BID) | INTRAMUSCULAR | Status: DC
  Administered 2015-08-27 – 2015-08-28 (×2): 40 mg via INTRAVENOUS
  Filled 2015-08-27 (×4): qty 1

## 2015-08-27 NOTE — Progress Notes (Signed)
Report called to Aggie Cosier, Charity fundraiser.  Patient transferring via wheelchair to room 1509.

## 2015-08-27 NOTE — Progress Notes (Addendum)
PULMONARY / CRITICAL CARE MEDICINE   Name: KORTNE ALL MRN: [id number] DOB: 1986/11/17    ADMISSION DATE:  08/26/2015 CONSULTATION DATE:  08/26/2015  REFERRING MD :  Dr. Patria Mane, ER physician  CHIEF COMPLAINT:  Shortness of breath  INITIAL PRESENTATION: 29 year old female with a past medical history significant for asthma came to the [facility] emergency department on 08/26/2015 complaining of shortness of breath, felt to be in status asthmaticus.  STUDIES:    SIGNIFICANT EVENTS: 8/27- improved resp status, off NIMV  SUBJECTIVE: off NIMV, no distress  VITAL SIGNS: Temp:  [97.6 F (36.4 C)-98.8 F (37.1 C)] 97.6 F (36.4 C) (08/27 0750) Pulse Rate:  [60-136] 67 (08/27 0800) Resp:  [17-36] 23 (08/27 0800) BP: (105-181)/(62-90) 130/78 mmHg (08/27 0800) SpO2:  [97 %-100 %] 97 % (08/27 0838) FiO2 (%):  [30 %-40 %] 30 % (08/27 0700) Weight:  [91.4 kg (201 lb 8 oz)] 91.4 kg (201 lb 8 oz) (08/26 0950) HEMODYNAMICS:   VENTILATOR SETTINGS: Vent Mode:  [-]  FiO2 (%):  [30 %-40 %] 30 % INTAKE / OUTPUT:  Intake/Output Summary (Last 24 hours) at 08/27/15 0915 Last data filed at 08/27/15 0900  Gross per 24 hour  Intake    195 ml  Output   1250 ml  Net  -1055 ml    PHYSICAL EXAMINATION: General:  Resting comfortably off NIMV Neuro:  Awake alert, moves all 4 extremities HEENT:  NCAT, jvd wnl Cardiovascular:  s1 s2 RRR Lungs:  Reduced, clear Abdomen:  Bowel sounds positive, soft nontender Musculoskeletal:  Normal and tone, no deformities Skin:  No rash or skin breakdown, trace edema in ankles  LABS:  CBC  Recent Labs Lab 08/26/15 0815 08/27/15 0342  WBC 9.9 9.0  HGB 13.4 12.5  HCT 40.1 38.1  PLT 192 178   Coag's No results for input(s): APTT, INR in the last 168 hours. BMET  Recent Labs Lab 08/26/15 0815 08/26/15 2025 08/27/15 0342  NA 136 139 140  K 2.9* 4.2 4.0  CL 105 111 112*  CO2 20* 22 22  BUN CREATININE 0.89 0.62 0.57  GLUCOSE  220* 150* 146*   Electrolytes  Recent Labs Lab 08/26/15 0815 08/26/15 2025 08/27/15 0342  CALCIUM 8.7* 8.8* 8.9  MG  --  2.1 2.1  PHOS  --   --  3.3   Sepsis Markers  Recent Labs Lab 08/26/15 0745  LATICACIDVEN 2.2*   ABG  Recent Labs Lab 08/26/15 0737  PHART 7.417  PCO2ART 28.7*  PO2ART 174*   Liver Enzymes No results for input(s): AST, ALT, ALKPHOS, BILITOT, ALBUMIN in the last 168 hours. Cardiac Enzymes  Recent Labs Lab 08/26/15 0815  TROPONINI <0.03   Glucose No results for input(s): GLUCAP in the last 168 hours.  Imaging No results found.   ASSESSMENT / PLAN:  PULMONARY A:Status asthmaticus: IMproved Unclear etiology  P:   BiPAP can dc Budesonide nebulizer twice a day, in am to symbicort Solu-Medrol 80 mg IV can reduce Albuterol every 4 scheduled, maintain, in am reduction monitor O2 saturation closely No repeat pcxr required Allergy assessment as outpt?  CARDIOVASCULAR A:  History of hypertension Questionable history of coronary artery disease, no documented evidence of that at our facility 8/27 BP wnl P:  Hydralazine when necessary systolic blood pressure greater than 160 Avoid BB for now, may need to alter off labetalol as home med noted Continue aspirin  RENAL A:   Mild lactic acidosis secondary to  asthma, hyperchloremia from saline P:   Monitor BMET in am Avoid saline  GASTROINTESTINAL A:   No acute issues P:   Regular diet start this am  HEMATOLOGIC A:   No acute issues P:   Monitor for bleeding lovenox until ambulation  INFECTIOUS A:   Asthma exacerbation, no clear evidence of infection P:   Rapid resolution making infectious etiology less likely Dc doxy She described a fever on tues, may have have viral illness, has 3 kids under the age of 44  ENDOCRINE A:  No acute issues P:   Monitor blood glucose Reduce steroids  NEUROLOGIC A:   No acute issues P:   Monitor neuro status in ICU   FAMILY  - Updates:   none available  May be able to move to floor in evening  Issues: Needs maintenance inhaled steroid Needs follow up allergy evaluation, had "years ago" May have had viral illness on tues-wed  Mcarthur Rossetti. Tyson Alias, MD, FACP Pgr: [phone] [facility]

## 2015-08-27 NOTE — Progress Notes (Signed)
Patient is not showing any signs of respiratory distress at this time. Vitals are stable (HR 91 RR 14 SPO2 98%) Did not place Bipap on at this time. RT will continue to monitor.

## 2015-08-28 DIAGNOSIS — D573 Sickle-cell trait: Secondary | ICD-10-CM

## 2015-08-28 DIAGNOSIS — J4552 Severe persistent asthma with status asthmaticus: Principal | ICD-10-CM

## 2015-08-28 LAB — BASIC METABOLIC PANEL
ANION GAP: 6 (ref 5–15)
BUN: 16 mg/dL (ref 6–20)
CALCIUM: 8.9 mg/dL (ref 8.9–10.3)
CO2: 23 mmol/L (ref 22–32)
Chloride: 111 mmol/L (ref 101–111)
Creatinine, Ser: 0.86 mg/dL (ref 0.44–1.00)
Glucose, Bld: 141 mg/dL — ABNORMAL HIGH (ref 65–99)
Potassium: 4.7 mmol/L (ref 3.5–5.1)
Sodium: 140 mmol/L (ref 135–145)

## 2015-08-28 MED ORDER — ALBUTEROL SULFATE (2.5 MG/3ML) 0.083% IN NEBU
2.5000 mg | INHALATION_SOLUTION | Freq: Four times a day (QID) | RESPIRATORY_TRACT | Status: DC
  Administered 2015-08-28 – 2015-09-01 (×17): 2.5 mg via RESPIRATORY_TRACT
  Filled 2015-08-28 (×17): qty 3

## 2015-08-28 MED ORDER — FLUTICASONE PROPIONATE 50 MCG/ACT NA SUSP
2.0000 | Freq: Every day | NASAL | Status: DC
  Administered 2015-08-28 – 2015-09-01 (×5): 2 via NASAL
  Filled 2015-08-28 (×2): qty 16

## 2015-08-28 MED ORDER — PREDNISONE 20 MG PO TABS
40.0000 mg | ORAL_TABLET | Freq: Every day | ORAL | Status: DC
Start: 2015-08-28 — End: 2015-08-30
  Administered 2015-08-28 – 2015-08-30 (×3): 40 mg via ORAL
  Filled 2015-08-28 (×4): qty 2

## 2015-08-28 MED ORDER — MOMETASONE FURO-FORMOTEROL FUM 200-5 MCG/ACT IN AERO
2.0000 | INHALATION_SPRAY | Freq: Two times a day (BID) | RESPIRATORY_TRACT | Status: DC
  Administered 2015-08-28 – 2015-09-01 (×9): 2 via RESPIRATORY_TRACT
  Filled 2015-08-28: qty 8.8

## 2015-08-28 NOTE — Progress Notes (Signed)
PULMONARY / CRITICAL CARE MEDICINE   Name: Melissa [name] MRN: [id number] DOB: Sep 18, 1986    ADMISSION DATE:  08/26/2015 CONSULTATION DATE:  08/26/2015  REFERRING MD :  Dr. Patria Mane, ER physician  CHIEF COMPLAINT:  Shortness of breath  INITIAL PRESENTATION: 29 year old female with a past medical history significant for asthma came to the [facility] emergency department on 08/26/2015 complaining of shortness of breath, felt to be in status asthmaticus.   SUBJECTIVE: Overall much improved. Still with a cough   VITAL SIGNS: Temp:  [97.7 F (36.5 C)-98.3 F (36.8 C)] 98.1 F (36.7 C) (08/28 0531) Pulse Rate:  [63-112] 63 (08/28 0531) Resp:  [18-24] 18 (08/28 0531) BP: (115-140)/(77-86) 138/86 mmHg (08/28 0531) SpO2:  [96 %-100 %] 100 % (08/28 0531) Weight:  [89.2 kg (196 lb 10.4 oz)-89.5 kg (197 lb 5 oz)] 89.5 kg (197 lb 5 oz) (08/28 0531)       INTAKE / OUTPUT:  Intake/Output Summary (Last 24 hours) at 08/28/15 0809 Last data filed at 08/27/15 1852  Gross per 24 hour  Intake    430 ml  Output    500 ml  Net    -70 ml    PHYSICAL EXAMINATION: General:  Resting comfortably  Neuro:  Awake alert, moves all 4 extremities HEENT:  NCAT, jvd wnl Cardiovascular:  s1 s2 RRR Lungs:  Reduced, clear, some pseudowheeze Abdomen:  Bowel sounds positive, soft nontender Musculoskeletal:  Normal and tone, no deformities Skin:  No rash or skin breakdown, trace edema in ankles  LABS:  CBC  Recent Labs Lab 08/26/15 0815 08/27/15 0342  WBC 9.9 9.0  HGB 13.4 12.5  HCT 40.1 38.1  PLT 192 178   Coag's No results for input(s): APTT, INR in the last 168 hours. BMET  Recent Labs Lab 08/26/15 2025 08/27/15 0342 08/28/15 0542  NA 139 140 140  K 4.2 4.0 4.7  CL 111 112* 111  CO2 BUN CREATININE 0.62 0.57 0.86  GLUCOSE 150* 146* 141*   Electrolytes  Recent Labs Lab 08/26/15 2025 08/27/15 0342 08/28/15 0542  CALCIUM 8.8* 8.9 8.9  MG 2.1 2.1   --   PHOS  --  3.3  --    Sepsis Markers  Recent Labs Lab 08/26/15 0745  LATICACIDVEN 2.2*   ABG  Recent Labs Lab 08/26/15 0737  PHART 7.417  PCO2ART 28.7*  PO2ART 174*   Liver Enzymes No results for input(s): AST, ALT, ALKPHOS, BILITOT, ALBUMIN in the last 168 hours. Cardiac Enzymes  Recent Labs Lab 08/26/15 0815  TROPONINI <0.03   Glucose No results for input(s): GLUCAP in the last 168 hours.  Imaging No results found.   ASSESSMENT / PLAN: Principal Problem:   Severe persistent asthma with allergic rhinitis with status asthmaticus Active Problems:   Sickle cell trait   PULMONARY A:Status asthmaticus: IMproved  P:   Change to prednisone  daily Add dulera 200 two puff bid Add flonase nasal spray chk igE with am labs Prn nebs and albuterol q6h scheduled Allergy assessment as outpt  CARDIOVASCULAR A:  History of hypertension Questionable history of coronary artery disease, no documented evidence of that at our facility 8/27 BP wnl P:  Hydralazine when necessary systolic blood pressure greater than 160 Avoid BB for now, may need to alter off labetalol as home med noted Continue aspirin  RENAL A:   improved  P:   Monitor BMET in am Avoid saline  GASTROINTESTINAL A:  No acute issues P:   Regular diet  HEMATOLOGIC A:  [place] trait Hgb stable P:   Monitor for bleeding lovenox until ambulation  INFECTIOUS A:   Asthma exacerbation, no clear evidence of infection P:   Rapid resolution making infectious etiology less likely Dc doxy She described a fever on tues, may have have viral illness, has 3 kids under the age of 73  ENDOCRINE A:  No acute issues P:   Monitor blood glucose Reduce steroids  NEUROLOGIC A:   No acute issues P:   Monitor neuro status in ICU   FAMILY  - Updates:  Father at bedside, updated fully   Will insure outpt f/u with pulm and allergy.  prob d/c in 24hrs   [name] Beeper  [phone]   Cell  [phone]  If no response or cell goes to voicemail, call beeper [phone] 08/28/2015 8:12 AM

## 2015-08-29 MED ORDER — AMLODIPINE BESYLATE 5 MG PO TABS: 5.0000 mg | ORAL_TABLET | Freq: Every day | ORAL | Status: DC

## 2015-08-29 MED ORDER — MOMETASONE FURO-FORMOTEROL FUM 200-5 MCG/ACT IN AERO: 2.0000 | INHALATION_SPRAY | Freq: Two times a day (BID) | RESPIRATORY_TRACT | Status: DC

## 2015-08-29 MED ORDER — AMLODIPINE BESYLATE 5 MG PO TABS
5.0000 mg | ORAL_TABLET | Freq: Every day | ORAL | Status: DC
Start: 2015-08-29 — End: 2015-09-01
  Administered 2015-08-29 – 2015-09-01 (×4): 5 mg via ORAL
  Filled 2015-08-29 (×4): qty 1

## 2015-08-29 MED ORDER — ALBUTEROL SULFATE HFA 108 (90 BASE) MCG/ACT IN AERS: 2.0000 | INHALATION_SPRAY | RESPIRATORY_TRACT | Status: DC | PRN

## 2015-08-29 MED ORDER — FLUTICASONE PROPIONATE 50 MCG/ACT NA SUSP: 2.0000 | Freq: Every day | NASAL | Status: DC

## 2015-08-29 MED ORDER — PREDNISONE 10 MG PO TABS: ORAL_TABLET | ORAL | Status: DC

## 2015-08-29 NOTE — Progress Notes (Signed)
Mild wheezing on exertion-otherwise much improved Stable for discharge today-no trigger evident  Recommend- Discharge and prednisone slow taper over 2 weeks Dulera-needs prescription Albuterol-MDI-needs prescription Outpatient follow-up in 1-2 week with TP Labetalol not the best choice for hypertension- can use amlodipine instead  Oretha Milch. MD

## 2015-08-29 NOTE — Discharge Summary (Signed)
Physician Discharge Summary       Patient ID: Melissa [name] MRN: [id number] DOB/AGE: [birth year] 29 y.o.  Admit date: 08/26/2015 Discharge date: 09/01/2015  Discharge Diagnoses:   Status asthmaticus  Allergic rhinitis  Severe persistent asthma Sickle cell trait  HTN   Detailed Hospital Course:   This is a pleasant 29 year old nonsmoking female with a history of asthma who came to the [facility] emergency department on 08/26/2015 complaining of 3 days of shortness of breath. She says that she had no sick contacts but developed the onset of shortness of breath, substernal chest pain, and a dry cough 3 days prior to admission. She had been using albuterol at home. She has never had a severe asthma exacerbation in the past. She denied fevers or chills. She had had a slight amount of leg swelling that had not changed recently. She came to [facility] in the morning and was noted to be profoundly short of breath with very little air movement. She was treated by EMS en route with Solu-Medrol, magnesium, and albuterol. In the [facility] emergency department she was given more Solu-Medrol and magnesium, as well as epinephrine. She was treated with BiPAP. By the time of her arrival her shortness of breath had significantly improved.  She was admitted initially to the intensive care w/ working dx of status asthmaticus. Treatment included: NIPPV, systemic steroids and scheduled BDs. She continued to improve over the course of her stay. On 8/28 BD treatment was stepped down ans steroid taper continued. Nasal hygiene regimen started. Felt ready for d/c as of 8/29 w/ following plan of care per active issues    Discharge Plan by active problems  Status asthmaticus  Plan:  prednisone slow taper over 2 weeks Dulera Albuterol-MDI-PRN F/u sept 20th  History of hypertension Plan:  Change antihypertensive to norvasc instead of labetolol    Significant Hospital tests/ studies  Consults   Discharge  Exam: BP 135/77 mmHg  Pulse 95  Temp(Src) 98.2 F (36.8 C) (Oral)  Resp 18  Ht  (1.651 m)  Wt 92.8 kg (204 lb 9.4 oz)  BMI 34.05 kg/m2  SpO2 98%  LMP 07/22/2015 (Exact Date)  Room air General: Resting comfortably  Neuro: Awake alert, moves all 4 extremities HEENT: NCAT, jvd wnl Cardiovascular: s1 s2 RRR Lungs: Reduced, clear, upper airway wheeze gone  Abdomen: Bowel sounds positive, soft nontender Musculoskeletal: Normal and tone, no deformities Skin: No rash or skin breakdown, trace edema in ankles Labs at discharge Lab Results  Component Value Date   CREATININE 0.86 08/28/2015   BUN 16 08/28/2015   NA 140 08/28/2015   K 4.7 08/28/2015   CL 111 08/28/2015   CO2 23 08/28/2015   Lab Results  Component Value Date   WBC 9.0 08/27/2015   HGB 12.5 08/27/2015   HCT 38.1 08/27/2015   MCV 86.4 08/27/2015   PLT 178 08/27/2015   Lab Results  Component Value Date   ALT 10 11/29/2014   AST 20 11/29/2014   ALKPHOS 160* 11/29/2014   BILITOT 0.6 11/29/2014   Lab Results  Component Value Date   INR 1.02 07/08/2010    Current radiology studies Dg Chest Port 1 View  08/30/2015   CLINICAL DATA:  Shortness of breath and dry cough since Sunday. Asthma attack on Sunday.  EXAM: PORTABLE CHEST - 1 VIEW  COMPARISON:  Chest x-ray dated 08/26/2015.  FINDINGS: Study is hypoinspiratory with crowding of the perihilar and bibasilar bronchovascular markings. Given the low  lung volumes, lungs appear clear. No confluent airspace opacity to suggest a consolidating pneumonia. No pleural effusion. No pneumothorax.  Heart size is normal. Mediastinal contours are normal in configuration. No osseous abnormality.  IMPRESSION: Hypoinspiratory exam with low lung volumes. No evidence of acute cardiopulmonary abnormality.   Electronically Signed   By: Bary Richard M.D.   On: 08/30/2015 15:10    Disposition:  01-Home or Self Care      Discharge Instructions    Diet - low sodium heart  healthy    Complete by:  As directed      Diet - low sodium heart healthy    Complete by:  As directed      Discharge instructions    Complete by:  As directed   May return to work after follow up on 9/8.     Increase activity slowly    Complete by:  As directed             Medication List    STOP taking these medications        labetalol 100 MG tablet  Commonly known as:  NORMODYNE      TAKE these medications        albuterol 108 (90 BASE) MCG/ACT inhaler  Commonly known as:  PROVENTIL HFA;VENTOLIN HFA  Inhale 2 puffs into the lungs every 4 (four) hours as needed for wheezing or shortness of breath.     albuterol (2.5 MG/3ML) 0.083% nebulizer solution  Commonly known as:  PROVENTIL  Take 2.5 mg by nebulization every 4 (four) hours as needed for wheezing or shortness of breath.     amLODipine 5 MG tablet  Commonly known as:  NORVASC  Take 1 tablet (5 mg total) by mouth daily.     aspirin EC 81 MG tablet  Take 81 mg by mouth daily.     ferrous sulfate 325 (65 FE) MG tablet  Take 1 tablet (325 mg total) by mouth 2 (two) times daily with a meal.     fexofenadine 180 MG tablet  Commonly known as:  ALLEGRA  Take 180 mg by mouth daily as needed for allergies or rhinitis.     fluticasone 50 MCG/ACT nasal spray  Commonly known as:  FLONASE  Place 2 sprays into both nostrils daily.     ibuprofen 200 MG tablet  Commonly known as:  ADVIL,MOTRIN  Take 200 mg by mouth every 6 (six) hours as needed for headache, mild pain or moderate pain.     mometasone-formoterol 200-5 MCG/ACT Aero  Commonly known as:  DULERA  Inhale 2 puffs into the lungs 2 (two) times daily.     NEXPLANON 68 MG Impl implant  Generic drug:  etonogestrel  1 each by Subdermal route once.     omeprazole 20 MG tablet  Commonly known as:  PRILOSEC OTC  Take 1 tablet (20 mg total) by mouth daily.     predniSONE 10 MG tablet  Commonly known as:  DELTASONE  Take 4 tabs  daily with food x 4 days, then 3  tabs daily x 4 days, then 2 tabs daily x 4 days, then 1 tab daily x4 days then stop. #40     promethazine-codeine 6.25-10 MG/5ML syrup  Commonly known as:  PHENERGAN with CODEINE  Take 5 mLs by mouth every 4 (four) hours as needed for cough.     rizatriptan 5 MG disintegrating tablet  Commonly known as:  MAXALT-MLT  Take 1 tablet (5 mg total) by  mouth as needed. May repeat in 2 hours if needed       Follow-up Information    Follow up with [facility], NP On 09/08/2015.   Specialty:  Nurse Practitioner   Why:  [id number]   Contact information:   [address] [place] [address] [phone]       Discharged Condition: good  Physician Statement:   The Patient was personally examined, the discharge assessment and plan has been personally reviewed and I agree with ACNP Babcock's assessment and plan. > 30 minutes of time have been dedicated to discharge assessment, planning and discharge instructions.   Signed: BABCOCK,PETE 09/01/2015, 11:41 AM

## 2015-08-29 NOTE — Progress Notes (Signed)
PULMONARY / CRITICAL CARE MEDICINE   Name: Melissa [name] MRN: [id number] DOB: 08/07/1986    ADMISSION DATE:  08/26/2015 CONSULTATION DATE:  08/26/2015  REFERRING MD :  Dr. Patria Mane, ER physician  CHIEF COMPLAINT:  Shortness of breath  INITIAL PRESENTATION: 29 year old female with a past medical history significant for asthma came to the [facility] emergency department on 08/26/2015 complaining of shortness of breath, felt to be in status asthmaticus.   SUBJECTIVE: Overall much improved. Still with a cough; got very short of breath and light headed when walking  VITAL SIGNS: Temp:  [97.9 F (36.6 C)-98.6 F (37 C)] 97.9 F (36.6 C) (08/29 0513) Pulse Rate:  [56-89] 56 (08/29 0513) Resp:  [18-20] 20 (08/29 0513) BP: (122-142)/(75-88) 142/83 mmHg (08/29 0513) SpO2:  [97 %-100 %] 98 % (08/29 0847) Weight:  [90.6 kg (199 lb 11.8 oz)] 90.6 kg (199 lb 11.8 oz) (08/29 0513) Room air       INTAKE / OUTPUT:  Intake/Output Summary (Last 24 hours) at 08/29/15 1342 Last data filed at 08/29/15 1038  Gross per 24 hour  Intake    480 ml  Output   1152 ml  Net   -672 ml    PHYSICAL EXAMINATION: General:  Resting comfortably  Neuro:  Awake alert, moves all 4 extremities HEENT:  NCAT, jvd wnl Cardiovascular:  s1 s2 RRR Lungs:  Reduced, clear Abdomen:  Bowel sounds positive, soft nontender Musculoskeletal:  Normal and tone, no deformities Skin:  No rash or skin breakdown, trace edema in ankles  LABS:  CBC  Recent Labs Lab 08/26/15 0815 08/27/15 0342  WBC 9.9 9.0  HGB 13.4 12.5  HCT 40.1 38.1  PLT 192 178   Coag's No results for input(s): APTT, INR in the last 168 hours. BMET  Recent Labs Lab 08/26/15 2025 08/27/15 0342 08/28/15 0542  NA 139 140 140  K 4.2 4.0 4.7  CL 111 112* 111  CO2 22 22 23   BUN 7 9 16   CREATININE 0.62 0.57 0.86  GLUCOSE 150* 146* 141*   Electrolytes  Recent Labs Lab 08/26/15 2025 08/27/15 0342 08/28/15 0542  CALCIUM 8.8*  8.9 8.9  MG 2.1 2.1  --   PHOS  --  3.3  --    Sepsis Markers  Recent Labs Lab 08/26/15 0745  LATICACIDVEN 2.2*   ABG  Recent Labs Lab 08/26/15 0737  PHART 7.417  PCO2ART 28.7*  PO2ART 174*   Liver Enzymes No results for input(s): AST, ALT, ALKPHOS, BILITOT, ALBUMIN in the last 168 hours. Cardiac Enzymes  Recent Labs Lab 08/26/15 0815  TROPONINI <0.03   Glucose No results for input(s): GLUCAP in the last 168 hours.  Imaging No results found.   ASSESSMENT / PLAN: Principal Problem:   Severe persistent asthma with allergic rhinitis with status asthmaticus Active Problems:   Sickle cell trait   Status asthmaticus: Improved  Plan:   dulera and PRN albuterol  pred taper F/u our office   History of hypertension Plan Start norvasc   Deconditioning after acute illness Plan PT consult   Not ready for home  Simonne Martinet ACNP-BC [facility] Pulmonary/Critical Care Pager # [phone] OR # [phone] if no answer\

## 2015-08-30 ENCOUNTER — Inpatient Hospital Stay (HOSPITAL_COMMUNITY): Payer: Managed Care, Other (non HMO)

## 2015-08-30 DIAGNOSIS — J4532 Mild persistent asthma with status asthmaticus: Secondary | ICD-10-CM

## 2015-08-30 LAB — IGE: IgE (Immunoglobulin E), Serum: 60 IU/mL (ref 0–100)

## 2015-08-30 MED ORDER — PANTOPRAZOLE SODIUM 40 MG PO TBEC
40.0000 mg | DELAYED_RELEASE_TABLET | Freq: Two times a day (BID) | ORAL | Status: DC
  Administered 2015-08-30 – 2015-09-01 (×4): 40 mg via ORAL
  Filled 2015-08-30 (×6): qty 1

## 2015-08-30 MED ORDER — METHYLPREDNISOLONE SODIUM SUCC 40 MG IJ SOLR
40.0000 mg | Freq: Two times a day (BID) | INTRAMUSCULAR | Status: DC
  Administered 2015-08-30 – 2015-09-01 (×4): 40 mg via INTRAVENOUS
  Filled 2015-08-30 (×6): qty 1

## 2015-08-30 MED ORDER — PROMETHAZINE-CODEINE 6.25-10 MG/5ML PO SYRP
5.0000 mL | ORAL_SOLUTION | Freq: Four times a day (QID) | ORAL | Status: DC
  Administered 2015-08-30 – 2015-09-01 (×8): 5 mL via ORAL
  Filled 2015-08-30 (×8): qty 5

## 2015-08-30 NOTE — Evaluation (Signed)
Physical Therapy Evaluation Patient Details Name: Melissa [name] MRN: [id number] DOB: 04/04/1986 Today's Date: 08/30/2015   History of Present Illness  29 year old female with a past medical history significant for asthma admitted 08/26/2015 complaining of shortness of breath, felt to be in status asthmaticus.  Clinical Impression  Pt admitted with above diagnosis. Pt currently with functional limitations due to the deficits listed below (see PT Problem List).  Pt will benefit from skilled PT to increase their independence and safety with mobility to allow discharge to the venue listed below.  Pt assisted with short distance ambulation today however reporting dizziness requiring recliner and rest break before returning to room.  Pt will likely progress to home with no f/u PT.     Follow Up Recommendations No PT follow up    Equipment Recommendations  Rolling walker with 5" wheels (TBA - required RW today)    Recommendations for Other Services       Precautions / Restrictions Precautions Precautions: None      Mobility  Bed Mobility               General bed mobility comments: pt up in recliner on arrival  Transfers Overall transfer level: Needs assistance Equipment used: Rolling walker (2 wheeled) Transfers: Sit to/from Stand Sit to Stand: Min guard         General transfer comment: verbal cues for hand placement  Ambulation/Gait Ambulation/Gait assistance: Min guard Ambulation Distance (Feet): 20 Feet (x2) Assistive device: Rolling walker (2 wheeled) Gait Pattern/deviations: Step-through pattern     General Gait Details: increased reliance on UEs through RW, very slow pace, pt reported dizziness after 20 feet and requesting recliner for rest break, vitals: 137/96 mmHg, 99% SpO2 room air, 107 bpm, pt ambulated back to room  Stairs            Wheelchair Mobility    Modified Rankin (Stroke Patients Only)       Balance                                              Pertinent Vitals/Pain Pain Assessment: 0-10 Pain Score:  (not rated, RN brought meds ) Pain Location: head Pain Descriptors / Indicators: Aching Pain Intervention(s): Repositioned;Limited activity within patient's tolerance;Monitored during session;RN gave pain meds during session    Home Living Family/patient expects to be discharged to:: Private residence Living Arrangements: Children;Parent   Type of Home: House       Home Layout: Two level Home Equipment: None      Prior Function Level of Independence: Independent         Comments: 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, also works     Higher education careers adviser        Extremity/Trunk Assessment               Lower Extremity Assessment: Generalized weakness         Communication   Communication: No difficulties  Cognition Arousal/Alertness: Awake/alert Behavior During Therapy: WFL for tasks assessed/performed Overall Cognitive Status: Within Functional Limits for tasks assessed                      General Comments      Exercises        Assessment/Plan    PT Assessment Patient needs continued PT services  PT Diagnosis Difficulty walking   PT Problem  List Decreased strength;Decreased activity tolerance;Decreased mobility;Decreased knowledge of use of DME  PT Treatment Interventions DME instruction;Gait training;Stair training;Functional mobility training;Patient/family education;Therapeutic activities;Therapeutic exercise   PT Goals (Current goals can be found in the Care Plan section) Acute Rehab PT Goals Patient Stated Goal: back home as soon as possible PT Goal Formulation: With patient Time For Goal Achievement: 09/06/15 Potential to Achieve Goals: Good    Frequency Min 3X/week   Barriers to discharge        Co-evaluation               End of Session   Activity Tolerance: Patient limited by fatigue Patient left: in chair;with call bell/phone within reach Nurse  Communication: Mobility status         Time: 1610-9604 PT Time Calculation (min) (ACUTE ONLY): 17 min   Charges:   PT Evaluation $Initial PT Evaluation Tier I: 1 Procedure     PT G Codes:        Cantrell Martus,KATHrine E 08/30/2015, 12:07 PM Zenovia Jarred, PT, DPT 08/30/2015 Pager: [phone]

## 2015-08-30 NOTE — Progress Notes (Signed)
Patient ambulated in hallway 15 feet using walker. Slow steady gait. Patient weak. Up in chair at this time

## 2015-08-30 NOTE — Progress Notes (Signed)
PULMONARY / CRITICAL CARE MEDICINE   Name: Melissa [name] MRN: [id number] DOB: October 11, 1986    ADMISSION DATE:  08/26/2015 CONSULTATION DATE:  08/26/2015  REFERRING MD :  Dr. Patria Mane, ER physician  CHIEF COMPLAINT:  Shortness of breath  INITIAL PRESENTATION: 29 year old female with a past medical history significant for asthma came to the [facility] emergency department on 08/26/2015 complaining of shortness of breath, felt to be in status asthmaticus.   SUBJECTIVE: Overall much improved. Still with a cough; got very short of breath and light headed when walking still   VITAL SIGNS: Temp:  [98 F (36.7 C)-98.4 F (36.9 C)] 98.4 F (36.9 C) (08/30 0431) Pulse Rate:  [86-98] 98 (08/30 1126) Resp:  [19-20] 20 (08/30 0431) BP: (118-146)/(68-92) 129/83 mmHg (08/30 1126) SpO2:  [98 %-100 %] 98 % (08/30 1332) Weight:  [92.1 kg (203 lb 0.7 oz)] 92.1 kg (203 lb 0.7 oz) (08/30 0746) Room air       INTAKE / OUTPUT:  Intake/Output Summary (Last 24 hours) at 08/30/15 1440 Last data filed at 08/29/15 1921  Gross per 24 hour  Intake    240 ml  Output      0 ml  Net    240 ml    PHYSICAL EXAMINATION: General:  Resting comfortably  Neuro:  Awake alert, moves all 4 extremities HEENT:  NCAT, jvd wnl, marked upper airway wheeze Cardiovascular:  s1 s2 RRR Lungs:  Clear, + upper airway noises transmitted to chest after cough  Abdomen:  Bowel sounds positive, soft nontender Musculoskeletal:  Normal and tone, no deformities Skin:  No rash or skin breakdown, trace edema in ankles  LABS:  CBC  Recent Labs Lab 08/26/15 0815 08/27/15 0342  WBC 9.9 9.0  HGB 13.4 12.5  HCT 40.1 38.1  PLT 192 178   Coag's No results for input(s): APTT, INR in the last 168 hours. BMET  Recent Labs Lab 08/26/15 2025 08/27/15 0342 08/28/15 0542  NA 139 140 140  K 4.2 4.0 4.7  CL 111 112* 111  CO2 BUN CREATININE 0.62 0.57 0.86  GLUCOSE 150* 146* 141*    Electrolytes  Recent Labs Lab 08/26/15 2025 08/27/15 0342 08/28/15 0542  CALCIUM 8.8* 8.9 8.9  MG 2.1 2.1  --   PHOS  --  3.3  --    Sepsis Markers  Recent Labs Lab 08/26/15 0745  LATICACIDVEN 2.2*   ABG  Recent Labs Lab 08/26/15 0737  PHART 7.417  PCO2ART 28.7*  PO2ART 174*   Liver Enzymes No results for input(s): AST, ALT, ALKPHOS, BILITOT, ALBUMIN in the last 168 hours. Cardiac Enzymes  Recent Labs Lab 08/26/15 0815  TROPONINI <0.03   Glucose No results for input(s): GLUCAP in the last 168 hours.  Imaging No results found.   ASSESSMENT / PLAN: Principal Problem:   Severe persistent asthma with allergic rhinitis with status asthmaticus Active Problems:   Sickle cell trait   Status asthmaticus: Improved VCD/ LPR  >has significant upper airway wheeze, vocal hoarseness and persistent airway clearing/ irritation. No thrush, no obvious reflux but cough worse when recumbent. She is on good nasal regimen, only thing to add is empiric GERD rx and cough suppression to decrease ongoing upper airway irritation. She remains anxious about going home and still feels light headed after just ambulating 15 ft. I am not sure to what degree this is psychosomatic.   Plan:   Will repeat CXR  dulera and  PRN albuterol  pred taper Cont nasal steroid and antihistamine Add empiric PPI Will add cough suppression  Have encouraged reflux precautions and vocal rest  History of hypertension Plan Start norvasc   Deconditioning after acute illness Plan PT consulted Rolling walker for today  Not ready for home-->respiratory symptom burden still significant and likely would be re-admitted if discharged before feeling better.   Simonne Martinet ACNP-BC [facility] Pulmonary/Critical Care Pager # [phone] OR # [phone] if no answer\

## 2015-08-31 DIAGNOSIS — K219 Gastro-esophageal reflux disease without esophagitis: Secondary | ICD-10-CM | POA: Insufficient documentation

## 2015-08-31 DIAGNOSIS — R0600 Dyspnea, unspecified: Secondary | ICD-10-CM | POA: Insufficient documentation

## 2015-08-31 DIAGNOSIS — R5381 Other malaise: Secondary | ICD-10-CM | POA: Insufficient documentation

## 2015-08-31 NOTE — Progress Notes (Signed)
PULMONARY / CRITICAL CARE MEDICINE   Name: Melissa [name] MRN: [id number] DOB: Feb 02, 1986    ADMISSION DATE:  08/26/2015 CONSULTATION DATE:  08/26/2015  REFERRING MD :  Dr. Patria Mane, ER physician  CHIEF COMPLAINT:  Shortness of breath  INITIAL PRESENTATION: 29 year old female with a past medical history significant for asthma came to the [facility] emergency department on 08/26/2015 complaining of shortness of breath, felt to be in status asthmaticus.   SUBJECTIVE: Overall much improved. Still with a cough; got very short of breath and light headed when walking still   VITAL SIGNS: Temp:  [98.2 F (36.8 C)-98.5 F (36.9 C)] 98.2 F (36.8 C) (08/31 1446) Pulse Rate:  [90-106] 106 (08/31 1520) Resp:  [17-18] 17 (08/31 1520) BP: (121-132)/(76-88) 129/76 mmHg (08/31 1446) SpO2:  [98 %-100 %] 98 % (08/31 1520) Weight:  [92.8 kg (204 lb 9.4 oz)] 92.8 kg (204 lb 9.4 oz) (08/31 0629) Room air       INTAKE / OUTPUT:  Intake/Output Summary (Last 24 hours) at 08/31/15 1632 Last data filed at 08/31/15 1300  Gross per 24 hour  Intake    720 ml  Output      0 ml  Net    720 ml    PHYSICAL EXAMINATION: General:  Resting comfortably  Neuro:  Awake alert, moves all 4 extremities HEENT:  NCAT, jvd wnl, marked upper airway wheeze-->improved Cardiovascular:  s1 s2 RRR Lungs:  Clear, + upper airway noises transmitted to chest after cough  Abdomen:  Bowel sounds positive, soft nontender Musculoskeletal:  Normal and tone, no deformities Skin:  No rash or skin breakdown, trace edema in ankles  LABS:  CBC  Recent Labs Lab 08/26/15 0815 08/27/15 0342  WBC 9.9 9.0  HGB 13.4 12.5  HCT 40.1 38.1  PLT 192 178   Coag's No results for input(s): APTT, INR in the last 168 hours. BMET  Recent Labs Lab 08/26/15 2025 08/27/15 0342 08/28/15 0542  NA 139 140 140  K 4.2 4.0 4.7  CL 111 112* 111  CO2 BUN CREATININE 0.62 0.57 0.86  GLUCOSE 150* 146* 141*    Electrolytes  Recent Labs Lab 08/26/15 2025 08/27/15 0342 08/28/15 0542  CALCIUM 8.8* 8.9 8.9  MG 2.1 2.1  --   PHOS  --  3.3  --    Sepsis Markers  Recent Labs Lab 08/26/15 0745  LATICACIDVEN 2.2*   ABG  Recent Labs Lab 08/26/15 0737  PHART 7.417  PCO2ART 28.7*  PO2ART 174*   Liver Enzymes No results for input(s): AST, ALT, ALKPHOS, BILITOT, ALBUMIN in the last 168 hours. Cardiac Enzymes  Recent Labs Lab 08/26/15 0815  TROPONINI <0.03   Glucose No results for input(s): GLUCAP in the last 168 hours.  Imaging No results found.   ASSESSMENT / PLAN: Principal Problem:   Severe persistent asthma with allergic rhinitis with status asthmaticus Active Problems:   Sickle cell trait   Status asthmaticus: Improved VCD/ LPR  >had significant upper airway wheeze, vocal hoarseness improved.  Plan:    dulera and PRN albuterol  pred taper Cont nasal steroid and antihistamine Added empiric PPI Will cont cough suppression scheduled X over night then change to PRN Have encouraged reflux precautions and vocal rest  History of hypertension Plan Started norvasc   Deconditioning after acute illness Plan PT consulted Rolling walker for today PT thinks home PT not needed   Better, PT thinks needs one more day  function wise->respiratory symptom burden much improved. Home in AM  Simonne Martinet ACNP-BC [facility] Pulmonary/Critical Care Pager # [phone] OR # [phone] if no answer\

## 2015-08-31 NOTE — Progress Notes (Signed)
Physical Therapy Treatment Patient Details Name: Melissa [name] MRN: [id number] DOB: [birth year] Today's Date: 08/31/2015    History of Present Illness 29 year old female with a past medical history significant for asthma admitted 08/26/2015 complaining of shortness of breath, felt to be in status asthmaticus.    PT Comments    Pt able to ambulate to/from nursing station today with only one standing rest break however continues to require RW and slow pace despite encouragement to increase speed.  Pt seems cautious and hesitant about mobility however very willing to participate and did not require any physical assist. Pt asked about outpatient therapy and PT recommended seeking outpatient therapy only if once home, pt does not return to baseline, however feel pt will quickly progress to baseline once back in home environment with pt also in agreement.  Pt also encouraged to ambulate at least once more to nurses station if not more later today and NT also aware of this.   Follow Up Recommendations  No PT follow up;Outpatient PT     Equipment Recommendations  Rolling walker with 5" wheels    Recommendations for Other Services       Precautions / Restrictions Precautions Precautions: None    Mobility  Bed Mobility               General bed mobility comments: pt up in recliner on arrival  Transfers Overall transfer level: Needs assistance Equipment used: Rolling walker (2 wheeled) Transfers: Sit to/from Stand Sit to Stand: Supervision            Ambulation/Gait Ambulation/Gait assistance: Min guard;Supervision Ambulation Distance (Feet): 160 Feet Assistive device: Rolling walker (2 wheeled) Gait Pattern/deviations: Step-through pattern;Decreased stride length     General Gait Details: continues to demonstrate increased reliance on UEs through RW, very slow pace and encouraged to increased speed however pt unable to ambuate average gait velocity for her age, no  dizziness reported today however continues to report slight headache   Stairs            Wheelchair Mobility    Modified Rankin (Stroke Patients Only)       Balance                                    Cognition Arousal/Alertness: Awake/alert Behavior During Therapy: WFL for tasks assessed/performed Overall Cognitive Status: Within Functional Limits for tasks assessed                      Exercises      General Comments        Pertinent Vitals/Pain Pain Score: 3  Pain Location: head Pain Descriptors / Indicators: Aching Pain Intervention(s): Monitored during session    Home Living                      Prior Function            PT Goals (current goals can now be found in the care plan section) Progress towards PT goals: Progressing toward goals    Frequency  Min 3X/week    PT Plan Current plan remains appropriate    Co-evaluation             End of Session   Activity Tolerance: Patient tolerated treatment well Patient left: in chair;with call bell/phone within reach     Time: 1339-1357 PT Time Calculation (min) (ACUTE ONLY): 18  min  Charges:  $Gait Training: 8-22 mins                    G Codes:      Edra Riccardi,KATHrine E 09/09/15, 3:11 PM Zenovia Jarred, PT, DPT 09/09/15 Pager: [phone]

## 2015-09-01 DIAGNOSIS — J4552 Severe persistent asthma with status asthmaticus: Secondary | ICD-10-CM

## 2015-09-01 MED ORDER — OMEPRAZOLE MAGNESIUM 20 MG PO TBEC: 20.0000 mg | DELAYED_RELEASE_TABLET | Freq: Every day | ORAL | Status: DC

## 2015-09-01 MED ORDER — PROMETHAZINE-CODEINE 6.25-10 MG/5ML PO SYRP: 5.0000 mL | ORAL_SOLUTION | ORAL | Status: DC | PRN

## 2015-09-01 NOTE — Progress Notes (Signed)
To whom it may concern,   We have been caring for Melissa [name] here in the hospital at [facility]. She has had a prolonged stay (from 8/26 to 9/1)  and will need more time for recovery before I feel she can be released to resume her normal duties at work. I have scheduled her for follow up at our office next week W/ Rubye Oaks NP at [facility] on Wednesday the 8th. She should be ready to resume full duties at that time. We thank you for assisting [place] with the care of [name].    Simonne Martinet ACNP-BC [place] Pulmonary/Critical Care [phone]

## 2015-09-01 NOTE — Care Management Note (Signed)
Case Management Note  Patient Details  Name: Melissa [name] MRN: [id number] Date of Birth: 1986/03/01  Subjective/Objective:  MD did not order rw, & per nsg does not feel patient needs otpt PT.Went into patient's rm to discuss MD recommendation.  Removed otpt PT resource list, & [facility] dme rep Kristen-made aware of no order, for rw, & to pick up rw.Nsg/patient aware.                  Action/Plan:d/c home.No needs or orders.   Expected Discharge Date:   (UNKNOWN)               Expected Discharge Plan:  Home/Self Care  In-House Referral:     Discharge planning Services  CM Consult  Post Acute Care Choice:    Choice offered to:     DME Arranged:    DME Agency:     HH Arranged:    HH Agency:     Status of Service:  Completed, signed off  Medicare Important Message Given:    Date Medicare IM Given:    Medicare IM give by:    Date Additional Medicare IM Given:    Additional Medicare Important Message give by:     If discussed at Long Length of Stay Meetings, dates discussed:    Additional Comments:  Lanier Clam, RN 09/01/2015, 2:39 PM

## 2015-09-01 NOTE — Care Management Note (Signed)
Case Management Note  Patient Details  Name: Melissa [name] MRN: [id number] Date of Birth: Jul 10, 1986  Subjective/Objective:  PT-recc no f/u;outpatient PT;will provide otpt rehab resource list to patient.  Will need manual script w/dx, & otpt PT.Nsg notified.                  Action/Plan:d/c home.   Expected Discharge Date:   (UNKNOWN)               Expected Discharge Plan:  Home/Self Care  In-House Referral:     Discharge planning Services  CM Consult  Post Acute Care Choice:    Choice offered to:     DME Arranged:    DME Agency:     HH Arranged:    HH Agency:     Status of Service:  Completed, signed off  Medicare Important Message Given:    Date Medicare IM Given:    Medicare IM give by:    Date Additional Medicare IM Given:    Additional Medicare Important Message give by:     If discussed at Long Length of Stay Meetings, dates discussed:    Additional Comments:  Lanier Clam, RN 09/01/2015, 12:34 PM

## 2015-09-01 NOTE — Care Management Note (Signed)
Case Management Note  Patient Details  Name: [name] MRN: [id number] Date of Birth: [birth year]  Subjective/Objective: PT-recc rw. [facility] dme rep Baxter Hire aware of recc, & d/c.Nsg to get order for otpt PT, & rw. Patient aware of rw to be brought to rm.Otpt Pt resource list given.                  Action/Plan:d/c home w/DME.   Expected Discharge Date:   (UNKNOWN)               Expected Discharge Plan:  Home/Self Care  In-House Referral:     Discharge planning Services  CM Consult  Post Acute Care Choice:    Choice offered to:     DME Arranged:  Walker rolling DME Agency:  [facility]  HH Arranged:    HH Agency:     Status of Service:  Completed, signed off  Medicare Important Message Given:    Date Medicare IM Given:    Medicare IM give by:    Date Additional Medicare IM Given:    Additional Medicare Important Message give by:     If discussed at Long Length of Stay Meetings, dates discussed:    Additional Comments:  Lanier Clam, RN 09/01/2015, 12:49 PM

## 2015-09-08 ENCOUNTER — Other Ambulatory Visit (INDEPENDENT_AMBULATORY_CARE_PROVIDER_SITE_OTHER): Payer: Managed Care, Other (non HMO)

## 2015-09-08 ENCOUNTER — Encounter (INDEPENDENT_AMBULATORY_CARE_PROVIDER_SITE_OTHER): Payer: Self-pay

## 2015-09-08 ENCOUNTER — Inpatient Hospital Stay: Payer: Managed Care, Other (non HMO) | Admitting: Adult Health

## 2015-09-08 ENCOUNTER — Ambulatory Visit (INDEPENDENT_AMBULATORY_CARE_PROVIDER_SITE_OTHER): Payer: Managed Care, Other (non HMO) | Admitting: Internal Medicine

## 2015-09-08 ENCOUNTER — Ambulatory Visit (INDEPENDENT_AMBULATORY_CARE_PROVIDER_SITE_OTHER)
Admission: RE | Admit: 2015-09-08 | Discharge: 2015-09-08 | Disposition: A | Discharge status: Home / Self Care | Payer: Managed Care, Other (non HMO) | Source: Ambulatory Visit | Attending: Internal Medicine | Admitting: Internal Medicine

## 2015-09-08 ENCOUNTER — Encounter: Payer: Self-pay | Admitting: Internal Medicine

## 2015-09-08 VITALS — BP 108/70 | HR 101 | Ht 65.0 in | Wt 207.0 lb

## 2015-09-08 DIAGNOSIS — R0600 Dyspnea, unspecified: Secondary | ICD-10-CM | POA: Diagnosis not present

## 2015-09-08 DIAGNOSIS — E669 Obesity, unspecified: Secondary | ICD-10-CM | POA: Diagnosis not present

## 2015-09-08 LAB — D-DIMER, QUANTITATIVE (NOT AT ARMC): D DIMER QUANT: 0.41 ug{FEU}/mL (ref 0.00–0.48)

## 2015-09-08 LAB — TSH: TSH: 1.7 u[IU]/mL (ref 0.35–4.50)

## 2015-09-08 LAB — BRAIN NATRIURETIC PEPTIDE: PRO B NATRI PEPTIDE: 17 pg/mL (ref 0.0–100.0)

## 2015-09-08 MED ORDER — MONTELUKAST SODIUM 10 MG PO TABS: 10.0000 mg | ORAL_TABLET | Freq: Every day | ORAL | Status: DC

## 2015-09-08 NOTE — Progress Notes (Signed)
Subjective:     Patient ID: Melissa [name], female   DOB: 14-Mar-1986,    MRN: [id number]  HPI  53 yobf never smoker asthma as child freq missed school but rare ER/admit followed by Dr Stefan Church with good control thru HS on advair/ singulair and only needed albuterol with sports then in [place] [employer] and started requiring nebulizer then came to [place] various jobs did fine / stopped advair and just rx singulair in fall and fine for several years s need for albuterol then admit p ran out of singulair and started albuterol again 2 weeks pta:  Admit date: 08/26/2015 Discharge date: 09/01/2015  Discharge Diagnoses:   Status asthmaticus  Allergic rhinitis  Severe persistent asthma Sickle cell trait  HTN   Detailed Hospital Course:  This is a pleasant 29 year old nonsmoking female with a history of asthma who came to the [facility] emergency department on 08/26/2015 complaining of 3 days of shortness of breath. She says that she had no sick contacts but developed the onset of shortness of breath, substernal chest pain, and a dry cough 3 days prior to admission. She had been using albuterol at home. She has never had a severe asthma exacerbation in the past. She denied fevers or chills. She had had a slight amount of leg swelling that had not changed recently. She came to [facility] in the morning and was noted to be profoundly short of breath with very little air movement. She was treated by EMS en route with Solu-Medrol, magnesium, and albuterol. In the [facility] emergency department she was given more Solu-Medrol and magnesium, as well as epinephrine. She was treated with BiPAP. By the time of her arrival her shortness of breath had significantly improved. She was admitted initially to the intensive care w/ working dx of status asthmaticus. Treatment included: NIPPV, systemic steroids and scheduled BDs. She continued to improve over the course of her stay.  On 8/28 BD treatment was stepped down ans steroid taper continued. Nasal hygiene regimen started. Felt ready for d/c as of 8/29 w/ following plan of care per active issues   Discharge Plan by active problems  Status asthmaticus  Plan:  prednisone slow taper over 2 weeks Dulera Albuterol-MDI-PRN F/u sept 20th  History of hypertension Plan:  Change antihypertensive to norvasc instead of labetolol     09/08/2015 1st [facility] office visit/ Stuart Guillen   Chief Complaint  Patient presents with  . HFU    Pt states her breathing has not returned back to baseline. She is having occ wheezing and feels tightness and pressure in her chest.  She has minimal non prod cough.  She is using rescue inhaler 4-5 x per day and has used neb x 2 since hospital d/o.  now on norvasc instead of labetalol and  still tapering off prednisone. Ok at rest / doe x HT and hs and has to sit up all night smothering/ no cough.   No obvious day to day or daytime variability or assoc  cp or chest tightness, subjective wheeze or overt sinus or hb symptoms. No unusual exp hx or h/o childhood pna/ asthma or knowledge of premature birth.   Also denies any obvious fluctuation of symptoms with weather or environmental changes or other aggravating or alleviating factors except as outlined above   Current Medications, Allergies, Complete Past Medical History, Past Surgical History, Family History, and Social History were reviewed in [employer] record.  ROS  The following  are not active complaints unless bolded sore throat, dysphagia, dental problems, itching, sneezing,  nasal congestion or excess/ purulent secretions, ear ache,   fever, chills, sweats, unintended wt loss, classically pleuritic or exertional cp, hemoptysis,  orthopnea pnd or leg swelling, presyncope, palpitations, abdominal pain, anorexia, nausea, vomiting, diarrhea  or change in bowel or bladder habits, change in stools or urine,  dysuria,hematuria,  rash, arthralgias, visual complaints, headache, numbness, weakness or ataxia or problems with walking or coordination,  change in mood/affect or memory.          Review of Systems     Objective:   Physical Exam  amb bm nad  Wt Readings from Last 3 Encounters:  09/08/15 207 lb (93.895 kg)  09/01/15 204 lb 9.4 oz (92.8 kg)  08/10/15 197 lb (89.359 kg)    Vital signs reviewed   HEENT: nl dentition, turbinates, and orophanx. Nl external ear canals without cough reflex   NECK :  without JVD/Nodes/TM/ nl carotid upstrokes bilaterally   LUNGS: no acc muscle use, clear to A and P bilaterally without cough on insp or exp maneuvers   CV:  RRR  no s3 or murmur or increase in P2, no edema   ABD:  soft and nontender with nl excursion in the supine position. No bruits or organomegaly, bowel sounds nl  MS:  warm without deformities, calf tenderness, cyanosis or clubbing  SKIN: warm and dry without lesions    NEURO:  alert, approp, no deficits     I personally reviewed images and agree with radiology impression as follows:  CXR:  08/30/15 Hypoinspiratory exam with low lung volumes. No evidence of acute cardiopulmonary abnormality.  Labs ordered/ reviewed:  Lab Results  Component Value Date   TSH 1.70 09/08/2015     Lab Results  Component Value Date   PROBNP 17.0 09/08/2015     Lab Results  Component Value Date   DDIMER 0.41 09/08/2015           Assessment:

## 2015-09-08 NOTE — Patient Instructions (Addendum)
Taper off your prednisone  Prilosec 20 mg take x 2 x 30-60 min before first meal of the day   At bedtime take pepcid 20 mg at bedtime with phenergan with Codeine at bedtime   GERD (REFLUX)  is an extremely common cause of respiratory symptoms just like yours , many times with no obvious heartburn at all.    It can be treated with medication, but also with lifestyle changes including elevation of the head of your bed (ideally with 6 inch  bed blocks),  Smoking cessation, avoidance of late meals, excessive alcohol, and avoid fatty foods, chocolate, peppermint, colas, red wine, and acidic juices such as orange juice.  NO MINT OR MENTHOL PRODUCTS SO NO COUGH DROPS  USE SUGARLESS CANDY INSTEAD (Jolley ranchers or Stover's or Life Savers) or even ice chips will also do - the key is to swallow to prevent all throat clearing. NO OIL BASED VITAMINS - use powdered substitutes.   Please remember to go to the lab and x-ray department downstairs for your tests - we will call you with the results when they are available.  Stay on dulera and singulair for now   Only use your albuterol (proair)as a rescue medication to be used if you can't catch your breath by resting or doing a relaxed purse lip breathing pattern.  - The less you use it, the better it will work when you need it. - Ok to use up to 2 puffs  every 4 hours if you must but call for immediate appointment if use goes up over your usual need - Don't leave home without it !!  (think of it like the spare tire for your car)   Only use the nebulizer if you try the proair fist and it doesn't work  Please schedule a follow up office visit in 2 weeks, sooner if needed with all meds in hand

## 2015-09-09 NOTE — Progress Notes (Signed)
Quick Note:  lmtcb ______ 

## 2015-09-09 NOTE — Progress Notes (Signed)
Quick Note:  LMTCB ______ 

## 2015-09-11 ENCOUNTER — Encounter: Payer: Self-pay | Admitting: Internal Medicine

## 2015-09-11 NOTE — Assessment & Plan Note (Addendum)
Spirometry 09/08/2015 wnl including fef 25-75 - 09/08/2015   Walked RA x one lap @ 185 stopped due to  Sob, no desat   She may indeed have asthma but Symptoms are markedly disproportionate to objective findings and not clear this is a lung problem but pt does appear to have difficult airway management issues. DDX of  difficult airways management all start with A and  include Adherence, Ace Inhibitors, Acid Reflux, Active Sinus Disease, Alpha 1 Antitripsin deficiency, Anxiety masquerading as Airways dz,  ABPA,  allergy(esp in young), Aspiration (esp in elderly), Adverse effects of meds,  Active smokers, A bunch of PE's (a small clot burden can't cause this syndrome unless there is already severe underlying pulm or vascular dz with poor reserve) plus two Bs  = Bronchiectasis and Beta blocker use..and one C= CHF   Adherence is always the initial "prime suspect" and is a multilayered concern that requires a "trust but verify" approach in every patient - starting with knowing how to use medications, especially inhalers, correctly, keeping up with refills and understanding the fundamental difference between maintenance and prns vs those medications only taken for a very short course and then stopped and not refilled.  -The proper method of use, as well as anticipated side effects, of a metered-dose inhaler are discussed and demonstrated to the patient. Improved effectiveness after extensive coaching during this visit to a level of approximately  75% so continue dulera 200 bid   ? Acid (or non-acid) GERD > always difficult to exclude as up to 75% of pts in some series report no assoc GI/ Heartburn symptoms> rec max (24h)  acid suppression and diet restrictions/ reviewed and instructions given in writing.   ? Anxiety > usually dx of exclusion but I think higher here  ? Allergy > continue to taper off pred / continue singulair   ? A bunch of pe's > D dimer nl - while  A nl valute  may miss small peripheral pe, the  clot burden with sob is moderately high and the d dimer has a very high neg pred value in this setting    I had an extended discussion with the patient reviewing all relevant studies completed to date and  lasting 15 to 20 minutes of a 25 minute visit    Each maintenance medication was reviewed in detail including most importantly the difference between maintenance and prns and under what circumstances the prns are to be triggered using an action plan format that is not reflected in the computer generated alphabetically organized AVS.    Please see instructions for details which were reviewed in writing and the patient given a copy highlighting the part that I personally wrote and discussed at today's ov.

## 2015-09-11 NOTE — Assessment & Plan Note (Signed)
Body mass index is 34.45    Lab Results  Component Value Date   TSH 1.70 09/08/2015     Contributing to gerd tendency/ doe/reviewed need  achieve and maintain neg calorie balance > defer f/u primary care including intermittently monitoring thyroid status

## 2015-09-12 NOTE — Progress Notes (Signed)
Quick Note:  LMTCB and home, called work number and was advised she does not work there ______

## 2015-09-12 NOTE — Progress Notes (Signed)
Quick Note:  LMTCB and home, called work number and was advised she does not work there ______ 

## 2015-09-14 ENCOUNTER — Ambulatory Visit (INDEPENDENT_AMBULATORY_CARE_PROVIDER_SITE_OTHER): Payer: Managed Care, Other (non HMO) | Admitting: Neurology

## 2015-09-14 ENCOUNTER — Ambulatory Visit (INDEPENDENT_AMBULATORY_CARE_PROVIDER_SITE_OTHER): Payer: Self-pay | Admitting: Neurology

## 2015-09-14 DIAGNOSIS — G43709 Chronic migraine without aura, not intractable, without status migrainosus: Secondary | ICD-10-CM

## 2015-09-14 DIAGNOSIS — R202 Paresthesia of skin: Secondary | ICD-10-CM

## 2015-09-14 DIAGNOSIS — Z0289 Encounter for other administrative examinations: Secondary | ICD-10-CM

## 2015-09-14 NOTE — Procedures (Signed)
   NCS (NERVE CONDUCTION STUDY) WITH EMG (ELECTROMYOGRAPHY) REPORT   STUDY DATE: September 14 2015 PATIENT NAME: Melissa [name] DOB: [birth year] MRN: [id number]    TECHNOLOGIST: Gearldine Shown ELECTROMYOGRAPHER: Levert Feinstein M.D.  CLINICAL INFORMATION: 29 years old right-handed female presenting with intermittent left hand paresthesia  FINDINGS: NERVE CONDUCTION STUDY: Bilateral ulnar, median sensory and motor responses were normal  NEEDLE ELECTROMYOGRAPHY: Selective needle examination was performed at left upper extremity muscles, left cervical paraspinal muscles.  Needle examination of left abductor pollicis brevis, pronator teres, flexor carpi ulnaris, extensor digital communis, biceps, triceps was normal  There was no spontaneous activity at left cervical paraspinal muscles, left C5-6 and 7.  IMPRESSION:   This is a normal study. There is no electrodiagnostic evidence of left upper extremity neuropathy or left cervical radiculopathy.   INTERPRETING PHYSICIAN:   Levert Feinstein M.D. Ph.D. [facility] [address], [address] [place], [place] [address] [phone]

## 2015-09-14 NOTE — Progress Notes (Signed)
Electrodiagnostic study today is normal, there is no evidence of left upper extremity neuropathy,or left cervical radiculopathy.

## 2015-09-20 ENCOUNTER — Inpatient Hospital Stay: Payer: Managed Care, Other (non HMO) | Admitting: Adult Health

## 2015-09-22 ENCOUNTER — Ambulatory Visit (INDEPENDENT_AMBULATORY_CARE_PROVIDER_SITE_OTHER)
Admission: RE | Admit: 2015-09-22 | Discharge: 2015-09-22 | Disposition: A | Discharge status: Home / Self Care | Payer: Managed Care, Other (non HMO) | Source: Ambulatory Visit | Attending: Internal Medicine | Admitting: Internal Medicine

## 2015-09-22 ENCOUNTER — Encounter: Payer: Self-pay | Admitting: Internal Medicine

## 2015-09-22 ENCOUNTER — Ambulatory Visit (INDEPENDENT_AMBULATORY_CARE_PROVIDER_SITE_OTHER): Payer: Managed Care, Other (non HMO) | Admitting: Internal Medicine

## 2015-09-22 VITALS — BP 122/80 | HR 100 | Ht 65.0 in | Wt 207.0 lb

## 2015-09-22 DIAGNOSIS — R0600 Dyspnea, unspecified: Secondary | ICD-10-CM | POA: Diagnosis not present

## 2015-09-22 DIAGNOSIS — J4530 Mild persistent asthma, uncomplicated: Secondary | ICD-10-CM | POA: Diagnosis not present

## 2015-09-22 MED ORDER — FAMOTIDINE 20 MG PO TABS: ORAL_TABLET | ORAL | Status: DC

## 2015-09-22 NOTE — Assessment & Plan Note (Signed)
Spirometry 09/08/2015 wnl including fef 25- - 09/22/2015  extensive coaching HFA effectiveness =    90% from a baseline of < 50%   DDX of  difficult airways management all start with A and  include Adherence, Ace Inhibitors, Acid Reflux, Active Sinus Disease, Alpha 1 Antitripsin deficiency, Anxiety masquerading as Airways dz,  ABPA,  allergy(esp in young), Aspiration (esp in elderly), Adverse effects of meds,  Active smokers, A bunch of PE's (a small clot burden can't cause this syndrome unless there is already severe underlying pulm or vascular dz with poor reserve) plus two Bs  = Bronchiectasis and Beta blocker use..and one C= CHF   Adherence is always the initial "prime suspect" and is a multilayered concern that requires a "trust but verify" approach in every patient - starting with knowing how to use medications, especially inhalers, correctly, keeping up with refills and understanding the fundamental difference between maintenance and prns vs those medications only taken for a very short course and then stopped and not refilled.  - The proper method of use, as well as anticipated side effects, of a metered-dose inhaler are discussed and demonstrated to the patient. Improved effectiveness after extensive coaching during this visit to a level of approximately  90% so continue dulera 200 2bid  ? Acid (or non-acid) GERD > always difficult to exclude as up to 75% of pts in some series report no assoc GI/ Heartburn symptoms> rec continue max (24h)  acid suppression and diet restrictions/ reviewed     ? Anxiety related to obesity/ deconditioning > continue walkng    I had an extended discussion with the patient reviewing all relevant studies completed to date and  lasting 15 to 20 minutes of a 25 minute visit    Each maintenance medication was reviewed in detail including most importantly the difference between maintenance and prns and under what circumstances the prns are to be triggered using an action  plan format that is not reflected in the computer generated alphabetically organized AVS.    Please see instructions for details which were reviewed in writing and the patient given a copy highlighting the part that I personally wrote and discussed at today's ov.

## 2015-09-22 NOTE — Progress Notes (Signed)
Subjective:     Patient ID: Melissa [name], female   DOB: Dec 03, 1986,    MRN: [id number]  HPI  54 yobf never smoker asthma as child freq missed school but rare ER/admit followed by Dr Stefan Church with good control thru HS on advair/ singulair and only needed albuterol with sports then in [place] [employer] and started requiring nebulizer then came to [place] various jobs did fine / stopped advair and just rx singulair in fall and fine for several years s need for albuterol then admit p ran out of singulair and started albuterol again 2 weeks pta:  Admit date: 08/26/2015 Discharge date: 09/01/2015  Discharge Diagnoses:   Status asthmaticus  Allergic rhinitis  Severe persistent asthma Sickle cell trait  HTN   Detailed Hospital Course:  This is a pleasant 29 year old nonsmoking female with a history of asthma who came to the [facility] emergency department on 08/26/2015 complaining of 3 days of shortness of breath. She says that she had no sick contacts but developed the onset of shortness of breath, substernal chest pain, and a dry cough 3 days prior to admission. She had been using albuterol at home. She has never had a severe asthma exacerbation in the past. She denied fevers or chills. She had had a slight amount of leg swelling that had not changed recently. She came to [facility] in the morning and was noted to be profoundly short of breath with very little air movement. She was treated by EMS en route with Solu-Medrol, magnesium, and albuterol. In the [facility] emergency department she was given more Solu-Medrol and magnesium, as well as epinephrine. She was treated with BiPAP. By the time of her arrival her shortness of breath had significantly improved. She was admitted initially to the intensive care w/ working dx of status asthmaticus. Treatment included: NIPPV, systemic steroids and scheduled BDs. She continued to improve over the course of her stay.  On 8/28 BD treatment was stepped down ans steroid taper continued. Nasal hygiene regimen started. Felt ready for d/c as of 8/29 w/ following plan of care per active issues   Discharge Plan by active problems  Status asthmaticus  Plan:  prednisone slow taper over 2 weeks Dulera Albuterol-MDI-PRN F/u sept 20th  History of hypertension Plan:  Change antihypertensive to norvasc instead of labetolol     09/08/2015 1st [facility] office visit/ Wert   Chief Complaint  Patient presents with  . HFU    Pt states her breathing has not returned back to baseline. She is having occ wheezing and feels tightness and pressure in her chest.  She has minimal non prod cough.  She is using rescue inhaler 4-5 x per day and has used neb x 2 since hospital d/o.  now on norvasc instead of labetalol and  still tapering off prednisone. Ok at rest / doe x HT and hs and has to sit up all night smothering/ no cough rec Taper off your prednisone  Prilosec 20 mg take x 2 x 30-60 min before first meal of the day   At bedtime take pepcid 20 mg at bedtime with phenergan with Codeine at bedtime   GERD (REFLUX)  is an extremely common cause of respiratory symptoms just like yours , many times with no obvious heartburn at all.    It can be treated with medication, but also with lifestyle changes including elevation of the head of your bed (ideally with 6 inch  bed blocks),  Smoking  cessation, avoidance of late meals, excessive alcohol, and avoid fatty foods, chocolate, peppermint, colas, red wine, and acidic juices such as orange juice.  NO MINT OR MENTHOL PRODUCTS SO NO COUGH DROPS  USE SUGARLESS CANDY INSTEAD (Jolley ranchers or Stover's or Life Savers) or even ice chips will also do - the key is to swallow to prevent all throat clearing. NO OIL BASED VITAMINS - use powdered substitutes.   Please remember to go to the lab and x-ray department downstairs for your tests - we will call you with the results  when they are available.  Stay on dulera and singulair for now   Only use your albuterol (proair)as a rescue medication to be used if you can't catch your breath by resting or doing a relaxed purse lip breathing pattern.  - The less you use it, the better it will work when you need it. - Ok to use up to 2 puffs  every 4 hours if you must but call for immediate appointment if use goes up over your usual need - Don't leave home without it !!  (think of it like the spare tire for your car)   Only use the nebulizer if you try the proair fist and it doesn't work  Please schedule a follow up office visit in 2 weeks, sooner if needed with all meds in hand       09/22/2015 f/u ov/Wert re: chronic asthma/ no meds  Chief Complaint  Patient presents with  . Follow-up    Pt states that her breathing continues to improve, but not back to baseline. She still has some chest tightness and pressure but less since last visit. She is using albuterol inhaler 3-4 x per wk on average and has not needed neb.     Body mass index is 34.45 kg/(m^2).   Still having to use codeine at hs but no longer smothering/? Using pepcid at hs otc? As rec hfa better, still oc need for saba Able to walk x 15 m s stopping    No obvious day to day or daytime variability or assoc  cp or chest tightness, subjective wheeze or overt sinus or hb symptoms. No unusual exp hx or h/o childhood pna/ asthma or knowledge of premature birth.   Also denies any obvious fluctuation of symptoms with weather or environmental changes or other aggravating or alleviating factors except as outlined above   Current Medications, Allergies, Complete Past Medical History, Past Surgical History, Family History, and Social History were reviewed in [employer] record.  ROS  The following are not active complaints unless bolded sore throat, dysphagia, dental problems, itching, sneezing,  nasal congestion or excess/ purulent  secretions, ear ache,   fever, chills, sweats, unintended wt loss, classically pleuritic or exertional cp, hemoptysis,  orthopnea pnd or leg swelling, presyncope, palpitations, abdominal pain, anorexia, nausea, vomiting, diarrhea  or change in bowel or bladder habits, change in stools or urine, dysuria,hematuria,  rash, arthralgias, visual complaints, headache, numbness, weakness or ataxia or problems with walking or coordination,  change in mood/affect or memory.              Objective:   Physical Exam  amb bm nad  09/22/2015        207  Wt Readings from Last 3 Encounters:  09/08/15 207 lb (93.895 kg)  09/01/15 204 lb 9.4 oz (92.8 kg)  08/10/15 197 lb (89.359 kg)    Vital signs reviewed   HEENT: nl dentition, turbinates,  and orophanx. Nl external ear canals without cough reflex   NECK :  without JVD/Nodes/TM/ nl carotid upstrokes bilaterally   LUNGS: no acc muscle use, clear to A and P bilaterally without cough on insp or exp maneuvers   CV:  RRR  no s3 or murmur or increase in P2, no edema   ABD:  soft and nontender with nl excursion in the supine position. No bruits or organomegaly, bowel sounds nl  MS:  warm without deformities, calf tenderness, cyanosis or clubbing  SKIN: warm and dry without lesions    NEURO:  alert, approp, no deficits     I personally reviewed images and agree with radiology impression as follows:  CXR:  08/30/15 Hypoinspiratory exam with low lung volumes. No evidence of acute cardiopulmonary abnormality.              Assessment:

## 2015-09-22 NOTE — Patient Instructions (Signed)
Be sure you take pepcid 20 mg at bedtime  Work on inhaler technique:  relax and gently blow all the way out then take a nice smooth deep breath back in, triggering the inhaler at same time you start breathing in.  Hold for up to 5 seconds if you can. Blow out thru nose. Rinse and gargle with water when done      Please remember to go to the x-ray department downstairs for your tests - we will call you with the results when they are available.  See Tammy NP w/in 2 weeks (first avail thereafter)  with all your medications, even over the counter meds, separated in two separate bags, the ones you take no matter what vs the ones you stop once you feel better and take only as needed when you feel you need them.   Tammy  will generate for you a new user friendly medication calendar that will put [place] all on the same page re: your medication use.     Without this process, it simply isn't possible to assure that we are providing  your outpatient care  with  the attention to detail we feel you deserve.   If we cannot assure that you're getting that kind of care,  then we cannot manage your problem effectively from this clinic.  Once you have seen Tammy and we are sure that we're all on the same page with your medication use she will arrange follow up with me.

## 2015-09-22 NOTE — Assessment & Plan Note (Signed)
Suspect now this is all deconditioning, not asthma

## 2015-09-23 NOTE — Progress Notes (Signed)
Quick Note:  Spoke with pt and notified of results per Dr. Wert. Pt verbalized understanding and denied any questions.  ______ 

## 2015-10-06 ENCOUNTER — Encounter: Payer: Managed Care, Other (non HMO) | Admitting: Adult Health

## 2015-10-18 ENCOUNTER — Encounter: Payer: Managed Care, Other (non HMO) | Admitting: Adult Health

## 2016-01-30 IMAGING — CR DG CHEST 1V PORT
1 series · 1 of 1 positions shown · non-contrast
Comparison: Chest x-ray dated 08/26/2015.

CLINICAL DATA: Shortness of breath and dry cough since [REDACTED].
Asthma attack on [REDACTED].

EXAM:
PORTABLE CHEST - 1 VIEW

[AP]
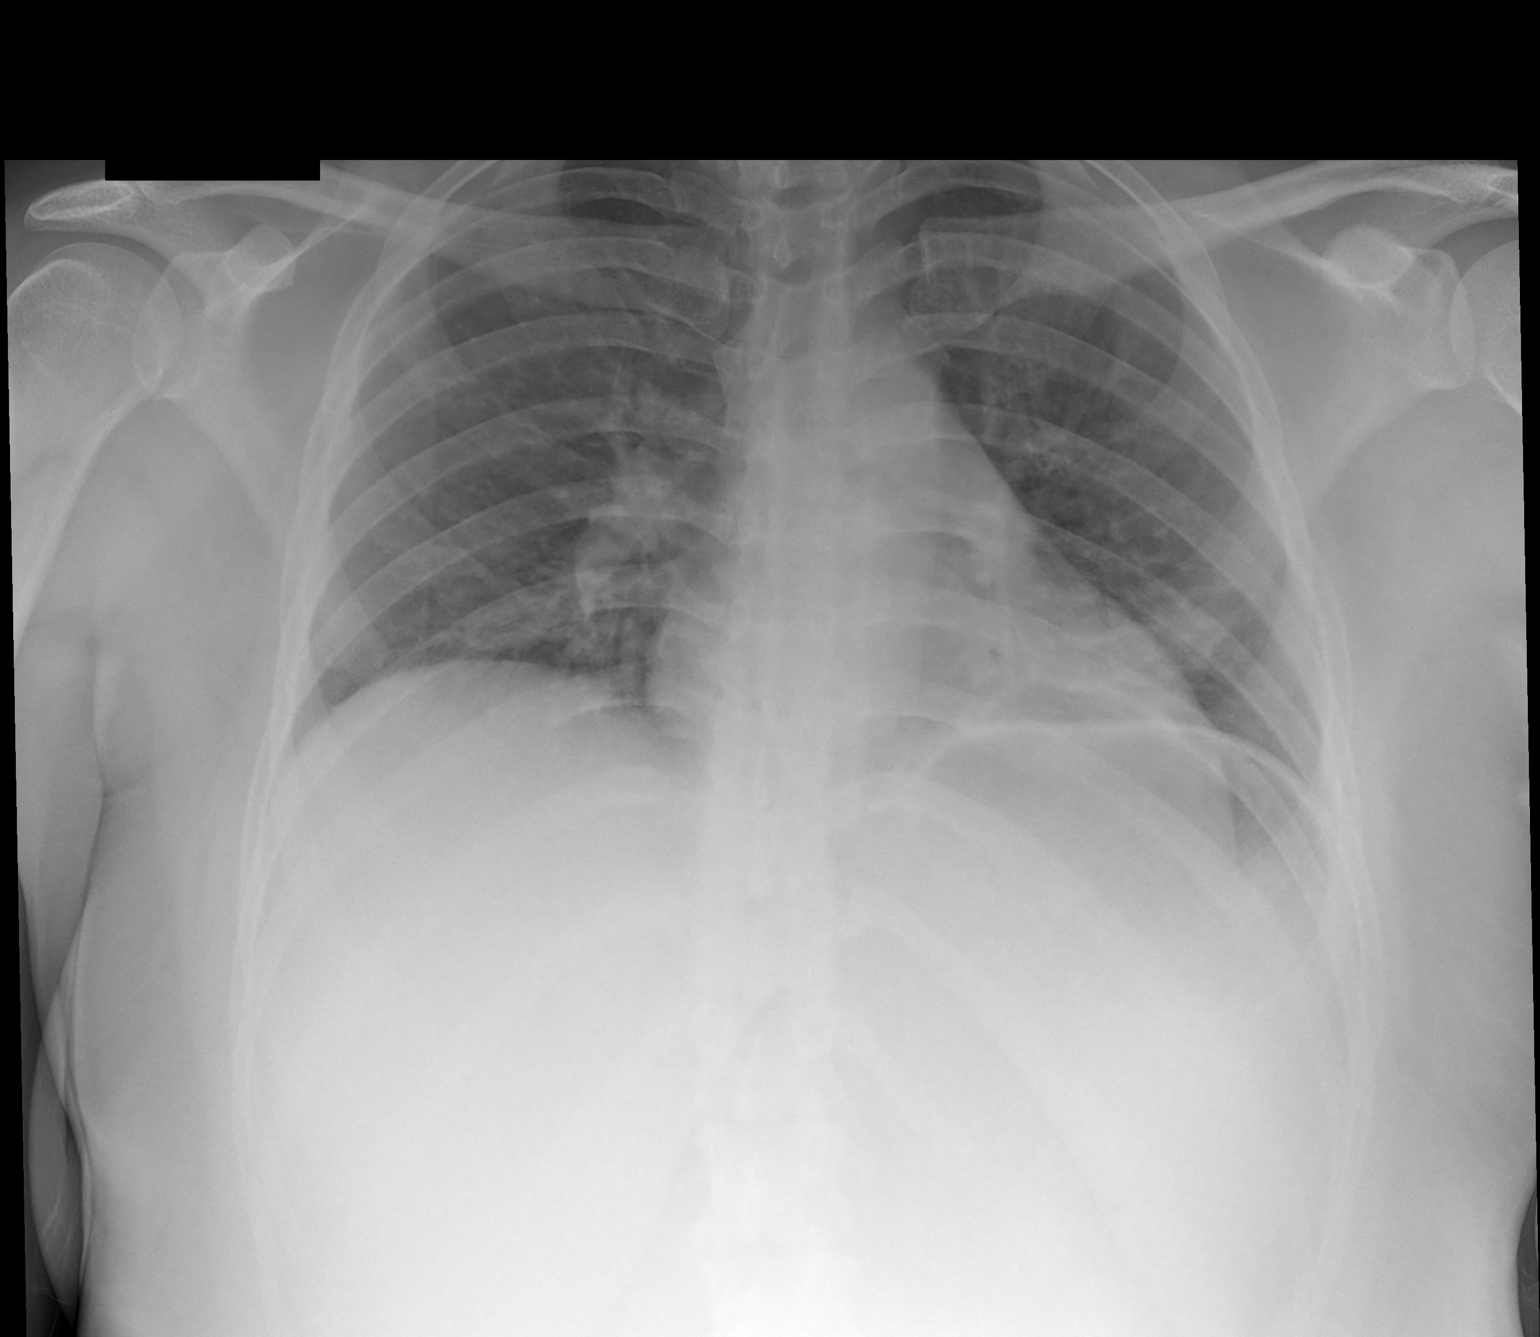

[1 of 1 positions shown; findings below may reference images not displayed]

FINDINGS: Study is hypoinspiratory with crowding of the perihilar and
bibasilar bronchovascular markings. Given the low lung volumes,
lungs appear clear. No confluent airspace opacity to suggest a
consolidating pneumonia. No pleural effusion. No pneumothorax.

Heart size is normal. Mediastinal contours are normal in
configuration. No osseous abnormality.
IMPRESSION: Hypoinspiratory exam with low lung volumes. No evidence of acute
cardiopulmonary abnormality.

## 2016-02-22 IMAGING — CR DG CHEST 2V
2 series · 2 of 2 positions shown · non-contrast
Comparison: Chest x-ray of 09/08/2015

CLINICAL DATA: Shortness of breath, recent exacerbation of asthma

EXAM:
CHEST  2 VIEW

[view not recorded (1 of 2)]
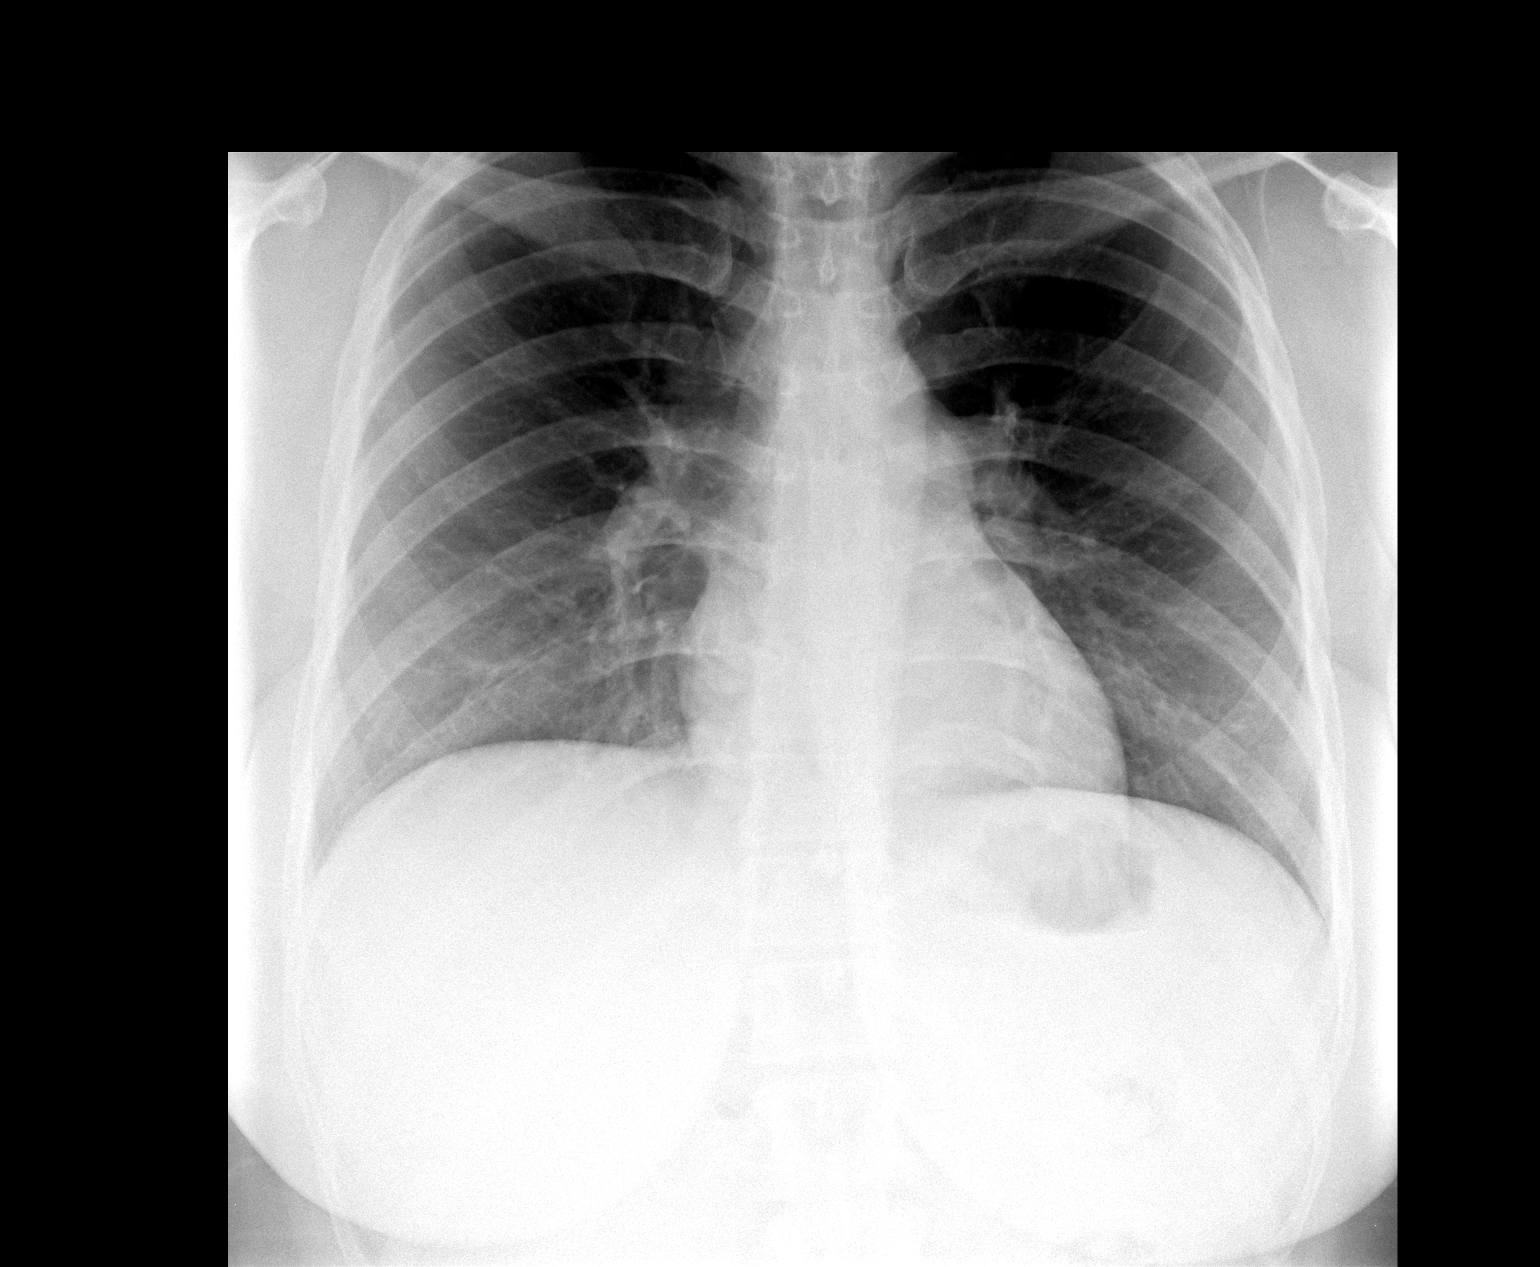

[view not recorded (2 of 2)]
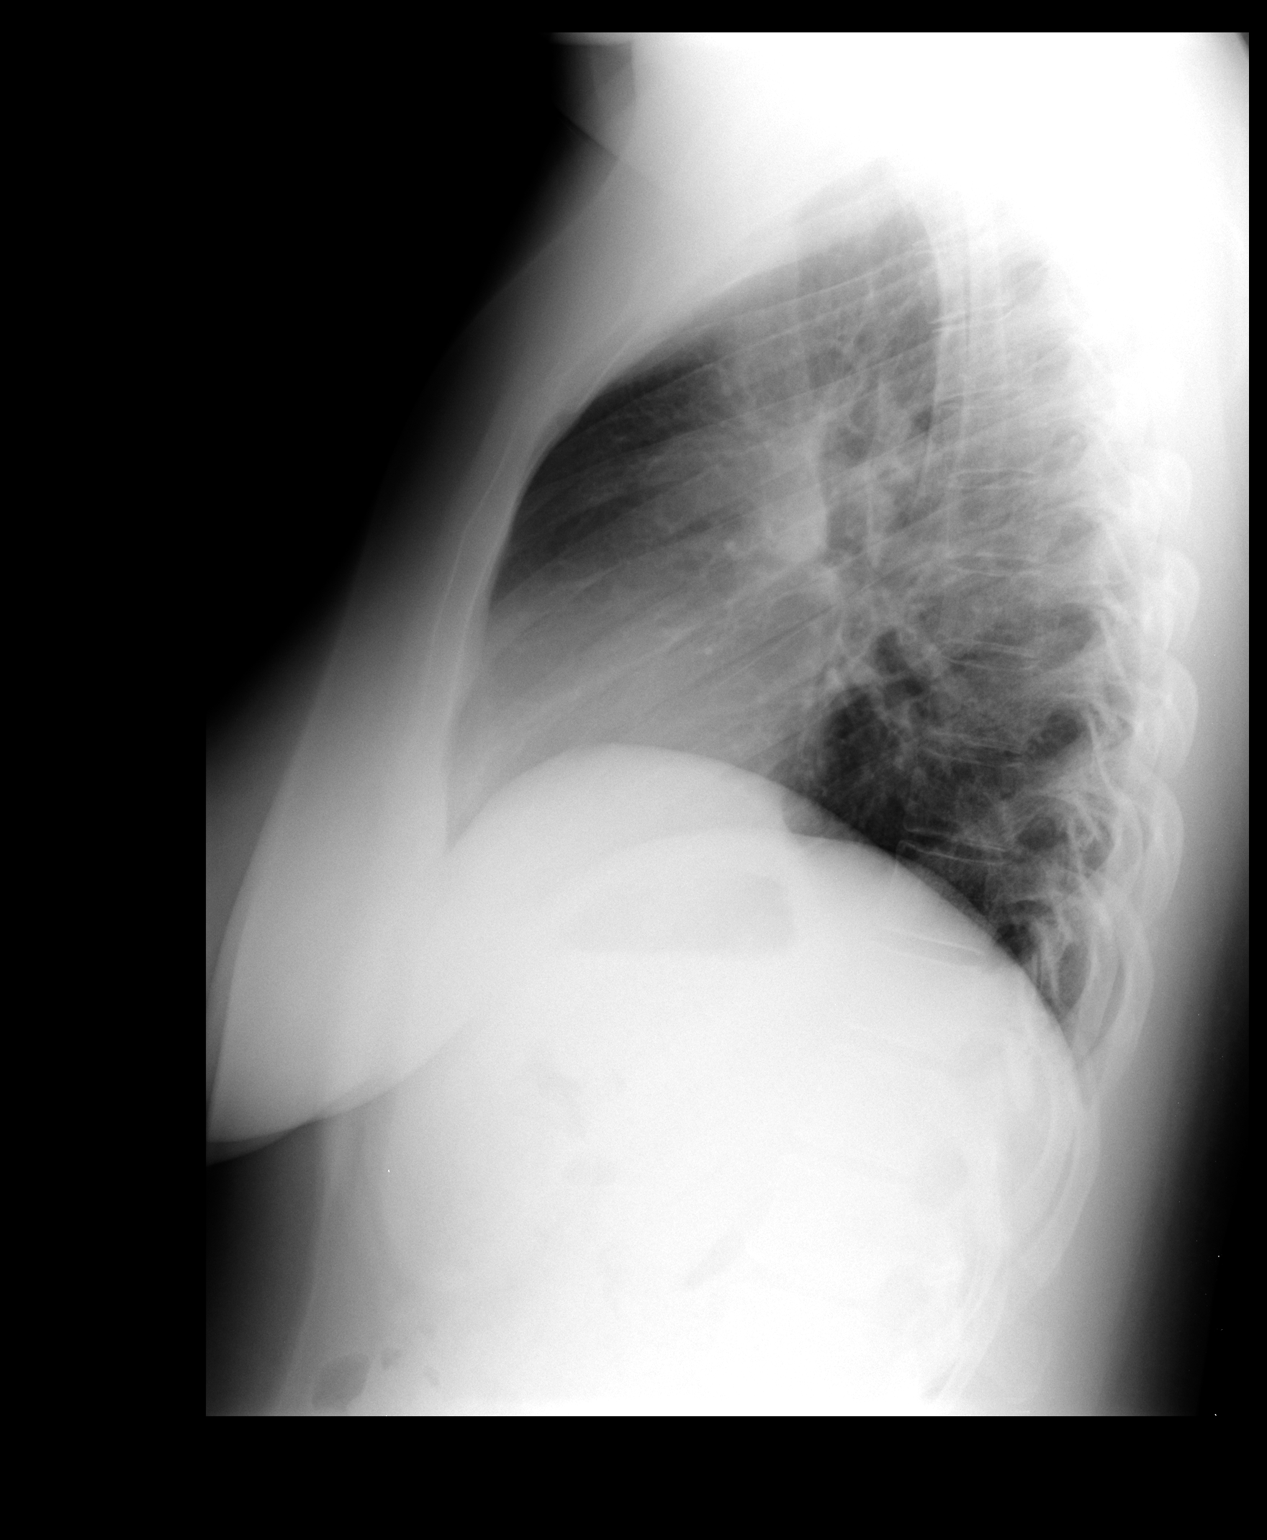

[2 of 2 positions shown; findings below may reference images not displayed]

FINDINGS: No active infiltrate or effusion is seen. Mediastinal and hilar
contours are unremarkable. The heart is within normal limits in
size. No bony abnormality is seen.
IMPRESSION: No active cardiopulmonary disease.

## 2016-05-21 ENCOUNTER — Inpatient Hospital Stay (HOSPITAL_COMMUNITY)
Admission: AD | Admit: 2016-05-21 | Discharge: 2016-05-22 | Disposition: A | Discharge status: Home / Self Care | Payer: BLUE CROSS/BLUE SHIELD | Source: Ambulatory Visit | Attending: Obstetrics and Gynecology | Admitting: Obstetrics and Gynecology

## 2016-05-21 DIAGNOSIS — Z3202 Encounter for pregnancy test, result negative: Secondary | ICD-10-CM | POA: Insufficient documentation

## 2016-05-21 DIAGNOSIS — J45909 Unspecified asthma, uncomplicated: Secondary | ICD-10-CM | POA: Insufficient documentation

## 2016-05-21 DIAGNOSIS — D649 Anemia, unspecified: Secondary | ICD-10-CM | POA: Insufficient documentation

## 2016-05-21 DIAGNOSIS — N939 Abnormal uterine and vaginal bleeding, unspecified: Secondary | ICD-10-CM

## 2016-05-21 DIAGNOSIS — N921 Excessive and frequent menstruation with irregular cycle: Secondary | ICD-10-CM

## 2016-05-21 DIAGNOSIS — Z86010 Personal history of colonic polyps: Secondary | ICD-10-CM | POA: Insufficient documentation

## 2016-05-21 DIAGNOSIS — Z8673 Personal history of transient ischemic attack (TIA), and cerebral infarction without residual deficits: Secondary | ICD-10-CM | POA: Insufficient documentation

## 2016-05-22 ENCOUNTER — Encounter (HOSPITAL_COMMUNITY): Payer: Self-pay | Admitting: *Deleted

## 2016-05-22 DIAGNOSIS — N939 Abnormal uterine and vaginal bleeding, unspecified: Secondary | ICD-10-CM

## 2016-05-22 DIAGNOSIS — Z8673 Personal history of transient ischemic attack (TIA), and cerebral infarction without residual deficits: Secondary | ICD-10-CM | POA: Diagnosis not present

## 2016-05-22 DIAGNOSIS — Z86010 Personal history of colonic polyps: Secondary | ICD-10-CM | POA: Diagnosis not present

## 2016-05-22 DIAGNOSIS — J45909 Unspecified asthma, uncomplicated: Secondary | ICD-10-CM | POA: Diagnosis not present

## 2016-05-22 DIAGNOSIS — Z3202 Encounter for pregnancy test, result negative: Secondary | ICD-10-CM | POA: Diagnosis not present

## 2016-05-22 DIAGNOSIS — D649 Anemia, unspecified: Secondary | ICD-10-CM | POA: Diagnosis not present

## 2016-05-22 LAB — CBC WITH DIFFERENTIAL/PLATELET
Basophils Absolute: 0 10*3/uL (ref 0.0–0.1)
Basophils Relative: 0 %
EOS ABS: 0.2 10*3/uL (ref 0.0–0.7)
EOS PCT: 4 %
HCT: 29.3 % — ABNORMAL LOW (ref 36.0–46.0)
HEMOGLOBIN: 9.7 g/dL — AB (ref 12.0–15.0)
LYMPHS ABS: 2.3 10*3/uL (ref 0.7–4.0)
Lymphocytes Relative: 34 %
MCH: 28.7 pg (ref 26.0–34.0)
MCHC: 33.1 g/dL (ref 30.0–36.0)
MCV: 86.7 fL (ref 78.0–100.0)
MONO ABS: 0.4 10*3/uL (ref 0.1–1.0)
MONOS PCT: 6 %
NEUTROS PCT: 56 %
Neutro Abs: 3.8 10*3/uL (ref 1.7–7.7)
Platelets: 217 10*3/uL (ref 150–400)
RBC: 3.38 MIL/uL — ABNORMAL LOW (ref 3.87–5.11)
RDW: 14.3 % (ref 11.5–15.5)
WBC: 6.7 10*3/uL (ref 4.0–10.5)

## 2016-05-22 LAB — GC/CHLAMYDIA PROBE AMP (~~LOC~~) NOT AT ARMC
CHLAMYDIA, DNA PROBE: NEGATIVE
Neisseria Gonorrhea: NEGATIVE

## 2016-05-22 LAB — WET PREP, GENITAL
CLUE CELLS WET PREP: NONE SEEN
SPERM: NONE SEEN
TRICH WET PREP: NONE SEEN
Yeast Wet Prep HPF POC: NONE SEEN

## 2016-05-22 LAB — POCT PREGNANCY, URINE: PREG TEST UR: NEGATIVE

## 2016-05-22 MED ORDER — VITAMIN C 500 MG PO TABS: 500.0000 mg | ORAL_TABLET | Freq: Every day | ORAL | Status: DC

## 2016-05-22 MED ORDER — FERROUS SULFATE 325 (65 FE) MG PO TABS
325.0000 mg | ORAL_TABLET | Freq: Once | ORAL | Status: AC
  Administered 2016-05-22: 325 mg via ORAL
  Filled 2016-05-22: qty 1

## 2016-05-22 MED ORDER — MEDROXYPROGESTERONE ACETATE 10 MG PO TABS
10.0000 mg | ORAL_TABLET | Freq: Once | ORAL | Status: AC
  Administered 2016-05-22: 10 mg via ORAL
  Filled 2016-05-22: qty 1

## 2016-05-22 MED ORDER — IBUPROFEN 800 MG PO TABS
800.0000 mg | ORAL_TABLET | Freq: Once | ORAL | Status: AC
  Administered 2016-05-22: 800 mg via ORAL
  Filled 2016-05-22: qty 1

## 2016-05-22 MED ORDER — MEDROXYPROGESTERONE ACETATE 10 MG PO TABS: 10.0000 mg | ORAL_TABLET | Freq: Every day | ORAL | Status: DC

## 2016-05-22 NOTE — MAU Note (Signed)
Pt has had nexplanon in place for one year, for the past month she complains of heavy vag bleeding and low abd cramping . Was seen 16 May 2016 in office and given norethindrone 5mg  to take po for the bleeding.  Pt reports bleeding increased.

## 2016-05-22 NOTE — MAU Provider Note (Signed)
History  Melissa [name] is a 30 yo female who presents unannounced to MAU w/ c/o heavy bleeding/passage of clots and cramping x1 week.  Nexplanon since Feb 2016. Ongoing issue. Treatments include NSAIDs, Provera and now Norethindrone. Notes that bleeding has increased abundantly within the past week, to the point of doubling up on both super tampons/maxi pads, and regularly changing clothes. Changing pad/tampon every 2-4 hours. Had brief relief w/ Provera. Ultrasound normal on March 02, 2016. Prior to Nexplanon had 7 day flow, pad change twice a day with minimal cramping. Last took Ibuprofen 05/18/16.  Patient Active Problem List   Diagnosis Date Noted  . Abnormal vaginal bleeding 05/22/2016  . Mild persistent chronic asthma without complication 09/22/2015  . Laryngopharyngeal reflux (LPR)   . Dyspnea   . Physical deconditioning   . Chronic hypertension complicating or reason for care during pregnancy 11/26/2014  . Positive GBS test 11/26/2014  . Chronic pain syndrome 11/26/2014  . Obesity (BMI 30-39.9) 11/26/2014  . History of CVA (cerebrovascular accident) 04/09/2013  . Hx Endometrial polyp 02/16/2013  . Sickle cell trait (HCC) 02/16/2013  . Severe persistent asthma with allergic rhinitis with status asthmaticus 02/16/2013  . Migraines 02/16/2013    No chief complaint on file.  HPI  As above  OB History    Gravida Para Term Preterm AB TAB SAB Ectopic Multiple Living   0 3      Past Medical History  Diagnosis Date  . Placenta previa antepartum 2010    Placenta moved 3 days before baby was born  . Abnormal Pap smear 2010    Repeat pap done;Hysteroscopy;Last pap 2012;was normal  . Infection     BV;may get frequently  . Anemia 2010    Iron supplements PP  . Asthma     Has a nebulizer;triggered by stress  . Stroke [facility]) 2013    MD thought may have had a stroke and heart attack;has neurologist @ [facility]  . Heart attack [facility]     MD thinks may have  had a heart attack;Cardiologist @ [facility]  . Nerve pain     Chronic;d/t possible stroke and MI  . Sickle cell trait (HCC)   . Headache(784.0)   . History of CVA (cerebrovascular accident) 04/09/2013    Pt reports presumptive "stroke" when she was 30y.o.; followed by [facility] neurology for "chronic nerve pain" and has been Rx'd Hydrocodone and Mobic  . Colon polyps     Past Surgical History  Procedure Laterality Date  . Hysteroscopy  2010    Family History  Problem Relation Age of Onset  . Sickle cell trait Father   . Hypertension Mother   . Anemia Mother   . Asthma Mother   . Cervical cancer Paternal Aunt   . Cervical cancer Paternal Grandmother   . Diabetes Paternal Grandmother   . Diabetes Paternal Grandfather   . Diabetes Paternal Aunt   . Asthma Brother   . Asthma Daughter   . Asthma Son     Social History  Substance Use Topics  . Smoking status: Never Smoker   . Smokeless tobacco: Never Used  . Alcohol Use: 0.0 oz/week    0 Standard drinks or equivalent per week     Comment: socially    Allergies:  Allergies  Allergen Reactions  . Ginger Anaphylaxis    No prescriptions prior to admission    ROS  As per HPI Physical Exam   Blood  pressure 120/70, pulse 85, temperature 98.6 F (37 C), temperature source Oral, resp. rate 16, last menstrual period 04/28/2016, unknown if currently breastfeeding.  Results for orders placed or performed during the hospital encounter of 05/21/16 (from the past 24 hour(s))  Wet prep, genital     Status: Abnormal   Collection Time: 05/22/16 12:35 AM  Result Value Ref Range   Yeast Wet Prep HPF POC NONE SEEN NONE SEEN   Trich, Wet Prep NONE SEEN NONE SEEN   Clue Cells Wet Prep HPF POC NONE SEEN NONE SEEN   WBC, Wet Prep HPF POC FEW (A) NONE SEEN   Sperm NONE SEEN   Pregnancy, urine POC     Status: None   Collection Time: 05/22/16 12:56 AM  Result Value Ref Range   Preg Test, Ur NEGATIVE NEGATIVE  CBC with  Differential/Platelet     Status: Abnormal   Collection Time: 05/22/16  1:14 AM  Result Value Ref Range   WBC 6.7 4.0 - 10.5 K/uL   RBC 3.38 (L) 3.87 - 5.11 MIL/uL   Hemoglobin 9.7 (L) 12.0 - 15.0 g/dL   HCT 63.1 (L) 49.7 - 02.6 %   MCV 86.7 78.0 - 100.0 fL   MCH 28.7 26.0 - 34.0 pg   MCHC 33.1 30.0 - 36.0 g/dL   RDW 37.8 58.8 - 50.2 %   Platelets 217 150 - 400 K/uL   Neutrophils Relative % 56 %   Neutro Abs 3.8 1.7 - 7.7 K/uL   Lymphocytes Relative 34 %   Lymphs Abs 2.3 0.7 - 4.0 K/uL   Monocytes Relative 6 %   Monocytes Absolute 0.4 0.1 - 1.0 K/uL   Eosinophils Relative 4 %   Eosinophils Absolute 0.2 0.0 - 0.7 K/uL   Basophils Relative 0 %   Basophils Absolute 0.0 0.0 - 0.1 K/uL    Physical Exam  Gen: NAD. Lungs: CTAB. CV: RRR w/o M/R/G. Abdomen: soft, NT, no rebound or guarding. Speculum: Dark red blood in vault, no active bleeding from cervix. No odor. Pelvic: Uterus non-tender, no CMT or adnexal tenderness/mass.    Hbg 12.7 in office on 05/16/16, 9.7 at present   ED Course  Assessment: Abnormal bleeding Anemia  Plan: Consulted Dr. Dion Body - D/C Norethindrone and restart Provera 10 mg x 10 days. Will give first dose now. Ferrous Sulfate po bid x 30 days & Vitamin C 500 mg po daily x 30 d. Will give Iron tab now. Note to office to contact pt for appointment ASAP w/ MD for further management.   Sherre Scarlet CNM, MS 05/22/16, 3:30 AM

## 2018-01-03 ENCOUNTER — Encounter (HOSPITAL_COMMUNITY): Payer: Self-pay | Admitting: Nurse Practitioner

## 2018-01-03 ENCOUNTER — Emergency Department (HOSPITAL_COMMUNITY): Payer: BLUE CROSS/BLUE SHIELD

## 2018-01-03 DIAGNOSIS — J45909 Unspecified asthma, uncomplicated: Secondary | ICD-10-CM | POA: Insufficient documentation

## 2018-01-03 DIAGNOSIS — I10 Essential (primary) hypertension: Secondary | ICD-10-CM | POA: Insufficient documentation

## 2018-01-03 DIAGNOSIS — Y33XXXA Other specified events, undetermined intent, initial encounter: Secondary | ICD-10-CM | POA: Insufficient documentation

## 2018-01-03 DIAGNOSIS — Z79899 Other long term (current) drug therapy: Secondary | ICD-10-CM | POA: Insufficient documentation

## 2018-01-03 DIAGNOSIS — Y929 Unspecified place or not applicable: Secondary | ICD-10-CM | POA: Insufficient documentation

## 2018-01-03 DIAGNOSIS — Z8673 Personal history of transient ischemic attack (TIA), and cerebral infarction without residual deficits: Secondary | ICD-10-CM | POA: Insufficient documentation

## 2018-01-03 DIAGNOSIS — Y939 Activity, unspecified: Secondary | ICD-10-CM | POA: Insufficient documentation

## 2018-01-03 DIAGNOSIS — D573 Sickle-cell trait: Secondary | ICD-10-CM | POA: Insufficient documentation

## 2018-01-03 DIAGNOSIS — Y998 Other external cause status: Secondary | ICD-10-CM | POA: Insufficient documentation

## 2018-01-03 DIAGNOSIS — S63652A Sprain of metacarpophalangeal joint of right middle finger, initial encounter: Secondary | ICD-10-CM | POA: Insufficient documentation

## 2018-01-03 DIAGNOSIS — I252 Old myocardial infarction: Secondary | ICD-10-CM | POA: Insufficient documentation

## 2018-01-03 NOTE — ED Triage Notes (Signed)
Pt is c/o left finger pain, left middle and ring finger that she reports initially a month ago, and last week again. C/o 9/10 pain

## 2018-01-04 ENCOUNTER — Emergency Department (HOSPITAL_COMMUNITY)
Admission: EM | Admit: 2018-01-04 | Discharge: 2018-01-04 | Disposition: A | Discharge status: Home / Self Care | Payer: BLUE CROSS/BLUE SHIELD | Attending: Emergency Medicine | Admitting: Emergency Medicine

## 2018-01-04 DIAGNOSIS — S63652A Sprain of metacarpophalangeal joint of right middle finger, initial encounter: Secondary | ICD-10-CM

## 2018-01-04 MED ORDER — HYDROCODONE-ACETAMINOPHEN 5-325 MG PO TABS: 1.0000 | ORAL_TABLET | ORAL | 0 refills | Status: DC | PRN

## 2018-01-04 MED ORDER — IBUPROFEN 200 MG PO TABS
600.0000 mg | ORAL_TABLET | Freq: Once | ORAL | Status: AC
  Administered 2018-01-04: 600 mg via ORAL
  Filled 2018-01-04: qty 3

## 2018-01-04 NOTE — ED Provider Notes (Signed)
[facility]-EMERGENCY DEPT Provider Note   CSN: [id number] Arrival date & time: 01/03/18  1938     History   Chief Complaint Chief Complaint  Patient presents with  . Hand Pain    HPI Melissa [name] is a 32 y.o. female who presents with left middle finger pain. She reports originally injuring it a month ago after hitting it against a wall and hyperflexing her fingers. She then hurt it again a week ago. She had brusing and swelling to the area. The pain is worse with movement of the fingers and palpation. She has been taking Ibuprofen 800mg  q6 hours without significant relief. No numbness/tingling, weakness.   HPI  Past Medical History:  Diagnosis Date  . Abnormal Pap smear 2010   Repeat pap done;Hysteroscopy;Last pap 2012;was normal  . Anemia 2010   Iron supplements PP  . Asthma    Has a nebulizer;triggered by stress  . Colon polyps   . Headache(784.0)   . Heart attack [facility])    MD thinks may have had a heart attack;Cardiologist @ [facility]  . History of CVA (cerebrovascular accident) 04/09/2013   Pt reports presumptive "stroke" when she was 32y.o.; followed by [facility] neurology for "chronic nerve pain" and has been Rx'd Hydrocodone and Mobic  . Infection    BV;may get frequently  . Nerve pain    Chronic;d/t possible stroke and MI  . Placenta previa antepartum 2010   Placenta moved 3 days before baby was born  . Sickle cell trait (HCC)   . Stroke [facility]) 2013   MD thought may have had a stroke and heart attack;has neurologist @ [facility]    Patient Active Problem List   Diagnosis Date Noted  . Abnormal vaginal bleeding 05/22/2016  . Mild persistent chronic asthma without complication 09/22/2015  . Laryngopharyngeal reflux (LPR)   . Dyspnea   . Physical deconditioning   . Chronic hypertension complicating or reason for care during pregnancy 11/26/2014  . Positive GBS test 11/26/2014  . Chronic pain syndrome 11/26/2014  . Obesity (BMI  30-39.9) 11/26/2014  . History of CVA (cerebrovascular accident) 04/09/2013  . Hx Endometrial polyp 02/16/2013  . Sickle cell trait (HCC) 02/16/2013  . Severe persistent asthma with allergic rhinitis with status asthmaticus 02/16/2013  . Migraines 02/16/2013    Past Surgical History:  Procedure Laterality Date  . HYSTEROSCOPY  2010    OB History    Gravida Para Term Preterm AB Living   3 3 3     3    SAB TAB Ectopic Multiple Live Births         0 3       Home Medications    Prior to Admission medications   Medication Sig Start Date End Date Taking? Authorizing Provider  albuterol (PROVENTIL HFA;VENTOLIN HFA) 108 (90 BASE) MCG/ACT inhaler Inhale 2 puffs into the lungs every 4 (four) hours as needed for wheezing or shortness of breath. 08/29/15   Simonne Martinet, NP  albuterol (PROVENTIL) (2.5 MG/3ML) 0.083% nebulizer solution Take 2.5 mg by nebulization every 4 (four) hours as needed for wheezing or shortness of breath.    [provider]  amLODipine (NORVASC) 5 MG tablet Take 1 tablet (5 mg total) by mouth daily. 08/29/15   Simonne Martinet, NP  etonogestrel (NEXPLANON) 68 MG IMPL implant 1 each by Subdermal route once.    [provider]  famotidine (PEPCID) 20 MG tablet One at bedtime 09/22/15   Nyoka Cowden, MD  ferrous sulfate 325 (65 FE) MG tablet Take 1 tablet (325 mg total) by mouth 2 (two) times daily with a meal. 12/01/14   Standard, Venus, CNM  fexofenadine (ALLEGRA) 180 MG tablet Take 180 mg by mouth daily as needed for allergies or rhinitis.    [provider]  fluticasone (FLONASE) 50 MCG/ACT nasal spray Place 2 sprays into both nostrils daily. 08/29/15   Simonne Martinet, NP  HYDROcodone-acetaminophen (NORCO/VICODIN) 5-325 MG tablet Take 1 tablet by mouth every 4 (four) hours as needed. 01/04/18   Bethel Born, PA-C  ibuprofen (ADVIL,MOTRIN) 600 MG tablet Take 600 mg by mouth every 6 (six) hours as needed.    [provider]    medroxyPROGESTERone (PROVERA) 10 MG tablet Take 1 tablet (10 mg total) by mouth daily. 05/23/16   Sherre Scarlet, CNM  mometasone-formoterol (DULERA) 200-5 MCG/ACT AERO Inhale 2 puffs into the lungs 2 (two) times daily. 08/29/15   Simonne Martinet, NP  montelukast (SINGULAIR) 10 MG tablet Take 1 tablet (10 mg total) by mouth at bedtime. 09/08/15   Nyoka Cowden, MD  omeprazole (PRILOSEC OTC) 20 MG tablet Take 1 tablet (20 mg total) by mouth daily. 09/01/15   Simonne Martinet, NP  promethazine-codeine (PHENERGAN WITH CODEINE) 6.25-10 MG/5ML syrup Take 5 mLs by mouth every 4 (four) hours as needed for cough. 09/01/15   Simonne Martinet, NP  rizatriptan (MAXALT-MLT) 5 MG disintegrating tablet Take 1 tablet (5 mg total) by mouth as needed. May repeat in 2 hours if needed 08/10/15   Levert Feinstein, MD  vitamin C (ASCORBIC ACID) 500 MG tablet Take 1 tablet (500 mg total) by mouth daily. 05/22/16   Sherre Scarlet, CNM    Family History Family History  Problem Relation Age of Onset  . Sickle cell trait Father   . Hypertension Mother   . Anemia Mother   . Asthma Mother   . Cervical cancer Paternal Aunt   . Cervical cancer Paternal Grandmother   . Diabetes Paternal Grandmother   . Diabetes Paternal Grandfather   . Diabetes Paternal Aunt   . Asthma Brother   . Asthma Daughter   . Asthma Son     Social History Social History   Tobacco Use  . Smoking status: Never Smoker  . Smokeless tobacco: Never Used  Substance Use Topics  . Alcohol use: Yes    Alcohol/week: 0.0 oz    Comment: socially  . Drug use: No     Allergies   Ginger   Review of Systems Review of Systems  Musculoskeletal: Positive for arthralgias. Negative for joint swelling.  Skin: Negative for wound.  Neurological: Negative for weakness and numbness.     Physical Exam Updated Vital Signs BP 120/80 (BP Location: Right Arm)   Pulse 84   Temp 97.8 F (36.6 C) (Oral)   Resp 16   SpO2 100%   Physical Exam   Constitutional: She is oriented to person, place, and time. She appears well-developed and well-nourished. No distress.  HENT:  Head: Normocephalic and atraumatic.  Eyes: Conjunctivae are normal. Pupils are equal, round, and reactive to light. Right eye exhibits no discharge. Left eye exhibits no discharge. No scleral icterus.  Neck: Normal range of motion.  Cardiovascular: Normal rate.  Pulmonary/Chest: Effort normal. No respiratory distress.  Abdominal: She exhibits no distension.  Musculoskeletal:  Left hand: No obvious swelling, deformity, or warmth. Tenderness to palpation of 3rd MCP joint and PIP joint. Able to wiggle fingers back and  forth. N/V intact.   Neurological: She is alert and oriented to person, place, and time.  Skin: Skin is warm and dry.  Psychiatric: She has a normal mood and affect. Her behavior is normal.  Nursing note and vitals reviewed.    ED Treatments / Results  Labs (all labs ordered are listed, but only abnormal results are displayed) Labs Reviewed - No data to display  EKG  EKG Interpretation None       Radiology Dg Hand Complete Left  Result Date: 01/03/2018 CLINICAL DATA:  Left third MCP joint and finger pain. EXAM: LEFT HAND - COMPLETE 3+ VIEW COMPARISON:  08/29/2004 FINDINGS: Healed fracture deformity of left fifth proximal phalanx. No new fracture or joint dislocations. No soft tissue mass or mineralization. No erosive change. IMPRESSION: Healed fracture deformity of the left proximal phalanx. No acute osseous abnormality nor joint dislocations. Electronically Signed   By: Tollie Eth M.D.   On: 01/03/2018 21:44    Procedures Procedures (including critical care time)  Medications Ordered in ED Medications  ibuprofen (ADVIL,MOTRIN) tablet 600 mg (600 mg Oral Given 01/04/18 0505)     Initial Impression / Assessment and Plan / ED Course  I have reviewed the triage vital signs and the nursing notes.  Pertinent labs & imaging results that  were available during my care of the patient were reviewed by me and considered in my medical decision making (see chart for details).  32 year old female presents with middle finger sprain. Xray negative for acute fracture, although old fracture is noted which patient states was years ago and is not bothering her currently. No evidence of infection. Will place in finger splint and advised f/u with PCP as needed. Small amount of pain medicine given and advised continue Ibuprofen.  Final Clinical Impressions(s) / ED Diagnoses   Final diagnoses:  Sprain of metacarpophalangeal joint of right middle finger, initial encounter    ED Discharge Orders        Ordered    HYDROcodone-acetaminophen (NORCO/VICODIN) 5-325 MG tablet  Every 4 hours PRN     01/04/18 0509       Bethel Born, PA-C 01/05/18 0009    Dione Booze, MD 01/05/18 [phone]

## 2018-01-04 NOTE — Discharge Instructions (Signed)
Continue Ibuprofen Take pain medicine as needed for severe pain Wear finger brace as needed Follow up with your doctor

## 2018-01-04 NOTE — ED Notes (Addendum)
Finger splint placed left middle finger, ice packs given. Pain score 10 out 10  Pt edu about not having it the splint on two tight and educated about risks for compartment syndrome with splint , remove splint if blue, numbness or pain increases to area.

## 2019-02-07 ENCOUNTER — Inpatient Hospital Stay (HOSPITAL_COMMUNITY)
Admission: EM | Admit: 2019-02-07 | Discharge: 2019-02-12 | DRG: 193 | Disposition: A | Discharge status: Home / Self Care | Payer: BLUE CROSS/BLUE SHIELD | Attending: Internal Medicine | Admitting: Internal Medicine

## 2019-02-07 ENCOUNTER — Emergency Department (HOSPITAL_COMMUNITY): Payer: BLUE CROSS/BLUE SHIELD

## 2019-02-07 ENCOUNTER — Encounter (HOSPITAL_COMMUNITY): Payer: Self-pay | Admitting: Emergency Medicine

## 2019-02-07 DIAGNOSIS — Z791 Long term (current) use of non-steroidal anti-inflammatories (NSAID): Secondary | ICD-10-CM

## 2019-02-07 DIAGNOSIS — J4521 Mild intermittent asthma with (acute) exacerbation: Secondary | ICD-10-CM | POA: Diagnosis not present

## 2019-02-07 DIAGNOSIS — I10 Essential (primary) hypertension: Secondary | ICD-10-CM | POA: Diagnosis present

## 2019-02-07 DIAGNOSIS — Z833 Family history of diabetes mellitus: Secondary | ICD-10-CM | POA: Diagnosis not present

## 2019-02-07 DIAGNOSIS — J4541 Moderate persistent asthma with (acute) exacerbation: Secondary | ICD-10-CM | POA: Diagnosis not present

## 2019-02-07 DIAGNOSIS — Z8249 Family history of ischemic heart disease and other diseases of the circulatory system: Secondary | ICD-10-CM | POA: Diagnosis not present

## 2019-02-07 DIAGNOSIS — Z825 Family history of asthma and other chronic lower respiratory diseases: Secondary | ICD-10-CM

## 2019-02-07 DIAGNOSIS — Z8673 Personal history of transient ischemic attack (TIA), and cerebral infarction without residual deficits: Secondary | ICD-10-CM | POA: Diagnosis not present

## 2019-02-07 DIAGNOSIS — I252 Old myocardial infarction: Secondary | ICD-10-CM | POA: Diagnosis not present

## 2019-02-07 DIAGNOSIS — R000 Tachycardia, unspecified: Secondary | ICD-10-CM | POA: Diagnosis not present

## 2019-02-07 DIAGNOSIS — Z8049 Family history of malignant neoplasm of other genital organs: Secondary | ICD-10-CM

## 2019-02-07 DIAGNOSIS — J4552 Severe persistent asthma with status asthmaticus: Secondary | ICD-10-CM | POA: Diagnosis not present

## 2019-02-07 DIAGNOSIS — J45901 Unspecified asthma with (acute) exacerbation: Secondary | ICD-10-CM | POA: Diagnosis present

## 2019-02-07 DIAGNOSIS — Z832 Family history of diseases of the blood and blood-forming organs and certain disorders involving the immune mechanism: Secondary | ICD-10-CM | POA: Diagnosis not present

## 2019-02-07 DIAGNOSIS — D509 Iron deficiency anemia, unspecified: Secondary | ICD-10-CM | POA: Diagnosis not present

## 2019-02-07 DIAGNOSIS — J101 Influenza due to other identified influenza virus with other respiratory manifestations: Principal | ICD-10-CM | POA: Diagnosis present

## 2019-02-07 DIAGNOSIS — Z79899 Other long term (current) drug therapy: Secondary | ICD-10-CM

## 2019-02-07 DIAGNOSIS — J4540 Moderate persistent asthma, uncomplicated: Secondary | ICD-10-CM

## 2019-02-07 DIAGNOSIS — Z7951 Long term (current) use of inhaled steroids: Secondary | ICD-10-CM

## 2019-02-07 DIAGNOSIS — J9601 Acute respiratory failure with hypoxia: Secondary | ICD-10-CM

## 2019-02-07 DIAGNOSIS — R0602 Shortness of breath: Secondary | ICD-10-CM | POA: Diagnosis not present

## 2019-02-07 DIAGNOSIS — R05 Cough: Secondary | ICD-10-CM | POA: Diagnosis not present

## 2019-02-07 LAB — BLOOD GAS, ARTERIAL
Acid-base deficit: 4.2 mmol/L — ABNORMAL HIGH (ref 0.0–2.0)
BICARBONATE: 18.3 mmol/L — AB (ref 20.0–28.0)
Drawn by: 103701
FIO2: 21
O2 Saturation: 97.3 %
Patient temperature: 98.6
pCO2 arterial: 26.6 mmHg — ABNORMAL LOW (ref 32.0–48.0)
pH, Arterial: 7.452 — ABNORMAL HIGH (ref 7.350–7.450)
pO2, Arterial: 89.4 mmHg (ref 83.0–108.0)

## 2019-02-07 LAB — CBC WITH DIFFERENTIAL/PLATELET
Abs Immature Granulocytes: 0.01 10*3/uL (ref 0.00–0.07)
BASOS ABS: 0 10*3/uL (ref 0.0–0.1)
Basophils Relative: 0 %
EOS PCT: 0 %
Eosinophils Absolute: 0 10*3/uL (ref 0.0–0.5)
HEMATOCRIT: 39.4 % (ref 36.0–46.0)
HEMOGLOBIN: 11.9 g/dL — AB (ref 12.0–15.0)
IMMATURE GRANULOCYTES: 0 %
LYMPHS ABS: 1.3 10*3/uL (ref 0.7–4.0)
LYMPHS PCT: 33 %
MCH: 25.8 pg — ABNORMAL LOW (ref 26.0–34.0)
MCHC: 30.2 g/dL (ref 30.0–36.0)
MCV: 85.3 fL (ref 80.0–100.0)
Monocytes Absolute: 0.5 10*3/uL (ref 0.1–1.0)
Monocytes Relative: 12 %
NRBC: 0 % (ref 0.0–0.2)
Neutro Abs: 2.1 10*3/uL (ref 1.7–7.7)
Neutrophils Relative %: 55 %
Platelets: 220 10*3/uL (ref 150–400)
RBC: 4.62 MIL/uL (ref 3.87–5.11)
RDW: 14.6 % (ref 11.5–15.5)
WBC: 3.9 10*3/uL — ABNORMAL LOW (ref 4.0–10.5)

## 2019-02-07 LAB — BASIC METABOLIC PANEL
ANION GAP: 11 (ref 5–15)
BUN: 7 mg/dL (ref 6–20)
CHLORIDE: 105 mmol/L (ref 98–111)
CO2: 19 mmol/L — AB (ref 22–32)
Calcium: 8.8 mg/dL — ABNORMAL LOW (ref 8.9–10.3)
Creatinine, Ser: 0.97 mg/dL (ref 0.44–1.00)
GFR calc non Af Amer: >60 mL/min (ref >60)
Glucose, Bld: 122 mg/dL — ABNORMAL HIGH (ref 70–99)
POTASSIUM: 3.9 mmol/L (ref 3.5–5.1)
Sodium: 135 mmol/L (ref 135–145)

## 2019-02-07 LAB — MRSA PCR SCREENING: MRSA by PCR: NEGATIVE

## 2019-02-07 MED ORDER — GUAIFENESIN-DM 100-10 MG/5ML PO SYRP
5.0000 mL | ORAL_SOLUTION | ORAL | Status: DC | PRN
  Administered 2019-02-07 – 2019-02-09 (×3): 5 mL via ORAL
  Filled 2019-02-07 (×3): qty 10

## 2019-02-07 MED ORDER — KETOROLAC TROMETHAMINE 30 MG/ML IJ SOLN
30.0000 mg | Freq: Once | INTRAMUSCULAR | Status: AC
  Administered 2019-02-07: 30 mg via INTRAVENOUS
  Filled 2019-02-07: qty 1

## 2019-02-07 MED ORDER — SODIUM CHLORIDE 0.9 % IV SOLN: INTRAVENOUS | Status: DC

## 2019-02-07 MED ORDER — ENOXAPARIN SODIUM 40 MG/0.4ML ~~LOC~~ SOLN
40.0000 mg | SUBCUTANEOUS | Status: DC
  Administered 2019-02-07 – 2019-02-11 (×5): 40 mg via SUBCUTANEOUS
  Filled 2019-02-07 (×5): qty 0.4

## 2019-02-07 MED ORDER — BUDESONIDE 0.25 MG/2ML IN SUSP
0.2500 mg | Freq: Two times a day (BID) | RESPIRATORY_TRACT | Status: DC
Start: 2019-02-07 — End: 2019-02-12
  Administered 2019-02-07 – 2019-02-12 (×10): 0.25 mg via RESPIRATORY_TRACT
  Filled 2019-02-07 (×11): qty 2

## 2019-02-07 MED ORDER — ALBUTEROL (5 MG/ML) CONTINUOUS INHALATION SOLN
10.0000 mg/h | INHALATION_SOLUTION | Freq: Once | RESPIRATORY_TRACT | Status: AC
  Administered 2019-02-07: 10 mg/h via RESPIRATORY_TRACT
  Filled 2019-02-07: qty 20

## 2019-02-07 MED ORDER — SODIUM CHLORIDE 0.9 % IV BOLUS
1000.0000 mL | Freq: Once | INTRAVENOUS | Status: AC
  Administered 2019-02-07: 1000 mL via INTRAVENOUS

## 2019-02-07 MED ORDER — METHYLPREDNISOLONE SODIUM SUCC 125 MG IJ SOLR
125.0000 mg | Freq: Once | INTRAMUSCULAR | Status: AC
  Administered 2019-02-07: 125 mg via INTRAVENOUS
  Filled 2019-02-07: qty 2

## 2019-02-07 MED ORDER — IPRATROPIUM BROMIDE 0.02 % IN SOLN
0.5000 mg | Freq: Four times a day (QID) | RESPIRATORY_TRACT | Status: DC
  Administered 2019-02-07 – 2019-02-08 (×2): 0.5 mg via RESPIRATORY_TRACT
  Filled 2019-02-07 (×2): qty 2.5

## 2019-02-07 MED ORDER — MAGNESIUM SULFATE 2 GM/50ML IV SOLN
2.0000 g | Freq: Once | INTRAVENOUS | Status: AC
  Administered 2019-02-07: 2 g via INTRAVENOUS
  Filled 2019-02-07: qty 50

## 2019-02-07 MED ORDER — METHYLPREDNISOLONE SODIUM SUCC 125 MG IJ SOLR
60.0000 mg | Freq: Two times a day (BID) | INTRAMUSCULAR | Status: DC
  Administered 2019-02-07 – 2019-02-12 (×10): 60 mg via INTRAVENOUS
  Filled 2019-02-07 (×10): qty 2

## 2019-02-07 MED ORDER — LEVALBUTEROL HCL 0.63 MG/3ML IN NEBU
0.6300 mg | INHALATION_SOLUTION | Freq: Four times a day (QID) | RESPIRATORY_TRACT | Status: DC
  Administered 2019-02-07 – 2019-02-08 (×2): 0.63 mg via RESPIRATORY_TRACT
  Filled 2019-02-07 (×2): qty 3

## 2019-02-07 MED ORDER — ALBUTEROL SULFATE (2.5 MG/3ML) 0.083% IN NEBU
5.0000 mg | INHALATION_SOLUTION | Freq: Once | RESPIRATORY_TRACT | Status: AC
  Administered 2019-02-07: 5 mg via RESPIRATORY_TRACT
  Filled 2019-02-07: qty 6

## 2019-02-07 NOTE — ED Provider Notes (Signed)
[facility]-EMERGENCY DEPT Provider Note   CSN: [id number] Arrival date & time: 02/07/19  [id number]     History   Chief Complaint Chief Complaint  Patient presents with  . Shortness of Breath  . Cough  . Nasal Congestion    HPI Melissa [name] is a 33 y.o. female.  33 year old female with history of asthma presents with 24 hours of cough and congestion consistent with her prior asthma exacerbations.  Has been home medicating with albuterol 2.5 mg every 4 hours without relief.  No fever or chills.  Does have a prior history of intubation due to her asthma.  States she feels that the weather precipitated this current attack.  No vomiting or diarrhea.  Has had temporary relief of her symptoms with albuterol therapy.     Past Medical History:  Diagnosis Date  . Abnormal Pap smear 2010   Repeat pap done;Hysteroscopy;Last pap 2012;was normal  . Anemia 2010   Iron supplements PP  . Asthma    Has a nebulizer;triggered by stress  . Colon polyps   . Headache(784.0)   . Heart attack [facility](HCC)    MD thinks may have had a heart attack;Cardiologist @ [facility]  . History of CVA (cerebrovascular accident) 04/09/2013   Pt reports presumptive "stroke" when she was 33y.o.; [facility] "ch[facility] pain" and has been Rx'd Hydrocodone and Mobic  . Infection    BV;may get frequently  . Nerve pain    Chronic;d/t possible stroke and MI  . Placenta previa antepartum 2010   Placenta moved 3 days before baby was born  . Sickle cell trait (HCC)   . Stroke [facility](HCC) 2013   MD thought may have had a stroke and heart attack;has neurologist @ [facility]    Patient Active Problem List   Diagnosis Date Noted  . Abnormal vaginal bleeding 05/22/2016  . Mild persistent chronic asthma without complication 09/22/2015  . Laryngopharyngeal reflux (LPR)   . Dyspnea   . Physical deconditioning   . Chronic hypertension complicating or reason for care during pregnancy  11/26/2014  . Positive GBS test 11/26/2014  . Chronic pain syndrome 11/26/2014  . Obesity (BMI 30-39.9) 11/26/2014  . History of CVA (cerebrovascular accident) 04/09/2013  . Hx Endometrial polyp 02/16/2013  . Sickle cell trait (HCC) 02/16/2013  . Severe persistent asthma with allergic rhinitis with status asthmaticus 02/16/2013  . Migraines 02/16/2013    Past Surgical History:  Procedure Laterality Date  . HYSTEROSCOPY  2010     OB History    Gravida  3   Para  3   Term  3   Preterm      AB      Living  3     SAB      TAB      Ectopic      Multiple  0   Live Births  3            Home Medications    Prior to Admission medications   Medication Sig Start Date End Date Taking? Authorizing Provider  albuterol (PROVENTIL HFA;VENTOLIN HFA) 108 (90 BASE) MCG/ACT inhaler Inhale 2 puffs into the lungs every 4 (four) hours as needed for wheezing or shortness of breath. 08/29/15   Simonne MartinetBabcock, Peter E, NP  albuterol (PROVENTIL) (2.5 MG/3ML) 0.083% nebulizer solution Take 2.5 mg by nebulization every 4 (four) hours as needed for wheezing or shortness of breath.    [provider]  amLODipine (NORVASC)  5 MG tablet Take 1 tablet (5 mg total) by mouth daily. 08/29/15   Simonne Martinet, NP  etonogestrel (NEXPLANON) 68 MG IMPL implant 1 each by Subdermal route once.    [provider]  famotidine (PEPCID) 20 MG tablet One at bedtime 09/22/15   Nyoka Cowden, MD  ferrous sulfate 325 (65 FE) MG tablet Take 1 tablet (325 mg total) by mouth 2 (two) times daily with a meal. 12/01/14   Standard, Venus, CNM  fexofenadine (ALLEGRA) 180 MG tablet Take 180 mg by mouth daily as needed for allergies or rhinitis.    [provider]  fluticasone (FLONASE) 50 MCG/ACT nasal spray Place 2 sprays into both nostrils daily. 08/29/15   Simonne Martinet, NP  HYDROcodone-acetaminophen (NORCO/VICODIN) 5-325 MG tablet Take 1 tablet by mouth every 4 (four) hours as needed. 01/04/18    Bethel Born, PA-C  ibuprofen (ADVIL,MOTRIN) 600 MG tablet Take 600 mg by mouth every 6 (six) hours as needed.    [provider]  medroxyPROGESTERone (PROVERA) 10 MG tablet Take 1 tablet (10 mg total) by mouth daily. 05/23/16   Sherre Scarlet, CNM  mometasone-formoterol (DULERA) 200-5 MCG/ACT AERO Inhale 2 puffs into the lungs 2 (two) times daily. 08/29/15   Simonne Martinet, NP  montelukast (SINGULAIR) 10 MG tablet Take 1 tablet (10 mg total) by mouth at bedtime. 09/08/15   Nyoka Cowden, MD  omeprazole (PRILOSEC OTC) 20 MG tablet Take 1 tablet (20 mg total) by mouth daily. 09/01/15   Simonne Martinet, NP  promethazine-codeine (PHENERGAN WITH CODEINE) 6.25-10 MG/5ML syrup Take 5 mLs by mouth every 4 (four) hours as needed for cough. 09/01/15   Simonne Martinet, NP  rizatriptan (MAXALT-MLT) 5 MG disintegrating tablet Take 1 tablet (5 mg total) by mouth as needed. May repeat in 2 hours if needed 08/10/15   Levert Feinstein, MD  vitamin C (ASCORBIC ACID) 500 MG tablet Take 1 tablet (500 mg total) by mouth daily. 05/22/16   Sherre Scarlet, CNM    Family History Family History  Problem Relation Age of Onset  . Sickle cell trait Father   . Hypertension Mother   . Anemia Mother   . Asthma Mother   . Cervical cancer Paternal Aunt   . Cervical cancer Paternal Grandmother   . Diabetes Paternal Grandmother   . Diabetes Paternal Grandfather   . Diabetes Paternal Aunt   . Asthma Brother   . Asthma Daughter   . Asthma Son     Social History Social History   Tobacco Use  . Smoking status: Never Smoker  . Smokeless tobacco: Never Used  Substance Use Topics  . Alcohol use: Yes    Alcohol/week: 0.0 standard drinks    Comment: socially  . Drug use: No     Allergies   Ginger   Review of Systems Review of Systems  All other systems reviewed and are negative.    Physical Exam Updated Vital Signs BP 129/89 (BP Location: Right Arm)   Pulse (!) 137   Temp 98.4 F (36.9 C)  (Oral)   Resp (!) 24   SpO2 100%   Physical Exam Vitals signs and nursing note reviewed.  Constitutional:      General: She is not in acute distress.    Appearance: Normal appearance. She is well-developed. She is not toxic-appearing.  HENT:     Head: Normocephalic and atraumatic.  Eyes:     General: Lids are normal.  Conjunctiva/sclera: Conjunctivae normal.     Pupils: Pupils are equal, round, and reactive to light.  Neck:     Musculoskeletal: Normal range of motion and neck supple.     Thyroid: No thyroid mass.     Trachea: No tracheal deviation.  Cardiovascular:     Rate and Rhythm: Regular rhythm. Tachycardia present.     Heart sounds: Normal heart sounds. No murmur. No gallop.   Pulmonary:     Effort: Pulmonary effort is normal. No respiratory distress.     Breath sounds: No stridor. Examination of the right-upper field reveals decreased breath sounds and wheezing. Examination of the left-upper field reveals decreased breath sounds and wheezing. Decreased breath sounds and wheezing present. No rhonchi or rales.  Abdominal:     General: Bowel sounds are normal. There is no distension.     Palpations: Abdomen is soft.     Tenderness: There is no abdominal tenderness. There is no rebound.  Musculoskeletal: Normal range of motion.        General: No tenderness.  Skin:    General: Skin is warm and dry.     Findings: No abrasion or rash.  Neurological:     Mental Status: She is alert and oriented to person, place, and time.     GCS: GCS eye subscore is 4. GCS verbal subscore is 5. GCS motor subscore is 6.     Cranial Nerves: No cranial nerve deficit.     Sensory: No sensory deficit.  Psychiatric:        Speech: Speech normal.        Behavior: Behavior normal.      ED Treatments / Results  Labs (all labs ordered are listed, but only abnormal results are displayed) Labs Reviewed  CBC WITH DIFFERENTIAL/PLATELET  BASIC METABOLIC PANEL    EKG EKG  Interpretation  Date/Time:  Saturday February 07 2019 10:20:52 EST Ventricular Rate:  122 PR Interval:    QRS Duration: 80 QT Interval:  314 QTC Calculation: 448 R Axis:   63 Text Interpretation:  Sinus tachycardia Multiform ventricular premature complexes Aberrant complex Confirmed by Lorre Nick ([id number]) on 02/07/2019 10:29:08 AM   Radiology No results found.  Procedures Procedures (including critical care time)  Medications Ordered in ED Medications  albuterol (PROVENTIL) (2.5 MG/3ML) 0.083% nebulizer solution 5 mg (has no administration in time range)  sodium chloride 0.9 % bolus 1,000 mL (has no administration in time range)  0.9 %  sodium chloride infusion (has no administration in time range)  methylPREDNISolone sodium succinate (SOLU-MEDROL) 125 mg/2 mL injection 125 mg (has no administration in time range)  magnesium sulfate IVPB 2 g 50 mL (has no administration in time range)  albuterol (PROVENTIL,VENTOLIN) solution continuous neb (has no administration in time range)     Initial Impression / Assessment and Plan / ED Course  I have reviewed the triage vital signs and the nursing notes.  Pertinent labs & imaging results that were available during my care of the patient were reviewed by me and considered in my medical decision making (see chart for details).     Patient given magnesium and Solu-Medrol.  Given albuterol as well and will admit for asthma exacerbation  Final Clinical Impressions(s) / ED Diagnoses   Final diagnoses:  None    ED Discharge Orders    None       Lorre Nick, MD 02/07/19 1234

## 2019-02-07 NOTE — ED Notes (Signed)
ED TO INPATIENT HANDOFF REPORT  Name/Age/Gender Melissa [name] 33 y.o. female  Code Status Code Status History    Date Active Date Inactive Code Status Order ID Comments User Context   08/26/2015 0954 09/01/2015 1928 Full Code [id number]  Lupita Leash, MD Inpatient   11/29/2014 2227 12/01/2014 1409 Full Code [id number]  Nigel Bridgeman, CNM Inpatient   11/29/2014 9458 11/29/2014 2227 Full Code [id number]  Nigel Bridgeman, CNM Inpatient   10/29/2013 2333 11/01/2013 1722 Full Code [id number]  Eduard Clos, MD Inpatient   10/29/2013 0226 10/29/2013 2333 Full Code [id number]  Haroldine Laws, CNM Inpatient   10/19/2013 1512 10/20/2013 2229 Full Code [id number]  Purcell Nails, MD Inpatient      Home/SNF/Other Home  Chief Complaint shortness of breath  Level of Care/Admitting Diagnosis ED Disposition    ED Disposition Condition Comment   Admit  Hospital Area: [facility] [[id number]]  Level of Care: Stepdown [14]  Admit to SDU based on following criteria: Respiratory Distress:  Frequent assessment and/or intervention to maintain adequate ventilation/respiration, pulmonary toilet, and respiratory treatment.  Diagnosis: Asthma exacerbation [916606]  Admitting Physician: Leatha Gilding [[id number]]  Attending Physician: Leatha Gilding [[id number]]  PT Class (Do Not Modify): Observation [104]  PT Acc Code (Do Not Modify): Observation [10022]       Medical History Past Medical History:  Diagnosis Date  . Abnormal Pap smear 2010   Repeat pap done;Hysteroscopy;Last pap 2012;was normal  . Anemia 2010   Iron supplements PP  . Asthma    Has a nebulizer;triggered by stress  . Colon polyps   . Headache(784.0)   . Heart attack [facility])    MD thinks may have had a heart attack;Cardiologist @ [facility]  . History of CVA (cerebrovascular accident) 04/09/2013   Pt reports presumptive "stroke" when she was 33y.o.; followed by [facility] neurology for "chronic nerve pain" and has been Rx'd  Hydrocodone and Mobic  . Infection    BV;may get frequently  . Nerve pain    Chronic;d/t possible stroke and MI  . Placenta previa antepartum 2010   Placenta moved 3 days before baby was born  . Sickle cell trait (HCC)   . Stroke [facility]) 2013   MD thought may have had a stroke and heart attack;has neurologist @ [facility]    Allergies Allergies  Allergen Reactions  . Ginger Anaphylaxis    IV Location/Drains/Wounds Patient Lines/Drains/Airways Status   Active Line/Drains/Airways    Name:   Placement date:   Placement time:   Site:   Days:   Peripheral IV 02/07/19 Left Antecubital   02/07/19    1023    Antecubital   less than 1          Labs/Imaging Results for orders placed or performed during the hospital encounter of 02/07/19 (from the past 48 hour(s))  CBC with Differential/Platelet     Status: Abnormal   Collection Time: 02/07/19 10:03 AM  Result Value Ref Range   WBC 3.9 (L) 4.0 - 10.5 K/uL   RBC 4.62 3.87 - 5.11 MIL/uL   Hemoglobin 11.9 (L) 12.0 - 15.0 g/dL   HCT 00.4 59.9 - 77.4 %   MCV 85.3 80.0 - 100.0 fL   MCH 25.8 (L) 26.0 - 34.0 pg   MCHC 30.2 30.0 - 36.0 g/dL   RDW 14.2 39.5 - 32.0 %   Platelets 220 150 - 400 K/uL   nRBC 0.0 0.0 - 0.2 %   Neutrophils Relative %  55 %   Neutro Abs 2.1 1.7 - 7.7 K/uL   Lymphocytes Relative 33 %   Lymphs Abs 1.3 0.7 - 4.0 K/uL   Monocytes Relative 12 %   Monocytes Absolute 0.5 0.1 - 1.0 K/uL   Eosinophils Relative 0 %   Eosinophils Absolute 0.0 0.0 - 0.5 K/uL   Basophils Relative 0 %   Basophils Absolute 0.0 0.0 - 0.1 K/uL   Immature Granulocytes 0 %   Abs Immature Granulocytes 0.01 0.00 - 0.07 K/uL    Comment: Performed at [facility], [address] [place], [place] [address]  Basic metabolic panel     Status: Abnormal   Collection Time: 02/07/19 10:03 AM  Result Value Ref Range   Sodium 135 135 - 145 mmol/L   Potassium 3.9 3.5 - 5.1 mmol/L   Chloride 105 98 - 111 mmol/L   CO2 19  (L) 22 - 32 mmol/L   Glucose, Bld 122 (H) 70 - 99 mg/dL   BUN 7 6 - 20 mg/dL   Creatinine, Ser 7.20 0.44 - 1.00 mg/dL   Calcium 8.8 (L) 8.9 - 10.3 mg/dL   GFR calc non Af Amer >60 >60 mL/min   GFR calc Af Amer >60 >60 mL/min   Anion gap 11 5 - 15    Comment: Performed at [facility], [address], [place], [place] [address]  Blood gas, arterial     Status: Abnormal   Collection Time: 02/07/19  3:08 PM  Result Value Ref Range   FIO2 21.00    Delivery systems ROOM AIR    pH, Arterial 7.452 (H) 7.350 - 7.450   pCO2 arterial 26.6 (L) 32.0 - 48.0 mmHg   pO2, Arterial 89.4 83.0 - 108.0 mmHg   Bicarbonate 18.3 (L) 20.0 - 28.0 mmol/L   Acid-base deficit 4.2 (H) 0.0 - 2.0 mmol/L   O2 Saturation 97.3 %   Patient temperature 98.6    Collection site RIGHT RADIAL    Drawn by [id number]    Sample type ARTERIAL    Allens test (pass/fail) PASS PASS    Comment: Performed at [facility], [address], [place], [place] [address]   Dg Chest 2 View  Result Date: 02/07/2019 CLINICAL DATA:  Shortness of breath with cough and congestion EXAM: CHEST - 2 VIEW COMPARISON:  September 22, 2015 FINDINGS: There is no edema or consolidation. The heart size and pulmonary vascularity are normal. No adenopathy. No bone lesions. IMPRESSION: No edema or consolidation. Electronically Signed   By: Bretta Bang III M.D.   On: 02/07/2019 10:14   EKG Interpretation  Date/Time:  Saturday February 07 2019 10:20:52 EST Ventricular Rate:  122 PR Interval:    QRS Duration: 80 QT Interval:  314 QTC Calculation: 448 R Axis:   63 Text Interpretation:  Sinus tachycardia Multiform ventricular premature complexes Aberrant complex Confirmed by Lorre Nick ([id number]) on 02/07/2019 10:29:08 AM   Pending Labs Unresulted Labs (From admission, onward)    Start     Ordered   02/07/19 1259  Respiratory Panel by PCR  (Respiratory virus panel with precautions)  Once,   R     02/07/19 1258    Signed and Held  HIV antibody (Routine Testing)  Once,   R     Signed and Held   Signed and Held  Comprehensive metabolic panel  Tomorrow morning,   R     Signed and Held   Medical illustrator and Consulting civil engineer  metabolic panel  Tomorrow morning,   R     Signed and Held          Vitals/Pain Today's Vitals   02/07/19 1600 02/07/19 1615 02/07/19 1630 02/07/19 1645  BP: 121/66 122/73 115/69 126/77  Pulse: (!) 117 (!) 110 (!) 108 (!) 113  Resp: 20 (!) 35 18 (!) 22  Temp:      TempSrc:      SpO2: 99% 98% 98% 99%    Isolation Precautions Droplet precaution  Medications Medications  0.9 %  sodium chloride infusion (has no administration in time range)  levalbuterol (XOPENEX) nebulizer solution 0.63 mg (0.63 mg Nebulization Not Given 02/07/19 1524)  ipratropium (ATROVENT) nebulizer solution 0.5 mg (0.5 mg Nebulization Not Given 02/07/19 1524)  budesonide (PULMICORT) nebulizer solution 0.25 mg (0.25 mg Nebulization Not Given 02/07/19 1523)  methylPREDNISolone sodium succinate (SOLU-MEDROL) 125 mg/2 mL injection 60 mg (has no administration in time range)  guaiFENesin-dextromethorphan (ROBITUSSIN DM) 100-10 MG/5ML syrup 5 mL (has no administration in time range)  albuterol (PROVENTIL) (2.5 MG/3ML) 0.083% nebulizer solution 5 mg (5 mg Nebulization Given 02/07/19 1024)  sodium chloride 0.9 % bolus 1,000 mL (1,000 mLs Intravenous New Bag/Given 02/07/19 1113)  methylPREDNISolone sodium succinate (SOLU-MEDROL) 125 mg/2 mL injection 125 mg (125 mg Intravenous Given 02/07/19 1023)  magnesium sulfate IVPB 2 g 50 mL (0 g Intravenous Stopped 02/07/19 1113)  albuterol (PROVENTIL,VENTOLIN) solution continuous neb (10 mg/hr Nebulization Given 02/07/19 1220)    Mobility walks with person assist

## 2019-02-07 NOTE — ED Notes (Signed)
Pt given ice per request °

## 2019-02-07 NOTE — H&P (Addendum)
History and Physical    Melissa [name]:[id number] DOB: 02/21/1986 DOA: 02/07/2019  I have briefly reviewed the patient's [facility]  PCP: Dorothyann PengSanders, Robyn, MD  Patient coming from: Home  Chief Complaint: Shortness of breath  HPI: Melissa [name] is a 33 y.o. female with medical history significant of mild persistent asthma, followed by Dr. worked with pulmonary, hypertension, iron deficiency anemia, who presents to the hospital with chief complaint of shortness of breath.  She states that over the last 2 days she is experienced progressive shortness of breath and increased wheezing, with a dry cough, tried to use her home inhalers without much success and decided to come to the emergency room.  She denies any fever or chills, denies any sore throat, sinus pressure or runny nose.  She denies any sick contacts.  She denies any productive cough.  She is complaining of chest pain with coughing.  No myalgias or flulike symptoms.  She denies any abdominal pain, nausea vomiting or diarrhea.  ED Course: In the ED she was found to be in significant respiratory distress, hypoxic, with wheezing, she was given Solu-Medrol, magnesium as well as hour-long neb, remains severely dyspneic with wheezing and we are asked to admit. BMP/CBC unremarkable  Review of Systems: As per HPI otherwise 10 point review of systems negative.   Past Medical History:  Diagnosis Date  . Abnormal Pap smear 2010   Repeat pap done;Hysteroscopy;Last pap 2012;was normal  . Anemia 2010   Iron supplements PP  . Asthma    Has a nebulizer;triggered by stress  . Colon polyps   . Headache(784.0)   . Heart attack [facility](HCC)    MD thinks may have had a heart attack;Cardiologist @ [facility]  . History of CVA (cerebrovascular accident) 04/09/2013   Pt reports presumptive "stroke" when she was 33y.o.; [facility] for "chronic nerve pain" and has been Rx'd Hydrocodone and Mobic  . Infection    BV;may  get frequently  . Nerve pain    Chronic;d/t possible stroke and MI  . Placenta previa antepartum 2010   Placenta moved 3 days before baby was born  . Sickle cell trait (HCC)   . Stroke [facility](HCC) 2013   MD thought may have had a stroke and heart attack;has neurologist @ [facility]    Past Surgical History:  Procedure Laterality Date  . HYSTEROSCOPY  2010     reports that she has never smoked. She has never used smokeless tobacco. She reports current alcohol use. She reports that she does not use drugs.  Allergies  Allergen Reactions  . Ginger Anaphylaxis    Family History  Problem Relation Age of Onset  . Sickle cell trait Father   . Hypertension Mother   . Anemia Mother   . Asthma Mother   . Cervical cancer Paternal Aunt   . Cervical cancer Paternal Grandmother   . Diabetes Paternal Grandmother   . Diabetes Paternal Grandfather   . Diabetes Paternal Aunt   . Asthma Brother   . Asthma Daughter   . Asthma Son     Prior to Admission medications   Medication Sig Start Date End Date Taking? Authorizing Provider  albuterol (PROVENTIL HFA;VENTOLIN HFA) 108 (90 BASE) MCG/ACT inhaler Inhale 2 puffs into the lungs every 4 (four) hours as needed for wheezing or shortness of breath. 08/29/15  Yes Simonne MartinetBabcock, Peter E, NP  albuterol (PROVENTIL) (2.5 MG/3ML) 0.083% nebulizer solution Take 2.5 mg by nebulization every  4 (four) hours as needed for wheezing or shortness of breath.   Yes [provider]  etonogestrel (NEXPLANON) 68 MG IMPL implant 1 each by Subdermal route once.   Yes [provider]  ibuprofen (ADVIL,MOTRIN) 600 MG tablet Take 600 mg by mouth every 6 (six) hours as needed.   Yes [provider]  amLODipine (NORVASC) 5 MG tablet Take 1 tablet (5 mg total) by mouth daily. Patient not taking: Reported on 02/07/2019 08/29/15   Simonne MartinetBabcock, Peter E, NP  famotidine (PEPCID) 20 MG tablet One at bedtime Patient not taking: Reported on 02/07/2019 09/22/15    Nyoka CowdenWert, Michael B, MD  ferrous sulfate 325 (65 FE) MG tablet Take 1 tablet (325 mg total) by mouth 2 (two) times daily with a meal. Patient not taking: Reported on 02/07/2019 12/01/14   Standard, Venus, CNM  fluticasone (FLONASE) 50 MCG/ACT nasal spray Place 2 sprays into both nostrils daily. Patient not taking: Reported on 02/07/2019 08/29/15   Simonne MartinetBabcock, Peter E, NP  HYDROcodone-acetaminophen (NORCO/VICODIN) 5-325 MG tablet Take 1 tablet by mouth every 4 (four) hours as needed. Patient not taking: Reported on 02/07/2019 01/04/18   Bethel BornGekas, Kelly Marie, PA-C  medroxyPROGESTERone (PROVERA) 10 MG tablet Take 1 tablet (10 mg total) by mouth daily. Patient not taking: Reported on 02/07/2019 05/23/16   Sherre ScarletWilliams, Kimberly, CNM  mometasone-formoterol [facility](DULERA) 200-5 MCG/ACT AERO Inhale 2 puffs into the lungs 2 (two) times daily. Patient not taking: Reported on 02/07/2019 08/29/15   Simonne MartinetBabcock, Peter E, NP  montelukast (SINGULAIR) 10 MG tablet Take 1 tablet (10 mg total) by mouth at bedtime. Patient not taking: Reported on 02/07/2019 09/08/15   Nyoka CowdenWert, Michael B, MD  omeprazole (PRILOSEC OTC) 20 MG tablet Take 1 tablet (20 mg total) by mouth daily. Patient not taking: Reported on 02/07/2019 09/01/15   Simonne MartinetBabcock, Peter E, NP  promethazine-codeine [facility](PHENERGAN WITH CODEINE) 6.25-10 MG/5ML syrup Take 5 mLs by mouth every 4 (four) hours as needed for cough. Patient not taking: Reported on 02/07/2019 09/01/15   Simonne MartinetBabcock, Peter E, NP  rizatriptan (MAXALT-MLT) 5 MG disintegrating tablet Take 1 tablet (5 mg total) by mouth as needed. May repeat in 2 hours if needed Patient not taking: Reported on 02/07/2019 08/10/15   Levert FeinsteinYan, Yijun, MD  vitamin C (ASCORBIC ACID) 500 MG tablet Take 1 tablet (500 mg total) by mouth daily. Patient not taking: Reported on 02/07/2019 05/22/16   Sherre ScarletWilliams, Kimberly, CNM    Physical Exam: Vitals:   02/07/19 1100 02/07/19 1200 02/07/19 1201 02/07/19 1220  BP: 122/75 (!) 103/59 (!) 103/59   Pulse: (!) 122 (!) 107 (!) 107   Resp: 20  (!) 25 (!) 24   Temp:      TempSrc:      SpO2: 98% 100% 98% 100%    Constitutional: Sitting up, breathing fast, nebulizer mask is on Eyes: PERRL, no scleral icterus ENMT: Mucous membranes are moist.  Neck: normal, supple Respiratory: Diffuse bilateral wheezing throughout entire lung fields, does have good air movement but is tachypneic and uses accessory muscles Cardiovascular: Regular rate and rhythm, no murmurs / rubs / gallops. No extremity edema. 2+ pedal pulses.  Tachycardic Abdomen: no tenderness, no masses palpated. Bowel sounds positive.  Musculoskeletal: no clubbing / cyanosis. Normal muscle tone.  Skin: no rashes Neurologic: CN 2-12 grossly intact. Strength 5/5 in all 4.  Psychiatric: Normal judgment and insight. Alert and oriented x 3. Normal mood.   Labs on Admission: I have personally reviewed following labs and imaging studies  CBC: Recent Labs  Lab 02/07/19 1003  WBC 3.9*  NEUTROABS 2.1  HGB 11.9*  HCT 39.4  MCV 85.3  PLT 220   Basic Metabolic Panel: Recent Labs  Lab 02/07/19 1003  NA 135  K 3.9  CL 105  CO2 19*  GLUCOSE 122*  BUN 7  CREATININE 0.97  CALCIUM 8.8*   GFR: CrCl cannot be calculated (Unknown ideal weight.). Liver Function Tests: No results for input(s): AST, ALT, ALKPHOS, BILITOT, PROT, ALBUMIN in the last 168 hours. No results for input(s): LIPASE, AMYLASE in the last 168 hours. No results for input(s): AMMONIA in the last 168 hours. Coagulation Profile: No results for input(s): INR, PROTIME in the last 168 hours. Cardiac Enzymes: No results for input(s): CKTOTAL, CKMB, CKMBINDEX, TROPONINI in the last 168 hours. BNP (last 3 results) No results for input(s): PROBNP in the last 8760 hours. HbA1C: No results for input(s): HGBA1C in the last 72 hours. CBG: No results for input(s): GLUCAP in the last 168 hours. Lipid Profile: No results for input(s): CHOL, HDL, LDLCALC, TRIG, CHOLHDL, LDLDIRECT in the last 72 hours. Thyroid  Function Tests: No results for input(s): TSH, T4TOTAL, FREET4, T3FREE, THYROIDAB in the last 72 hours. Anemia Panel: No results for input(s): VITAMINB12, FOLATE, FERRITIN, TIBC, IRON, RETICCTPCT in the last 72 hours. Urine analysis:    Component Value Date/Time   COLORURINE YELLOW 07/29/2015 1400   APPEARANCEUR CLOUDY (A) 07/29/2015 1400   LABSPEC 1.028 07/29/2015 1400   PHURINE 5.5 07/29/2015 1400   GLUCOSEU NEGATIVE 07/29/2015 1400   HGBUR SMALL (A) 07/29/2015 1400   BILIRUBINUR NEGATIVE 07/29/2015 1400   BILIRUBINUR neg 02/16/2013 1528   KETONESUR NEGATIVE 07/29/2015 1400   PROTEINUR NEGATIVE 07/29/2015 1400   UROBILINOGEN 0.2 07/29/2015 1400   NITRITE NEGATIVE 07/29/2015 1400   LEUKOCYTESUR MODERATE (A) 07/29/2015 1400     Radiological Exams on Admission: Dg Chest 2 View  Result Date: 02/07/2019 CLINICAL DATA:  Shortness of breath with cough and congestion EXAM: CHEST - 2 VIEW COMPARISON:  September 22, 2015 FINDINGS: There is no edema or consolidation. The heart size and pulmonary vascularity are normal. No adenopathy. No bone lesions. IMPRESSION: No edema or consolidation. Electronically Signed   By: Bretta Bang III M.D.   On: 02/07/2019 10:14    EKG: Independently reviewed. Sinus tachycardia Chest x-ray - no infiltrate seen  Assessment/Plan Principal Problem:   Asthma exacerbation Active Problems:   Severe persistent asthma with allergic rhinitis with status asthmaticus   Acute hypoxemic respiratory failure (HCC)   Principal Problem Acute respiratory distress in the setting of asthma exacerbation -Admit patient to stepdown, she does tell me that she has improved slightly since coming to the ED which is reassuring -Placed on nebulizers with Xopenex/Atrovent given tachycardia, Pulmicort, IV steroids -Even though she had no significant symptoms for the flu will rule out viral etiology by obtaining RVP -I will obtain an ABG  Active  Problems Hypertension -Patient reported that she is not taking Norvasc, hold, blood pressure stable  Iron deficiency anemia -Hemoglobin stable, she is not taking her iron supplements   DVT prophylaxis: Lovenox Code Status: Full code Family Communication: No family present at bedside Disposition Plan: Admit to stepdown, likely home when ready Consults called: None   Pamella Pert, MD, PhD [facility]  Contact via [email or web address]  [facility] Office Info P: [phone]  F: [phone]   02/07/2019, 12:49 PM

## 2019-02-07 NOTE — ED Triage Notes (Signed)
Pt reports SOB and cough and congestion for 2 days. Pt very breathing very fast and heavy in triage and had to be helped out of car. Pt has asthma and hasnt used inhaler but used neb treatment

## 2019-02-08 ENCOUNTER — Other Ambulatory Visit: Payer: Self-pay

## 2019-02-08 DIAGNOSIS — J4541 Moderate persistent asthma with (acute) exacerbation: Secondary | ICD-10-CM | POA: Diagnosis not present

## 2019-02-08 DIAGNOSIS — D509 Iron deficiency anemia, unspecified: Secondary | ICD-10-CM | POA: Diagnosis present

## 2019-02-08 DIAGNOSIS — Z832 Family history of diseases of the blood and blood-forming organs and certain disorders involving the immune mechanism: Secondary | ICD-10-CM | POA: Diagnosis not present

## 2019-02-08 DIAGNOSIS — I252 Old myocardial infarction: Secondary | ICD-10-CM | POA: Diagnosis not present

## 2019-02-08 DIAGNOSIS — J101 Influenza due to other identified influenza virus with other respiratory manifestations: Secondary | ICD-10-CM | POA: Diagnosis not present

## 2019-02-08 DIAGNOSIS — Z8249 Family history of ischemic heart disease and other diseases of the circulatory system: Secondary | ICD-10-CM | POA: Diagnosis not present

## 2019-02-08 DIAGNOSIS — Z8673 Personal history of transient ischemic attack (TIA), and cerebral infarction without residual deficits: Secondary | ICD-10-CM | POA: Diagnosis not present

## 2019-02-08 DIAGNOSIS — I10 Essential (primary) hypertension: Secondary | ICD-10-CM | POA: Diagnosis present

## 2019-02-08 DIAGNOSIS — J9601 Acute respiratory failure with hypoxia: Secondary | ICD-10-CM | POA: Diagnosis not present

## 2019-02-08 DIAGNOSIS — Z8049 Family history of malignant neoplasm of other genital organs: Secondary | ICD-10-CM | POA: Diagnosis not present

## 2019-02-08 DIAGNOSIS — Z825 Family history of asthma and other chronic lower respiratory diseases: Secondary | ICD-10-CM | POA: Diagnosis not present

## 2019-02-08 DIAGNOSIS — Z7951 Long term (current) use of inhaled steroids: Secondary | ICD-10-CM | POA: Diagnosis not present

## 2019-02-08 DIAGNOSIS — Z791 Long term (current) use of non-steroidal anti-inflammatories (NSAID): Secondary | ICD-10-CM | POA: Diagnosis not present

## 2019-02-08 DIAGNOSIS — J4521 Mild intermittent asthma with (acute) exacerbation: Secondary | ICD-10-CM

## 2019-02-08 DIAGNOSIS — Z833 Family history of diabetes mellitus: Secondary | ICD-10-CM | POA: Diagnosis not present

## 2019-02-08 DIAGNOSIS — Z79899 Other long term (current) drug therapy: Secondary | ICD-10-CM | POA: Diagnosis not present

## 2019-02-08 DIAGNOSIS — J4540 Moderate persistent asthma, uncomplicated: Secondary | ICD-10-CM | POA: Diagnosis not present

## 2019-02-08 DIAGNOSIS — J4552 Severe persistent asthma with status asthmaticus: Secondary | ICD-10-CM | POA: Diagnosis present

## 2019-02-08 LAB — COMPREHENSIVE METABOLIC PANEL
ALT: 21 U/L (ref 0–44)
AST: 21 U/L (ref 15–41)
Albumin: 3.5 g/dL (ref 3.5–5.0)
Alkaline Phosphatase: 51 U/L (ref 38–126)
Anion gap: 5 (ref 5–15)
BUN: 11 mg/dL (ref 6–20)
CO2: 21 mmol/L — AB (ref 22–32)
Calcium: 8.5 mg/dL — ABNORMAL LOW (ref 8.9–10.3)
Chloride: 113 mmol/L — ABNORMAL HIGH (ref 98–111)
Creatinine, Ser: 0.73 mg/dL (ref 0.44–1.00)
GFR calc Af Amer: >60 mL/min (ref >60)
GFR calc non Af Amer: >60 mL/min (ref >60)
GLUCOSE: 134 mg/dL — AB (ref 70–99)
Potassium: 4.8 mmol/L (ref 3.5–5.1)
SODIUM: 139 mmol/L (ref 135–145)
Total Bilirubin: 0.4 mg/dL (ref 0.3–1.2)
Total Protein: 7.3 g/dL (ref 6.5–8.1)

## 2019-02-08 LAB — RESPIRATORY PANEL BY PCR
Adenovirus: NOT DETECTED
Bordetella pertussis: NOT DETECTED
Chlamydophila pneumoniae: NOT DETECTED
Coronavirus 229E: NOT DETECTED
Coronavirus HKU1: NOT DETECTED
Coronavirus NL63: NOT DETECTED
Coronavirus OC43: NOT DETECTED
Influenza A H1 2009: DETECTED — AB
Influenza B: NOT DETECTED
MYCOPLASMA PNEUMONIAE-RVPPCR: NOT DETECTED
Metapneumovirus: NOT DETECTED
Parainfluenza Virus 1: NOT DETECTED
Parainfluenza Virus 2: NOT DETECTED
Parainfluenza Virus 3: NOT DETECTED
Parainfluenza Virus 4: NOT DETECTED
Respiratory Syncytial Virus: NOT DETECTED
Rhinovirus / Enterovirus: NOT DETECTED

## 2019-02-08 LAB — HIV ANTIBODY (ROUTINE TESTING W REFLEX): HIV SCREEN 4TH GENERATION: NONREACTIVE

## 2019-02-08 MED ORDER — BENZONATATE 100 MG PO CAPS
200.0000 mg | ORAL_CAPSULE | Freq: Three times a day (TID) | ORAL | Status: DC
  Administered 2019-02-08 – 2019-02-12 (×12): 200 mg via ORAL
  Filled 2019-02-08 (×12): qty 2

## 2019-02-08 MED ORDER — IPRATROPIUM-ALBUTEROL 0.5-2.5 (3) MG/3ML IN SOLN
3.0000 mL | Freq: Four times a day (QID) | RESPIRATORY_TRACT | Status: DC
  Administered 2019-02-08 – 2019-02-09 (×7): 3 mL via RESPIRATORY_TRACT
  Filled 2019-02-08 (×6): qty 3

## 2019-02-08 MED ORDER — SODIUM CHLORIDE 0.9 % IV SOLN
INTRAVENOUS | Status: DC
  Administered 2019-02-08 – 2019-02-09 (×2): via INTRAVENOUS

## 2019-02-08 MED ORDER — IBUPROFEN 200 MG PO TABS
600.0000 mg | ORAL_TABLET | Freq: Four times a day (QID) | ORAL | Status: DC | PRN
  Administered 2019-02-08 – 2019-02-11 (×4): 600 mg via ORAL
  Filled 2019-02-08 (×4): qty 3

## 2019-02-08 MED ORDER — ALBUTEROL SULFATE (2.5 MG/3ML) 0.083% IN NEBU
2.5000 mg | INHALATION_SOLUTION | RESPIRATORY_TRACT | Status: DC | PRN
  Administered 2019-02-10: 2.5 mg via RESPIRATORY_TRACT
  Filled 2019-02-08 (×2): qty 3

## 2019-02-08 MED ORDER — ACETAMINOPHEN 325 MG PO TABS
650.0000 mg | ORAL_TABLET | Freq: Four times a day (QID) | ORAL | Status: DC | PRN
  Administered 2019-02-08 – 2019-02-12 (×7): 650 mg via ORAL
  Filled 2019-02-08 (×7): qty 2

## 2019-02-08 MED ORDER — ACETAMINOPHEN 325 MG PO TABS
650.0000 mg | ORAL_TABLET | Freq: Four times a day (QID) | ORAL | Status: DC | PRN
  Administered 2019-02-08: 650 mg via ORAL
  Filled 2019-02-08: qty 2

## 2019-02-08 MED ORDER — OSELTAMIVIR PHOSPHATE 75 MG PO CAPS
75.0000 mg | ORAL_CAPSULE | Freq: Two times a day (BID) | ORAL | Status: DC
  Administered 2019-02-08 – 2019-02-12 (×9): 75 mg via ORAL
  Filled 2019-02-08 (×9): qty 1

## 2019-02-08 NOTE — Progress Notes (Addendum)
PROGRESS NOTE   Melissa [name]  OZH:[id number]    DOB: 12/19/1986    DOA: 02/07/2019  PCP: Dorothyann PengSanders, Robyn, MD   I have briefly reviewed patients previous medical [facility].  Brief Narrative:  33 year old female with PMH of mild persistent asthma, iron deficiency anemia, reported MI and CVA, HTN, presented to ED with 2 days history of dry cough, progressively worsening dyspnea and wheezing that failed home nebulizer treatment.  Admitted for acute asthma exacerbation in the context of acute viral/influenza A bronchitis.  Improving.   Assessment & Plan:   Principal Problem:   Asthma exacerbation Active Problems:   Severe persistent asthma with allergic rhinitis with status asthmaticus   Acute hypoxemic respiratory failure (HCC)   1. Influenza A acute bronchitis: Patient was placed on droplet isolation.  RSV panel came back positive for influenza A.  Started Tamiflu 2/9 and complete 5 days course.  Symptomatic/supportive treatment. 2. Acute asthma exacerbation: Precipitated by influenza.  In ED, treated with Solu-Medrol, magnesium, hour-long nebulization but remained severely dyspneic with wheezing.  Admitted to stepdown unit overnight.  Continued IV Solu-Medrol 60 mg every 12, Pulmicort nebulizations, bronchodilator nebulizations.  Improved.  On room air.  Continue current management for additional 24 hours.  Reportedly follows with Dr. Sherene SiresWert, Pulmonology. 3. Acute respiratory failure with hypoxia: Secondary to above.  Seems to have resolved.  Monitor closely. 4. Iron deficiency anemia: Stable.  Outpatient follow-up. 5. Essential hypertension: Controlled off of medications at home.  Monitor.   DVT prophylaxis: Lovenox Code Status: Full Family Communication: None at bedside Disposition: Admitted overnight to stepdown unit.  Clinically improved.  Transfer to medical bed 2/9.  DC home pending clinical improvement, hopefully 2/10.  Patient was admitted as observation for asthma  exacerbation in the context of acute viral bronchitis.  Although her breathing has improved, she still has significant clinical bronchospasm that requires inpatient treatment with steroids, nebs and close monitoring.  Thereby changed to inpatient status.   Consultants:  None  Procedures:  None  Antimicrobials:  Tamiflu 2/9   Subjective: Patient reports that she stays with her 3 young kids, oldest is 32 years old.  No sickly contacts.  Indicates that her asthma is well controlled and she does not use any maintenance medications.  Uses inhaler/nebulizer infrequently.  Unable to say what triggered current episode.  Feels better.  Dyspnea improved but still has some chest tightness, dry cough and chest pain only on coughing.  Headache.  ROS: As above, otherwise negative  Objective:  Vitals:   02/08/19 0815 02/08/19 0939 02/08/19 1008 02/08/19 1101  BP:  112/84  109/72  Pulse:  69  77  Resp:  (!) 21  20  Temp: 97.6 F (36.4 C)   97.6 F (36.4 C)  TempSrc: Oral   Oral  SpO2:  100% 98% 99%  Weight:      Height:        Examination:  General exam: Pleasant young female, well built and nourished lying comfortably propped up in bed without distress. Respiratory system: Slightly harsh breath sounds bilaterally with scattered few medium pitched expiratory rhonchi.  No crackles.  Respiratory effort normal.  Able to speak in full sentences. Cardiovascular system: S1 & S2 heard, RRR. No JVD, murmurs, rubs, gallops or clicks. No pedal edema.  Telemetry personally reviewed: Mostly SB in the 50s-SR. Gastrointestinal system: Abdomen is nondistended, soft and nontender. No organomegaly or masses felt. Normal bowel sounds heard. Central nervous system: Alert and oriented. No  focal neurological deficits. Extremities: Symmetric 5 x 5 power. Skin: No rashes, lesions or ulcers Psychiatry: Judgement and insight appear normal. Mood & affect appropriate.     Data Reviewed: I have personally  reviewed following labs and imaging studies  CBC: Recent Labs  Lab 02/07/19 1003  WBC 3.9*  NEUTROABS 2.1  HGB 11.9*  HCT 39.4  MCV 85.3  PLT 220   Basic Metabolic Panel: Recent Labs  Lab 02/07/19 1003 02/08/19 0337  NA 135 139  K 3.9 4.8  CL 105 113*  CO2 19* 21*  GLUCOSE 122* 134*  BUN 7 11  CREATININE 0.97 0.73  CALCIUM 8.8* 8.5*   Liver Function Tests: Recent Labs  Lab 02/08/19 0337  AST 21  ALT 21  ALKPHOS 51  BILITOT 0.4  PROT 7.3  ALBUMIN 3.5     Recent Results (from the past 240 hour(s))  Respiratory Panel by PCR     Status: Abnormal   Collection Time: 02/07/19 12:59 PM  Result Value Ref Range Status   Adenovirus NOT DETECTED NOT DETECTED Final   Coronavirus 229E NOT DETECTED NOT DETECTED Final    Comment: (NOTE) The Coronavirus on the Respiratory Panel, DOES NOT test for the novel  Coronavirus (2019 nCoV)    Coronavirus HKU1 NOT DETECTED NOT DETECTED Final   Coronavirus NL63 NOT DETECTED NOT DETECTED Final   Coronavirus OC43 NOT DETECTED NOT DETECTED Final   Metapneumovirus NOT DETECTED NOT DETECTED Final   Rhinovirus / Enterovirus NOT DETECTED NOT DETECTED Final   Influenza A H1 2009 DETECTED (A) NOT DETECTED Final   Influenza B NOT DETECTED NOT DETECTED Final   Parainfluenza Virus 1 NOT DETECTED NOT DETECTED Final   Parainfluenza Virus 2 NOT DETECTED NOT DETECTED Final   Parainfluenza Virus 3 NOT DETECTED NOT DETECTED Final   Parainfluenza Virus 4 NOT DETECTED NOT DETECTED Final   Respiratory Syncytial Virus NOT DETECTED NOT DETECTED Final   Bordetella pertussis NOT DETECTED NOT DETECTED Final   Chlamydophila pneumoniae NOT DETECTED NOT DETECTED Final   Mycoplasma pneumoniae NOT DETECTED NOT DETECTED Final  MRSA PCR Screening     Status: None   Collection Time: 02/07/19  5:19 PM  Result Value Ref Range Status   MRSA by PCR NEGATIVE NEGATIVE Final    Comment:        The GeneXpert MRSA Assay (FDA approved for NASAL specimens only), is  one component of a comprehensive MRSA colonization surveillance program. It is not intended to diagnose MRSA infection nor to guide or monitor treatment for MRSA infections. Performed at [facility], [address] [place], [place] [address]          Radiology Studies: Dg Chest 2 View  Result Date: 02/07/2019 CLINICAL DATA:  Shortness of breath with cough and congestion EXAM: CHEST - 2 VIEW COMPARISON:  September 22, 2015 FINDINGS: There is no edema or consolidation. The heart size and pulmonary vascularity are normal. No adenopathy. No bone lesions. IMPRESSION: No edema or consolidation. Electronically Signed   By: Bretta Bang III M.D.   On: 02/07/2019 10:14        Scheduled Meds: . benzonatate  200 mg Oral TID  . budesonide (PULMICORT) nebulizer solution  0.25 mg Nebulization BID  . enoxaparin (LOVENOX) injection  40 mg Subcutaneous Q24H  . ipratropium-albuterol  3 mL Nebulization Q6H  . methylPREDNISolone (SOLU-MEDROL) injection  60 mg Intravenous Q12H  . oseltamivir  75 mg Oral BID   Continuous Infusions: . sodium chloride  50 mL/hr at 02/08/19 0938     LOS: 0 days     Marcellus Scott, MD, FACP, [facility]. [facility]  To contact the attending provider between 7A-7P or the covering provider during after hours 7P-7A, please log into the web site [email or web address] and access using universal Jette password for that web site. If you do not have the password, please call the hospital operator.  02/08/2019, 12:44 PM

## 2019-02-09 MED ORDER — GUAIFENESIN ER 600 MG PO TB12
1200.0000 mg | ORAL_TABLET | Freq: Two times a day (BID) | ORAL | Status: DC
  Administered 2019-02-09 – 2019-02-10 (×3): 1200 mg via ORAL
  Filled 2019-02-09 (×3): qty 2

## 2019-02-09 MED ORDER — IPRATROPIUM-ALBUTEROL 0.5-2.5 (3) MG/3ML IN SOLN
3.0000 mL | Freq: Three times a day (TID) | RESPIRATORY_TRACT | Status: DC
  Administered 2019-02-10 – 2019-02-12 (×7): 3 mL via RESPIRATORY_TRACT
  Filled 2019-02-09 (×7): qty 3

## 2019-02-09 NOTE — Progress Notes (Signed)
Patient walked to bathroom, complained about short of breath. SpO2= 88% on room air. Let patient sat down and take deep breaths; spO2= 94-100% on room air.

## 2019-02-09 NOTE — Progress Notes (Signed)
PROGRESS NOTE   Melissa [name]  ESP:[id number]    DOB: 1986-05-23    DOA: 02/07/2019  PCP: Dorothyann Peng, MD   I have briefly reviewed patients previous medical records in [facility].  Brief Narrative:  33 year old female with PMH of mild persistent asthma, iron deficiency anemia, reported MI and CVA, HTN, presented to ED with 2 days history of dry cough, progressively worsening dyspnea and wheezing that failed home nebulizer treatment.  Admitted for acute asthma exacerbation in the context of acute viral/influenza A bronchitis.  Slowly improving but still quite symptomatic of dyspnea and hypoxia with minimal activity.   Assessment & Plan:   Principal Problem:   Asthma exacerbation Active Problems:   Severe persistent asthma with allergic rhinitis with status asthmaticus   Acute hypoxemic respiratory failure (HCC)   1. Influenza A acute bronchitis: Patient was placed on droplet isolation.  RSV panel came back positive for influenza A.  Started Tamiflu 2/9 and complete 5 days course.  Continue symptomatic/supportive treatment.  Slowly improving.  No fevers, cough present but better. 2. Acute asthma exacerbation: Precipitated by influenza.  In ED, treated with Solu-Medrol, magnesium, hour-long nebulization but remained severely dyspneic with wheezing.  Admitted to stepdown unit overnight.  Continued IV Solu-Medrol 60 mg every 12, Pulmicort nebulizations, bronchodilator nebulizations. Reportedly follows with Dr. Sherene Sires, Pulmonology.  Slowly improving but still quite symptomatic of dyspnea and hypoxia with minimal activity.  Continue current management for additional 24 hours. 3. Acute respiratory failure with hypoxia: Secondary to above.  Oxygen saturation dropped to 88% on room air with activity.  Reassess home oxygen requirement at discharge. 4. Iron deficiency anemia: Stable.  Outpatient follow-up. 5. Essential hypertension: Controlled off of medications at home.  Monitor.   DVT  prophylaxis: Lovenox Code Status: Full Family Communication: None at bedside Disposition: DC home pending clinical improvement, hopefully in the next 1 to 2 days.  Consultants:  None  Procedures:  None  Antimicrobials:  Tamiflu 2/9   Subjective: Cough has improved but still has a lot.  Nonproductive.  Chest wall sore and painful with coughing.  Ambulated with nursing and became dyspneic, weakness and hypoxic.  ROS: As above, otherwise negative  Objective:  Vitals:   02/09/19 0823 02/09/19 1043 02/09/19 1310 02/09/19 1354  BP:  131/84  119/72  Pulse:  (!) 103  81  Resp:    17  Temp:    98.2 F (36.8 C)  TempSrc:    Oral  SpO2: 98% (!) 88% 100% 100%  Weight:      Height:        Examination:  General exam: Pleasant young female, well built and nourished sitting up in bed with intermittent dry hacking cough. Respiratory system: Improving breath sounds.  Still slightly harsh bilaterally.  Few scattered bilateral expiratory rhonchi but no crackles.  No increased work of breathing. Cardiovascular system: S1 & S2 heard, RRR. No JVD, murmurs, rubs, gallops or clicks. No pedal edema.  Stable Gastrointestinal system: Abdomen is nondistended, soft and nontender. No organomegaly or masses felt. Normal bowel sounds heard.  Stable Central nervous system: Alert and oriented. No focal neurological deficits. Extremities: Symmetric 5 x 5 power. Skin: No rashes, lesions or ulcers Psychiatry: Judgement and insight appear normal. Mood & affect appropriate.     Data Reviewed: I have personally reviewed following labs and imaging studies  CBC: Recent Labs  Lab 02/07/19 1003  WBC 3.9*  NEUTROABS 2.1  HGB 11.9*  HCT 39.4  MCV 85.3  PLT 220   Basic Metabolic Panel: Recent Labs  Lab 02/07/19 1003 02/08/19 0337  NA 135 139  K 3.9 4.8  CL 105 113*  CO2 19* 21*  GLUCOSE 122* 134*  BUN 7 11  CREATININE 0.97 0.73  CALCIUM 8.8* 8.5*   Liver Function Tests: Recent Labs  Lab  02/08/19 0337  AST 21  ALT 21  ALKPHOS 51  BILITOT 0.4  PROT 7.3  ALBUMIN 3.5     Recent Results (from the past 240 hour(s))  Respiratory Panel by PCR     Status: Abnormal   Collection Time: 02/07/19 12:59 PM  Result Value Ref Range Status   Adenovirus NOT DETECTED NOT DETECTED Final   Coronavirus 229E NOT DETECTED NOT DETECTED Final    Comment: (NOTE) The Coronavirus on the Respiratory Panel, DOES NOT test for the novel  Coronavirus (2019 nCoV)    Coronavirus HKU1 NOT DETECTED NOT DETECTED Final   Coronavirus NL63 NOT DETECTED NOT DETECTED Final   Coronavirus OC43 NOT DETECTED NOT DETECTED Final   Metapneumovirus NOT DETECTED NOT DETECTED Final   Rhinovirus / Enterovirus NOT DETECTED NOT DETECTED Final   Influenza A H1 2009 DETECTED (A) NOT DETECTED Final   Influenza B NOT DETECTED NOT DETECTED Final   Parainfluenza Virus 1 NOT DETECTED NOT DETECTED Final   Parainfluenza Virus 2 NOT DETECTED NOT DETECTED Final   Parainfluenza Virus 3 NOT DETECTED NOT DETECTED Final   Parainfluenza Virus 4 NOT DETECTED NOT DETECTED Final   Respiratory Syncytial Virus NOT DETECTED NOT DETECTED Final   Bordetella pertussis NOT DETECTED NOT DETECTED Final   Chlamydophila pneumoniae NOT DETECTED NOT DETECTED Final   Mycoplasma pneumoniae NOT DETECTED NOT DETECTED Final  MRSA PCR Screening     Status: None   Collection Time: 02/07/19  5:19 PM  Result Value Ref Range Status   MRSA by PCR NEGATIVE NEGATIVE Final    Comment:        The GeneXpert MRSA Assay (FDA approved for NASAL specimens only), is one component of a comprehensive MRSA colonization surveillance program. It is not intended to diagnose MRSA infection nor to guide or monitor treatment for MRSA infections. Performed at [facility], [address], [place], [place] [address]          Radiology Studies: No results found.      Scheduled Meds: . benzonatate  200 mg Oral TID  . budesonide  (PULMICORT) nebulizer solution  0.25 mg Nebulization BID  . enoxaparin (LOVENOX) injection  40 mg Subcutaneous Q24H  . ipratropium-albuterol  3 mL Nebulization Q6H  . methylPREDNISolone (SOLU-MEDROL) injection  60 mg Intravenous Q12H  . oseltamivir  75 mg Oral BID   Continuous Infusions:    LOS: 1 day     Marcellus Scott, MD, FACP, [facility]. [facility]  To contact the attending provider between 7A-7P or the covering provider during after hours 7P-7A, please log into the web site [email or web address] and access using universal [place] password for that web site. If you do not have the password, please call the hospital operator.  02/09/2019, 3:29 PM

## 2019-02-10 DIAGNOSIS — J4540 Moderate persistent asthma, uncomplicated: Secondary | ICD-10-CM

## 2019-02-10 LAB — CBC
HCT: 36.8 % (ref 36.0–46.0)
Hemoglobin: 10.9 g/dL — ABNORMAL LOW (ref 12.0–15.0)
MCH: 25.1 pg — AB (ref 26.0–34.0)
MCHC: 29.6 g/dL — ABNORMAL LOW (ref 30.0–36.0)
MCV: 84.8 fL (ref 80.0–100.0)
Platelets: 189 10*3/uL (ref 150–400)
RBC: 4.34 MIL/uL (ref 3.87–5.11)
RDW: 14.8 % (ref 11.5–15.5)
WBC: 9.5 10*3/uL (ref 4.0–10.5)
nRBC: 0 % (ref 0.0–0.2)

## 2019-02-10 NOTE — Progress Notes (Signed)
PROGRESS NOTE  Chelsie BRISTYL HEIDT EYE:[id number] DOB: Feb 25, 1986 DOA: 02/07/2019 PCP: Dorothyann Peng, MD   LOS: 2 days   Brief Narrative / Interim history: 33 year old female with PMH of mild persistent asthma, iron deficiency anemia, reported MI and CVA, HTN, presented to ED with 2 days history of dry cough, progressively worsening dyspnea and wheezing that failed home nebulizer treatment.  Admitted for acute asthma exacerbation in the context of acute viral/influenza A bronchitis.  Slowly improving but still quite symptomatic of dyspnea and hypoxia with minimal activity.  Subjective: -Feels well at rest, however barely able to ambulate to the bedside commode.  Try to walk out of the room towards the window, about 20 feet, and had to stop twice and nurse tech brought a chair as she felt that she was close to passing out  Assessment & Plan: Principal Problem:   Asthma exacerbation Active Problems:   Severe persistent asthma with allergic rhinitis with status asthmaticus   Acute hypoxemic respiratory failure (HCC)   Principal Problem Acute asthma exacerbation due to influenza A acute bronchitis, acute hypoxic respiratory failure -Continue supportive treatment, she was started on Tamiflu on 2/9 and will continue to complete a 5-day course.  She is slowly improving however remains significantly symptomatic -Continue steroids, nebulizers, encouraged ambulation as tolerated this afternoon.  Patient wants to go home but realizes that she is significantly weaker than her baseline  Active Problems Iron deficiency anemia -Stable, outpatient follow-up  Essential hypertension -Controlled off of her home medications.  Monitor  Scheduled Meds: . benzonatate  200 mg Oral TID  . budesonide (PULMICORT) nebulizer solution  0.25 mg Nebulization BID  . enoxaparin (LOVENOX) injection  40 mg Subcutaneous Q24H  . guaiFENesin  1,200 mg Oral BID  . ipratropium-albuterol  3 mL Nebulization TID  .  methylPREDNISolone (SOLU-MEDROL) injection  60 mg Intravenous Q12H  . oseltamivir  75 mg Oral BID   Continuous Infusions: PRN Meds:.acetaminophen, albuterol, ibuprofen  DVT prophylaxis: Lovenox Code Status: Full code Family Communication: no family at bedside  Disposition Plan: Home pending clinical improvement  Consultants:   None  Procedures:   None   Antimicrobials:  Tamiflu 2/9 >>    Objective: Vitals:   02/09/19 2201 02/10/19 0548 02/10/19 0745 02/10/19 1000  BP: 111/75 114/79    Pulse: (!) 55 (!) 57    Resp: 17 19    Temp: 98.2 F (36.8 C) (!) 97.5 F (36.4 C)  98 F (36.7 C)  TempSrc:    Oral  SpO2: 98% 98% 98%   Weight:      Height:        Intake/Output Summary (Last 24 hours) at 02/10/2019 1322 Last data filed at 02/10/2019 0946 Gross per 24 hour  Intake 1080 ml  Output -  Net 1080 ml   Filed Weights   02/07/19 1954  Weight: 85.5 kg    Examination:  Constitutional: NAD Eyes: PERRL, lids and conjunctivae normal ENMT: Mucous membranes are moist.  Respiratory: Coarse breath sounds especially at the bases, slight end expiratory wheezing.  No crackles.  Tachypneic Cardiovascular: Regular rate and rhythm, no murmurs / rubs / gallops. No LE edema. 2+ pedal pulses.  Abdomen: no tenderness. Bowel sounds positive.  Skin: no rashes Neurologic: CN 2-12 grossly intact. Strength 5/5 in all 4.  Psychiatric: Normal judgment and insight. Alert and oriented x 3. Normal mood.    Data Reviewed: I have independently reviewed following labs and imaging studies   CBC: Recent Labs  Lab 02/07/19  1003 02/10/19 0657  WBC 3.9* 9.5  NEUTROABS 2.1  --   HGB 11.9* 10.9*  HCT 39.4 36.8  MCV 85.3 84.8  PLT 220 189   Basic Metabolic Panel: Recent Labs  Lab 02/07/19 1003 02/08/19 0337  NA 135 139  K 3.9 4.8  CL 105 113*  CO2 19* 21*  GLUCOSE 122* 134*  BUN 7 11  CREATININE 0.97 0.73  CALCIUM 8.8* 8.5*   GFR: Estimated Creatinine Clearance: 112.3 mL/min  (by C-G formula based on SCr of 0.73 mg/dL). Liver Function Tests: Recent Labs  Lab 02/08/19 0337  AST 21  ALT 21  ALKPHOS 51  BILITOT 0.4  PROT 7.3  ALBUMIN 3.5   No results for input(s): LIPASE, AMYLASE in the last 168 hours. No results for input(s): AMMONIA in the last 168 hours. Coagulation Profile: No results for input(s): INR, PROTIME in the last 168 hours. Cardiac Enzymes: No results for input(s): CKTOTAL, CKMB, CKMBINDEX, TROPONINI in the last 168 hours. BNP (last 3 results) No results for input(s): PROBNP in the last 8760 hours. HbA1C: No results for input(s): HGBA1C in the last 72 hours. CBG: No results for input(s): GLUCAP in the last 168 hours. Lipid Profile: No results for input(s): CHOL, HDL, LDLCALC, TRIG, CHOLHDL, LDLDIRECT in the last 72 hours. Thyroid Function Tests: No results for input(s): TSH, T4TOTAL, FREET4, T3FREE, THYROIDAB in the last 72 hours. Anemia Panel: No results for input(s): VITAMINB12, FOLATE, FERRITIN, TIBC, IRON, RETICCTPCT in the last 72 hours. Urine analysis:    Component Value Date/Time   COLORURINE YELLOW 07/29/2015 1400   APPEARANCEUR CLOUDY (A) 07/29/2015 1400   LABSPEC 1.028 07/29/2015 1400   PHURINE 5.5 07/29/2015 1400   GLUCOSEU NEGATIVE 07/29/2015 1400   HGBUR SMALL (A) 07/29/2015 1400   BILIRUBINUR NEGATIVE 07/29/2015 1400   BILIRUBINUR neg 02/16/2013 1528   KETONESUR NEGATIVE 07/29/2015 1400   PROTEINUR NEGATIVE 07/29/2015 1400   UROBILINOGEN 0.2 07/29/2015 1400   NITRITE NEGATIVE 07/29/2015 1400   LEUKOCYTESUR MODERATE (A) 07/29/2015 1400   Sepsis Labs: Invalid input(s): PROCALCITONIN, LACTICIDVEN  Recent Results (from the past 240 hour(s))  Respiratory Panel by PCR     Status: Abnormal   Collection Time: 02/07/19 12:59 PM  Result Value Ref Range Status   Adenovirus NOT DETECTED NOT DETECTED Final   Coronavirus 229E NOT DETECTED NOT DETECTED Final    Comment: (NOTE) The Coronavirus on the Respiratory Panel,  DOES NOT test for the novel  Coronavirus (2019 nCoV)    Coronavirus HKU1 NOT DETECTED NOT DETECTED Final   Coronavirus NL63 NOT DETECTED NOT DETECTED Final   Coronavirus OC43 NOT DETECTED NOT DETECTED Final   Metapneumovirus NOT DETECTED NOT DETECTED Final   Rhinovirus / Enterovirus NOT DETECTED NOT DETECTED Final   Influenza A H1 2009 DETECTED (A) NOT DETECTED Final   Influenza B NOT DETECTED NOT DETECTED Final   Parainfluenza Virus 1 NOT DETECTED NOT DETECTED Final   Parainfluenza Virus 2 NOT DETECTED NOT DETECTED Final   Parainfluenza Virus 3 NOT DETECTED NOT DETECTED Final   Parainfluenza Virus 4 NOT DETECTED NOT DETECTED Final   Respiratory Syncytial Virus NOT DETECTED NOT DETECTED Final   Bordetella pertussis NOT DETECTED NOT DETECTED Final   Chlamydophila pneumoniae NOT DETECTED NOT DETECTED Final   Mycoplasma pneumoniae NOT DETECTED NOT DETECTED Final  MRSA PCR Screening     Status: None   Collection Time: 02/07/19  5:19 PM  Result Value Ref Range Status   MRSA by PCR NEGATIVE NEGATIVE Final  Comment:        The GeneXpert MRSA Assay (FDA approved for NASAL specimens only), is one component of a comprehensive MRSA colonization surveillance program. It is not intended to diagnose MRSA infection nor to guide or monitor treatment for MRSA infections. Performed at [facility], [address], [place], [place] [address]       Radiology Studies: No results found.   Pamella Pert, MD, PhD [facility]  Contact via  [email or web address]  [facility] Office Info P: [phone]  F: [phone]

## 2019-02-11 DIAGNOSIS — J101 Influenza due to other identified influenza virus with other respiratory manifestations: Secondary | ICD-10-CM | POA: Diagnosis present

## 2019-02-11 DIAGNOSIS — J4541 Moderate persistent asthma with (acute) exacerbation: Secondary | ICD-10-CM

## 2019-02-11 LAB — D-DIMER, QUANTITATIVE: D-Dimer, Quant: 0.38 ug/mL-FEU (ref 0.00–0.50)

## 2019-02-11 LAB — BRAIN NATRIURETIC PEPTIDE: B Natriuretic Peptide: 16.4 pg/mL (ref 0.0–100.0)

## 2019-02-11 MED ORDER — HYDROCOD POLST-CPM POLST ER 10-8 MG/5ML PO SUER
5.0000 mL | Freq: Two times a day (BID) | ORAL | Status: DC
  Administered 2019-02-11 – 2019-02-12 (×3): 5 mL via ORAL
  Filled 2019-02-11 (×3): qty 5

## 2019-02-11 NOTE — Progress Notes (Signed)
SATURATION QUALIFICATIONS: (This note is used to comply with regulatory documentation for home oxygen)  Patient Saturations on Room Air at Rest =1000%  Patient Saturations on Room Air while Ambulating = 85%  Patient Saturations on 2 Liters of oxygen while Ambulating = 100%  Please briefly explain why patient needs home oxygen: Patient unable to maintain adequate oxygenation greater than 92% while ambulating

## 2019-02-11 NOTE — Progress Notes (Signed)
[facility]                                                                              Patient Demographics  Melissa [name], is a 33 y.o. female, DOB - 10/09/1986, ZOX:[id number]  Admit date - 02/07/2019   Admitting Physician Costin Otelia SergeantM Gherghe, MD  Outpatient Primary MD for the patient is Dorothyann PengSanders, Robyn, MD  Outpatient specialists:   LOS - 3  days   Medical records reviewed and are as summarized below:    Chief Complaint  Patient presents with  . Shortness of Breath  . Cough  . Nasal Congestion       Brief summary   33 year old female with PMH of mild persistent asthma, iron deficiency anemia, reported MI and CVA, HTN, presented to ED with 2 days history of dry cough, progressively worsening dyspnea and wheezing that failed home nebulizer treatment. Admitted for acute asthma exacerbation in the context of acute viral/influenza A bronchitis. Slowly improving but still quite symptomatic of dyspnea and hypoxia with minimal activity.    Assessment & Plan    Principal Problem:   Acute hypoxemic respiratory failure (HCC) secondary to acute asthma exacerbation and influenza A -Still quite hypoxic, on ambulating, O2 sats down to 85% and short of breath -Continue scheduled nebs, Pulmicort, IV Solu-Medrol, Tamiflu  Active Problems:   Severe persistent asthma with allergic rhinitis with status asthmaticus -Still wheezing, continue IV Solu-Medrol, scheduled nebs, Pulmicort -Closer to discharge, will transition to oral prednisone, start on Breo, Singulair.  Previously was on Dulera -Becton, Dickinson and CompanyContinue Tessalon Perles, Hycodan cough syrup -She has had severe persistent asthma, will do prednisone slow taper over 2 weeks at the time of discharge.  She will need close follow-up with Dr. Sherene SiresWert.  -will check BNP and d-dimer  Hypertension -Patient reports that she is not taking Norvasc -BP is currently stable  Iron deficiency anemia H&H stable, not taking her iron  supplements  Code Status: Full CODE STATUS DVT Prophylaxis:  Lovenox  Family Communication: Discussed in detail with the patient, all imaging results, lab results explained to the patient    Disposition Plan: DC in 1 to 2 days if hypoxia resolved  Time Spent in minutes 25 minutes  Procedures:  None  Consultants:   None  Antimicrobials:   Anti-infectives (From admission, onward)   Start     Dose/Rate Route Frequency Ordered Stop   02/08/19 1000  oseltamivir (TAMIFLU) capsule 75 mg     75 mg Oral 2 times daily 02/08/19 0934 02/13/19 0959         Medications  Scheduled Meds: . benzonatate  200 mg Oral TID  . budesonide (PULMICORT) nebulizer solution  0.25 mg Nebulization BID  . chlorpheniramine-HYDROcodone  5 mL Oral Q12H  . enoxaparin (LOVENOX) injection  40 mg Subcutaneous Q24H  . ipratropium-albuterol  3 mL Nebulization TID  . methylPREDNISolone (SOLU-MEDROL) injection  60 mg Intravenous Q12H  . oseltamivir  75 mg Oral BID   Continuous Infusions: PRN Meds:.acetaminophen, albuterol, ibuprofen      Subjective:   Melissa [name] was seen and examined today.  Still short of breath and wheezing, coughing.  No  fevers or chills. Patient denies dizziness, abdominal pain, N/V/D/C, new weakness, numbess, tingling. No acute events overnight.    Objective:   Vitals:   02/11/19 0519 02/11/19 0830 02/11/19 1357 02/11/19 1412  BP: 135/88   133/75  Pulse: 61   84  Resp: 20   18  Temp: 98.2 F (36.8 C)   98.1 F (36.7 C)  TempSrc: Oral     SpO2: 100% 99% 99% 100%  Weight:      Height:        Intake/Output Summary (Last 24 hours) at 02/11/2019 1456 Last data filed at 02/11/2019 7741 Gross per 24 hour  Intake 240 ml  Output -  Net 240 ml     Wt Readings from Last 3 Encounters:  02/07/19 85.5 kg  09/22/15 93.9 kg  09/08/15 93.9 kg     Exam  General: Alert and oriented x 3, NAD  Eyes:   HEENT:  Atraumatic, normocephalic  Cardiovascular: S1 S2  auscultated,Regular rate and rhythm.  Respiratory: Bilateral expiratory wheezing  Gastrointestinal: Soft, nontender, nondistended, + bowel sounds  Ext: no pedal edema bilaterally  Neuro: No new deficits  Musculoskeletal: No digital cyanosis, clubbing  Skin: No rashes  Psych: Normal affect and demeanor, alert and oriented x3    Data Reviewed:  I have personally reviewed following labs and imaging studies  Micro Results Recent Results (from the past 240 hour(s))  Respiratory Panel by PCR     Status: Abnormal   Collection Time: 02/07/19 12:59 PM  Result Value Ref Range Status   Adenovirus NOT DETECTED NOT DETECTED Final   Coronavirus 229E NOT DETECTED NOT DETECTED Final    Comment: (NOTE) The Coronavirus on the Respiratory Panel, DOES NOT test for the novel  Coronavirus (2019 nCoV)    Coronavirus HKU1 NOT DETECTED NOT DETECTED Final   Coronavirus NL63 NOT DETECTED NOT DETECTED Final   Coronavirus OC43 NOT DETECTED NOT DETECTED Final   Metapneumovirus NOT DETECTED NOT DETECTED Final   Rhinovirus / Enterovirus NOT DETECTED NOT DETECTED Final   Influenza A H1 2009 DETECTED (A) NOT DETECTED Final   Influenza B NOT DETECTED NOT DETECTED Final   Parainfluenza Virus 1 NOT DETECTED NOT DETECTED Final   Parainfluenza Virus 2 NOT DETECTED NOT DETECTED Final   Parainfluenza Virus 3 NOT DETECTED NOT DETECTED Final   Parainfluenza Virus 4 NOT DETECTED NOT DETECTED Final   Respiratory Syncytial Virus NOT DETECTED NOT DETECTED Final   Bordetella pertussis NOT DETECTED NOT DETECTED Final   Chlamydophila pneumoniae NOT DETECTED NOT DETECTED Final   Mycoplasma pneumoniae NOT DETECTED NOT DETECTED Final  MRSA PCR Screening     Status: None   Collection Time: 02/07/19  5:19 PM  Result Value Ref Range Status   MRSA by PCR NEGATIVE NEGATIVE Final    Comment:        The GeneXpert MRSA Assay (FDA approved for NASAL specimens only), is one component of a comprehensive MRSA  colonization surveillance program. It is not intended to diagnose MRSA infection nor to guide or monitor treatment for MRSA infections. Performed at [facility], [address] [place], [place] [address]     Radiology Reports Dg Chest 2 View  Result Date: 02/07/2019 CLINICAL DATA:  Shortness of breath with cough and congestion EXAM: CHEST - 2 VIEW COMPARISON:  September 22, 2015 FINDINGS: There is no edema or consolidation. The heart size and pulmonary vascularity are normal. No adenopathy. No bone lesions. IMPRESSION: No edema or consolidation. Electronically  Signed   By: Bretta Bang III M.D.   On: 02/07/2019 10:14    Lab Data:  CBC: Recent Labs  Lab 02/07/19 1003 02/10/19 0657  WBC 3.9* 9.5  NEUTROABS 2.1  --   HGB 11.9* 10.9*  HCT 39.4 36.8  MCV 85.3 84.8  PLT 220 189   Basic Metabolic Panel: Recent Labs  Lab 02/07/19 1003 02/08/19 0337  NA 135 139  K 3.9 4.8  CL 105 113*  CO2 19* 21*  GLUCOSE 122* 134*  BUN 7 11  CREATININE 0.97 0.73  CALCIUM 8.8* 8.5*   GFR: Estimated Creatinine Clearance: 112.3 mL/min (by C-G formula based on SCr of 0.73 mg/dL). Liver Function Tests: Recent Labs  Lab 02/08/19 0337  AST 21  ALT 21  ALKPHOS 51  BILITOT 0.4  PROT 7.3  ALBUMIN 3.5   No results for input(s): LIPASE, AMYLASE in the last 168 hours. No results for input(s): AMMONIA in the last 168 hours. Coagulation Profile: No results for input(s): INR, PROTIME in the last 168 hours. Cardiac Enzymes: No results for input(s): CKTOTAL, CKMB, CKMBINDEX, TROPONINI in the last 168 hours. BNP (last 3 results) No results for input(s): PROBNP in the last 8760 hours. HbA1C: No results for input(s): HGBA1C in the last 72 hours. CBG: No results for input(s): GLUCAP in the last 168 hours. Lipid Profile: No results for input(s): CHOL, HDL, LDLCALC, TRIG, CHOLHDL, LDLDIRECT in the last 72 hours. Thyroid Function Tests: No results for input(s): TSH,  T4TOTAL, FREET4, T3FREE, THYROIDAB in the last 72 hours. Anemia Panel: No results for input(s): VITAMINB12, FOLATE, FERRITIN, TIBC, IRON, RETICCTPCT in the last 72 hours. Urine analysis:    Component Value Date/Time   COLORURINE YELLOW 07/29/2015 1400   APPEARANCEUR CLOUDY (A) 07/29/2015 1400   LABSPEC 1.028 07/29/2015 1400   PHURINE 5.5 07/29/2015 1400   GLUCOSEU NEGATIVE 07/29/2015 1400   HGBUR SMALL (A) 07/29/2015 1400   BILIRUBINUR NEGATIVE 07/29/2015 1400   BILIRUBINUR neg 02/16/2013 1528   KETONESUR NEGATIVE 07/29/2015 1400   PROTEINUR NEGATIVE 07/29/2015 1400   UROBILINOGEN 0.2 07/29/2015 1400   NITRITE NEGATIVE 07/29/2015 1400   LEUKOCYTESUR MODERATE (A) 07/29/2015 1400     Ripudeep Rai M.D. [facility] 02/11/2019, 2:56 PM  Pager: [phone] Between 7am to 7pm - call Pager - [phone]  After 7pm go to [email or web address] - password [email or web address]  Call night coverage person covering after 7pm

## 2019-02-12 LAB — CBC
HCT: 38.9 % (ref 36.0–46.0)
Hemoglobin: 11.6 g/dL — ABNORMAL LOW (ref 12.0–15.0)
MCH: 25.7 pg — ABNORMAL LOW (ref 26.0–34.0)
MCHC: 29.8 g/dL — ABNORMAL LOW (ref 30.0–36.0)
MCV: 86.1 fL (ref 80.0–100.0)
NRBC: 0 % (ref 0.0–0.2)
PLATELETS: 220 10*3/uL (ref 150–400)
RBC: 4.52 MIL/uL (ref 3.87–5.11)
RDW: 14.6 % (ref 11.5–15.5)
WBC: 13.7 10*3/uL — ABNORMAL HIGH (ref 4.0–10.5)

## 2019-02-12 LAB — BASIC METABOLIC PANEL
ANION GAP: 6 (ref 5–15)
BUN: 15 mg/dL (ref 6–20)
CO2: 26 mmol/L (ref 22–32)
Calcium: 8.9 mg/dL (ref 8.9–10.3)
Chloride: 106 mmol/L (ref 98–111)
Creatinine, Ser: 0.68 mg/dL (ref 0.44–1.00)
GFR calc Af Amer: >60 mL/min (ref >60)
GFR calc non Af Amer: >60 mL/min (ref >60)
Glucose, Bld: 128 mg/dL — ABNORMAL HIGH (ref 70–99)
Potassium: 4.3 mmol/L (ref 3.5–5.1)
Sodium: 138 mmol/L (ref 135–145)

## 2019-02-12 LAB — MAGNESIUM: Magnesium: 2.1 mg/dL (ref 1.7–2.4)

## 2019-02-12 MED ORDER — MONTELUKAST SODIUM 10 MG PO TABS: 10.0000 mg | ORAL_TABLET | Freq: Every day | ORAL | Status: DC

## 2019-02-12 MED ORDER — OSELTAMIVIR PHOSPHATE 75 MG PO CAPS: 75.0000 mg | ORAL_CAPSULE | Freq: Two times a day (BID) | ORAL | 0 refills | Status: AC

## 2019-02-12 MED ORDER — BENZONATATE 200 MG PO CAPS: 200.0000 mg | ORAL_CAPSULE | Freq: Three times a day (TID) | ORAL | 0 refills | Status: DC | PRN

## 2019-02-12 MED ORDER — PREDNISONE 10 MG PO TABS: ORAL_TABLET | ORAL | 0 refills | Status: DC

## 2019-02-12 MED ORDER — HYDROCOD POLST-CPM POLST ER 10-8 MG/5ML PO SUER: 5.0000 mL | Freq: Two times a day (BID) | ORAL | 0 refills | Status: DC | PRN

## 2019-02-12 MED ORDER — MOMETASONE FURO-FORMOTEROL FUM 100-5 MCG/ACT IN AERO
2.0000 | INHALATION_SPRAY | Freq: Two times a day (BID) | RESPIRATORY_TRACT | Status: DC
  Filled 2019-02-12: qty 8.8

## 2019-02-12 MED ORDER — MONTELUKAST SODIUM 10 MG PO TABS: 10.0000 mg | ORAL_TABLET | Freq: Every day | ORAL | 3 refills | Status: DC

## 2019-02-12 MED ORDER — PREDNISONE 20 MG PO TABS: 40.0000 mg | ORAL_TABLET | Freq: Every day | ORAL | Status: DC

## 2019-02-12 MED ORDER — MOMETASONE FURO-FORMOTEROL FUM 100-5 MCG/ACT IN AERO: 2.0000 | INHALATION_SPRAY | Freq: Two times a day (BID) | RESPIRATORY_TRACT | 3 refills | Status: DC

## 2019-02-12 NOTE — Care Management Note (Signed)
Case Management Note  Patient Details  Name: Melissa [name] MRN: [id number] Date of Birth: [birth year]  Subjective/Objective:                  discharged  Action/Plan: Discharged to home with self-care, orders checked for hhc needs. No CM needs present at time of discharge.  Patient is able to arrangement own appointments and home care.  Expected Discharge Date:  02/12/19               Expected Discharge Plan:  Home/Self Care  In-House Referral:  NA  Discharge planning Services  CM Consult  Post Acute Care Choice:  NA Choice offered to:  NA  DME Arranged:  N/A DME Agency:     HH Arranged:  NA HH Agency:     Status of Service:  Completed, signed off  If discussed at Long Length of Stay Meetings, dates discussed:    Additional Comments:  Golda Acre, RN 02/12/2019, 11:04 AM

## 2019-02-12 NOTE — Discharge Summary (Signed)
Physician Discharge Summary   Patient ID: Melissa [name] MRN: [id number] DOB/AGE: May 21, 1986 33 y.o.  Admit date: 02/07/2019 Discharge date: 02/12/2019  Primary Care Physician:  Dorothyann Peng, MD   Recommendations for Outpatient Follow-up:  1. Follow up with PCP in 1-2 weeks 2. Recommended to follow-up with her pulmonologist, Dr. Sherene Sires  Home Health: None  Equipment/Devices:   Discharge Condition: stable  CODE STATUS: FULL Diet recommendation: Heart healthy diet   Discharge Diagnoses:    . Asthma exacerbation . Severe persistent asthma with allergic rhinitis with status asthmaticus . Influenza A Acute respiratory failure with hypoxia Hypertension Iron deficiency anemia  Consults: None    Allergies:   Allergies  Allergen Reactions  . Ginger Anaphylaxis     DISCHARGE MEDICATIONS: Allergies as of 02/12/2019      Reactions   Ginger Anaphylaxis      Medication List    TAKE these medications   albuterol 108 (90 Base) MCG/ACT inhaler Commonly known as:  PROVENTIL HFA;VENTOLIN HFA Inhale 2 puffs into the lungs every 4 (four) hours as needed for wheezing or shortness of breath.   albuterol (2.5 MG/3ML) 0.083% nebulizer solution Commonly known as:  PROVENTIL Take 2.5 mg by nebulization every 4 (four) hours as needed for wheezing or shortness of breath.   benzonatate 200 MG capsule Commonly known as:  TESSALON Take 1 capsule (200 mg total) by mouth 3 (three) times daily as needed for cough.   chlorpheniramine-HYDROcodone 10-8 MG/5ML Suer Commonly known as:  TUSSIONEX Take 5 mLs by mouth every 12 (twelve) hours as needed for cough.   ibuprofen 600 MG tablet Commonly known as:  ADVIL,MOTRIN Take 600 mg by mouth every 6 (six) hours as needed.   mometasone-formoterol 100-5 MCG/ACT Aero Commonly known as:  DULERA Inhale 2 puffs into the lungs 2 (two) times daily.   montelukast 10 MG tablet Commonly known as:  SINGULAIR Take 1 tablet (10 mg total) by mouth at  bedtime.   NEXPLANON 68 MG Impl implant Generic drug:  etonogestrel 1 each by Subdermal route once.   oseltamivir 75 MG capsule Commonly known as:  TAMIFLU Take 1 capsule (75 mg total) by mouth 2 (two) times daily for 1 day. 1 more day   predniSONE 10 MG tablet Commonly known as:  DELTASONE Prednisone dosing: Take  Prednisone 40mg  (4 tabs) x 3 days, then taper to 30mg  (3 tabs) x 3 days, then 20mg  (2 tabs) x 3days, then 10mg  (1 tab) x 3days, then OFF. Start taking on:  February 13, 2019        Brief H and P: For complete details please refer to admission H and P, but in brief 33 year old female with PMH of mild persistent asthma, iron deficiency anemia, reported MI and CVA, HTN, presented to ED with 2 days history of dry cough, progressively worsening dyspnea and wheezing that failed home nebulizer treatment. Admitted for acute asthma exacerbation in the context of acute viral/influenza A bronchitis. Slowly improving but still quite symptomatic of dyspnea and hypoxia with minimal activity.  Hospital Course:   Acute hypoxemic respiratory failure (HCC) secondary to acute asthma exacerbation and influenza A -Much improved, wheezing improved, weaned off of O2.   -Initially placed on scheduled nebs, Pulmicort, IV Solu-Medrol, Tamiflu -Transition to oral prednisone with slow taper, Tamiflu for 1 more day.      Severe persistent asthma with allergic rhinitis with status asthmaticus -Initially was placed on IV Solu-Medrol, scheduled nebs, Pulmicort - Previously was on Dulera and Singulair, which  she had stopped on her own. -Continue Tessalon Perles, Hycodan cough syrup -She has had severe persistent asthma, placed on slow taper prednisone, started on Dulera, Singulair.  She will need close follow-up with Dr. Sherene Sires.  -BNP and d-dimer normal  Hypertension -Patient reports that she is not taking Norvasc -BP is currently stable  Iron deficiency anemia H&H stable, not taking her iron  supplements  Day of Discharge S: feels a lot better today, wheezing is improved, afebrile  BP 115/73 (BP Location: Right Arm)   Pulse 76   Temp 98.2 F (36.8 C) (Oral)   Resp 16   Ht 5\' 6"  (1.676 m)   Wt 85.5 kg   SpO2 97%   BMI 30.42 kg/m   Physical Exam: General: Alert and awake oriented x3 not in any acute distress. HEENT: anicteric sclera, pupils reactive to light and accommodation CVS: S1-S2 clear no murmur rubs or gallops Chest: clear to auscultation bilaterally, no wheezing rales or rhonchi Abdomen: soft nontender, nondistended, normal bowel sounds Extremities: no cyanosis, clubbing or edema noted bilaterally Neuro: Cranial nerves II-XII intact, no focal neurological deficits   The results of significant diagnostics from this hospitalization (including imaging, microbiology, ancillary and laboratory) are listed below for reference.      Procedures/Studies:  Dg Chest 2 View  Result Date: 02/07/2019 CLINICAL DATA:  Shortness of breath with cough and congestion EXAM: CHEST - 2 VIEW COMPARISON:  September 22, 2015 FINDINGS: There is no edema or consolidation. The heart size and pulmonary vascularity are normal. No adenopathy. No bone lesions. IMPRESSION: No edema or consolidation. Electronically Signed   By: Bretta Bang III M.D.   On: 02/07/2019 10:14       LAB RESULTS: Basic Metabolic Panel: Recent Labs  Lab 02/08/19 0337 02/12/19 0602  NA 139 138  K 4.8 4.3  CL 113* 106  CO2 21* 26  GLUCOSE 134* 128*  BUN 11 15  CREATININE 0.73 0.68  CALCIUM 8.5* 8.9  MG  --  2.1   Liver Function Tests: Recent Labs  Lab 02/08/19 0337  AST 21  ALT 21  ALKPHOS 51  BILITOT 0.4  PROT 7.3  ALBUMIN 3.5   No results for input(s): LIPASE, AMYLASE in the last 168 hours. No results for input(s): AMMONIA in the last 168 hours. CBC: Recent Labs  Lab 02/07/19 1003 02/10/19 0657 02/12/19 0602  WBC 3.9* 9.5 13.7*  NEUTROABS 2.1  --   --   HGB 11.9* 10.9* 11.6*   HCT 39.4 36.8 38.9  MCV 85.3 84.8 86.1  PLT 220 189 220   Cardiac Enzymes: No results for input(s): CKTOTAL, CKMB, CKMBINDEX, TROPONINI in the last 168 hours. BNP: Invalid input(s): POCBNP CBG: No results for input(s): GLUCAP in the last 168 hours.    Disposition and Follow-up: Discharge Instructions    Diet - low sodium heart healthy   Complete by:  As directed    Increase activity slowly   Complete by:  As directed        DISPOSITION: home    DISCHARGE FOLLOW-UP Follow-up Information    Dorothyann Peng, MD. Schedule an appointment as soon as possible for a visit in 2 week(s).   Specialty:  Internal Medicine Contact information: [address] [place] [address] [phone]        Nyoka Cowden, MD. Schedule an appointment as soon as possible for a visit in 2 week(s).   Specialty:  Pulmonary Disease Contact information: [address] [place] [address] [phone]            Time coordinating discharge:    Signed:   Thad Ranger M.D. [facility] 02/12/2019, 10:54 AM

## 2019-02-12 NOTE — Progress Notes (Signed)
Patient has discharged to home on 02/12/2019. Discharge instruction including medication and appointment was given to patient. Patient has no question at this time. 

## 2019-02-17 NOTE — Telephone Encounter (Signed)
Dr. Wert please advise.  Thanks! 

## 2019-02-18 ENCOUNTER — Ambulatory Visit (HOSPITAL_COMMUNITY): Admit: 2019-02-18 | Payer: BLUE CROSS/BLUE SHIELD | Admitting: Cardiovascular Disease

## 2019-02-18 ENCOUNTER — Other Ambulatory Visit: Payer: Self-pay

## 2019-02-18 ENCOUNTER — Encounter (HOSPITAL_COMMUNITY): Payer: Self-pay

## 2019-02-18 ENCOUNTER — Observation Stay (HOSPITAL_COMMUNITY)
Admission: EM | Admit: 2019-02-18 | Discharge: 2019-02-19 | Disposition: A | Discharge status: Home / Self Care | Payer: BLUE CROSS/BLUE SHIELD | Attending: Cardiology | Admitting: Cardiology

## 2019-02-18 ENCOUNTER — Inpatient Hospital Stay (HOSPITAL_COMMUNITY): Payer: BLUE CROSS/BLUE SHIELD

## 2019-02-18 ENCOUNTER — Encounter (HOSPITAL_COMMUNITY)
Admission: EM | Disposition: A | Discharge status: Home / Self Care | Payer: Self-pay | Source: Home / Self Care | Attending: Emergency Medicine

## 2019-02-18 ENCOUNTER — Emergency Department (HOSPITAL_COMMUNITY): Payer: BLUE CROSS/BLUE SHIELD

## 2019-02-18 DIAGNOSIS — Z825 Family history of asthma and other chronic lower respiratory diseases: Secondary | ICD-10-CM | POA: Insufficient documentation

## 2019-02-18 DIAGNOSIS — Z8673 Personal history of transient ischemic attack (TIA), and cerebral infarction without residual deficits: Secondary | ICD-10-CM | POA: Diagnosis not present

## 2019-02-18 DIAGNOSIS — J45901 Unspecified asthma with (acute) exacerbation: Secondary | ICD-10-CM | POA: Diagnosis not present

## 2019-02-18 DIAGNOSIS — J4552 Severe persistent asthma with status asthmaticus: Secondary | ICD-10-CM | POA: Diagnosis not present

## 2019-02-18 DIAGNOSIS — I213 ST elevation (STEMI) myocardial infarction of unspecified site: Principal | ICD-10-CM | POA: Insufficient documentation

## 2019-02-18 DIAGNOSIS — Z832 Family history of diseases of the blood and blood-forming organs and certain disorders involving the immune mechanism: Secondary | ICD-10-CM | POA: Diagnosis not present

## 2019-02-18 DIAGNOSIS — Z8249 Family history of ischemic heart disease and other diseases of the circulatory system: Secondary | ICD-10-CM | POA: Diagnosis not present

## 2019-02-18 DIAGNOSIS — R079 Chest pain, unspecified: Secondary | ICD-10-CM | POA: Diagnosis present

## 2019-02-18 DIAGNOSIS — R9431 Abnormal electrocardiogram [ECG] [EKG]: Secondary | ICD-10-CM | POA: Diagnosis not present

## 2019-02-18 DIAGNOSIS — I309 Acute pericarditis, unspecified: Secondary | ICD-10-CM | POA: Clinically undetermined

## 2019-02-18 DIAGNOSIS — D571 Sickle-cell disease without crisis: Secondary | ICD-10-CM | POA: Diagnosis not present

## 2019-02-18 HISTORY — PX: LEFT HEART CATH AND CORONARY ANGIOGRAPHY: CATH118249

## 2019-02-18 LAB — POCT I-STAT 7, (LYTES, BLD GAS, ICA,H+H)
Acid-base deficit: 1 mmol/L (ref 0.0–2.0)
Bicarbonate: 21.5 mmol/L (ref 20.0–28.0)
Calcium, Ion: 1.15 mmol/L (ref 1.15–1.40)
HCT: 33 % — ABNORMAL LOW (ref 36.0–46.0)
Hemoglobin: 11.2 g/dL — ABNORMAL LOW (ref 12.0–15.0)
O2 Saturation: 99 %
Potassium: 3.9 mmol/L (ref 3.5–5.1)
Sodium: 142 mmol/L (ref 135–145)
TCO2: 22 mmol/L (ref 22–32)
pCO2 arterial: 28.4 mmHg — ABNORMAL LOW (ref 32.0–48.0)
pH, Arterial: 7.487 — ABNORMAL HIGH (ref 7.350–7.450)
pO2, Arterial: 130 mmHg — ABNORMAL HIGH (ref 83.0–108.0)

## 2019-02-18 LAB — COMPREHENSIVE METABOLIC PANEL
ALT: 67 U/L — ABNORMAL HIGH (ref 0–44)
AST: 36 U/L (ref 15–41)
Albumin: 4.1 g/dL (ref 3.5–5.0)
Alkaline Phosphatase: 68 U/L (ref 38–126)
Anion gap: 11 (ref 5–15)
BUN: 14 mg/dL (ref 6–20)
CO2: 21 mmol/L — ABNORMAL LOW (ref 22–32)
Calcium: 9.4 mg/dL (ref 8.9–10.3)
Chloride: 105 mmol/L (ref 98–111)
Creatinine, Ser: 0.78 mg/dL (ref 0.44–1.00)
GFR calc Af Amer: >60 mL/min (ref >60)
GFR calc non Af Amer: >60 mL/min (ref >60)
Glucose, Bld: 142 mg/dL — ABNORMAL HIGH (ref 70–99)
POTASSIUM: 4.2 mmol/L (ref 3.5–5.1)
Sodium: 137 mmol/L (ref 135–145)
Total Bilirubin: 0.8 mg/dL (ref 0.3–1.2)
Total Protein: 7.9 g/dL (ref 6.5–8.1)

## 2019-02-18 LAB — I-STAT BETA HCG BLOOD, ED (MC, WL, AP ONLY): I-stat hCG, quantitative: <5 m[IU]/mL (ref <5)

## 2019-02-18 LAB — LIPID PANEL
Cholesterol: 173 mg/dL (ref 0–200)
HDL: 73 mg/dL (ref >40)
LDL Cholesterol: 93 mg/dL (ref 0–99)
Total CHOL/HDL Ratio: 2.4 RATIO
Triglycerides: 33 mg/dL (ref <150)
VLDL: 7 mg/dL (ref 0–40)

## 2019-02-18 LAB — CBC
HCT: 33.4 % — ABNORMAL LOW (ref 36.0–46.0)
HEMOGLOBIN: 10.5 g/dL — AB (ref 12.0–15.0)
MCH: 25.6 pg — ABNORMAL LOW (ref 26.0–34.0)
MCHC: 31.4 g/dL (ref 30.0–36.0)
MCV: 81.5 fL (ref 80.0–100.0)
Platelets: 269 10*3/uL (ref 150–400)
RBC: 4.1 MIL/uL (ref 3.87–5.11)
RDW: 14.6 % (ref 11.5–15.5)
WBC: 13.5 10*3/uL — ABNORMAL HIGH (ref 4.0–10.5)
nRBC: 0 % (ref 0.0–0.2)

## 2019-02-18 LAB — CBC WITH DIFFERENTIAL/PLATELET
Abs Immature Granulocytes: 0.35 10*3/uL — ABNORMAL HIGH (ref 0.00–0.07)
BASOS PCT: 0 %
Basophils Absolute: 0 10*3/uL (ref 0.0–0.1)
Eosinophils Absolute: 0 10*3/uL (ref 0.0–0.5)
Eosinophils Relative: 0 %
HCT: 40.8 % (ref 36.0–46.0)
Hemoglobin: 12.1 g/dL (ref 12.0–15.0)
Immature Granulocytes: 2 %
Lymphocytes Relative: 12 %
Lymphs Abs: 1.8 10*3/uL (ref 0.7–4.0)
MCH: 24.8 pg — ABNORMAL LOW (ref 26.0–34.0)
MCHC: 29.7 g/dL — ABNORMAL LOW (ref 30.0–36.0)
MCV: 83.8 fL (ref 80.0–100.0)
Monocytes Absolute: 0.4 10*3/uL (ref 0.1–1.0)
Monocytes Relative: 3 %
Neutro Abs: 12 10*3/uL — ABNORMAL HIGH (ref 1.7–7.7)
Neutrophils Relative %: 83 %
Platelets: 335 10*3/uL (ref 150–400)
RBC: 4.87 MIL/uL (ref 3.87–5.11)
RDW: 14.6 % (ref 11.5–15.5)
WBC: 14.6 10*3/uL — ABNORMAL HIGH (ref 4.0–10.5)
nRBC: 0 % (ref 0.0–0.2)

## 2019-02-18 LAB — APTT: aPTT: 24 seconds (ref 24–36)

## 2019-02-18 LAB — C-REACTIVE PROTEIN: CRP: <0.8 mg/dL (ref <1.0)

## 2019-02-18 LAB — CREATININE, SERUM
Creatinine, Ser: 0.73 mg/dL (ref 0.44–1.00)
GFR calc Af Amer: >60 mL/min (ref >60)
GFR calc non Af Amer: >60 mL/min (ref >60)

## 2019-02-18 LAB — TROPONIN I
Troponin I: <0.03 ng/mL (ref <0.03)
Troponin I: <0.03 ng/mL (ref <0.03)

## 2019-02-18 LAB — POCT I-STAT CREATININE: Creatinine, Ser: 0.5 mg/dL (ref 0.44–1.00)

## 2019-02-18 LAB — PROTIME-INR
INR: 0.94
Prothrombin Time: 12.5 seconds (ref 11.4–15.2)

## 2019-02-18 SURGERY — LEFT HEART CATH AND CORONARY ANGIOGRAPHY
Anesthesia: LOCAL

## 2019-02-18 MED ORDER — VERAPAMIL HCL 2.5 MG/ML IV SOLN: INTRAVENOUS | Status: DC | PRN

## 2019-02-18 MED ORDER — SODIUM CHLORIDE 0.9 % IV SOLN
INTRAVENOUS | Status: DC
  Administered 2019-02-18: 10 mL/h via INTRAVENOUS

## 2019-02-18 MED ORDER — HYDROCOD POLST-CPM POLST ER 10-8 MG/5ML PO SUER
5.0000 mL | Freq: Two times a day (BID) | ORAL | Status: DC | PRN
  Administered 2019-02-19: 5 mL via ORAL
  Filled 2019-02-18: qty 5

## 2019-02-18 MED ORDER — ASPIRIN 81 MG PO CHEW
324.0000 mg | CHEWABLE_TABLET | Freq: Once | ORAL | Status: AC
  Administered 2019-02-18: 324 mg via ORAL
  Filled 2019-02-18: qty 4

## 2019-02-18 MED ORDER — MOMETASONE FURO-FORMOTEROL FUM 100-5 MCG/ACT IN AERO
2.0000 | INHALATION_SPRAY | Freq: Two times a day (BID) | RESPIRATORY_TRACT | Status: DC
  Administered 2019-02-18 – 2019-02-19 (×2): 2 via RESPIRATORY_TRACT
  Filled 2019-02-18: qty 8.8

## 2019-02-18 MED ORDER — PREDNISONE 20 MG PO TABS
20.0000 mg | ORAL_TABLET | Freq: Every day | ORAL | Status: DC
  Administered 2019-02-19: 20 mg via ORAL
  Filled 2019-02-18: qty 1

## 2019-02-18 MED ORDER — IBUPROFEN 600 MG PO TABS
600.0000 mg | ORAL_TABLET | Freq: Three times a day (TID) | ORAL | Status: DC
  Administered 2019-02-18 – 2019-02-19 (×3): 600 mg via ORAL
  Filled 2019-02-18 (×3): qty 1

## 2019-02-18 MED ORDER — COLCHICINE 0.6 MG PO TABS
0.6000 mg | ORAL_TABLET | Freq: Two times a day (BID) | ORAL | Status: DC
  Administered 2019-02-18 – 2019-02-19 (×2): 0.6 mg via ORAL
  Filled 2019-02-18 (×2): qty 1

## 2019-02-18 MED ORDER — NITROGLYCERIN 1 MG/10 ML FOR IR/CATH LAB
INTRA_ARTERIAL | Status: AC
  Filled 2019-02-18: qty 10

## 2019-02-18 MED ORDER — HEPARIN (PORCINE) IN NACL 1000-0.9 UT/500ML-% IV SOLN
INTRAVENOUS | Status: AC
  Filled 2019-02-18: qty 1000

## 2019-02-18 MED ORDER — MORPHINE SULFATE (PF) 2 MG/ML IV SOLN
2.0000 mg | Freq: Once | INTRAVENOUS | Status: AC
  Administered 2019-02-18: 2 mg via INTRAVENOUS

## 2019-02-18 MED ORDER — LIDOCAINE HCL (PF) 1 % IJ SOLN
INTRAMUSCULAR | Status: AC
  Filled 2019-02-18: qty 30

## 2019-02-18 MED ORDER — SODIUM CHLORIDE 0.9 % IV SOLN: 250.0000 mL | INTRAVENOUS | Status: DC | PRN

## 2019-02-18 MED ORDER — HEPARIN SODIUM (PORCINE) 5000 UNIT/ML IJ SOLN
4000.0000 [IU] | Freq: Once | INTRAMUSCULAR | Status: AC
  Administered 2019-02-18: 4000 [IU] via INTRAVENOUS
  Filled 2019-02-18: qty 0.8

## 2019-02-18 MED ORDER — ALBUTEROL SULFATE (2.5 MG/3ML) 0.083% IN NEBU: 2.5000 mg | INHALATION_SOLUTION | RESPIRATORY_TRACT | Status: DC | PRN

## 2019-02-18 MED ORDER — MONTELUKAST SODIUM 10 MG PO TABS
10.0000 mg | ORAL_TABLET | Freq: Every day | ORAL | Status: DC
  Administered 2019-02-18: 10 mg via ORAL
  Filled 2019-02-18: qty 1

## 2019-02-18 MED ORDER — NITROGLYCERIN 0.4 MG SL SUBL
SUBLINGUAL_TABLET | SUBLINGUAL | Status: AC
  Administered 2019-02-18: 0.4 mg
  Filled 2019-02-18: qty 1

## 2019-02-18 MED ORDER — HEPARIN SODIUM (PORCINE) 1000 UNIT/ML IJ SOLN
INTRAMUSCULAR | Status: AC
  Filled 2019-02-18: qty 1

## 2019-02-18 MED ORDER — VERAPAMIL HCL 2.5 MG/ML IV SOLN
INTRAVENOUS | Status: AC
  Filled 2019-02-18: qty 2

## 2019-02-18 MED ORDER — HEPARIN SODIUM (PORCINE) 5000 UNIT/ML IJ SOLN
5000.0000 [IU] | Freq: Three times a day (TID) | INTRAMUSCULAR | Status: DC
  Administered 2019-02-18 – 2019-02-19 (×2): 5000 [IU] via SUBCUTANEOUS
  Filled 2019-02-18 (×2): qty 1

## 2019-02-18 MED ORDER — SODIUM CHLORIDE 0.9% FLUSH: 3.0000 mL | INTRAVENOUS | Status: DC | PRN

## 2019-02-18 MED ORDER — SODIUM CHLORIDE 0.9 % IV SOLN: INTRAVENOUS | Status: AC

## 2019-02-18 MED ORDER — ALBUTEROL SULFATE HFA 108 (90 BASE) MCG/ACT IN AERS: 2.0000 | INHALATION_SPRAY | RESPIRATORY_TRACT | Status: DC | PRN

## 2019-02-18 MED ORDER — IOHEXOL 350 MG/ML SOLN
INTRAVENOUS | Status: DC | PRN
  Administered 2019-02-18: 70 mL via INTRACARDIAC

## 2019-02-18 MED ORDER — MORPHINE SULFATE (PF) 2 MG/ML IV SOLN
INTRAVENOUS | Status: AC
  Filled 2019-02-18: qty 1

## 2019-02-18 MED ORDER — HEPARIN (PORCINE) IN NACL 1000-0.9 UT/500ML-% IV SOLN
INTRAVENOUS | Status: DC | PRN
  Administered 2019-02-18 (×2): 500 mL

## 2019-02-18 MED ORDER — LIDOCAINE HCL (PF) 1 % IJ SOLN
INTRAMUSCULAR | Status: DC | PRN
  Administered 2019-02-18: 15 mL
  Administered 2019-02-18: 2 mL

## 2019-02-18 MED ORDER — ALBUTEROL SULFATE (2.5 MG/3ML) 0.083% IN NEBU
2.5000 mg | INHALATION_SOLUTION | RESPIRATORY_TRACT | Status: DC | PRN
  Filled 2019-02-18: qty 3

## 2019-02-18 MED ORDER — BENZONATATE 100 MG PO CAPS
200.0000 mg | ORAL_CAPSULE | Freq: Three times a day (TID) | ORAL | Status: DC | PRN
  Administered 2019-02-18: 200 mg via ORAL
  Filled 2019-02-18 (×2): qty 2

## 2019-02-18 MED ORDER — SODIUM CHLORIDE 0.9% FLUSH
3.0000 mL | Freq: Two times a day (BID) | INTRAVENOUS | Status: DC
  Administered 2019-02-19: 3 mL via INTRAVENOUS

## 2019-02-18 SURGICAL SUPPLY — 16 items
CATH INFINITI 5FR MULTPACK ANG (CATHETERS) ×2 IMPLANT
CATH OPTITORQUE TIG 4.0 5F (CATHETERS) ×2 IMPLANT
CLOSURE MYNX CONTROL 6F/7F (Vascular Products) ×2 IMPLANT
DEVICE RAD COMP TR BAND LRG (VASCULAR PRODUCTS) ×2 IMPLANT
ELECT DEFIB PAD ADLT CADENCE (PAD) ×2 IMPLANT
GLIDESHEATH SLEND SS 6F .021 (SHEATH) ×2 IMPLANT
GUIDEWIRE INQWIRE 1.5J.035X260 (WIRE) ×1 IMPLANT
INQWIRE 1.5J .035X260CM (WIRE) ×2
KIT HEART LEFT (KITS) ×2 IMPLANT
PACK CARDIAC CATHETERIZATION (CUSTOM PROCEDURE TRAY) ×2 IMPLANT
SHEATH PINNACLE 6F 10CM (SHEATH) ×2 IMPLANT
SHEATH PROBE COVER 6X72 (BAG) ×2 IMPLANT
TRANSDUCER W/STOPCOCK (MISCELLANEOUS) ×2 IMPLANT
TUBING CIL FLEX 10 FLL-RA (TUBING) ×2 IMPLANT
WIRE HI TORQ VERSACORE-J 145CM (WIRE) ×2 IMPLANT
WIRE MICROINTRODUCER 60CM (WIRE) ×2 IMPLANT

## 2019-02-18 NOTE — ED Notes (Signed)
Code stemi called at 15:40.

## 2019-02-18 NOTE — ED Notes (Signed)
Per Tenny Craw, MD cardiologist give 2 mg morphine and nitro sublingual Verbal Order.

## 2019-02-18 NOTE — ED Provider Notes (Signed)
[facility]   CSN: [id number] Arrival date & time: 02/18/19  1502    History   Chief Complaint Chief Complaint  Patient presents with  . Asthma  . Shortness of Breath    HPI Melissa [name] is a 33 y.o. female.   Shortness of Breath  Associated symptoms: chest pain   Chest Pain  Pain location:  Substernal area and L chest Pain quality: aching   Pain radiates to:  Does not radiate Pain severity:  Severe Duration:  2 hours Timing:  Constant Chronicity:  New Associated symptoms: shortness of breath   Patient was recently hospital for asthma, breathing difficulties.  She was admitted on February 8 and discharged on February 13.  Patient started having recurring shortness of breath today.  In the last couple of hours she started having left-sided chest pressure aching in nature.  Past Medical History:  Diagnosis Date  . Abnormal Pap smear 2010   Repeat pap done;Hysteroscopy;Last pap 2012;was normal  . Anemia 2010   Iron supplements PP  . Asthma    Has a nebulizer;triggered by stress  . Colon polyps   . Headache(784.0)   . Heart attack [facility])    MD thinks may have had a heart attack;Cardiologist @ [facility]  . History of CVA (cerebrovascular accident) 04/09/2013   Pt reports presumptive "stroke" when she was 33y.o.; followed by [facility] neurology for "chronic nerve pain" and has been Rx'd Hydrocodone and Mobic  . Infection    BV;may get frequently  . Nerve pain    Chronic;d/t possible stroke and MI  . Placenta previa antepartum 2010   Placenta moved 3 days before baby was born  . Sickle cell trait (HCC)   . Stroke [facility]) 2013   MD thought may have had a stroke and heart attack;has neurologist @ [facility]    Patient Active Problem List   Diagnosis Date Noted  . Influenza A 02/11/2019  . Asthma exacerbation 02/07/2019  . Acute hypoxemic respiratory failure (HCC) 02/07/2019  . Abnormal vaginal bleeding 05/22/2016   . Mild persistent chronic asthma without complication 09/22/2015  . Laryngopharyngeal reflux (LPR)   . Dyspnea   . Physical deconditioning   . Chronic hypertension complicating or reason for care during pregnancy 11/26/2014  . Positive GBS test 11/26/2014  . Chronic pain syndrome 11/26/2014  . Obesity (BMI 30-39.9) 11/26/2014  . History of CVA (cerebrovascular accident) 04/09/2013  . Hx Endometrial polyp 02/16/2013  . Sickle cell trait (HCC) 02/16/2013  . Severe persistent asthma with allergic rhinitis with status asthmaticus 02/16/2013  . Migraines 02/16/2013    Past Surgical History:  Procedure Laterality Date  . HYSTEROSCOPY  2010     OB History    Gravida  3   Para  3   Term  3   Preterm      AB      Living  3     SAB      TAB      Ectopic      Multiple  0   Live Births  3            Home Medications    Prior to Admission medications   Medication Sig Start Date End Date Taking? Authorizing Provider  albuterol (PROVENTIL HFA;VENTOLIN HFA) 108 (90 BASE) MCG/ACT inhaler Inhale 2 puffs into the lungs every 4 (four) hours as needed for wheezing or shortness of breath. 08/29/15   Simonne Martinet, NP  albuterol (  PROVENTIL) (2.5 MG/3ML) 0.083% nebulizer solution Take 2.5 mg by nebulization every 4 (four) hours as needed for wheezing or shortness of breath.    [provider]  benzonatate (TESSALON) 200 MG capsule Take 1 capsule (200 mg total) by mouth 3 (three) times daily as needed for cough. 02/12/19   Rai, Delene Ruffiniipudeep K, MD  chlorpheniramine-HYDROcodone (TUSSIONEX) 10-8 MG/5ML SUER Take 5 mLs by mouth every 12 (twelve) hours as needed for cough. 02/12/19   Rai, Delene Ruffiniipudeep K, MD  etonogestrel (NEXPLANON) 68 MG IMPL implant 1 each by Subdermal route once.    [provider]  ibuprofen (ADVIL,MOTRIN) 600 MG tablet Take 600 mg by mouth every 6 (six) hours as needed.    [provider]  mometasone-formoterol (DULERA) 100-5 MCG/ACT AERO  Inhale 2 puffs into the lungs 2 (two) times daily. 02/12/19   Rai, Ripudeep K, MD  montelukast (SINGULAIR) 10 MG tablet Take 1 tablet (10 mg total) by mouth at bedtime. 02/12/19   Rai, Delene Ruffiniipudeep K, MD  predniSONE (DELTASONE) 10 MG tablet Prednisone dosing: Take  Prednisone 40mg  (4 tabs) x 3 days, then taper to 30mg  (3 tabs) x 3 days, then 20mg  (2 tabs) x 3days, then 10mg  (1 tab) x 3days, then OFF. 02/13/19   Rai, Delene Ruffiniipudeep K, MD    Family History Family History  Problem Relation Age of Onset  . Sickle cell trait Father   . Hypertension Mother   . Anemia Mother   . Asthma Mother   . Cervical cancer Paternal Aunt   . Cervical cancer Paternal Grandmother   . Diabetes Paternal Grandmother   . Diabetes Paternal Grandfather   . Diabetes Paternal Aunt   . Asthma Brother   . Asthma Daughter   . Asthma Son     Social History Social History   Tobacco Use  . Smoking status: Never Smoker  . Smokeless tobacco: Never Used  Substance Use Topics  . Alcohol use: Yes    Alcohol/week: 0.0 standard drinks    Comment: socially  . Drug use: No     Allergies   Ginger   Review of Systems Review of Systems  Respiratory: Positive for shortness of breath.   Cardiovascular: Positive for chest pain.  All other systems reviewed and are negative.    Physical Exam Updated Vital Signs BP (!) 151/109 (BP Location: Right Arm)   Pulse 100   Temp 98.6 F (37 C) (Oral)   Resp (!) 23   Ht 1.676 m (5\' 6" )   Wt 89.4 kg   LMP 02/18/2019   SpO2 100%   BMI 31.80 kg/m   Physical Exam Vitals signs and nursing note reviewed.  Constitutional:      General: She is not in acute distress.    Appearance: She is well-developed. She is ill-appearing and diaphoretic.  HENT:     Head: Normocephalic and atraumatic.     Right Ear: External ear normal.     Left Ear: External ear normal.  Eyes:     General: No scleral icterus.       Right eye: No discharge.        Left eye: No discharge.      Conjunctiva/sclera: Conjunctivae normal.  Neck:     Musculoskeletal: Neck supple.     Trachea: No tracheal deviation.  Cardiovascular:     Rate and Rhythm: Normal rate and regular rhythm.  Pulmonary:     Effort: Pulmonary effort is normal. No respiratory distress.     Breath sounds:  No stridor. Wheezing and rhonchi present. No rales.  Abdominal:     General: Bowel sounds are normal. There is no distension.     Palpations: Abdomen is soft.     Tenderness: There is no abdominal tenderness. There is no guarding or rebound.  Musculoskeletal:        General: No tenderness.  Skin:    General: Skin is warm.     Findings: No rash.  Neurological:     Mental Status: She is alert.     Cranial Nerves: No cranial nerve deficit (no facial droop, extraocular movements intact, no slurred speech).     Sensory: No sensory deficit.     Motor: No abnormal muscle tone or seizure activity.     Coordination: Coordination normal.     ED Treatments / Results  Labs (all labs ordered are listed, but only abnormal results are displayed) Labs Reviewed  APTT  CBC WITH DIFFERENTIAL/PLATELET  COMPREHENSIVE METABOLIC PANEL  LIPID PANEL  PROTIME-INR  TROPONIN I  I-STAT BETA HCG BLOOD, ED ([facility], [facility], [facility] ONLY)    EKG EKG Interpretation  Date/Time:  Wednesday February 18 2019 15:30:17 EST Ventricular Rate:  95 PR Interval:    QRS Duration: 84 QT Interval:  315 QTC Calculation: 396 R Axis:   74 Text Interpretation:  Sinus rhythm Borderline short PR interval Inferior infarct, acute (LCx) Lateral leads are also involved Baseline wander in lead(s) V2 V6 >>> Acute MI <<< ST elevation is new since prior tracing Confirmed by Linwood Dibbles [phone]) on 02/18/2019 3:59:36 PM   Radiology No results found.  Procedures .Critical Care Performed by: Linwood Dibbles, MD Authorized by: Linwood Dibbles, MD   Critical care provider statement:    Critical care time (minutes):  35   Critical care was time spent personally by me  on the following activities:  Discussions with consultants, evaluation of patient's response to treatment, examination of patient, ordering and performing treatments and interventions, ordering and review of laboratory studies, ordering and review of radiographic studies, pulse oximetry, re-evaluation of patient's condition, obtaining history from patient or surrogate and review of old charts   (including critical care time)  Medications Ordered in ED Medications  0.9 %  sodium chloride infusion (10 mL/hr Intravenous New Bag/Given 02/18/19 1546)  aspirin chewable tablet 324 mg (324 mg Oral Given 02/18/19 1546)  heparin injection 4,000 Units (4,000 Units Intravenous Given 02/18/19 1542)  morphine 2 MG/ML injection 2 mg (2 mg Intravenous Given 02/18/19 1550)  nitroGLYCERIN (NITROSTAT) 0.4 MG SL tablet (0.4 mg  Given 02/18/19 1554)     Initial Impression / Assessment and Plan / ED Course  I have reviewed the triage vital signs and the nursing notes.  Pertinent labs & imaging results that were available during my care of the patient were reviewed by me and considered in my medical decision making (see chart for details).    Patient presented to the emergency room for evaluation of shortness of breath and chest pain.  The patient's EKG is concerning for an acute ST elevation MI.  Code STEMI was activated.  Dr. Tenny Craw evaluated the patient in the ED as well.  Patient was given heparin as well as aspirin.  She will be emergently transferred to the Cath Lab at [facility].  Final Clinical Impressions(s) / ED Diagnoses   Final diagnoses:  ST elevation myocardial infarction (STEMI), unspecified artery (HCC)  Asthma with acute exacerbation, unspecified asthma severity, unspecified whether persistent      Linwood Dibbles,  MD 02/18/19 1601

## 2019-02-18 NOTE — ED Notes (Signed)
Patient transported via [facility] and [facility] the way to [facility] at this time.

## 2019-02-18 NOTE — Progress Notes (Signed)
TR BAND REMOVAL  LOCATION:    Radial rt radial  DEFLATED PER PROTOCOL:   yes  TIME BAND OFF / DRESSING APPLIED:    1845/gauze and tegaderm  SITE UPON ARRIVAL:    Level 0  SITE AFTER BAND REMOVAL:    Level 0  CIRCULATION SENSATION AND MOVEMENT:    Within Normal Limits :  Yes, rt hand and fingers warm and pink, sensation present; rt arm elevated on pillow  COMMENTS:   Instructions reviewed w/patient

## 2019-02-18 NOTE — ED Notes (Signed)
Report given to [facility] by [employer], RN. Paper work put in [employer] bin for [employer].

## 2019-02-18 NOTE — ED Notes (Signed)
[facility] at bedside 

## 2019-02-18 NOTE — H&P (Addendum)
Cardiology Admission History and Physical:   Patient ID: Melissa [name] MRN: [id number]; DOB: 1986-09-03   Admission date: 02/18/2019  Primary Care Provider: Glendale Chard, MD Primary Cardiologist: New to [facility] Primary Electrophysiologist:  None   Chief Complaint:  Chest pain   Patient Profile:   Melissa [name] is a 33 y.o. AA female, nonsmoker w/ history of asthma, recent hospitalization for acute hypoxic respiratory failure 2/2 asthma exacerbation and flu but with no prior cardiac history, presenting as a CODE STEMI w/ active CP and inferolateral ST elevations.   History of Present Illness:   Melissa [name] is a 33 y.o. AA female, nonsmoker w/ history of asthma, recent hospitalization for acute hypoxic respiratory failure 2/2 asthma exacerbation and flu but with no prior cardiac history, presenting as a CODE STEMI w/ active CP and inferolateral ST elevations.   She denies any family history of CAD. Family history is notable for HTN in mother and DM in paternal aunt. She is on inhalers for asthma but no other medications. She reports an allergy to ginger (respiratory distress/ anaphylaxis). There is also noted in her medical records a diagnosis of presumptive "stoke" but no records to confirm this.   She was admitted from 2/8-2/13 for acute hypoxic respiratory failure 2/2 acute asthma exacerbation and also tested + for influenza A. She was treated by IM. Got IV steroids and inhalers and asthma improved. She received Tamiflu for influenza.   She was discharged home on 2/13. Pt decided to go back to work today but felt that she went back too early. She has had CP x 2 days, but CP worsened today. Described as SS chest pressure/ tightness. No radiation. sSe notes mild dyspnea. Pt initially went to [facility] ED and was found to have inferolateral ST elevations. Pt was given 325 mg of ASA, 4,000 U heparin, 2SL NGT, placed on supplemental O2 and transferred to [facility] for emergent cardiac cath.   On arrival to  [facility], she is A&Ox3. BP elevated at 151/109. She continues with active CP, rated 2/10.    Past Medical History:  Diagnosis Date  . Abnormal Pap smear 2010   Repeat pap done;Hysteroscopy;Last pap 2012;was normal  . Anemia 2010   Iron supplements PP  . Asthma    Has a nebulizer;triggered by stress  . Colon polyps   . Headache(784.0)   . Heart attack [facility])    MD thinks may have had a heart attack;Cardiologist @ [facility]  . History of CVA (cerebrovascular accident) 04/09/2013   Pt reports presumptive "stroke" when she was 33y.o.; followed by [facility] neurology for "chronic nerve pain" and has been Rx'd Hydrocodone and Mobic  . Infection    BV;may get frequently  . Nerve pain    Chronic;d/t possible stroke and MI  . Placenta previa antepartum 2010   Placenta moved 3 days before baby was born  . Sickle cell trait ([place])   . Stroke [facility]) 2013   MD thought may have had a stroke and heart attack;has neurologist @ [facility]    Past Surgical History:  Procedure Laterality Date  . HYSTEROSCOPY  2010     Medications Prior to Admission: Prior to Admission medications   Medication Sig Start Date End Date Taking? Authorizing Provider  albuterol (PROVENTIL HFA;VENTOLIN HFA) 108 (90 BASE) MCG/ACT inhaler Inhale 2 puffs into the lungs every 4 (four) hours as needed for wheezing or shortness of breath. 08/29/15   Erick Colace, NP  albuterol (PROVENTIL) (2.5 MG/3ML) 0.083%  nebulizer solution Take 2.5 mg by nebulization every 4 (four) hours as needed for wheezing or shortness of breath.    [provider]  benzonatate (TESSALON) 200 MG capsule Take 1 capsule (200 mg total) by mouth 3 (three) times daily as needed for cough. 02/12/19   Rai, Vernelle Emerald, MD  chlorpheniramine-HYDROcodone (TUSSIONEX) 10-8 MG/5ML SUER Take 5 mLs by mouth every 12 (twelve) hours as needed for cough. 02/12/19   Rai, Vernelle Emerald, MD  etonogestrel (NEXPLANON) 68 MG IMPL implant 1 each by Subdermal route once.     [provider]  ibuprofen (ADVIL,MOTRIN) 600 MG tablet Take 600 mg by mouth every 6 (six) hours as needed.    [provider]  mometasone-formoterol (DULERA) 100-5 MCG/ACT AERO Inhale 2 puffs into the lungs 2 (two) times daily. 02/12/19   Rai, Ripudeep K, MD  montelukast (SINGULAIR) 10 MG tablet Take 1 tablet (10 mg total) by mouth at bedtime. 02/12/19   Rai, Vernelle Emerald, MD  predniSONE (DELTASONE) 10 MG tablet Prednisone dosing: Take  Prednisone '40mg'$  (4 tabs) x 3 days, then taper to '30mg'$  (3 tabs) x 3 days, then '20mg'$  (2 tabs) x 3days, then '10mg'$  (1 tab) x 3days, then OFF. 02/13/19   Rai, Vernelle Emerald, MD     Allergies:    Allergies  Allergen Reactions  . Ginger Anaphylaxis    Social History:   Social History   Socioeconomic History  . Marital status: Single    Spouse name: Not on file  . Number of children: 1  . Years of education: 34  . Highest education level: Not on file  Occupational History  . Occupation: Geneticist, molecular:  spare time  Social Needs  . Financial resource strain: Not on file  . Food insecurity:    Worry: Not on file    Inability: Not on file  . Transportation needs:    Medical: Not on file    Non-medical: Not on file  Tobacco Use  . Smoking status: Never Smoker  . Smokeless tobacco: Never Used  Substance and Sexual Activity  . Alcohol use: Yes    Alcohol/week: 0.0 standard drinks    Comment: socially  . Drug use: No  . Sexual activity: Yes    Birth control/protection: None  Lifestyle  . Physical activity:    Days per week: Not on file    Minutes per session: Not on file  . Stress: Not on file  Relationships  . Social connections:    Talks on phone: Not on file    Gets together: Not on file    Attends religious service: Not on file    Active member of club or organization: Not on file    Attends meetings of clubs or organizations: Not on file    Relationship status: Not on file  . Intimate partner violence:    Fear of  current or ex partner: Not on file    Emotionally abused: Not on file    Physically abused: Not on file    Forced sexual activity: Not on file  Other Topics Concern  . Not on file  Social History Narrative   Lives at home with her three children.   Right-handed.   1 cup caffeine daily.    Family History:  The patient's family history includes Anemia in her mother; Asthma in her brother, daughter, mother, and son; Cervical cancer in her paternal aunt and paternal grandmother; Diabetes in her paternal aunt, paternal grandfather,  and paternal grandmother; Hypertension in her mother; Sickle cell trait in her father.    ROS:  Please see the history of present illness.  All other ROS reviewed and negative.     Physical Exam/Data:   Vitals:   02/18/19 1526 02/18/19 1531 02/18/19 1552 02/18/19 1620  BP: (!) 125/108  (!) 151/109   Pulse: (!) 101  100   Resp: (!) 23  (!) 23   Temp: 98.6 F (37 C)     TempSrc: Oral     SpO2: 100%  100% 100%  Weight:  89.4 kg    Height:  '5\' 6"'$  (1.676 m)     No intake or output data in the 24 hours ending 02/18/19 1640 Last 3 Weights 02/18/2019 02/07/2019 09/22/2015  Weight (lbs) 197 lb 188 lb 7.9 oz 207 lb  Weight (kg) 89.359 kg 85.5 kg 93.895 kg     Body mass index is 31.8 kg/m.  General:  Well nourished, well developed, young AAF in no acute distress HEENT: normal Lymph: no adenopathy Neck: no JVD Endocrine:  No thryomegaly Vascular: No carotid bruits; FA pulses 2+ bilaterally without bruits  Cardiac:  normal S1, S2; RRR; no murmur  Lungs:  clear to auscultation bilaterally, no wheezing, rhonchi or rales  Abd: soft, nontender, no hepatomegaly  Ext: no edema Musculoskeletal:  No deformities, BUE and BLE strength normal and equal Skin: warm and dry  Neuro:  CNs 2-12 intact, no focal abnormalities noted Psych:  Normal affect    EKG:  The ECG that was done 02/18/19 was personally reviewed and demonstrates inferolateral ST elevations  Relevant CV  Studies: Emergent Cardiac Catheterization 02/18/19 - report pending.  Laboratory Data:  Chemistry Recent Labs  Lab 02/12/19 0602 02/18/19 1548  NA 138 137  K 4.3 4.2  CL 106 105  CO2 26 21*  GLUCOSE 128* 142*  BUN 15 14  CREATININE 0.68 0.78  CALCIUM 8.9 9.4  GFRNONAA >60 >60  GFRAA >60 >60  ANIONGAP 6 11    Recent Labs  Lab 02/18/19 1548  PROT 7.9  ALBUMIN 4.1  AST 36  ALT 67*  ALKPHOS 68  BILITOT 0.8   Hematology Recent Labs  Lab 02/12/19 0602 02/18/19 1548  WBC 13.7* 14.6*  RBC 4.52 4.87  HGB 11.6* 12.1  HCT 38.9 40.8  MCV 86.1 83.8  MCH 25.7* 24.8*  MCHC 29.8* 29.7*  RDW 14.6 14.6  PLT 220 335   Cardiac Enzymes Recent Labs  Lab 02/18/19 1548  TROPONINI <0.03   No results for input(s): TROPIPOC in the last 168 hours.  BNPNo results for input(s): BNP, PROBNP in the last 168 hours.  DDimer No results for input(s): DDIMER in the last 168 hours.  Radiology/Studies:  No results found.  Assessment and Plan:   Melissa [name] is a 33 y.o. AA female, nonsmoker w/ history of asthma, recent hospitalization for acute hypoxic respiratory failure 2/2 asthma exacerbation and flu but with no prior cardiac history, presenting as a CODE STEMI w/ active CP and inferolateral ST elevations.   1. STEMI: active CP w/ inferolateral ST elevations in a young 33 y/o female with no known cardiac risk factors. Differential diagnosis includes CAD w/ acute plaque rupture vs SCAD. Plan emergent cardiac cath for definitive assessment. If normal coronaries, ? Vasospasm. Also given recent influenza, ? Pericarditis/myopericarditis, although would typically see diffuse ST elevations in this case.   Further management TBD based on cath findings. Given severity of her asthma, avoid  blockers.  Will obtain 2D Echo. Cycle troponins x 3. Check FLP and Hgb A1c. F/u BMP in the morning. Will check inflammatory markers, ESR and CRP.     For questions or updates, please contact [facility] Please consult [email or web address] for contact info under        Signed, Lyda Jester, PA-C  02/18/2019 4:40 PM

## 2019-02-18 NOTE — ED Triage Notes (Signed)
Patient states she was discharged  6 days ago for respiratory failure and hypoxemia.  Patient states she was due to go back to work today and has increased SOB and is having pain when she breathes shallow or takes a deep breath.  patient states she has noticed that her lips were blue at home, but not in triage.  patient c/o mid chest pain and states she has been hurting x 2-3 days, but today is worse.

## 2019-02-19 ENCOUNTER — Inpatient Hospital Stay (HOSPITAL_COMMUNITY): Payer: BLUE CROSS/BLUE SHIELD

## 2019-02-19 ENCOUNTER — Encounter (HOSPITAL_COMMUNITY): Payer: Self-pay | Admitting: Cardiology

## 2019-02-19 DIAGNOSIS — R079 Chest pain, unspecified: Secondary | ICD-10-CM | POA: Diagnosis not present

## 2019-02-19 DIAGNOSIS — D571 Sickle-cell disease without crisis: Secondary | ICD-10-CM | POA: Diagnosis not present

## 2019-02-19 DIAGNOSIS — R9431 Abnormal electrocardiogram [ECG] [EKG]: Secondary | ICD-10-CM | POA: Diagnosis not present

## 2019-02-19 DIAGNOSIS — I213 ST elevation (STEMI) myocardial infarction of unspecified site: Secondary | ICD-10-CM | POA: Diagnosis not present

## 2019-02-19 LAB — BASIC METABOLIC PANEL
Anion gap: 9 (ref 5–15)
BUN: 10 mg/dL (ref 6–20)
CO2: 22 mmol/L (ref 22–32)
Calcium: 8.4 mg/dL — ABNORMAL LOW (ref 8.9–10.3)
Chloride: 109 mmol/L (ref 98–111)
Creatinine, Ser: 0.85 mg/dL (ref 0.44–1.00)
GFR calc Af Amer: >60 mL/min (ref >60)
Glucose, Bld: 130 mg/dL — ABNORMAL HIGH (ref 70–99)
Potassium: 3.5 mmol/L (ref 3.5–5.1)
Sodium: 140 mmol/L (ref 135–145)

## 2019-02-19 LAB — CBC
HCT: 33.3 % — ABNORMAL LOW (ref 36.0–46.0)
Hemoglobin: 10.3 g/dL — ABNORMAL LOW (ref 12.0–15.0)
MCH: 25.4 pg — ABNORMAL LOW (ref 26.0–34.0)
MCHC: 30.9 g/dL (ref 30.0–36.0)
MCV: 82.2 fL (ref 80.0–100.0)
Platelets: 224 10*3/uL (ref 150–400)
RBC: 4.05 MIL/uL (ref 3.87–5.11)
RDW: 14.7 % (ref 11.5–15.5)
WBC: 12.7 10*3/uL — ABNORMAL HIGH (ref 4.0–10.5)
nRBC: 0 % (ref 0.0–0.2)

## 2019-02-19 LAB — ECHOCARDIOGRAM COMPLETE
Height: 66 in
WEIGHTICAEL: 3152 [oz_av]

## 2019-02-19 LAB — SEDIMENTATION RATE: Sed Rate: 7 mm/hr (ref 0–22)

## 2019-02-19 LAB — TROPONIN I
Troponin I: <0.03 ng/mL (ref <0.03)
Troponin I: <0.03 ng/mL (ref <0.03)

## 2019-02-19 MED ORDER — AMLODIPINE BESYLATE 2.5 MG PO TABS: 2.5000 mg | ORAL_TABLET | Freq: Every day | ORAL | 3 refills | Status: DC

## 2019-02-19 MED ORDER — AMLODIPINE BESYLATE 5 MG PO TABS
2.5000 mg | ORAL_TABLET | Freq: Every day | ORAL | Status: DC
  Administered 2019-02-19: 2.5 mg via ORAL
  Filled 2019-02-19: qty 1

## 2019-02-19 MED FILL — Verapamil HCl IV Soln 2.5 MG/ML: INTRAVENOUS | Qty: 2 | Status: AC

## 2019-02-19 MED FILL — Nitroglycerin IV Soln 100 MCG/ML in D5W: INTRA_ARTERIAL | Qty: 10 | Status: AC

## 2019-02-19 NOTE — Progress Notes (Signed)
Progress Note  Patient Name: Melissa [name] Date of Encounter: 02/19/2019  Primary Cardiologist:New  Subjective   No signif CP   No SOB    Inpatient Medications    Scheduled Meds: . colchicine  0.6 mg Oral BID  . heparin  5,000 Units Subcutaneous Q8H  . ibuprofen  600 mg Oral TID WC  . mometasone-formoterol  2 puff Inhalation BID  . montelukast  10 mg Oral QHS  . predniSONE  20 mg Oral Q breakfast  . sodium chloride flush  3 mL Intravenous Q12H   Continuous Infusions: . sodium chloride 10 mL/hr (02/18/19 1546)  . sodium chloride     PRN Meds: sodium chloride, albuterol, benzonatate, chlorpheniramine-HYDROcodone, sodium chloride flush   Vital Signs    Vitals:   02/18/19 1917 02/19/19 0740 02/19/19 0748 02/19/19 0750  BP: 132/81 121/76  121/76  Pulse: 71 82  82  Resp:  18  18  Temp: 98.2 F (36.8 C) 98 F (36.7 C)  98 F (36.7 C)  TempSrc: Oral   Oral  SpO2: 100% 100% 99% 100%  Weight:      Height:        Intake/Output Summary (Last 24 hours) at 02/19/2019 1132 Last data filed at 02/18/2019 1600 Gross per 24 hour  Intake -  Output 450 ml  Net -450 ml   Last 3 Weights 02/18/2019 02/07/2019 09/22/2015  Weight (lbs) 197 lb 188 lb 7.9 oz 207 lb  Weight (kg) 89.359 kg 85.5 kg 93.895 kg      Telemetry    SR  - Personally Reviewed  ECG    Physical Exam   GEN: No acute distress.   Neck: No JVD Cardiac: RRR, no murmurs, rubs, or gallops.  Respiratory: Clear to auscultation bilaterally.  No wheezes Chest  Tender    GI: Soft, nontender, non-distended  MS: No edema; No deformity. Neuro:  Nonfocal  Psych: Normal affect   Labs    Chemistry Recent Labs  Lab 02/18/19 1548 02/18/19 1635 02/18/19 1639 02/18/19 1947 02/19/19 0106  NA 137  --  142  --  140  K 4.2  --  3.9  --  3.5  CL 105  --   --   --  109  CO2 21*  --   --   --  22  GLUCOSE 142*  --   --   --  130*  BUN 14  --   --   --  10  CREATININE 0.78 0.50  --  0.73 0.85  CALCIUM 9.4  --   --    --  8.4*  PROT 7.9  --   --   --   --   ALBUMIN 4.1  --   --   --   --   AST 36  --   --   --   --   ALT 67*  --   --   --   --   ALKPHOS 68  --   --   --   --   BILITOT 0.8  --   --   --   --   GFRNONAA >60  --   --  >60 >60  GFRAA >60  --   --  >60 >60  ANIONGAP 11  --   --   --  9     Hematology Recent Labs  Lab 02/18/19 1548 02/18/19 1639 02/18/19 1947 02/19/19 0106  WBC 14.6*  --  13.5* 12.7*  RBC 4.87  --  4.10 4.05  HGB 12.1 11.2* 10.5* 10.3*  HCT 40.8 33.0* 33.4* 33.3*  MCV 83.8  --  81.5 82.2  MCH 24.8*  --  25.6* 25.4*  MCHC 29.7*  --  31.4 30.9  RDW 14.6  --  14.6 14.7  PLT 335  --  269 224    Cardiac Enzymes Recent Labs  Lab 02/18/19 1548 02/18/19 1947 02/19/19 0106 02/19/19 0705  TROPONINI <0.03 <0.03 <0.03 <0.03   No results for input(s): TROPIPOC in the last 168 hours.   BNPNo results for input(s): BNP, PROBNP in the last 168 hours.   DDimer No results for input(s): DDIMER in the last 168 hours.   Radiology    Dg Chest Port 1 View  Result Date: 02/18/2019 CLINICAL DATA:  Chest pain x1 day. EXAM: PORTABLE CHEST 1 VIEW COMPARISON:  02/07/2019 FINDINGS: The heart size and mediastinal contours are within normal limits. Both lungs are clear. The visualized skeletal structures are unremarkable. IMPRESSION: No active disease. Electronically Signed   By: Tollie Eth M.D.   On: 02/18/2019 20:31    Cardiac Studies   LV end diastolic pressure is normal.  The left ventricular systolic function is normal.  There is no aortic valve stenosis.  The left ventricular ejection fraction is 55-65% by visual estimate.   SUMMARY  Angiographically normal coronary arteries  Normal LVEF and EDP.  No regional wall motion normality on LV gram.   RECOMMENDATIONS  Admit to telemetry with presumed diagnosis of pericarditis.  Check 2D echocardiogram and inflammatory markers.  Start colchicine and ibuprofen.  Monitor closely for exacerbation of respiratory  issues.    Patient Profile     33 y.o. female admitted yesterday with CP, ST elevation in inferior and anterolateral leads  Assessment & Plan   1  Chest pain  Pt recently left hsp   Admitted with influenza A  Asthma   Sent on on 2/13    After d/c had progressive tightnesss in chest    Yesteerday when seen in ED said CP started 2 hours prior to arrival to ED   EKG with acute ST changes    Sent to cath lab    Note 2 mg MSO4 lead to relief of pain    Pt still with some pleuritc CP    Mild   Also some CP with pressing on chest   Cath yesterday with nomal coronary arteries   Normal filling pressures Echo today with normal LVEF   Trivial  Pericardial effusion (may be normal) I think she had some vasospasm   I am not convinced she  has pericarditis  (no rub, not positional)   Some of her pain too is musculoskeletal   SOme is pleuritic with recent flu and asthma Will repeat EKG today   I would not send home on colchicine   Can take ibuprofen      I would treat with low dose amlodipine 2.5 mg for BP and possible spasm  Follow up as outpt  2  HTN  BP high yesterday   Try 2.5 mg amlodipine   3  Asthma   NO wheezing on exam  Continue inhalers    Keep on prednisone taper    She has appt with Jerilee Hoh tomorrow.     3.  HCM   Lipids good  LDL 93   HDL 73       For questions or updates, please contact [facility] Please consult [email or web address]  for contact info under        Signed, Dietrich Pates, MD  02/19/2019, 11:32 AM

## 2019-02-19 NOTE — Progress Notes (Signed)
SATURATION QUALIFICATIONS: (This note is used to comply with regulatory documentation for home oxygen)  Patient Saturations on Room Air at Rest = 100%  Patient Saturations on Room Air while Ambulating = 94%  Patient Saturations on 0 Liters of oxygen while Ambulating = 94%  Patient ambulated 100 ft. Tolerated well

## 2019-02-19 NOTE — Discharge Summary (Signed)
Discharge Summary    Patient ID: TENLEIGH ORQUIZ MRN: [id number]; DOB: 09-24-1986  Admit date: 02/18/2019 Discharge date: 02/19/2019  Primary Care Provider: Dorothyann Peng, MD  Primary Cardiologist: Dietrich Pates, MD  Primary Electrophysiologist:  None   Discharge Diagnoses    Principal Problem:   Chest pain with high risk of acute coronary syndrome - vs. acute pericarditis Active Problems:   Severe persistent asthma with allergic rhinitis with status asthmaticus   ST elevation   Acute pericarditis   Chest pain   Allergies Allergies  Allergen Reactions  . Ginger Anaphylaxis    Diagnostic Studies/Procedures    LEFT HEART CATH AND CORONARY ANGIOGRAPHY 02/18/19  Conclusion    LV end diastolic pressure is normal.  The left ventricular systolic function is normal.  There is no aortic valve stenosis.  The left ventricular ejection fraction is 55-65% by visual estimate.   SUMMARY  Angiographically normal coronary arteries  Normal LVEF and EDP.  No regional wall motion normality on LV gram.  RECOMMENDATIONS  Admit to telemetry with presumed diagnosis of pericarditis.  Check 2D echocardiogram and inflammatory markers.  Start colchicine and ibuprofen.  Monitor closely for exacerbation of respiratory issues.  Bryan Lemma, MD  _____________  Echocardiogram 02/19/19 IMPRESSIONS  1. The left ventricle has normal systolic function with an ejection fraction of 60-65%. The cavity size was normal. Left ventricular diastolic parameters were normal No evidence of left ventricular regional wall motion abnormalities.  2. The right ventricle has normal systolic function. The cavity was normal. There is no increase in right ventricular wall thickness.  3. The mitral valve is normal in structure.  4. The tricuspid valve is normal in structure.  5. The aortic valve is normal in structure.  6. The pulmonic valve was normal in structure.  7. No evidence of left ventricular  regional wall motion abnormalities.  FINDINGS  Left Ventricle: The left ventricle has normal systolic function, with an ejection fraction of 60-65%. The cavity size was normal. There is no increase in left ventricular wall thickness. Left ventricular diastolic parameters were normal No evidence of  left ventricular regional wall motion abnormalities.. Right Ventricle: The right ventricle has normal systolic function. The cavity was normal. There is no increase in right ventricular wall thickness. Left Atrium: left atrial size was normal in size Right Atrium: right atrial size was normal in size. Interatrial Septum: No atrial level shunt detected by color flow Doppler. Pericardium: There is no evidence of pericardial effusion. Mitral Valve: The mitral valve is normal in structure. Mitral valve regurgitation is trivial by color flow Doppler. Tricuspid Valve: The tricuspid valve is normal in structure. Tricuspid valve regurgitation was not visualized by color flow Doppler. Aortic Valve: The aortic valve is normal in structure. Aortic valve regurgitation was not visualized by color flow Doppler. Pulmonic Valve: The pulmonic valve was normal in structure. Pulmonic valve regurgitation is not visualized by color flow Doppler. Venous: The inferior vena cava is normal in size with greater than 50% respiratory variability.     History of Present Illness     Melissa [name] is a 33 y.o. AA female, nonsmoker w/ history of asthma, recent hospitalization for acute hypoxic respiratory failure 2/2 asthma exacerbation and flu but with no prior cardiac history, presenting as a CODE STEMI w/ active CP and inferolateral ST elevations.   She denies any family history of CAD. Family history is notable for HTN in mother and DM in paternal aunt. She is on inhalers for asthma  but no other medications. She reports an allergy to ginger (respiratory distress/ anaphylaxis). There is also noted in her medical records a  diagnosis of presumptive "stroke" but no records to confirm this.   She was admitted from 2/8-2/13 for acute hypoxic respiratory failure 2/2 acute asthma exacerbation and also tested + for influenza A. She was treated by IM. Got IV steroids and inhalers and asthma improved. She received Tamiflu for influenza.   She was discharged home on 2/13. Pt decided to go back to work today but felt that she went back too early. She has had CP x 2 days, but CP worsened today. Described as SS chest pressure/ tightness. No radiation. She notes mild dyspnea. Pt initially went to [facility] ED and was found to have inferolateral ST elevations. Pt was given 325 mg of ASA, 4,000 U heparin, 2SL NGT, placed on supplemental O2 and transferred to [facility] for emergent cardiac cath.    Hospital Course     Consultants: None  She was brought directly to cardaic catheterization lab 02/18/19.  Initial attempt radial access was unsuccessful because of spasm. Swithced to femoral access.  Groin angiography was performed showing no evidence of coronary disease.  On the monitor, her inferior lateral ST elevations seem to have resolved and she was no longer having chest pain. Note, 2 gm morphine relieved her pain.   LV gram showed relative normal EF with no regional wall motion normalities.  Normal EDP. Echo showed normal LVEF with trivial pericardial effusion (may be normal).   Pt still has some pleuritic chest pain, mild, and some pressing feeling on her chest. Some of her pain seems to be musculoskeletal, also could have some vasospasm. Dr. Tenny Craw is not convinced of pericarditis (no rub, not positional). Some pleuritic pain may be related to recent flu, asthma. Prefer not to send home on colchicine. She can take ibuprofen. We will start amlodipine 2.5 mg for BP and possible coronary spasm.   Follow up EKG today shows no further ST elevation.   She has an appt with Dr. Sherene Sires tomorrow, pulmonology. She will continue on inhalers and  prednisone taper.   She will follow up with Dr. Tenny Craw in the office.   Patient has been seen by Dr. Tenny Craw today and deemed ready for discharge home. All follow up appointments have been scheduled. Discharge medications are listed below.  _____________  Discharge Vitals Blood pressure 121/76, pulse 82, temperature 98 F (36.7 C), temperature source Oral, resp. rate 18, height 5\' 6"  (1.676 m), weight 89.4 kg, last menstrual period 02/18/2019, SpO2 100 %, unknown if currently breastfeeding.  Filed Weights   02/18/19 1531  Weight: 89.4 kg    Labs & Radiologic Studies    CBC Recent Labs    02/18/19 1548  02/18/19 1947 02/19/19 0106  WBC 14.6*  --  13.5* 12.7*  NEUTROABS 12.0*  --   --   --   HGB 12.1   < > 10.5* 10.3*  HCT 40.8   < > 33.4* 33.3*  MCV 83.8  --  81.5 82.2  PLT 335  --  269 224   < > = values in this interval not displayed.   Basic Metabolic Panel Recent Labs    21/30/86 1548  02/18/19 1639 02/18/19 1947 02/19/19 0106  NA 137  --  142  --  140  K 4.2  --  3.9  --  3.5  CL 105  --   --   --  109  CO2 21*  --   --   --  22  GLUCOSE 142*  --   --   --  130*  BUN 14  --   --   --  10  CREATININE 0.78   < >  --  0.73 0.85  CALCIUM 9.4  --   --   --  8.4*   < > = values in this interval not displayed.   Liver Function Tests Recent Labs    02/18/19 1548  AST 36  ALT 67*  ALKPHOS 68  BILITOT 0.8  PROT 7.9  ALBUMIN 4.1   No results for input(s): LIPASE, AMYLASE in the last 72 hours. Cardiac Enzymes Recent Labs    02/18/19 1947 02/19/19 0106 02/19/19 0705  TROPONINI <0.03 <0.03 <0.03   BNP Invalid input(s): POCBNP D-Dimer No results for input(s): DDIMER in the last 72 hours. Hemoglobin A1C No results for input(s): HGBA1C in the last 72 hours. Fasting Lipid Panel Recent Labs    02/18/19 1548  CHOL 173  HDL 73  LDLCALC 93  TRIG 33  CHOLHDL 2.4   Thyroid Function Tests No results for input(s): TSH, T4TOTAL, T3FREE, THYROIDAB in the last  72 hours.  Invalid input(s): FREET3 _____________  Dg Chest 2 View  Result Date: 02/07/2019 CLINICAL DATA:  Shortness of breath with cough and congestion EXAM: CHEST - 2 VIEW COMPARISON:  September 22, 2015 FINDINGS: There is no edema or consolidation. The heart size and pulmonary vascularity are normal. No adenopathy. No bone lesions. IMPRESSION: No edema or consolidation. Electronically Signed   By: Bretta Bang III M.D.   On: 02/07/2019 10:14   Dg Chest Port 1 View  Result Date: 02/18/2019 CLINICAL DATA:  Chest pain x1 day. EXAM: PORTABLE CHEST 1 VIEW COMPARISON:  02/07/2019 FINDINGS: The heart size and mediastinal contours are within normal limits. Both lungs are clear. The visualized skeletal structures are unremarkable. IMPRESSION: No active disease. Electronically Signed   By: Tollie Eth M.D.   On: 02/18/2019 20:31   Disposition   Pt is being discharged home today in good condition.  Follow-up Plans & Appointments    Follow-up Information    Pricilla Riffle, MD Follow up.   Specialty:  Cardiology Why:  Cardiology hospital follow up on 03/23/19 at 2:00. Please arrive 10 minutes early for check in.  Contact information: [address] [place] [address] [phone]          Discharge Instructions    Diet - low sodium heart healthy   Complete by:  As directed    Discharge instructions   Complete by:  As directed    PLEASE REMEMBER TO BRING ALL OF YOUR MEDICATIONS TO EACH OF YOUR FOLLOW-UP OFFICE VISITS.  PLEASE ATTEND ALL SCHEDULED FOLLOW-UP APPOINTMENTS.   Activity: Increase activity slowly as tolerated. You may shower, but no soaking baths (or swimming) for 1 week. No driving for 24 hours. No lifting over 5 lbs for 1 week. No sexual activity for 1 week.   You May Return to Work: in 5 days (if applicable)  Wound Care: You may wash cath site gently with soap and water. Keep cath site clean and dry. If you notice pain, swelling, bleeding or pus at  your cath site, please call [phone].   Increase activity slowly   Complete by:  As directed       Discharge Medications   Allergies as of 02/19/2019      Reactions  Ginger Anaphylaxis      Medication List    TAKE these medications   albuterol 108 (90 Base) MCG/ACT inhaler Commonly known as:  PROVENTIL HFA;VENTOLIN HFA Inhale 2 puffs into the lungs every 4 (four) hours as needed for wheezing or shortness of breath.   albuterol (2.5 MG/3ML) 0.083% nebulizer solution Commonly known as:  PROVENTIL Take 2.5 mg by nebulization every 4 (four) hours as needed for wheezing or shortness of breath.   amLODipine 2.5 MG tablet Commonly known as:  NORVASC Take 1 tablet (2.5 mg total) by mouth daily.   benzonatate 200 MG capsule Commonly known as:  TESSALON Take 1 capsule (200 mg total) by mouth 3 (three) times daily as needed for cough.   chlorpheniramine-HYDROcodone 10-8 MG/5ML Suer Commonly known as:  TUSSIONEX Take 5 mLs by mouth every 12 (twelve) hours as needed for cough.   ibuprofen 600 MG tablet Commonly known as:  ADVIL,MOTRIN Take 600 mg by mouth every 6 (six) hours as needed for mild pain.   mometasone-formoterol 100-5 MCG/ACT Aero Commonly known as:  DULERA Inhale 2 puffs into the lungs 2 (two) times daily.   montelukast 10 MG tablet Commonly known as:  SINGULAIR Take 1 tablet (10 mg total) by mouth at bedtime.   NEXPLANON 68 MG Impl implant Generic drug:  etonogestrel 1 each by Subdermal route once.   predniSONE 10 MG tablet Commonly known as:  DELTASONE Prednisone dosing: Take  Prednisone 40mg  (4 tabs) x 3 days, then taper to 30mg  (3 tabs) x 3 days, then 20mg  (2 tabs) x 3days, then 10mg  (1 tab) x 3days, then OFF. What changed:    how much to take  how to take this  when to take this        Acute coronary syndrome (MI, NSTEMI, STEMI, etc) this admission?: No. Pt found to have normal coronary arteries on cath.    Outstanding Labs/Studies    none  Duration of Discharge Encounter   Greater than 30 minutes including physician time.  Signed, Berton BonJanine Brad Mcgaughy, NP 02/19/2019, 12:53 PM

## 2019-02-19 NOTE — Progress Notes (Signed)
Echocardiogram 2D Echocardiogram has been performed.  Melissa [name] 02/19/2019, 8:42 AM

## 2019-02-20 ENCOUNTER — Encounter: Payer: Self-pay | Admitting: *Deleted

## 2019-02-20 ENCOUNTER — Encounter: Payer: Self-pay | Admitting: Internal Medicine

## 2019-02-20 ENCOUNTER — Ambulatory Visit: Payer: BLUE CROSS/BLUE SHIELD | Admitting: Internal Medicine

## 2019-02-20 VITALS — BP 118/72 | HR 110 | Ht 65.0 in | Wt 202.0 lb

## 2019-02-20 DIAGNOSIS — J4530 Mild persistent asthma, uncomplicated: Secondary | ICD-10-CM | POA: Diagnosis not present

## 2019-02-20 NOTE — Patient Instructions (Signed)
Plan A = Automatic = dulera 100 Take 2 puffs first thing in am and then another 2 puffs about 12 hours later.   Work on inhaler technique:  relax and gently blow all the way out then take a nice smooth deep breath back in, triggering the inhaler at same time you start breathing in.  Hold for up to 5 seconds if you can. Blow out thru nose. Rinse and gargle with water when done   Plan B = Backup Only use your albuterol inhaler (proventil) as a rescue medication to be used if you can't catch your breath by resting or doing a relaxed purse lip breathing pattern.  - The less you use it, the better it will work when you need it. - Ok to use the inhaler up to 2 puffs  every 4 hours if you must but call for appointment if use goes up over your usual need - Don't leave home without it !!  (think of it like the spare tire for your car)    Plan C = Crisis - only use your albuterol nebulizer if you first try Plan B and it fails to help > ok to use the nebulizer up to every 4 hours but if start needing it regularly call for immediate appointment   Please schedule a follow up office visit in 2  weeks, call sooner if needed with all medications /inhalers/ solutions in hand so we can verify exactly what you are taking. This includes all medications from all doctors and over the counters.

## 2019-02-20 NOTE — Progress Notes (Signed)
Melissa [name], female    DOB: 02/23/1986,    MRN: [id number]   Brief patient profile:  22 yobf never smoker, born term, onset asthma per mom  w/in a month of birth  with chronic symptoms of doe and episodically worse worse esp  in fall   and eventually eval by [facility]e Nodal on shots x years rx clariton/singulair / advair better and able to play soccer as goalie  And do PE twice weekly but would almost always  use rescue whenever exercising last took shots senior in HS and went to college  [employer] with lots mold > had to come home p Freshman year "due to breathing"  And then  [facility]UP age 71 maintained on singulair and saba only and remained on neb avg twice weekly.  eval this office 09/22/15 with rec dulera 200 bid/ singulair / gerd rx and f/u in 2 weeks and did not return nor maintain on dulera (listed but never refilled p 08/2015 ) then admit 2 x in 2020 the second:     Admit date: 02/18/2019 Discharge date: 02/19/2019  Primary Care Provider: Dorothyann Peng, MD  Primary Cardiologist: Dietrich Pates, MD  Primary Electrophysiologist:  None   Discharge Diagnoses    Principal Problem:   Chest pain with high risk of acute coronary syndrome - vs. acute pericarditis Active Problems:   Severe persistent asthma with allergic rhinitis with status asthmaticus   ST elevation   Acute pericarditis   Chest pain   Allergies     Allergies  Allergen Reactions  . Ginger Anaphylaxis    Diagnostic Studies/Procedures    LEFT HEART CATH AND CORONARY ANGIOGRAPHY 02/18/19  Conclusion    LV end diastolic pressure is normal.  The left ventricular systolic function is normal.  There is no aortic valve stenosis.  The left ventricular ejection fraction is 55-65% by visual estimate.  SUMMARY  Angiographically normal coronary arteries  Normal LVEF and EDP. No regional wall motion normality on LV gram.  RECOMMENDATIONS  Admit to telemetry with presumed diagnosis of pericarditis.  Check  2D echocardiogram and inflammatory markers.  Start colchicine and ibuprofen.  Monitor closely for exacerbation of respiratory issues.  Bryan Lemma, MD  _____________  Echocardiogram 02/19/19 IMPRESSIONS 1. The left ventricle has normal systolic function with an ejection fraction of 60-65%. The cavity size was normal. Left ventricular diastolic parameters were normal No evidence of left ventricular regional wall motion abnormalities. 2. The right ventricle has normal systolic function. The cavity was normal. There is no increase in right ventricular wall thickness. 3. The mitral valve is normal in structure. 4. The tricuspid valve is normal in structure. 5. The aortic valve is normal in structure. 6. The pulmonic valve was normal in structure. 7. No evidence of left ventricular regional wall motion abnormalities.  FINDINGS Left Ventricle: The left ventricle has normal systolic function, with an ejection fraction of 60-65%. The cavity size was normal. There is no increase in left ventricular wall thickness. Left ventricular diastolic parameters were normal No evidence of  left ventricular regional wall motion abnormalities.. Right Ventricle: The right ventricle has normal systolic function. The cavity was normal. There is no increase in right ventricular wall thickness. Left Atrium: left atrial size was normal in size Right Atrium: right atrial size was normal in size. Interatrial Septum: No atrial level shunt detected by color flow Doppler. Pericardium: There is no evidence of pericardial effusion. Mitral Valve: The mitral valve is normal in structure. Mitral valve regurgitation  is trivial by color flow Doppler. Tricuspid Valve: The tricuspid valve is normal in structure. Tricuspid valve regurgitation was not visualized by color flow Doppler. Aortic Valve: The aortic valve is normal in structure. Aortic valve regurgitation was not visualized by color flow Doppler. Pulmonic  Valve: The pulmonic valve was normal in structure. Pulmonic valve regurgitation is not visualized by color flow Doppler. Venous: The inferior vena cava is normal in size with greater than 50% respiratory variability.    History of Present Illness     [name] Burtonis a 33 y.o.AAfemale, nonsmoker w/ history of asthma, recent hospitalization for acute hypoxic respiratory failure 2/2 asthma exacerbation and flu butwith no prior cardiac history, presenting as a CODE STEMI w/ active CP and inferolateral ST elevations.   She denies any family history of CAD. Family history is notable for HTN in mother and DM in paternal aunt. She is on inhalers for asthma but no other medications. She reports an allergy to ginger (respiratory distress/ anaphylaxis). There is also noted in her medical records a diagnosis of presumptive "stroke" but no records to confirm this.   She was admitted from 2/8-2/13 for acute hypoxic respiratory failure 2/2 acute asthma exacerbation and also tested + for influenza A. She was treated by IM. Got IV steroids and inhalers and asthma improved. She received Tamiflu for influenza.   She was discharged home on 2/13. Pt decided to go back to work today but felt that she went back too early. She has had CP x 2 days, but CP worsened today. Described as SS chest pressure/ tightness. No radiation. She notes mild dyspnea. Pt initially went to [facility] ED and was found to have inferolateral ST elevations. Pt was given 325 mg of ASA, 4,000 U heparin, 2SL NGT, placed on supplemental O2 and transferred to [facility] for emergent cardiac cath.    Hospital Course     Consultants: None  She was brought directly to cardaic catheterization lab 02/18/19. Initial attempt radial access was unsuccessful because of spasm. Swithced to femoral access. Groin angiography was performed showing no evidence of coronary disease. On the monitor, her inferior lateral ST elevations seem to have resolved and she was no  longer having chest pain. Note, 2 gm morphine relieved her pain.   LV gram showed relative normal EF with no regional wall motion normalities. Normal EDP. Echo showed normal LVEF with trivial pericardial effusion (may be normal).   Pt still has some pleuritic chest pain, mild, and some pressing feeling on her chest. Some of her pain seems to be musculoskeletal, also could have some vasospasm. Dr. Tenny Craw is not convinced of pericarditis (no rub, not positional). Some pleuritic pain may be related to recent flu, asthma. Prefer not to send home on colchicine. She can take ibuprofen. We will start amlodipine 2.5 mg for BP and possible coronary spasm.   Follow up EKG today shows no further ST elevation.   She has an appt with Dr. Sherene Sires tomorrow, pulmonology. She will continue on inhalers and prednisone taper.   She will follow up with Dr. Tenny Craw in the office.   Patient has been seen by Dr. Tenny Craw today and deemed ready for discharge home. All follow up appointments have been scheduled. Discharge medications are listed below.      History of Present Illness  02/20/2019  Pulmonary/ 1st office eval/Sultana Tierney  Chief Complaint  Patient presents with  . Pulmonary Consult    Referred by Dr. Isidoro Donning for eval of respiratory failure and asthma.  Pt just d/c'ed from the hospital 02/19/2019.  She c/o wheezing at night.  She has DOE. She has been using albuterol inhaler and neb daily.   Dyspnea:  Room to room  Cough: min mucoid  Sleep: 2 pillows  SABA use: as above   No obvious day to day or daytime variability or assoc  purulent sputum or mucus plugs or hemoptysis or  chest tightness, subjective wheeze or overt sinus or hb symptoms.     Also denies any obvious fluctuation of symptoms with weather or environmental changes or other aggravating or alleviating factors except as outlined above   No unusual exposure hx or h/o childhood pna  or knowledge of premature birth.  Current Allergies, Complete Past  Medical History, Past Surgical History, Family History, and Social History were reviewed in [facility] electronic medical record.  ROS  The following are not active complaints unless bolded Hoarseness, sore throat, dysphagia, dental problems, itching, sneezing,  nasal congestion or discharge of excess mucus or purulent secretions, ear ache,   fever, chills, sweats, unintended wt loss or wt gain, classically pleuritic or exertional cp,  orthopnea pnd or arm/hand swelling  or leg swelling, presyncope, palpitations, abdominal pain, anorexia, nausea, vomiting, diarrhea  or change in bowel habits or change in bladder habits, change in stools or change in urine, dysuria, hematuria,  rash, arthralgias, visual complaints, headache, numbness, weakness or ataxia or problems with walking s/p R fem access or coordination,  change in mood or  memory.              Past Medical History:  Diagnosis Date  . Abnormal Pap smear 2010   Repeat pap done;Hysteroscopy;Last pap 2012;was normal  . Anemia 2010   Iron supplements PP  . Asthma    Has a nebulizer;triggered by stress  . Colon polyps   . Headache(784.0)   . Heart attack [facility](HCC)    MD thinks may have had a heart attack;Cardiologist @ [facility]  . History of CVA (cerebrovascular accident) 04/09/2013   Pt reports presumptive "stroke" when she was 33y.o.; [facility] for "chronic nerve pain" and has been Rx'd Hydrocodone and Mobic  . Infection    BV;may get frequently  . Nerve pain    Chronic;d/t possible stroke and MI  . Placenta previa antepartum 2010   Placenta moved 3 days before baby was born  . Sickle cell trait (HCC)   . Stroke [facility](HCC) 2013   MD thought may have had a stroke and heart attack;has neurologist @ [facility]    Outpatient Medications Prior to Visit  Medication Sig Dispense Refill  . acetaminophen (TYLENOL) 500 MG tablet Take 500 mg by mouth every 6 (six) hours as needed.    [name]. albuterol (PROVENTIL  HFA;VENTOLIN HFA) 108 (90 BASE) MCG/ACT inhaler Inhale 2 puffs into the lungs every 4 (four) hours as needed for wheezing or shortness of breath. 1 Inhaler 12  . albuterol (PROVENTIL) (2.5 MG/3ML) 0.083% nebulizer solution Take 2.5 mg by nebulization every 4 (four) hours as needed for wheezing or shortness of breath.    [name]. amLODipine (NORVASC) 2.5 MG tablet Take 1 tablet (2.5 mg total) by mouth daily. 90 tablet 3  . benzonatate (TESSALON) 200 MG capsule Take 1 capsule (200 mg total) by mouth 3 (three) times daily as needed for cough. 30 capsule 0  . chlorpheniramine-HYDROcodone (TUSSIONEX) 10-8 MG/5ML SUER Take 5 mLs by mouth every 12 (twelve) hours as needed for cough. 115 mL 0  . etonogestrel (  NEXPLANON) 68 MG IMPL implant 1 each by Subdermal route once.    [name]. ibuprofen (ADVIL,MOTRIN) 200 MG tablet Take 200 mg by mouth every 6 (six) hours as needed.    . mometasone-formoterol (DULERA) 100-5 MCG/ACT AERO Inhale 2 puffs into the lungs 2 (two) times daily. 1 Inhaler 3  . montelukast (SINGULAIR) 10 MG tablet Take 1 tablet (10 mg total) by mouth at bedtime. 30 tablet 3  . predniSONE (DELTASONE) 10 MG tablet Prednisone dosing: Take  Prednisone 40mg  (4 tabs) x 3 days, then taper to 30mg  (3 tabs) x 3 days, then 20mg  (2 tabs) x 3days, then 10mg  (1 tab) x 3days, then OFF. (Patient taking differently: Take 10-40 mg by mouth See admin instructions. Prednisone dosing: Take  Prednisone 40mg  (4 tabs) x 3 days, then taper to 30mg  (3 tabs) x 3 days, then 20mg  (2 tabs) x 3days, then 10mg  (1 tab) x 3days, then OFF.) 30 tablet 0  . ibuprofen (ADVIL,MOTRIN) 600 MG tablet Take 600 mg by mouth every 6 (six) hours as needed for mild pain.         Objective:     BP 118/72 (BP Location: Left Arm, Cuff Size: Normal)   Pulse (!) 202   Ht 5\' 5"  (1.651 m)   Wt 202 lb (91.6 kg)   LMP 02/18/2019   SpO2 100%   BMI 33.61 kg/m   SpO2: 100 %   RA  W/c bound due to R fem arterry cath site pain   HEENT: nl dentition,  turbinates bilaterally, and oropharynx. Nl external ear canals without cough reflex   NECK :  without JVD/Nodes/TM/ nl carotid upstrokes bilaterally   LUNGS: no acc muscle use,  Nl contour chest which is clear to A and P bilaterally without cough on insp or exp maneuvers   CV:  RRR  no s3 or murmur or increase in P2, and no edema   ABD:  soft and nontender with nl inspiratory excursion in the supine position. No bruits or organomegaly appreciated, bowel sounds nl  MS:    ext warm without deformities, calf tenderness, cyanosis or clubbing No obvious joint restrictions/ R groin bandage dry/ no obvious sts/hematoma    SKIN: warm and dry without lesions    NEURO:  alert, approp, nl sensorium with  no motor or cerebellar deficits apparent.    Labs  reviewed:      Chemistry      Component Value Date/Time   NA 140 02/19/2019 0106   K 3.5 02/19/2019 0106   CL 109 02/19/2019 0106   CO2 22 02/19/2019 0106   BUN 10 02/19/2019 0106   CREATININE 0.85 02/19/2019 0106      Component Value Date/Time   CALCIUM 8.4 (L) 02/19/2019 0106   ALKPHOS 68 02/18/2019 1548   AST 36 02/18/2019 1548   ALT 67 (H) 02/18/2019 1548   BILITOT 0.8 02/18/2019 1548        Lab Results  Component Value Date   WBC 12.7 (H) 02/19/2019   HGB 10.3 (L) 02/19/2019   HCT 33.3 (L) 02/19/2019   MCV 82.2 02/19/2019   PLT 224 02/19/2019     Lab Results  Component Value Date   DDIMER 0.38 02/11/2019      Lab Results  Component Value Date   TSH 1.70 09/08/2015     Lab Results  Component Value Date   PROBNP 17.0 09/08/2015       Lab Results  Component Value Date   ESRSEDRATE 7  02/18/2019         I personally reviewed images and agree with radiology impression as follows:  CXR: 02/18/19 No active disease.         Assessment   Mild persistent chronic asthma without complication Spirometry 09/08/2015 wnl including fef 25-75 -  02/20/2019  After extensive coaching inhaler device,   effectiveness =    75% from a baseline of 25% > continue dulera 100 2bid/ singulair    DDX of  difficult airways management almost all start with A and  include Adherence, Ace Inhibitors, Acid Reflux, Active Sinus Disease, Alpha 1 Antitripsin deficiency, Anxiety masquerading as Airways dz,  ABPA,  Allergy(esp in young), Aspiration (esp in elderly), Adverse effects of meds,  Active smoking or vaping, A bunch of PE's (a small clot burden can't cause this syndrome unless there is already severe underlying pulm or vascular dz with poor reserve) plus two Bs  = Bronchiectasis and Beta blocker use..and one C= CHF   In this case Adherence is the biggest issue and starts with  inability to use HFA effectively and also  understand that SABA treats the symptoms but doesn't get to the underlying problem (inflammation).  I used  the analogy of putting steroid cream on a rash to help explain the meaning of topical therapy and the need to get the drug to the target tissue.   - see hfa teaching - return in 2 weeks with all meds in hand using a trust but verify approach to confirm accurate Medication  Reconciliation The principal here is that until we are certain that the  patients are doing what we've asked, it makes no sense to ask them to do more.    ? Allergy > recent flare due to Flu / seems like singulair helping as made it 4 years without returning to pulmonary clinic on what amts to singulair and saba so continue singulair consider allergy testing on return / in meantime finish pred and use ics consistently with freq rechecks    ? Anxiety/ depression > usually at the bottom of this list of usual suspects but should be  higher on this pt's based on H and P and note already on psychotropics and may interfere with adherence and also interpretation of response or lack thereof to symptom management which can be quite subjective.   ? A bunch of PE's >  D dimer nl - while a normal  or high normal value (seen  commonly in the elderly or chronically ill)  may miss small peripheral pe, the clot burden with sob is moderately high and the d dimer  has a very high neg pred value if used in this setting.    ? Acid (or non-acid) GERD > always difficult to exclude as up to 75% of pts in some series report no assoc GI/ Heartburn symptoms> rec consider trial of max (24h)  acid suppression and diet restrictions/ reviewed and instructions given in writing.   ? Cardiac component > ruled out at recent admit       Total time devoted to counseling  > 50 % of initial 60 min office visit:  review case with pt/mother/   device teaching which extended face to face time for this visit  discussion of options/alternatives/ personally creating written customized instructions  in presence of pt  then going over those specific  Instructions directly with the pt including how to use all of the meds but in particular covering each new medication in  detail and the difference between the maintenance= "automatic" meds and the prns using an action plan format for the latter (If this problem/symptom => do that organization reading Left to right).  Please see AVS from this visit for a full list of these instructions which I personally wrote for this pt and  are unique to this visit.          Sandrea Hughs, MD 02/20/2019

## 2019-02-21 ENCOUNTER — Encounter: Payer: Self-pay | Admitting: Internal Medicine

## 2019-02-21 NOTE — Assessment & Plan Note (Addendum)
Spirometry 09/08/2015 wnl including fef 25-75 -  02/20/2019  After extensive coaching inhaler device,  effectiveness =    75% from a baseline of 25% > continue dulera 100 2bid/ singulair    DDX of  difficult airways management almost all start with A and  include Adherence, Ace Inhibitors, Acid Reflux, Active Sinus Disease, Alpha 1 Antitripsin deficiency, Anxiety masquerading as Airways dz,  ABPA,  Allergy(esp in young), Aspiration (esp in elderly), Adverse effects of meds,  Active smoking or vaping, A bunch of PE's (a small clot burden can't cause this syndrome unless there is already severe underlying pulm or vascular dz with poor reserve) plus two Bs  = Bronchiectasis and Beta blocker use..and one C= CHF   In this case Adherence is the biggest issue and starts with  inability to use HFA effectively and also  understand that SABA treats the symptoms but doesn't get to the underlying problem (inflammation).  I used  the analogy of putting steroid cream on a rash to help explain the meaning of topical therapy and the need to get the drug to the target tissue.   - see hfa teaching - return in 2 weeks with all meds in hand using a trust but verify approach to confirm accurate Medication  Reconciliation The principal here is that until we are certain that the  patients are doing what we've asked, it makes no sense to ask them to do more.    ? Allergy > recent flare due to Flu / seems like singulair helping as made it 4 years without returning to pulmonary clinic on what amts to singulair and saba so continue singulair consider allergy testing on return / in meantime finish pred and use ics consistently with freq rechecks    ? Anxiety/ depression > usually at the bottom of this list of usual suspects but should be  higher on this pt's based on H and P and note already on psychotropics and may interfere with adherence and also interpretation of response or lack thereof to symptom management which can be quite  subjective.   ? A bunch of PE's >  D dimer nl - while a normal  or high normal value (seen commonly in the elderly or chronically ill)  may miss small peripheral pe, the clot burden with sob is moderately high and the d dimer  has a very high neg pred value if used in this setting.    ? Acid (or non-acid) GERD > always difficult to exclude as up to 75% of pts in some series report no assoc GI/ Heartburn symptoms> rec consider trial of max (24h)  acid suppression and diet restrictions/ reviewed and instructions given in writing.   ? Cardiac component > ruled out at recent admit     Total time devoted to counseling  > 50 % of initial 60 min office visit:  review case with pt/mother/   device teaching which extended face to face time for this visit  discussion of options/alternatives/ personally creating written customized instructions  in presence of pt  then going over those specific  Instructions directly with the pt including how to use all of the meds but in particular covering each new medication in detail and the difference between the maintenance= "automatic" meds and the prns using an action plan format for the latter (If this problem/symptom => do that organization reading Left to right).  Please see AVS from this visit for a full list of these instructions which  I personally wrote for this pt and  are unique to this visit.

## 2019-03-05 ENCOUNTER — Ambulatory Visit: Payer: BLUE CROSS/BLUE SHIELD | Admitting: Internal Medicine

## 2019-03-05 ENCOUNTER — Encounter: Payer: Self-pay | Admitting: Internal Medicine

## 2019-03-05 ENCOUNTER — Other Ambulatory Visit: Payer: Self-pay

## 2019-03-05 VITALS — BP 140/80 | HR 100 | Temp 98.0°F | Ht 67.8 in | Wt 194.8 lb

## 2019-03-05 DIAGNOSIS — Z833 Family history of diabetes mellitus: Secondary | ICD-10-CM

## 2019-03-05 DIAGNOSIS — G8929 Other chronic pain: Secondary | ICD-10-CM | POA: Insufficient documentation

## 2019-03-05 DIAGNOSIS — O99345 Other mental disorders complicating the puerperium: Secondary | ICD-10-CM

## 2019-03-05 DIAGNOSIS — R1031 Right lower quadrant pain: Secondary | ICD-10-CM | POA: Diagnosis not present

## 2019-03-05 DIAGNOSIS — I10 Essential (primary) hypertension: Secondary | ICD-10-CM | POA: Diagnosis not present

## 2019-03-05 DIAGNOSIS — Z131 Encounter for screening for diabetes mellitus: Secondary | ICD-10-CM

## 2019-03-05 DIAGNOSIS — R059 Cough, unspecified: Secondary | ICD-10-CM

## 2019-03-05 DIAGNOSIS — F53 Postpartum depression: Secondary | ICD-10-CM | POA: Insufficient documentation

## 2019-03-05 DIAGNOSIS — R05 Cough: Secondary | ICD-10-CM | POA: Diagnosis not present

## 2019-03-05 DIAGNOSIS — M940 Chondrocostal junction syndrome [Tietze]: Secondary | ICD-10-CM | POA: Diagnosis not present

## 2019-03-05 DIAGNOSIS — R7309 Other abnormal glucose: Secondary | ICD-10-CM

## 2019-03-05 LAB — HEMOGLOBIN A1C
ESTIMATED AVERAGE GLUCOSE: 114 mg/dL
Hgb A1c MFr Bld: 5.6 % (ref 4.8–5.6)

## 2019-03-05 NOTE — Progress Notes (Signed)
Subjective:     Patient ID: Melissa [name] , female    DOB: Oct 30, 1986 , 33 y.o.   MRN: [id number]   Chief Complaint  Patient presents with  . New Patient (Initial Visit)    heart cath site-recent visit at hospital    HPI  Pt is here to re-establish care with the practice. Has been gone the past 5 years due to having babies. Has total of 3 kids, her last one 5y.  Her pap's and breast xms are up to date.   Wants to know if OK to continue working this week. 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 Her cardiologist released her to go back to work this week and yesterday she was struggling to finish her shift due to fatigue and chest soreness. She still has a dresing from the heart cath that she was not told when she could remove it.   Past Medical History:  Diagnosis Date  . Abnormal Pap smear 2010   Repeat pap done;Hysteroscopy;Last pap 2012;was normal  . Anemia 2010   Iron supplements PP  . Asthma    Has a nebulizer;triggered by stress  . Colon polyps   . Headache(784.0)   . Heart attack [facility])    MD thinks may have had a heart attack;Cardiologist @ [facility]  . History of CVA (cerebrovascular accident) 04/09/2013   Pt reports presumptive "stroke" when she was 33y.o.; followed by [facility] neurology for "chronic nerve pain" and has been Rx'd Hydrocodone and Mobic  . Infection    BV;may get frequently  . Nerve pain    Chronic;d/t possible stroke and MI  . Placenta previa antepartum 2010   Placenta moved 3 days before baby was born  . Sickle cell trait (HCC)   . Stroke [facility]) 2013   MD thought may have had a stroke and heart attack;has neurologist @ [facility]     Family History  Problem Relation Age of Onset  . Sickle cell trait Father   . Hypertension Mother   . Anemia Mother   . Asthma Mother   . Cervical cancer Paternal Aunt   . Cervical cancer Paternal Grandmother   . Diabetes Paternal Grandmother   . Diabetes Paternal Grandfather   . Diabetes Paternal Aunt   .  Asthma Brother   . Asthma Daughter   . Asthma Son      Current Outpatient Medications:  .  acetaminophen (TYLENOL) 500 MG tablet, Take 500 mg by mouth every 6 (six) hours as needed., Disp: , Rfl:  .  albuterol (PROVENTIL HFA;VENTOLIN HFA) 108 (90 BASE) MCG/ACT inhaler, Inhale 2 puffs into the lungs every 4 (four) hours as needed for wheezing or shortness of breath., Disp: 1 Inhaler, Rfl: 12 .  albuterol (PROVENTIL) (2.5 MG/3ML) 0.083% nebulizer solution, Take 2.5 mg by nebulization every 4 (four) hours as needed for wheezing or shortness of breath., Disp: , Rfl:  .  amLODipine (NORVASC) 2.5 MG tablet, Take 1 tablet (2.5 mg total) by mouth daily., Disp: 90 tablet, Rfl: 3 .  chlorpheniramine-HYDROcodone (TUSSIONEX) 10-8 MG/5ML SUER, Take 5 mLs by mouth every 12 (twelve) hours as needed for cough., Disp: 115 mL, Rfl: 0 .  etonogestrel (NEXPLANON) 68 MG IMPL implant, 1 each by Subdermal route once., Disp: , Rfl:  .  ibuprofen (ADVIL,MOTRIN) 200 MG tablet, Take 200 mg by mouth every 6 (six) hours as needed., Disp: , Rfl:  .  mometasone-formoterol (DULERA) 100-5 MCG/ACT AERO, Inhale 2 puffs into the lungs 2 (two) times daily.,  Disp: 1 Inhaler, Rfl: 3 .  montelukast (SINGULAIR) 10 MG tablet, Take 1 tablet (10 mg total) by mouth at bedtime., Disp: 30 tablet, Rfl: 3   Allergies  Allergen Reactions  . Ginger Anaphylaxis     Review of Systems  Constitutional: Negative for chills, diaphoresis and fever.       Has lost her appetite  Respiratory: Positive for cough and wheezing.        Non productive and causes pain in her chest and this started from Flu A and been in the hospital for this the entire month of Feb. Has apt with pulmonologist tomorrow. Only has mild wheezing.   Cardiovascular: Positive for chest pain and leg swelling. Negative for palpitations.       Has had sharp chest pain off and on which lasted a few seconds, but no N/V, sweating or radiation of pain occurred. This was while working.  Has had mild leg swelling off and on. Had heart cath 2/19 and was neg. Told she possibly has coronary spasms by cardiologist.   Endocrine: Positive for polydipsia.  Genitourinary: Negative for dysuria, frequency and urgency.  Skin: Negative for rash and wound.    Today's Vitals   03/05/19 0905  BP: 140/80  Pulse: 100  Temp: 98 F (36.7 C)  TempSrc: Oral  SpO2: 98%  Weight: 194 lb 12.8 oz (88.4 kg)  Height: 5' 7.8" (1.722 m)   Body mass index is 29.79 kg/m.   Objective:  Physical Exam Vitals signs and nursing note reviewed.  Constitutional:      General: She is not in acute distress.    Appearance: Normal appearance. She is not toxic-appearing.     Comments: She walks slow as her R groin is still sore  HENT:     Head: Normocephalic.     Right Ear: External ear normal.     Left Ear: External ear normal.     Nose: Nose normal.  Eyes:     General: No scleral icterus.    Conjunctiva/sclera: Conjunctivae normal.  Neck:     Musculoskeletal: Normal range of motion and neck supple. No neck rigidity.  Cardiovascular:     Rate and Rhythm: Normal rate and regular rhythm.     Pulses: Normal pulses.     Heart sounds: No murmur.  Pulmonary:     Effort: Pulmonary effort is normal.     Breath sounds: Normal breath sounds.     Comments: Has chest soreness on L mid rib care region.  Chest:     Chest wall: Tenderness present.  Musculoskeletal: Normal range of motion.  Lymphadenopathy:     Cervical: No cervical adenopathy.  Skin:    General: Skin is warm and dry.     Findings: No bruising or rash.  Neurological:     Mental Status: She is alert and oriented to person, place, and time.  Psychiatric:        Mood and Affect: Mood normal.        Behavior: Behavior normal.        Thought Content: Thought content normal.        Judgment: Judgment normal.     Constitutional: She is oriented to person, place, and time. She appears well-developed and well-nourished. No distress.  HENT:   Head: Normocephalic and atraumatic.  Right Ear: External ear normal.  Left Ear: External ear normal.  Nose: Nose normal.  Eyes: Conjunctivae are normal. Right eye exhibits no discharge. Left eye exhibits no discharge. No  scleral icterus.  Neck: Neck supple. No thyromegaly present.  No carotid bruits bilaterally  Cardiovascular: Normal rate and regular rhythm.  No murmur heard. Pulmonary/Chest: Effort normal and breath sounds normal. No respiratory distress.  Musculoskeletal: Normal range of motion. She exhibits no edema.  Lymphadenopathy: She has no cervical adenopathy. R groin area still had gauze and Tegaderm which I removed and the area is fully healed from the heart cath. There is no redness, swelling or nodes.  Neurological: She is alert and oriented to person, place, and time.  Skin: Skin is warm and dry. Capillary refill takes less than 2 seconds. No rash noted. She is not diaphoretic.  Psychiatric: She has a normal mood and affect. Her behavior is normal. Judgment and thought content normal.  Nursing note reviewed.  Assessment And Plan:    1. Abnormal glucose- unknown status.  - Hemoglobin A1c  2. Essential hypertension- controlled.   3. Cough- under treatment for pulmonologist. She asked what she could use for day time cough since Tussinex makes her sedated, I suggested to try Delsym, and avoid dairy since this causes increase mucous secretions. She was given a note stating she could not work more than 8h per day. FU in 2 weeks  To see if she can be released to regular duty.  4- R groin pain- would has fully healed well. Keep apt with cardiologist on 3/23.  She left me FMLA paper work she would like me to fill out for her. 5- Costochondritis- mild, more likely from doing all the coughing she has been doing. No action.  Fu in 2 weeks at which time I will check how she is enduring her 8 h shift and see if she can be released to go to full work hours and over time if she has to  work more. I will also work on filling out her FMLA paper work.     Mazikeen Hehn RODRIGUEZ-SOUTHWORTH, PA-C

## 2019-03-05 NOTE — Assessment & Plan Note (Signed)
L side of body from CVA

## 2019-03-06 ENCOUNTER — Ambulatory Visit: Payer: BLUE CROSS/BLUE SHIELD | Admitting: Internal Medicine

## 2019-03-06 ENCOUNTER — Encounter: Payer: Self-pay | Admitting: Primary Care

## 2019-03-06 ENCOUNTER — Ambulatory Visit: Payer: BLUE CROSS/BLUE SHIELD | Admitting: Primary Care

## 2019-03-06 VITALS — BP 116/70 | HR 80 | Ht 65.0 in | Wt 196.0 lb

## 2019-03-06 DIAGNOSIS — J4530 Mild persistent asthma, uncomplicated: Secondary | ICD-10-CM

## 2019-03-06 DIAGNOSIS — J4552 Severe persistent asthma with status asthmaticus: Secondary | ICD-10-CM | POA: Diagnosis not present

## 2019-03-06 MED ORDER — SPACER/AERO-HOLDING CHAMBERS DEVI: 1.0000 | 0 refills | Status: DC

## 2019-03-06 MED ORDER — MOMETASONE FURO-FORMOTEROL FUM 200-5 MCG/ACT IN AERO: 2.0000 | INHALATION_SPRAY | Freq: Every day | RESPIRATORY_TRACT | 0 refills | Status: DC

## 2019-03-06 NOTE — Patient Instructions (Addendum)
Needs spacer  Trial increase Dulera 200- take 2 puffs twice daily (sample given)  Respiratory allergy panel today  Add omeprazole 20mg  daily until next follow-up visit (you can get this over the counter)  Follow up with Dr. Sherene Sires in 6 weeks

## 2019-03-06 NOTE — Assessment & Plan Note (Deleted)
Reports chest pain with deep breath, wheezing and cough  Unable to complete FENO Trial increase Dulera 200- take 2 puffs twice daily (sample given) Respiratory allergy panel today Add daily PPI  FU in 6 weeks

## 2019-03-06 NOTE — Progress Notes (Signed)
@Patient  ID: Melissa [name], female    DOB: [birth year], 33 y.o.   MRN: [id number]  Chief Complaint  Patient presents with  . Follow-up    breathing varies; states "some days are good/some days bad"; on [place], works well for her    Referring provider: Dorothyann Peng, MD  HPI: 33 year old female, never smoked. PMH significant for severe persistent asthma. Recent hospital admission from 2/8-2/13 for acute hypoxic respiratory failure secondary to acute asthma exacerbation and +influenza A. Presented back to [facility] ED for CP x 2 days, found to have inferolateral ST elevation. Cath on 2/19 showed no evidence of CAD. Ddimer neg.   Patient of Dr. Sherene Sires, last seen new consult on 02/20/19. Complains of wheezing at night and DOE. Spirometry in 2016 wnl. Recent asthma flare d/t influenza. Continues Dulera 100 2bid and Singulair. May need allergy testing. Instructed to finish prednisone and use ICS inhaler consistently with freq rechecks.   03/06/2019 Patient presents today for 2 week follow-up. Accompanied by her young son. Doing alright. Returned to work this week, states that she is very tired. Still has cough which is occasionally productive. Breathing is stable but reports shallow slow breathing and pain with a deep breath. Occasional wheeze. Takes Dulera 5:30am and 5:30 pm. Hasn't had to use rescue inhaler. Will see cardiology on 2/23. Unable to do FENO today.   Allergies  Allergen Reactions  . Ginger Anaphylaxis    Immunization History  Administered Date(s) Administered  . Influenza,inj,Quad PF,6+ Mos 10/20/2013, 11/30/2014  . Pneumococcal Polysaccharide-23 10/30/2013    Past Medical History:  Diagnosis Date  . Abnormal Pap smear 2010   Repeat pap done;Hysteroscopy;Last pap 2012;was normal  . Anemia 2010   Iron supplements PP  . Asthma    Has a nebulizer;triggered by stress  . Colon polyps   . Headache(784.0)   . Heart attack [facility])    MD thinks may have had a heart attack;Cardiologist @  [facility]  . History of CVA (cerebrovascular accident) 04/09/2013   Pt reports presumptive "stroke" when she was 33y.o.; followed by [facility] neurology for "chronic nerve pain" and has been Rx'd Hydrocodone and Mobic  . Infection    BV;may get frequently  . Nerve pain    Chronic;d/t possible stroke and MI  . Placenta previa antepartum 2010   Placenta moved 3 days before baby was born  . Sickle cell trait (HCC)   . Stroke [facility]) 2013   MD thought may have had a stroke and heart attack;has neurologist @ [facility]    Tobacco History: Social History   Tobacco Use  Smoking Status Never Smoker  Smokeless Tobacco Never Used   Counseling given: Not Answered   Outpatient Medications Prior to Visit  Medication Sig Dispense Refill  . acetaminophen (TYLENOL) 500 MG tablet Take 500 mg by mouth every 6 (six) hours as needed.    [name] albuterol (PROVENTIL HFA;VENTOLIN HFA) 108 (90 BASE) MCG/ACT inhaler Inhale 2 puffs into the lungs every 4 (four) hours as needed for wheezing or shortness of breath. 1 Inhaler 12  . albuterol (PROVENTIL) (2.5 MG/3ML) 0.083% nebulizer solution Take 2.5 mg by nebulization every 4 (four) hours as needed for wheezing or shortness of breath.    [name] amLODipine (NORVASC) 2.5 MG tablet Take 1 tablet (2.5 mg total) by mouth daily. 90 tablet 3  . chlorpheniramine-HYDROcodone (TUSSIONEX) 10-8 MG/5ML SUER Take 5 mLs by mouth every 12 (twelve) hours as needed for cough. 115 mL 0  . etonogestrel (NEXPLANON)  68 MG IMPL implant 1 each by Subdermal route once.    [name] ibuprofen (ADVIL,MOTRIN) 200 MG tablet Take 200 mg by mouth every 6 (six) hours as needed.    . montelukast (SINGULAIR) 10 MG tablet Take 1 tablet (10 mg total) by mouth at bedtime. 30 tablet 3  . mometasone-formoterol (DULERA) 100-5 MCG/ACT AERO Inhale 2 puffs into the lungs 2 (two) times daily. 1 Inhaler 3   No facility-administered medications prior to visit.     Review of Systems  Review of Systems    Constitutional: Negative.   Respiratory: Positive for cough, chest tightness, shortness of breath and wheezing.   Cardiovascular: Negative.     Physical Exam  BP 116/70 (BP Location: Left Arm, Cuff Size: Normal)   Pulse 80   Ht 5\' 5"  (1.651 m)   Wt 196 lb (88.9 kg)   LMP 02/18/2019   SpO2 98%   BMI 32.62 kg/m  Physical Exam Constitutional:      Appearance: Normal appearance.  HENT:     Right Ear: Tympanic membrane normal.     Left Ear: Tympanic membrane normal.     Mouth/Throat:     Mouth: Mucous membranes are moist.     Pharynx: Oropharynx is clear.  Eyes:     Extraocular Movements: Extraocular movements intact.     Conjunctiva/sclera: Conjunctivae normal.     Pupils: Pupils are equal, round, and reactive to light.  Neck:     Musculoskeletal: Normal range of motion and neck supple.  Cardiovascular:     Rate and Rhythm: Normal rate and regular rhythm.  Pulmonary:     Effort: Pulmonary effort is normal.     Breath sounds: No wheezing or rhonchi.  Musculoskeletal: Normal range of motion.  Skin:    General: Skin is warm and dry.  Neurological:     General: No focal deficit present.     Mental Status: She is alert and oriented to person, place, and time. Mental status is at baseline.  Psychiatric:        Mood and Affect: Mood normal.        Behavior: Behavior normal.        Thought Content: Thought content normal.        Judgment: Judgment normal.      Lab Results:  CBC    Component Value Date/Time   WBC 12.7 (H) 02/19/2019 0106   RBC 4.05 02/19/2019 0106   HGB 10.3 (L) 02/19/2019 0106   HCT 33.3 (L) 02/19/2019 0106   PLT 224 02/19/2019 0106   MCV 82.2 02/19/2019 0106   MCH 25.4 (L) 02/19/2019 0106   MCHC 30.9 02/19/2019 0106   RDW 14.7 02/19/2019 0106   LYMPHSABS 1.8 02/18/2019 1548   MONOABS 0.4 02/18/2019 1548   EOSABS 0.0 02/18/2019 1548   BASOSABS 0.0 02/18/2019 1548    BMET    Component Value Date/Time   NA 140 02/19/2019 0106   K 3.5  02/19/2019 0106   CL 109 02/19/2019 0106   CO2 22 02/19/2019 0106   GLUCOSE 130 (H) 02/19/2019 0106   BUN 10 02/19/2019 0106   CREATININE 0.85 02/19/2019 0106   CALCIUM 8.4 (L) 02/19/2019 0106   GFRNONAA >60 02/19/2019 0106   GFRAA >60 02/19/2019 0106    BNP    Component Value Date/Time   BNP 16.4 02/11/2019 1536    ProBNP    Component Value Date/Time   PROBNP 17.0 09/08/2015 1045    Imaging: Dg Chest 2 View  Result Date: 02/07/2019 CLINICAL DATA:  Shortness of breath with cough and congestion EXAM: CHEST - 2 VIEW COMPARISON:  September 22, 2015 FINDINGS: There is no edema or consolidation. The heart size and pulmonary vascularity are normal. No adenopathy. No bone lesions. IMPRESSION: No edema or consolidation. Electronically Signed   By: Bretta Bang III M.D.   On: 02/07/2019 10:14   Dg Chest Port 1 View  Result Date: 02/18/2019 CLINICAL DATA:  Chest pain x1 day. EXAM: PORTABLE CHEST 1 VIEW COMPARISON:  02/07/2019 FINDINGS: The heart size and mediastinal contours are within normal limits. Both lungs are clear. The visualized skeletal structures are unremarkable. IMPRESSION: No active disease. Electronically Signed   By: Tollie Eth M.D.   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

## 2019-03-06 NOTE — Assessment & Plan Note (Addendum)
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

## 2019-03-09 ENCOUNTER — Telehealth: Payer: Self-pay | Admitting: Primary Care

## 2019-03-09 LAB — RESPIRATORY ALLERGY PROFILE REGION II ~~LOC~~
Allergen, A. alternata, m6: <0.1 kU/L
Allergen, Cedar tree, t12: <0.1 kU/L
Allergen, Cottonwood, t14: <0.1 kU/L
Allergen, D pternoyssinus,d7: <0.1 kU/L
Allergen, Mouse Urine Protein, e78: <0.1 kU/L
Allergen, Mulberry, t76: <0.1 kU/L
Allergen, Oak,t7: <0.1 kU/L
Allergen, P. notatum, m1: <0.1 kU/L
Bermuda Grass: <0.1 kU/L
Box Elder IgE: <0.1 kU/L
CLASS: 0
CLASS: 0
CLASS: 0
COMMON RAGWEED (SHORT) (W1) IGE: <0.1 kU/L
Cat Dander: <0.1 kU/L
Class: 0
Class: 0
Class: 0
Class: 0
Class: 0
Class: 0
Class: 0
Class: 0
Class: 0
Class: 0
Class: 0
Class: 0
Class: 0
Class: 0
Class: 0
Class: 0
Class: 0
Class: 0
Class: 0
Class: 0
Class: 0
Cockroach: <0.1 kU/L
D. farinae: <0.1 kU/L
Dog Dander: <0.1 kU/L
Elm IgE: <0.1 kU/L
IgE (Immunoglobulin E), Serum: 25 kU/L (ref <=114)
Johnson Grass: <0.1 kU/L
Sheep Sorrel IgE: <0.1 kU/L
Timothy Grass: <0.1 kU/L

## 2019-03-09 LAB — INTERPRETATION:

## 2019-03-09 LAB — EXTRA LAV TOP TUBE

## 2019-03-09 NOTE — Telephone Encounter (Signed)
Called patient, unable to reach left message to give us a call back. 

## 2019-03-10 NOTE — Telephone Encounter (Signed)
Looked in the lab tab and saw that Salvadore Oxford was able to reach pt yesterday, 03/09/2019 to give her the results of the labwork. Nothing further needed.

## 2019-03-10 NOTE — Telephone Encounter (Signed)
Pt is returning call. Cb is [phone].

## 2019-03-10 NOTE — Telephone Encounter (Signed)
Called patient, unable to reach left message to give us a call back. 

## 2019-03-11 ENCOUNTER — Encounter: Payer: Self-pay | Admitting: Internal Medicine

## 2019-03-15 ENCOUNTER — Encounter: Payer: Self-pay | Admitting: Internal Medicine

## 2019-03-18 ENCOUNTER — Telehealth: Payer: Self-pay | Admitting: Internal Medicine

## 2019-03-18 NOTE — Telephone Encounter (Signed)
Left VM If pt is doing OK would recomm rescheduling appt for May due to concerns for corona virus Would recomm call back to reschedule

## 2019-03-18 NOTE — Telephone Encounter (Signed)
Called pt to cancel appt per Dr. Tenny Craw d/t COVID19  Pt reports continued chest pain that occurs daily at rest and on exercetion, pt states she has been alternating between Tylenol and Ibuprofen for that pain  Pt has appt with PCP tomorrow 3/19 Dr. Sherlon Handing at [facility]  Pts original 3/23 appt is being left on the schedule at this time

## 2019-03-19 ENCOUNTER — Encounter: Payer: Self-pay | Admitting: Internal Medicine

## 2019-03-19 ENCOUNTER — Other Ambulatory Visit: Payer: Self-pay

## 2019-03-19 ENCOUNTER — Ambulatory Visit: Payer: BLUE CROSS/BLUE SHIELD | Admitting: Internal Medicine

## 2019-03-19 VITALS — BP 110/60 | HR 88 | Temp 98.4°F | Ht 65.0 in | Wt 195.0 lb

## 2019-03-19 DIAGNOSIS — M940 Chondrocostal junction syndrome [Tietze]: Secondary | ICD-10-CM | POA: Diagnosis not present

## 2019-03-19 DIAGNOSIS — J4552 Severe persistent asthma with status asthmaticus: Secondary | ICD-10-CM

## 2019-03-19 NOTE — Progress Notes (Signed)
Subjective:     Patient ID: Melissa [name] , female    DOB: December 26, 1986 , 33 y.o.   MRN: [id number]   Chief Complaint  Patient presents with  . 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 She also needs me to finish filling out FMLA paper work. She was still struggling to work due to fatigue, still cant lift up her kids due to chest pain. Had to work 11 days straight due to an employee calling in sick. She tried working 8 hour shifts but could barely make it to the end of her shift. She is still coughing and tried the delsym. Continues to follow up with her pulmonologist and will see her cardiologist next week for Fu heart cath.   Past Medical History:  Diagnosis Date  . Abnormal Pap smear 2010   Repeat pap done;Hysteroscopy;Last pap 2012;was normal  . Anemia 2010   Iron supplements PP  . Asthma    Has a nebulizer;triggered by stress  . Colon polyps   . Headache(784.0)   . Heart attack [facility])    MD thinks may have had a heart attack;Cardiologist @ [facility]  . History of CVA (cerebrovascular accident) 04/09/2013   Pt reports presumptive "stroke" when she was 33y.o.; followed by [facility] neurology for "chronic nerve pain" and has been Rx'd Hydrocodone and Mobic  . Infection    BV;may get frequently  . Nerve pain    Chronic;d/t possible stroke and MI  . Placenta previa antepartum 2010   Placenta moved 3 days before baby was born  . Sickle cell trait (HCC)   . Stroke [facility]) 2013   MD thought may have had a stroke and heart attack;has neurologist @ [facility]     Family History  Problem Relation Age of Onset  . Sickle cell trait Father   . Hypertension Mother   . Anemia Mother   . Asthma Mother   . Cervical cancer Paternal Aunt   . Cervical cancer Paternal Grandmother   . Diabetes Paternal Grandmother   . Diabetes Paternal Grandfather   . Diabetes Paternal Aunt   .  Asthma Brother   . Asthma Daughter   . Asthma Son      Current Outpatient Medications:  .  acetaminophen (TYLENOL) 500 MG tablet, Take 500 mg by mouth every 6 (six) hours as needed., Disp: , Rfl:  .  albuterol (PROVENTIL HFA;VENTOLIN HFA) 108 (90 BASE) MCG/ACT inhaler, Inhale 2 puffs into the lungs every 4 (four) hours as needed for wheezing or shortness of breath., Disp: 1 Inhaler, Rfl: 12 .  albuterol (PROVENTIL) (2.5 MG/3ML) 0.083% nebulizer solution, Take 2.5 mg by nebulization every 4 (four) hours as needed for wheezing or shortness of breath., Disp: , Rfl:  .  amLODipine (NORVASC) 2.5 MG tablet, Take 1 tablet (2.5 mg total) by mouth daily., Disp: 90 tablet, Rfl: 3 .  chlorpheniramine-HYDROcodone (TUSSIONEX) 10-8 MG/5ML SUER, Take 5 mLs by mouth every 12 (twelve) hours as needed for cough., Disp: 115 mL, Rfl: 0 .  etonogestrel (NEXPLANON) 68 MG IMPL implant, 1 each by Subdermal route once., Disp: , Rfl:  .  ibuprofen (ADVIL,MOTRIN) 200 MG tablet, Take 200 mg by mouth every 6 (six) hours as needed., Disp: , Rfl:  .  mometasone-formoterol (DULERA) 200-5 MCG/ACT AERO, Inhale 2 puffs into the lungs daily., Disp: 1  Inhaler, Rfl: 0 .  montelukast (SINGULAIR) 10 MG tablet, Take 1 tablet (10 mg total) by mouth at bedtime., Disp: 30 tablet, Rfl: 3 .  Spacer/Aero-Holding Chambers DEVI, 1 Device by Does not apply route as directed., Disp: 1 each, Rfl: 0   Allergies  Allergen Reactions  . Ginger Anaphylaxis     Review of Systems  Constitutional: Positive for fatigue. Negative for activity change, chills, diaphoresis and fever.  HENT: Negative for congestion, postnasal drip and rhinorrhea.   Respiratory: Positive for cough, shortness of breath and wheezing.        Has chest soreness still  Cardiovascular: Negative for chest pain.  Gastrointestinal: Negative for abdominal pain.  Musculoskeletal: Negative for myalgias.  Hematological: Negative for adenopathy.     Today's Vitals   03/19/19  0913  BP: 110/60  Pulse: 88  Temp: 98.4 F (36.9 C)  TempSrc: Oral  SpO2: 99%  Weight: 195 lb (88.5 kg)  Height: 5\' 5"  (1.651 m)   Body mass index is 32.45 kg/m.   Objective:  Physical Exam Vitals signs and nursing note reviewed.  Constitutional:      General: She is not in acute distress.    Appearance: She is not ill-appearing, toxic-appearing or diaphoretic.  HENT:     Head: Normocephalic.     Right Ear: External ear normal.     Left Ear: External ear normal.     Nose: Nose normal.  Eyes:     General: No scleral icterus.    Conjunctiva/sclera: Conjunctivae normal.  Neck:     Musculoskeletal: Neck supple.  Cardiovascular:     Rate and Rhythm: Normal rate and regular rhythm.     Heart sounds: No murmur.  Pulmonary:     Effort: Pulmonary effort is normal.     Breath sounds: Normal breath sounds. No wheezing, rhonchi or rales.  Musculoskeletal: Normal range of motion.  Lymphadenopathy:     Cervical: No cervical adenopathy.  Skin:    General: Skin is warm and dry.  Neurological:     Mental Status: She is alert and oriented to person, place, and time.     Gait: Gait normal.  Psychiatric:        Mood and Affect: Mood normal.        Behavior: Behavior normal.        Thought Content: Thought content normal.        Judgment: Judgment normal.     Assessment And Plan:    1. Costochondritis- unchanged. May continue current treatment and follow with her cardiologist.   2. Severe persistent asthma with allergic rhinitis with status asthmaticus- without current wheezing as of today, but SOB and easily fatigue with activity not improving to be able to work full shifts. I reduced her hours to 6 h per shift, and needs a day off in between. I will have her try this for 3 weeks.  I filled out her FMLA form in the room with her asistance to take to her employer. Fu in 3 weeks.     Blade Scheff RODRIGUEZ-SOUTHWORTH, PA-C

## 2019-03-19 NOTE — Progress Notes (Signed)
Chart and office note reviewed in detail  > agree with a/p as outlined    

## 2019-03-22 ENCOUNTER — Telehealth: Payer: Self-pay | Admitting: Internal Medicine

## 2019-03-22 NOTE — Telephone Encounter (Signed)
Pt seen by IM last week   Felt to have costochondritis and asthma I would recomm cancelling appt for tomorrow Agree with recomm of IM

## 2019-03-23 ENCOUNTER — Ambulatory Visit: Payer: BLUE CROSS/BLUE SHIELD | Admitting: Internal Medicine

## 2019-03-23 ENCOUNTER — Telehealth: Payer: Self-pay | Admitting: Internal Medicine

## 2019-03-23 MED ORDER — COLCHICINE 0.6 MG PO TABS: 0.3000 mg | ORAL_TABLET | Freq: Two times a day (BID) | ORAL | 1 refills | Status: DC

## 2019-03-23 NOTE — Addendum Note (Signed)
Addended by: Lendon Ka on: 03/23/2019 10:45 AM   Modules accepted: Orders

## 2019-03-23 NOTE — Telephone Encounter (Signed)
I spoke with pt    Has appt today for reevaluation of CP She was admitted in Feb   Transient ST elevation   Cath with normal coronary arteries.  Echo normal Pt sent home on amlodipine for possible spasm  She is followed by Jerilee Hoh (asthma) and IM   Just seen on 3/19    Still ha CP  Worse if lies on L side    Some pleuritic and if press  On NSAIDs, inhaler and amlodipine    Although I did not think represented pericarditis when I saw her I would recomm a trial of colchicine 0.3 mg (1/2 of 0.6 ) 2x per day Watch for loose BM.  Stop if occurs  Pt shold call back in one week with response

## 2019-03-23 NOTE — Telephone Encounter (Signed)
Appointment today will be cancelled See telephone encounter dated 3/23 from Dr. Tenny Craw.

## 2019-03-23 NOTE — Telephone Encounter (Signed)
Called patient and confirmed pharmacy and reviewed recommendations from Dr. Tenny Craw below.  Pt verbalizes understanding and has no questions at this time.

## 2019-04-03 ENCOUNTER — Telehealth: Payer: Self-pay | Admitting: Internal Medicine

## 2019-04-03 NOTE — Telephone Encounter (Signed)
Patient reports 2 days ago started with chest pain center radiating out to under left arm 8/10.  They are sharp pains.  It hurts to breath, and especially if takes deep breath.  Feels worn out.  Taking amlodipine 2.5 mg, ibuprofen 400 mg daily and colchince 0.3 mg twice a day. Denies cough and fever, has no thermometer.  Had chills yesterday.  Had ov with Dr. Tenny Craw scheduled for 3/23 which was cancelled due to coronavirus.  Pt had telephone call with Dr. Tenny Craw at that time.  Pt aware I will forward to Dr. Tenny Craw for review and recommendations and I will call her back.

## 2019-04-03 NOTE — Telephone Encounter (Signed)
New Message    Pt c/o of Chest Pain: STAT if CP now or developed within 24 hours  1. Are you having CP right now? Yes  2. Are you experiencing any other symptoms (ex. SOB, nausea, vomiting, sweating)? No  3. How long have you been experiencing CP? 2 Days   4. Is your CP continuous or coming and going? Right now, Continuous, She also has a tingle in her left hand and the chest pain is also on the left   5. Have you taken Nitroglycerin? No  ?

## 2019-04-06 NOTE — Telephone Encounter (Signed)
Pt advised, agreeable to plan.

## 2019-04-06 NOTE — Telephone Encounter (Signed)
Chest pain (esp with chills) does not sound cardiac in origin0 Keep on same meds   Contact PCP if continues

## 2019-04-09 ENCOUNTER — Ambulatory Visit: Payer: BLUE CROSS/BLUE SHIELD | Admitting: Nurse Practitioner

## 2019-04-09 ENCOUNTER — Encounter: Payer: Self-pay | Admitting: Nurse Practitioner

## 2019-04-09 ENCOUNTER — Other Ambulatory Visit: Payer: Self-pay

## 2019-04-09 VITALS — BP 140/90 | HR 91 | Temp 98.5°F | Ht 65.0 in | Wt 194.8 lb

## 2019-04-09 DIAGNOSIS — G4452 New daily persistent headache (NDPH): Secondary | ICD-10-CM

## 2019-04-09 DIAGNOSIS — I10 Essential (primary) hypertension: Secondary | ICD-10-CM

## 2019-04-09 MED ORDER — FLUTICASONE PROPIONATE 50 MCG/ACT NA SUSP: 2.0000 | Freq: Every day | NASAL | 2 refills | Status: DC

## 2019-04-09 NOTE — Progress Notes (Signed)
Subjective:     Patient ID: Melissa [name] , female    DOB: 1986/02/19 , 33 y.o.   MRN: [id number]   Chief Complaint  Patient presents with  . Hypertension    HPI  She has been working 6 hours with every 3rd day off.  Her job only open for 6 hours working 4 days out of the week due to COVID-19. She is the Education officer, community at [employer]. New schedule has started last week. She still does not feel well.  She continues to have chest pain - colchicine - has been on for 2 weeks by Dr. Tenny Craw thought to be related to inflammation around her heart.  She continues to have fatigue.  She is also having a headache.  She is wearing a mask and gloves at work. She is limiting her exposure. She is de-clothing when she gets home.  She is cleaning and hanging up clothes.  She is not collecting any donations in the back.  Not everyone at her job is masking.    Hypertension  This is a chronic problem. The current episode started more than 1 year ago. Pertinent negatives include no chest pain, headaches, palpitations or shortness of breath. There are no associated agents to hypertension. Risk factors for coronary artery disease include sedentary lifestyle. Past treatments include calcium channel blockers. There are no compliance problems.  Hypertensive end-organ damage includes CAD/MI and CVA. There is no history of angina. There is no history of chronic renal disease.  Headache   This is a new problem. The problem occurs daily. The problem has been waxing and waning. The pain is located in the frontal and parietal region. The pain quality is not similar to prior headaches. The quality of the pain is described as aching and dull. Pertinent negatives include no coughing, dizziness, sore throat or weakness. Nothing aggravates the symptoms. She has tried acetaminophen for the symptoms. Her past medical history is significant for hypertension, migraine headaches and sinus disease (history of seasonal allergies - no  current symptoms).     Past Medical History:  Diagnosis Date  . Abnormal Pap smear 2010   Repeat pap done;Hysteroscopy;Last pap 2012;was normal  . Anemia 2010   Iron supplements PP  . Asthma    Has a nebulizer;triggered by stress  . Colon polyps   . Headache(784.0)   . Heart attack [facility])    MD thinks may have had a heart attack;Cardiologist @ [facility]  . History of CVA (cerebrovascular accident) 04/09/2013   Pt reports presumptive "stroke" when she was 33y.o.; followed by [facility] neurology for "chronic nerve pain" and has been Rx'd Hydrocodone and Mobic  . Infection    BV;may get frequently  . Nerve pain    Chronic;d/t possible stroke and MI  . Placenta previa antepartum 2010   Placenta moved 3 days before baby was born  . Sickle cell trait (HCC)   . Stroke [facility]) 2013   MD thought may have had a stroke and heart attack;has neurologist @ [facility]     Family History  Problem Relation Age of Onset  . Sickle cell trait Father   . Hypertension Mother   . Anemia Mother   . Asthma Mother   . Cervical cancer Paternal Aunt   . Cervical cancer Paternal Grandmother   . Diabetes Paternal Grandmother   . Diabetes Paternal Grandfather   . Diabetes Paternal Aunt   . Asthma Brother   . Asthma Daughter   . Asthma Son  Current Outpatient Medications:  .  acetaminophen (TYLENOL) 500 MG tablet, Take 500 mg by mouth every 6 (six) hours as needed., Disp: , Rfl:  .  albuterol (PROVENTIL HFA;VENTOLIN HFA) 108 (90 BASE) MCG/ACT inhaler, Inhale 2 puffs into the lungs every 4 (four) hours as needed for wheezing or shortness of breath., Disp: 1 Inhaler, Rfl: 12 .  albuterol (PROVENTIL) (2.5 MG/3ML) 0.083% nebulizer solution, Take 2.5 mg by nebulization every 4 (four) hours as needed for wheezing or shortness of breath., Disp: , Rfl:  .  amLODipine (NORVASC) 2.5 MG tablet, Take 1 tablet (2.5 mg total) by mouth daily., Disp: 90 tablet, Rfl: 3 .  colchicine 0.6 MG tablet, Take 0.5  tablets (0.3 mg total) by mouth 2 (two) times daily., Disp: 30 tablet, Rfl: 1 .  etonogestrel (NEXPLANON) 68 MG IMPL implant, 1 each by Subdermal route once., Disp: , Rfl:  .  ibuprofen (ADVIL,MOTRIN) 200 MG tablet, Take 200 mg by mouth every 6 (six) hours as needed., Disp: , Rfl:  .  mometasone-formoterol (DULERA) 200-5 MCG/ACT AERO, Inhale 2 puffs into the lungs daily., Disp: 1 Inhaler, Rfl: 0 .  montelukast (SINGULAIR) 10 MG tablet, Take 1 tablet (10 mg total) by mouth at bedtime., Disp: 30 tablet, Rfl: 3 .  Spacer/Aero-Holding Chambers DEVI, 1 Device by Does not apply route as directed., Disp: 1 each, Rfl: 0 .  chlorpheniramine-HYDROcodone (TUSSIONEX) 10-8 MG/5ML SUER, Take 5 mLs by mouth every 12 (twelve) hours as needed for cough. (Patient not taking: Reported on 04/09/2019), Disp: 115 mL, Rfl: 0   Allergies  Allergen Reactions  . Ginger Anaphylaxis     Review of Systems  Constitutional: Negative.  Negative for fatigue.  HENT: Negative for sore throat.   Eyes: Negative for visual disturbance.  Respiratory: Negative.  Negative for cough and shortness of breath.   Cardiovascular: Negative.  Negative for chest pain, palpitations and leg swelling.  Gastrointestinal: Negative.   Endocrine: Negative.   Musculoskeletal: Negative.   Skin: Negative.   Neurological: Negative for dizziness, weakness and headaches.  Psychiatric/Behavioral: Negative for confusion. The patient is not nervous/anxious.      Today's Vitals   04/09/19 0926  BP: 140/90  Pulse: 91  Temp: 98.5 F (36.9 C)  TempSrc: Oral  SpO2: 97%  Weight: 194 lb 12.8 oz (88.4 kg)  Height: 5\' 5"  (1.651 m)   Body mass index is 32.42 kg/m.   Objective:  Physical Exam Vitals signs reviewed.  Constitutional:      Appearance: She is well-developed.  HENT:     Head: Normocephalic and atraumatic.  Eyes:     Pupils: Pupils are equal, round, and reactive to light.  Cardiovascular:     Rate and Rhythm: Normal rate and regular  rhythm.     Pulses: Normal pulses.     Heart sounds: Normal heart sounds. No murmur.  Pulmonary:     Effort: Pulmonary effort is normal.     Breath sounds: Normal breath sounds.  Musculoskeletal: Normal range of motion.  Skin:    General: Skin is warm and dry.     Capillary Refill: Capillary refill takes less than 2 seconds.  Neurological:     General: No focal deficit present.     Mental Status: She is alert and oriented to person, place, and time.     Cranial Nerves: No cranial nerve deficit.  Psychiatric:        Mood and Affect: Mood normal.         Assessment  And Plan:   1. Essential hypertension . B/P is controlled.  . CMP ordered to check renal function.  . The importance of regular exercise and dietary modification was stressed to the patient.  . Stressed importance of losing ten percent of her body weight to help with B/P control.  . The weight loss would help with decreasing cardiac and cancer risk as well.  . She is taking colchicine for there possible cardiac spasms  2. New daily persistent headache  I am hesitant to give the patient any steroids due to the moderate risk for COVID-19   Arnette Felts, FNP    THE PATIENT IS ENCOURAGED TO PRACTICE SOCIAL DISTANCING DUE TO THE COVID-19 PANDEMIC.

## 2019-04-14 ENCOUNTER — Telehealth: Payer: Self-pay | Admitting: *Deleted

## 2019-04-14 ENCOUNTER — Other Ambulatory Visit: Payer: Self-pay | Admitting: Internal Medicine

## 2019-04-14 NOTE — Telephone Encounter (Signed)
Unable to route message not a [facility] provider.

## 2019-04-20 ENCOUNTER — Ambulatory Visit: Payer: BLUE CROSS/BLUE SHIELD | Admitting: Internal Medicine

## 2019-04-20 ENCOUNTER — Other Ambulatory Visit: Payer: Self-pay

## 2019-04-20 ENCOUNTER — Encounter: Payer: Self-pay | Admitting: Internal Medicine

## 2019-04-20 DIAGNOSIS — K219 Gastro-esophageal reflux disease without esophagitis: Secondary | ICD-10-CM

## 2019-04-20 DIAGNOSIS — J4530 Mild persistent asthma, uncomplicated: Secondary | ICD-10-CM

## 2019-04-20 MED ORDER — PANTOPRAZOLE SODIUM 40 MG PO TBEC: DELAYED_RELEASE_TABLET | ORAL | 2 refills | Status: DC

## 2019-04-20 NOTE — Patient Instructions (Addendum)
Protonix 40  mg Take 30-60 min before last  meal of the day x for at least a few weeks  GERD (REFLUX)  is an extremely common cause of respiratory symptoms just like yours , many times with no obvious heartburn at all.    It can be treated with medication, but also with lifestyle changes including elevation of the head of your bed (ideally with 6 -8inch blocks under the headboard of your bed),  Smoking cessation, avoidance of late meals, excessive alcohol, and avoid fatty foods, chocolate, peppermint, colas, red wine, and acidic juices such as orange juice.  NO MINT OR MENTHOL PRODUCTS SO NO COUGH DROPS  USE SUGARLESS CANDY INSTEAD (Jolley ranchers or Stover's or Life Savers) or even ice chips will also do - the key is to swallow to prevent all throat clearing. NO OIL BASED VITAMINS - use powdered substitutes.  Avoid fish oil when coughing.  Dulera 200 up to 2 pffs every 12 hours - no spacer needed   Ok to stop singulair  - restart for nasal draingage, itching, sneezing and wheezing    Please schedule a follow up visit in 3 months but call sooner if needed

## 2019-04-20 NOTE — Progress Notes (Signed)
Melissa [name], female    DOB: [birth year],    MRN: [id number]   Brief patient profile:  20 yobf never smoker, born term, onset asthma per mom  w/in a month of birth  with chronic symptoms of doe and episodically worse worse esp  in fall   and eventually eval by Francee Nodal on shots x years rx clariton/singulair / advair better and able to play soccer as goalie  And do PE twice weekly but would almost always  use rescue whenever exercising last took shots senior in HS and went to college  [employer] with lots mold > had to come home p Freshman year "due to breathing"  And then  IUP age 57 maintained on singulair and saba only and remained on neb avg twice weekly.  eval this office 09/22/15 with rec dulera 200 bid/ singulair / gerd rx and f/u in 2 weeks and did not return nor maintain on dulera (listed but never refilled p 08/2015 ) then admit 2 x in 2020 the second:     Admit date: 02/18/2019 Discharge date: 02/19/2019  Primary Care Provider: Dorothyann Peng, MD  Primary Cardiologist: Dietrich Pates, MD  Primary Electrophysiologist:  None   Discharge Diagnoses    Principal Problem:   Chest pain with high risk of acute coronary syndrome - vs. acute pericarditis Active Problems:   Severe persistent asthma with allergic rhinitis with status asthmaticus   ST elevation   Acute pericarditis   Chest pain   Allergies     Allergies  Allergen Reactions  . Ginger Anaphylaxis    Diagnostic Studies/Procedures    LEFT HEART CATH AND CORONARY ANGIOGRAPHY 02/18/19  Conclusion    LV end diastolic pressure is normal.  The left ventricular systolic function is normal.  There is no aortic valve stenosis.  The left ventricular ejection fraction is 55-65% by visual estimate.  SUMMARY  Angiographically normal coronary arteries  Normal LVEF and EDP. No regional wall motion normality on LV gram.  RECOMMENDATIONS  Admit to telemetry with presumed diagnosis of pericarditis.  Check  2D echocardiogram and inflammatory markers.  Start colchicine and ibuprofen.  Monitor closely for exacerbation of respiratory issues.  Bryan Lemma, MD  _____________  Echocardiogram 02/19/19 IMPRESSIONS 1. The left ventricle has normal systolic function with an ejection fraction of 60-65%. The cavity size was normal. Left ventricular diastolic parameters were normal No evidence of left ventricular regional wall motion abnormalities. 2. The right ventricle has normal systolic function. The cavity was normal. There is no increase in right ventricular wall thickness. 3. The mitral valve is normal in structure. 4. The tricuspid valve is normal in structure. 5. The aortic valve is normal in structure. 6. The pulmonic valve was normal in structure. 7. No evidence of left ventricular regional wall motion abnormalities.  FINDINGS Left Ventricle: The left ventricle has normal systolic function, with an ejection fraction of 60-65%. The cavity size was normal. There is no increase in left ventricular wall thickness. Left ventricular diastolic parameters were normal No evidence of  left ventricular regional wall motion abnormalities.. Right Ventricle: The right ventricle has normal systolic function. The cavity was normal. There is no increase in right ventricular wall thickness. Left Atrium: left atrial size was normal in size Right Atrium: right atrial size was normal in size. Interatrial Septum: No atrial level shunt detected by color flow Doppler. Pericardium: There is no evidence of pericardial effusion. Mitral Valve: The mitral valve is normal in structure. Mitral valve regurgitation  is trivial by color flow Doppler. Tricuspid Valve: The tricuspid valve is normal in structure. Tricuspid valve regurgitation was not visualized by color flow Doppler. Aortic Valve: The aortic valve is normal in structure. Aortic valve regurgitation was not visualized by color flow Doppler. Pulmonic  Valve: The pulmonic valve was normal in structure. Pulmonic valve regurgitation is not visualized by color flow Doppler. Venous: The inferior vena cava is normal in size with greater than 50% respiratory variability.    History of Present Illness     [name] Burtonis a 33 y.o.AAfemale, nonsmoker w/ history of asthma, recent hospitalization for acute hypoxic respiratory failure 2/2 asthma exacerbation and flu butwith no prior cardiac history, presenting as a CODE STEMI w/ active CP and inferolateral ST elevations.   She denies any family history of CAD. Family history is notable for HTN in mother and DM in paternal aunt. She is on inhalers for asthma but no other medications. She reports an allergy to ginger (respiratory distress/ anaphylaxis). There is also noted in her medical records a diagnosis of presumptive "stroke" but no records to confirm this.   She was admitted from 2/8-2/13 for acute hypoxic respiratory failure 2/2 acute asthma exacerbation and also tested + for influenza A. She was treated by IM. Got IV steroids and inhalers and asthma improved. She received Tamiflu for influenza.   She was discharged home on 2/13. Pt decided to go back to work today but felt that she went back too early. She has had CP x 2 days, but CP worsened today. Described as SS chest pressure/ tightness. No radiation. She notes mild dyspnea. Pt initially went to [facility] ED and was found to have inferolateral ST elevations. Pt was given 325 mg of ASA, 4,000 U heparin, 2SL NGT, placed on supplemental O2 and transferred to [facility] for emergent cardiac cath.    Hospital Course     Consultants: None  She was brought directly to cardaic catheterization lab 02/18/19. Initial attempt radial access was unsuccessful because of spasm. Swithced to femoral access. Groin angiography was performed showing no evidence of coronary disease. On the monitor, her inferior lateral ST elevations seem to have resolved and she was no  longer having chest pain. Note, 2 gm morphine relieved her pain.   LV gram showed relative normal EF with no regional wall motion normalities. Normal EDP. Echo showed normal LVEF with trivial pericardial effusion (may be normal).   Pt still has some pleuritic chest pain, mild, and some pressing feeling on her chest. Some of her pain seems to be musculoskeletal, also could have some vasospasm. Dr. Tenny Craw is not convinced of pericarditis (no rub, not positional). Some pleuritic pain may be related to recent flu, asthma. Prefer not to send home on colchicine. She can take ibuprofen. We will start amlodipine 2.5 mg for BP and possible coronary spasm.   Follow up EKG today shows no further ST elevation.   She has an appt with Dr. Sherene Sires tomorrow, pulmonology. She will continue on inhalers and prednisone taper.   She will follow up with Dr. Tenny Craw in the office.   Patient has been seen by Dr. Tenny Craw today and deemed ready for discharge home. All follow up appointments have been scheduled. Discharge medications are listed below.      History of Present Illness  02/20/2019  Pulmonary/ 1st office eval/Gina Costilla  Chief Complaint  Patient presents with  . Pulmonary Consult    Referred by Dr. Isidoro Donning for eval of respiratory failure and asthma.  Pt just d/c'ed from the hospital 02/19/2019.  She c/o wheezing at night.  She has DOE. She has been using albuterol inhaler and neb daily.   Dyspnea:  Room to room  Cough: min mucoid  Sleep: 2 pillows  SABA use: as above   W/c bound due to R fem arterry cath site pain  rec Plan A = Automatic = dulera 100 Take 2 puffs first thing in am and then another 2 puffs about 12 hours later.  Work on inhaler technique:   Plan B = Backup Only use your albuterol inhaler (proventil) as a rescue medication  Plan C = Crisis - only use your albuterol nebulizer if fail plan B   NP eval/rx  03/06/2019  Needs spacer Trial increase Dulera 200- take 2 puffs twice daily (sample  given) Add omeprazole 20 mg daily until next follow-up visit (you can get this over the counter) Allergy profile: IgE 25 and RAST neg   04/20/2019  f/u ov/Danely Bayliss re: intermittent asthma/ off ppi x 2 weeks but still on singulair and dulera Chief Complaint  Patient presents with  . Follow-up    F/U re: Asthma. Increased Dulera to 200 at last OV, patient does not notice a diffrence. She states her breathing has been good. States she has not been wheezing, overall she states she has been good.   Dyspnea:  Started walking neighborhood some hills ok  Cough: none, no need for tussionex Sleeping: restless/ feels slt heavy in chest immediately on supine despite dulera 200 before bedtime always are of it but varies in intensity s pattern  SABA use: once since last ov, has not tried for heavy chest sensation   02: none    No obvious day to day or daytime variability or assoc excess/ purulent sputum or mucus plugs or hemoptysis or cp  , subjective wheeze or overt sinus or hb symptoms.   Also denies any obvious fluctuation of symptoms with weather or environmental changes or other aggravating or alleviating factors except as outlined above   No unusual exposure hx or h/o childhood pna or knowledge of premature birth.  Current Allergies, Complete Past Medical History, Past Surgical History, Family History, and Social History were reviewed in [employer] record.  ROS  The following are not active complaints unless bolded Hoarseness, sore throat, dysphagia, dental problems, itching, sneezing,  nasal congestion or discharge of excess mucus or purulent secretions, ear ache,   fever, chills, sweats, unintended wt loss or wt gain, classically pleuritic or exertional cp,  orthopnea pnd or arm/hand swelling  or leg swelling, presyncope, palpitations, abdominal pain, anorexia, nausea, vomiting, diarrhea  or change in bowel habits or change in bladder habits, change in stools or change in  urine, dysuria, hematuria,  rash, arthralgias, visual complaints, headache, numbness, weakness or ataxia or problems with walking or coordination,  change in mood or  memory.        Current Meds  Medication Sig  . acetaminophen (TYLENOL) 500 MG tablet Take 500 mg by mouth every 6 (six) hours as needed.  [name] albuterol (PROVENTIL HFA;VENTOLIN HFA) 108 (90 BASE) MCG/ACT inhaler Inhale 2 puffs into the lungs every 4 (four) hours as needed for wheezing or shortness of breath.  [name] albuterol (PROVENTIL) (2.5 MG/3ML) 0.083% nebulizer solution Take 2.5 mg by nebulization every 4 (four) hours as needed for wheezing or shortness of breath.  [name] amLODipine (NORVASC) 2.5 MG tablet Take 1 tablet (2.5 mg total) by mouth daily.  . chlorpheniramine-HYDROcodone (  TUSSIONEX) 10-8 MG/5ML SUER Take 5 mLs by mouth every 12 (twelve) hours as needed for cough.  . colchicine 0.6 MG tablet Take 0.5 tablets (0.3 mg total) by mouth 2 (two) times daily.  [name] etonogestrel (NEXPLANON) 68 MG IMPL implant 1 each by Subdermal route once.  . fluticasone (FLONASE) 50 MCG/ACT nasal spray Place 2 sprays into both nostrils daily.  [name] ibuprofen (ADVIL,MOTRIN) 200 MG tablet Take 200 mg by mouth every 6 (six) hours as needed.  . mometasone-formoterol (DULERA) 200-5 MCG/ACT AERO Inhale 2 puffs into the lungs daily.  . montelukast (SINGULAIR) 10 MG tablet Take 1 tablet (10 mg total) by mouth at bedtime.  [name] Spacer/Aero-Holding Chambers DEVI 1 Device by Does not apply route as directed.                Objective:       Wt Readings from Last 3 Encounters:  04/09/19 194 lb 12.8 oz (88.4 kg)  03/19/19 195 lb (88.5 kg)  03/06/19 196 lb (88.9 kg)     Vital signs reviewed - Note on arrival 02 sats  100% on RA      amb bf nad   HEENT: nl dentition, turbinates bilaterally, and oropharynx. Nl external ear canals without cough reflex   NECK :  without JVD/Nodes/TM/ nl carotid upstrokes bilaterally   LUNGS: no acc muscle use,  Nl  contour chest which is clear to A and P bilaterally without cough on insp or exp maneuvers   CV:  RRR  no s3 or murmur or increase in P2, and no edema   ABD:  soft and nontender with nl inspiratory excursion in the supine position. No bruits or organomegaly appreciated, bowel sounds nl  MS:  Nl gait/ ext warm without deformities, calf tenderness, cyanosis or clubbing No obvious joint restrictions   SKIN: warm and dry without lesions    NEURO:  alert, approp, nl sensorium with  no motor or cerebellar deficits apparent.           Lab Results  Component Value Date   WBC 12.7 (H) 02/19/2019   HGB 10.3 (L) 02/19/2019   HCT 33.3 (L) 02/19/2019   MCV 82.2 02/19/2019   PLT 224 02/19/2019          Assessment

## 2019-04-21 ENCOUNTER — Encounter: Payer: Self-pay | Admitting: Internal Medicine

## 2019-04-21 NOTE — Assessment & Plan Note (Addendum)
Onset reported in childhood  Spirometry 09/08/2015 wnl including fef 25-75. -  02/20/2019  After extensive coaching inhaler device,  effectiveness =    75% from a baseline of 25% > continue dulera 100 2bid/ singulair   03/06/2019  added spacer. >>Trial dulera 200. Add PPI   - 03/06/2019 Allergy profile: IgE 25 and RAST neg - 04/20/2019  After extensive coaching inhaler device,  effectiveness =    90% so rec dulera 200 up to 2 q 12 h prn and try off singulair   All goals of chronic asthma control met including optimal function and elimination of symptoms with minimal need for rescue therapy.  Contingencies discussed in full including contacting this office immediately if not controlling the symptoms using the rule of two's.    Based on two studies from NEJM  378; 20 p 1865 (2018) and 380 : p2020-30 (2019) in pts with mild asthma it is reasonable to use low dose symbicort (and by inference dulera 200 for now should work just as well) eg 80 2bid "prn" flare in this setting but I emphasized this was only shown with symbicort and takes advantage of the rapid onset of action but is not the same as "rescue therapy" but can be stopped once the acute symptoms have resolved and the need for rescue has been minimized (< 2 x weekly)     

## 2019-04-21 NOTE — Assessment & Plan Note (Signed)
Much  of her noct symptoms are likely to be due to GERD related so rec try elevating hob and change ppi to ac supper with f/u GI prn    I had an extended discussion with the patient reviewing all relevant studies completed to date and  lasting 15 to 20 minutes of a 25 minute visit    See device teaching which extended face to face time for this visit.  Each maintenance medication was reviewed in detail including emphasizing most importantly the difference between maintenance and prns and under what circumstances the prns are to be triggered using an action plan format that is not reflected in the computer generated alphabetically organized AVS which I have not found useful in most complex patients, especially with respiratory illnesses  Please see AVS for specific instructions unique to this visit that I personally wrote and verbalized to the the pt in detail and then reviewed with pt  by my nurse highlighting any  changes in therapy recommended at today's visit to their plan of care.

## 2019-04-24 ENCOUNTER — Encounter: Payer: Self-pay | Admitting: Internal Medicine

## 2019-05-19 ENCOUNTER — Encounter: Payer: Self-pay | Admitting: Internal Medicine

## 2019-05-27 ENCOUNTER — Ambulatory Visit: Payer: BLUE CROSS/BLUE SHIELD | Admitting: Internal Medicine

## 2019-05-27 ENCOUNTER — Other Ambulatory Visit: Payer: Self-pay

## 2019-05-27 ENCOUNTER — Encounter: Payer: Self-pay | Admitting: Internal Medicine

## 2019-05-27 DIAGNOSIS — J4530 Mild persistent asthma, uncomplicated: Secondary | ICD-10-CM | POA: Diagnosis not present

## 2019-05-27 MED ORDER — MOMETASONE FURO-FORMOTEROL FUM 100-5 MCG/ACT IN AERO: 2.0000 | INHALATION_SPRAY | Freq: Two times a day (BID) | RESPIRATORY_TRACT | 0 refills | Status: DC

## 2019-05-27 NOTE — Progress Notes (Signed)
Kariah Georgiann Mohs, female    DOB: 02/23/1986,    MRN: [id number]   Brief patient profile:  22 yobf never smoker, born term, onset asthma per mom  w/in a month of birth  with chronic symptoms of doe and episodically worse worse esp  in fall   and eventually eval by Francee Nodal on shots x years rx clariton/singulair / advair better and able to play soccer as goalie  And do PE twice weekly but would almost always  use rescue whenever exercising last took shots senior in HS and went to college  [employer] with lots mold > had to come home p Freshman year "due to breathing"  And then  [employer] age 33 maintained on singulair and saba only and remained on neb avg twice weekly.  eval this office 09/22/15 with rec dulera 200 bid/ singulair / gerd rx and f/u in 2 weeks and did not return nor maintain on dulera (listed but never refilled p 08/2015 ) then admit 2 x in 2020 the second:     Admit date: 02/18/31 Discharge date: 02/19/2019  Primary Care Provider: Dorothyann Peng, MD  Primary Cardiologist: Dietrich Pates, MD  Primary Electrophysiologist:  None   Discharge Diagnoses    Principal Problem:   Chest pain with high risk of acute coronary syndrome - vs. acute pericarditis Active Problems:   Severe persistent asthma with allergic rhinitis with status asthmaticus   ST elevation   Acute pericarditis   Chest pain   Allergies     Allergies  Allergen Reactions  . Ginger Anaphylaxis    Diagnostic Studies/Procedures    LEFT HEART CATH AND CORONARY ANGIOGRAPHY 02/18/19  Conclusion    LV end diastolic pressure is normal.  The left ventricular systolic function is normal.  There is no aortic valve stenosis.  The left ventricular ejection fraction is 55-65% by visual estimate.  SUMMARY  Angiographically normal coronary arteries  Normal LVEF and EDP. No regional wall motion normality on LV gram.  RECOMMENDATIONS  Admit to telemetry with presumed diagnosis of pericarditis.  Check  2D echocardiogram and inflammatory markers.  Start colchicine and ibuprofen.  Monitor closely for exacerbation of respiratory issues.  Bryan Lemma, MD  _____________  Echocardiogram 02/19/19 IMPRESSIONS 1. The left ventricle has normal systolic function with an ejection fraction of 60-65%. The cavity size was normal. Left ventricular diastolic parameters were normal No evidence of left ventricular regional wall motion abnormalities. 2. The right ventricle has normal systolic function. The cavity was normal. There is no increase in right ventricular wall thickness. 3. The mitral valve is normal in structure. 4. The tricuspid valve is normal in structure. 5. The aortic valve is normal in structure. 6. The pulmonic valve was normal in structure. 7. No evidence of left ventricular regional wall motion abnormalities.  FINDINGS Left Ventricle: The left ventricle has normal systolic function, with an ejection fraction of 60-65%. The cavity size was normal. There is no increase in left ventricular wall thickness. Left ventricular diastolic parameters were normal No evidence of  left ventricular regional wall motion abnormalities.. Right Ventricle: The right ventricle has normal systolic function. The cavity was normal. There is no increase in right ventricular wall thickness. Left Atrium: left atrial size was normal in size Right Atrium: right atrial size was normal in size. Interatrial Septum: No atrial level shunt detected by color flow Doppler. Pericardium: There is no evidence of pericardial effusion. Mitral Valve: The mitral valve is normal in structure. Mitral valve regurgitation  is trivial by color flow Doppler. Tricuspid Valve: The tricuspid valve is normal in structure. Tricuspid valve regurgitation was not visualized by color flow Doppler. Aortic Valve: The aortic valve is normal in structure. Aortic valve regurgitation was not visualized by color flow Doppler. Pulmonic  Valve: The pulmonic valve was normal in structure. Pulmonic valve regurgitation is not visualized by color flow Doppler. Venous: The inferior vena cava is normal in size with greater than 50% respiratory variability.    History of Present Illness     Danija A Burtonis a 33 y.o.AAfemale, nonsmoker w/ history of asthma, recent hospitalization for acute hypoxic respiratory failure 2/2 asthma exacerbation and flu butwith no prior cardiac history, presenting as a CODE STEMI w/ active CP and inferolateral ST elevations.   She denies any family history of CAD. Family history is notable for HTN in mother and DM in paternal aunt. She is on inhalers for asthma but no other medications. She reports an allergy to ginger (respiratory distress/ anaphylaxis). There is also noted in her medical records a diagnosis of presumptive "stroke" but no records to confirm this.   She was admitted from 2/8-2/13 for acute hypoxic respiratory failure 2/2 acute asthma exacerbation and also tested + for influenza A. She was treated by IM. Got IV steroids and inhalers and asthma improved. She received Tamiflu for influenza.   She was discharged home on 2/13. Pt decided to go back to work today but felt that she went back too early. She has had CP x 2 days, but CP worsened today. Described as SS chest pressure/ tightness. No radiation. She notes mild dyspnea. Pt initially went to [facility] ED and was found to have inferolateral ST elevations. Pt was given 325 mg of ASA, 4,000 U heparin, 2SL NGT, placed on supplemental O2 and transferred to [facility] for emergent cardiac cath.    Hospital Course     Consultants: None  She was brought directly to cardaic catheterization lab 02/18/19. Initial attempt radial access was unsuccessful because of spasm. Swithced to femoral access. Groin angiography was performed showing no evidence of coronary disease. On the monitor, her inferior lateral ST elevations seem to have resolved and she was no  longer having chest pain. Note, 2 gm morphine relieved her pain.   LV gram showed relative normal EF with no regional wall motion normalities. Normal EDP. Echo showed normal LVEF with trivial pericardial effusion (may be normal).   Pt still has some pleuritic chest pain, mild, and some pressing feeling on her chest. Some of her pain seems to be musculoskeletal, also could have some vasospasm. Dr. Tenny Craw is not convinced of pericarditis (no rub, not positional). Some pleuritic pain may be related to recent flu, asthma. Prefer not to send home on colchicine. She can take ibuprofen. We will start amlodipine 2.5 mg for BP and possible coronary spasm.   Follow up EKG today shows no further ST elevation.   She has an appt with Dr. Sherene Sires tomorrow, pulmonology. She will continue on inhalers and prednisone taper.   She will follow up with Dr. Tenny Craw in the office.   Patient has been seen by Dr. Tenny Craw today and deemed ready for discharge home. All follow up appointments have been scheduled. Discharge medications are listed below.      History of Present Illness  02/20/2019  Pulmonary/ 1st office eval/Wert  Chief Complaint  Patient presents with  . Pulmonary Consult    Referred by Dr. Isidoro Donning for eval of respiratory failure and asthma.  Pt just d/c'ed from the hospital 02/19/2019.  She c/o wheezing at night.  She has DOE. She has been using albuterol inhaler and neb daily.   Dyspnea:  Room to room  Cough: min mucoid  Sleep: 2 pillows  SABA use: as above   W/c bound due to R fem arterry cath site pain  rec Plan A = Automatic = dulera 100 Take 2 puffs first thing in am and then another 2 puffs about 12 hours later.  Work on inhaler technique:   Plan B = Backup Only use your albuterol inhaler (proventil) as a rescue medication  Plan C = Crisis - only use your albuterol nebulizer if fail plan B   NP eval/rx  03/06/2019  Needs spacer Trial increase Dulera 200- take 2 puffs twice daily (sample  given) Add omeprazole 20 mg daily until next follow-up visit (you can get this over the counter) Allergy profile: IgE 25 and RAST neg   04/20/2019  f/u ov/Wert re: intermittent asthma/ off ppi x 2 weeks but still on singulair and dulera Chief Complaint  Patient presents with  . Follow-up    F/U re: Asthma. Increased Dulera to 200 at last OV, patient does not notice a diffrence. She states her breathing has been good. States she has not been wheezing, overall she states she has been good.   Dyspnea:  Started walking neighborhood some hills ok  Cough: none, no need for tussionex Sleeping: restless/ feels slt heavy in chest immediately on supine despite dulera 200 before bedtime always aware of it but varies in intensity s pattern  SABA use: once since last ov, has not tried for heavy chest sensation   REC Protonix 40  mg Take 30-60 min before last  meal of the day x for at least a few weeks GERD diet  Dulera 200 up to 2 pffs every 12 hours - no spacer needed  Ok to stop singulair  - restart for nasal drainage, itching, sneezing and wheezing    05/27/2019 acute extended ov/Wert re: asthma / atypical cp  Chief Complaint  Patient presents with  . Acute Visit    Chest feels more tight and "sharp pain" worse since the last visit. She also c/o increased SOB "since the weather change"- past 2 wks.   nocturnal symptoms much better with PPi before supper  Then off dulera x sev days started with tightness x 2 weeks prior to OV  Did not try saba or restart dulera  No cough  Sleeping better  Cp's are fleeting and knife like midline not worse with breathing, swallowing bending over with with ex and no assoc nausea vomiting or diaphoresis.    No obvious day to day or daytime variability or assoc excess/ purulent sputum or mucus plugs or hemoptysis or subjective wheeze or overt sinus or hb symptoms.   Sleeping fine  without nocturnal  or early am exacerbation  of respiratory  c/o's or need for noct  saba. Also denies any obvious fluctuation of symptoms with weather or environmental changes or other aggravating or alleviating factors except as outlined above   No unusual exposure hx or h/o childhood pna/ asthma or knowledge of premature birth.  Current Allergies, Complete Past Medical History, Past Surgical History, Family History, and Social History were reviewed in [employer] record.  ROS  The following are not active complaints unless bolded Hoarseness, sore throat, dysphagia, dental problems, itching, sneezing,  nasal congestion or discharge of excess mucus or purulent  secretions, ear ache,   fever, chills, sweats, unintended wt loss or wt gain, classically pleuritic or exertional cp,  orthopnea pnd or arm/hand swelling  or leg swelling, presyncope, palpitations, abdominal pain, anorexia, nausea, vomiting, diarrhea  or change in bowel habits or change in bladder habits, change in stools or change in urine, dysuria, hematuria,  rash, arthralgias, visual complaints, headache, numbness, weakness or ataxia or problems with walking or coordination,  change in mood or  memory.        Current Meds  Medication Sig  . acetaminophen (TYLENOL) 500 MG tablet Take 500 mg by mouth every 6 (six) hours as needed.  [name]. albuterol (PROVENTIL HFA;VENTOLIN HFA) 108 (90 BASE) MCG/ACT inhaler Inhale 2 puffs into the lungs every 4 (four) hours as needed for wheezing or shortness of breath.  [name]. albuterol (PROVENTIL) (2.5 MG/3ML) 0.083% nebulizer solution Take 2.5 mg by nebulization every 4 (four) hours as needed for wheezing or shortness of breath.  [name]. amLODipine (NORVASC) 2.5 MG tablet Take 1 tablet (2.5 mg total) by mouth daily.  . colchicine 0.6 MG tablet Take 0.5 tablets (0.3 mg total) by mouth 2 (two) times daily.  [name]. etonogestrel (NEXPLANON) 68 MG IMPL implant 1 each by Subdermal route once.  . fluticasone (FLONASE) 50 MCG/ACT nasal spray Place 2 sprays into both nostrils daily.  [name].  ibuprofen (ADVIL,MOTRIN) 200 MG tablet Take 200 mg by mouth every 6 (six) hours as needed.  . pantoprazole (PROTONIX) 40 MG tablet Take 30-60 min before last  meal of the day  . [DISCONTINUED] mometasone-formoterol (DULERA) 200-5 MCG/ACT AERO Inhale 2 puffs into the lungs daily.                 Objective:       05/27/2019        201   04/09/19 194 lb 12.8 oz (88.4 kg)  03/19/19 195 lb (88.5 kg)  03/06/19 196 lb (88.9 kg)     amb bf belle indifference affect   Vital signs reviewed - Note on arrival 02 sats  100% on RA   HEENT: nl dentition, turbinates bilaterally, and oropharynx. Nl external ear canals without cough reflex   NECK :  without JVD/Nodes/TM/ nl carotid upstrokes bilaterally   LUNGS: no acc muscle use,  Nl contour chest which is clear to A and P bilaterally without cough on insp or exp maneuvers   CV:  RRR  no s3 or murmur or increase in P2, and no edema   ABD:  soft and nontender with nl inspiratory excursion in the supine position. No bruits or organomegaly appreciated, bowel sounds nl  MS:  Nl gait/ ext warm without deformities, calf tenderness, cyanosis or clubbing No obvious joint restrictions   SKIN: warm and dry without lesions    NEURO:  alert, approp, nl sensorium with  no motor or cerebellar deficits apparent.                 Assessment

## 2019-05-27 NOTE — Patient Instructions (Addendum)
Restart dulera 100 Take 2 puffs first thing in am and then another 2 puffs about 12 hours later.   Work on inhaler technique:  relax and gently blow all the way out then take a nice smooth deep breath back in, triggering the inhaler at same time you start breathing in.  Hold for up to 5 seconds if you can. Blow out thru nose. Rinse and gargle with water when done  If unable to trigger and breathe in at same time, use spacer    Only use your albuterol as a rescue medication to be used if you can't catch your breath by resting or doing a relaxed purse lip breathing pattern.  - The less you use it, the better it will work when you need it. - Ok to use up to 2 puffs  every 4 hours if you must but call for immediate appointment if use goes up over your usual need - Don't leave home without it !!  (think of it like the spare tire for your car)   If not improved then add back singulair 10 mg one daily    Keep follow up appt

## 2019-05-29 ENCOUNTER — Encounter: Payer: Self-pay | Admitting: Internal Medicine

## 2019-05-29 NOTE — Assessment & Plan Note (Signed)
Onset reported in childhood  Spirometry 09/08/2015 wnl including fef 25-75. -  02/20/2019  After extensive coaching inhaler device,  effectiveness =    75% from a baseline of 25% > continue dulera 100 2bid/ singulair   03/06/2019  added spacer. >>Trial dulera 200. Add PPI   - 03/06/2019 Allergy profile: IgE 25 and RAST neg - 04/20/2019   rec dulera 200 up to 2 q 12 h prn and try off singulair - 05/27/2019  After extensive coaching inhaler device,  effectiveness =    90% restart dulera 100 2bid   She is not consistently able to trigger the device and breathing at the same time but when she does she does not very well.  I therefore asked her to go back on take the [facility] perfectly regularly so she gets used to using the technique and if cannot master it then we need to add a spacer next visit.  In the meantime I do not believe she needs anything else added to her regimen but could add Singulair back if the above is not effective.   I had an extended discussion with the patient reviewing all relevant studies completed to date and  lasting 15 to 20 minutes of a 25 minute acute office visit    See device teaching which extended face to face time for this visit.  Each maintenance medication was reviewed in detail including emphasizing most importantly the difference between maintenance and prns and under what circumstances the prns are to be triggered using an action plan format that is not reflected in the computer generated alphabetically organized AVS which I have not found useful in most complex patients, especially with respiratory illnesses  Please see AVS for specific instructions unique to this visit that I personally wrote and verbalized to the the pt in detail and then reviewed with pt  by my nurse highlighting any  changes in therapy recommended at today's visit to their plan of care.

## 2019-06-08 ENCOUNTER — Telehealth: Payer: Self-pay | Admitting: Internal Medicine

## 2019-06-08 DIAGNOSIS — Z3049 Encounter for surveillance of other contraceptives: Secondary | ICD-10-CM | POA: Diagnosis not present

## 2019-06-08 NOTE — Telephone Encounter (Signed)
Called patient and made aware of [employer] process. Gave her # to [employer] to check status. Made her aware MW was here up until 6/4 and now on vacation until 6/15. If forms were not here before 6/4 then signature will be pending his return. Pt verbalized understanding. Nothing further needed.

## 2019-06-08 NOTE — Telephone Encounter (Signed)
Pt had visit on 5/27 with MW. She dropped off her FMLA forms. As of today [employer] has not received. Pt contacted [employer] who states they have not received FMLA paperwork Please advise.

## 2019-06-09 NOTE — Telephone Encounter (Signed)
I have never received FMLA paperwork- Patient may need to have paperwork directly to [employer] from her disability company or employer- Patient contact [employer] at [phone] and get their fax number to send documents - pr

## 2019-06-09 NOTE — Telephone Encounter (Signed)
Spoke with the pt and notified of response per Sharl Ma  She verbalized understanding

## 2019-06-12 DIAGNOSIS — R87610 Atypical squamous cells of undetermined significance on cytologic smear of cervix (ASC-US): Secondary | ICD-10-CM | POA: Diagnosis not present

## 2019-06-12 DIAGNOSIS — Z01419 Encounter for gynecological examination (general) (routine) without abnormal findings: Secondary | ICD-10-CM | POA: Diagnosis not present

## 2019-06-12 DIAGNOSIS — Z113 Encounter for screening for infections with a predominantly sexual mode of transmission: Secondary | ICD-10-CM | POA: Diagnosis not present

## 2019-06-12 DIAGNOSIS — Z124 Encounter for screening for malignant neoplasm of cervix: Secondary | ICD-10-CM | POA: Diagnosis not present

## 2019-06-12 DIAGNOSIS — Z6832 Body mass index (BMI) 32.0-32.9, adult: Secondary | ICD-10-CM | POA: Diagnosis not present

## 2019-06-12 LAB — HM PAP SMEAR

## 2019-06-17 ENCOUNTER — Telehealth: Payer: Self-pay | Admitting: Internal Medicine

## 2019-06-17 NOTE — Telephone Encounter (Signed)
Patient had cath with normal coronary arteries   OK for implant

## 2019-06-17 NOTE — Telephone Encounter (Signed)
New message:    Melissa [name] from Dr Charlesetta Garibaldi would like to know if it is alright for pt to have Nexplanon Birth Control Device that is inserted in the pt's arm ?  It will last for 3 years. If she is not there, please leave a detailed message.

## 2019-06-18 ENCOUNTER — Ambulatory Visit: Payer: BC Managed Care – PPO | Admitting: Internal Medicine

## 2019-06-18 NOTE — Telephone Encounter (Signed)
Per Dr. Alan Ripper recommendations, patient okay for birth control implantation.  Underwent cardiac cath with normal coronary arteries.

## 2019-06-19 NOTE — Telephone Encounter (Signed)
I called and left a VM for Melissa [name] at Dr. Berneta Sages office with clearance for pt to be able to have Birth Control Implant. I offered to fax this to that office if she wanted to call me back to provide fax number.

## 2019-06-23 NOTE — Telephone Encounter (Signed)
Follow up   Per [facility] the fax # is [phone].

## 2019-06-23 NOTE — Telephone Encounter (Signed)
Call placed to Dr. Berneta Sages office. Left a detailed message for the surgery scheduler that clearance has been granted and if they needed a fax copy, to call back and provide the fax #.

## 2019-06-23 NOTE — Telephone Encounter (Signed)
   Primary Cardiologist:Paula Harrington Challenger, MD  Chart reviewed as part of pre-operative protocol coverage. Pre-op clearance already addressed by colleagues in earlier phone notes below. Will route to callback staff to assist in contacting Dr. Berneta Sages office to relay clearance has been granted. Will remove from pre-op APP box.  Charlie Pitter, PA-C 06/23/2019, 8:52 AM

## 2019-06-23 NOTE — Telephone Encounter (Signed)
See f/u below

## 2019-06-26 DIAGNOSIS — Z30017 Encounter for initial prescription of implantable subdermal contraceptive: Secondary | ICD-10-CM | POA: Diagnosis not present

## 2019-06-26 DIAGNOSIS — Z9889 Other specified postprocedural states: Secondary | ICD-10-CM | POA: Diagnosis not present

## 2019-07-01 ENCOUNTER — Telehealth: Payer: Self-pay | Admitting: Internal Medicine

## 2019-07-01 NOTE — Telephone Encounter (Signed)
LVM FOR PT THAT APPT FOR 7/2 CANCELLED DUE TO PROVIDER OUT OF OFC NO ANS LVM

## 2019-07-02 ENCOUNTER — Ambulatory Visit: Payer: BLUE CROSS/BLUE SHIELD | Admitting: Internal Medicine

## 2019-07-08 ENCOUNTER — Emergency Department (HOSPITAL_COMMUNITY)
Admission: EM | Admit: 2019-07-08 | Discharge: 2019-07-08 | Disposition: A | Discharge status: Home / Self Care | Payer: BC Managed Care – PPO | Attending: Emergency Medicine | Admitting: Emergency Medicine

## 2019-07-08 ENCOUNTER — Other Ambulatory Visit: Payer: Self-pay

## 2019-07-08 ENCOUNTER — Encounter (HOSPITAL_COMMUNITY): Payer: Self-pay | Admitting: Emergency Medicine

## 2019-07-08 ENCOUNTER — Other Ambulatory Visit: Payer: Self-pay | Admitting: Family

## 2019-07-08 ENCOUNTER — Emergency Department (HOSPITAL_COMMUNITY): Payer: BC Managed Care – PPO

## 2019-07-08 ENCOUNTER — Telehealth: Payer: BLUE CROSS/BLUE SHIELD | Admitting: Family

## 2019-07-08 DIAGNOSIS — R0789 Other chest pain: Secondary | ICD-10-CM | POA: Diagnosis not present

## 2019-07-08 DIAGNOSIS — J4552 Severe persistent asthma with status asthmaticus: Secondary | ICD-10-CM

## 2019-07-08 DIAGNOSIS — R079 Chest pain, unspecified: Secondary | ICD-10-CM | POA: Diagnosis not present

## 2019-07-08 DIAGNOSIS — J45901 Unspecified asthma with (acute) exacerbation: Secondary | ICD-10-CM

## 2019-07-08 DIAGNOSIS — R0689 Other abnormalities of breathing: Secondary | ICD-10-CM | POA: Diagnosis not present

## 2019-07-08 DIAGNOSIS — J80 Acute respiratory distress syndrome: Secondary | ICD-10-CM | POA: Diagnosis not present

## 2019-07-08 DIAGNOSIS — R0902 Hypoxemia: Secondary | ICD-10-CM | POA: Diagnosis not present

## 2019-07-08 DIAGNOSIS — Z79899 Other long term (current) drug therapy: Secondary | ICD-10-CM | POA: Diagnosis not present

## 2019-07-08 DIAGNOSIS — I10 Essential (primary) hypertension: Secondary | ICD-10-CM | POA: Diagnosis not present

## 2019-07-08 DIAGNOSIS — R0602 Shortness of breath: Secondary | ICD-10-CM

## 2019-07-08 DIAGNOSIS — R062 Wheezing: Secondary | ICD-10-CM | POA: Diagnosis not present

## 2019-07-08 LAB — CBC WITH DIFFERENTIAL/PLATELET
Abs Immature Granulocytes: 0.03 10*3/uL (ref 0.00–0.07)
Basophils Absolute: 0 10*3/uL (ref 0.0–0.1)
Basophils Relative: 0 %
Eosinophils Absolute: 0.1 10*3/uL (ref 0.0–0.5)
Eosinophils Relative: 1 %
HCT: 39.1 % (ref 36.0–46.0)
Hemoglobin: 12.1 g/dL (ref 12.0–15.0)
Immature Granulocytes: 0 %
Lymphocytes Relative: 15 %
Lymphs Abs: 1 10*3/uL (ref 0.7–4.0)
MCH: 26.1 pg (ref 26.0–34.0)
MCHC: 30.9 g/dL (ref 30.0–36.0)
MCV: 84.4 fL (ref 80.0–100.0)
Monocytes Absolute: 0.1 10*3/uL (ref 0.1–1.0)
Monocytes Relative: 2 %
Neutro Abs: 5.5 10*3/uL (ref 1.7–7.7)
Neutrophils Relative %: 82 %
Platelets: 243 10*3/uL (ref 150–400)
RBC: 4.63 MIL/uL (ref 3.87–5.11)
RDW: 14.7 % (ref 11.5–15.5)
WBC: 6.7 10*3/uL (ref 4.0–10.5)
nRBC: 0 % (ref 0.0–0.2)

## 2019-07-08 LAB — BASIC METABOLIC PANEL
Anion gap: 12 (ref 5–15)
BUN: 7 mg/dL (ref 6–20)
CO2: 18 mmol/L — ABNORMAL LOW (ref 22–32)
Calcium: 9 mg/dL (ref 8.9–10.3)
Chloride: 109 mmol/L (ref 98–111)
Creatinine, Ser: 0.84 mg/dL (ref 0.44–1.00)
GFR calc Af Amer: >60 mL/min (ref >60)
GFR calc non Af Amer: >60 mL/min (ref >60)
Glucose, Bld: 123 mg/dL — ABNORMAL HIGH (ref 70–99)
Potassium: 3.2 mmol/L — ABNORMAL LOW (ref 3.5–5.1)
Sodium: 139 mmol/L (ref 135–145)

## 2019-07-08 LAB — TROPONIN I (HIGH SENSITIVITY)
Troponin I (High Sensitivity): <2 ng/L (ref <18)
Troponin I (High Sensitivity): <2 ng/L (ref <18)

## 2019-07-08 MED ORDER — PREDNISONE 10 MG PO TABS: 20.0000 mg | ORAL_TABLET | Freq: Every day | ORAL | 0 refills | Status: AC

## 2019-07-08 NOTE — Progress Notes (Signed)
Based on what you shared with me, I feel your condition warrants further evaluation and I recommend that you be seen for a face to face office visit.  NOTE: If you entered your credit card information for this eVisit, you will not be charged. You may see a "hold" on your card for the $35 but that hold will drop off and you will not have a charge processed.  You need to be seen face to face today either by your PCP or you need to go to the ED!!!   If you are having a true medical emergency please call 911.     For an urgent face to face visit, [place] has five urgent care centers for your convenience:    [email or web address] to reserve your spot online an avoid wait times  [facility] [address], [address] [place], [place] [address] Modified hours of operation: Monday-Friday, 12 PM to 6 PM  Closed Saturday & Sunday  *Across the street from [place] (New Address!) [address], [place], [place] [address] *Just off [address], across the road from [place] hours of operation: Monday-Friday, 12 PM to 6 PM  Closed Saturday & Sunday   The following sites will take your insurance:  . [facility]    [phone]                  Get Driving Directions  [address], [place] [address] . 10 am to 8 pm Monday-Friday . 12 pm to 8 pm Saturday-Sunday   . [facility] at [place]                  Get Driving Directions  [address], [place], [place] [address] . 8 am to 8 pm Monday-Friday . 9 am to 6 pm Saturday . 11 am to 6 pm Sunday   . [facility] at [place]                  Get Driving Directions   [address] [place], [place] [address] . 8 am to 8 pm Monday-Friday . 8 am to 4 pm Saturday-Sunday    . [facility] at [place]                     Get Driving Directions  [phone]  [address] [place], [place] [address]  . Monday-Friday, 12 PM to 6 PM    Your e-visit answers were reviewed by a board certified advanced clinical practitioner to complete your personal care plan.  Thank you for using e-Visits.

## 2019-07-08 NOTE — ED Notes (Signed)
EMS also gave aspirin to pt en route.

## 2019-07-08 NOTE — Discharge Instructions (Addendum)
Please read instructions below. Use your albuterol inhaler every 4-6 hours as needed for shortness of breath or wheezing.  Starting tomorrow, begin taking the prednisone, as prescribed, until it is gone. Follow up with your primary care provider. Return to the ER if you have shortness of breath not improved by your inhaler, or new or concerning symptoms.  

## 2019-07-08 NOTE — ED Triage Notes (Signed)
Pt here with c/o chest pain and asthma , pt had epi , albuterol and Atrovent and solumedrol , breathing better on arrival

## 2019-07-08 NOTE — ED Provider Notes (Signed)
[facility] Provider Note   CSN: [id number] Arrival date & time: 07/08/19  1359    History   Chief Complaint Chief Complaint  Patient presents with  . Chest Pain    HPI Melissa [name] is a 33 y.o. female w PMHx of asthma,  ?stroke, HTN, chronic pain, presenting to the ED via EMS with complaint of chest pain. States that ysterday she has gradual onset of chest tightness when moving boxes. Initially intermittent and felt like a tightness like an asthma attack was coming on, now described as a prickling sensation on the left side of her chest. This morning patient went to work and noticed that humidity in the air states this usually causes worsening asthma symptoms.  She had to leave work because she felt like she was having an asthma attack.  At home she began having more of a severe asthma attack that resolved when EMS administered epi,  duoneb and solumedrol.  She feels much better on arrival to the ED.  No recent illnesses or infectious symptoms.  Per chart review, there is some mention of possible heart attack, in February of this year she went to the ED for chest pain and was noted to have ST elevations.  She had immediate catheterization which showed patent coronaries and EKG had returned to normal.  She was suspected to have pericarditis, no evidence of CAD or heart attack.     The history is provided by the patient.    Past Medical History:  Diagnosis Date  . Abnormal Pap smear 2010   Repeat pap done;Hysteroscopy;Last pap 2012;was normal  . Anemia 2010   Iron supplements PP  . Asthma    Has a nebulizer;triggered by stress  . Colon polyps   . Headache(784.0)   . Heart attack [facility](HCC)    MD thinks may have had a heart attack;Cardiologist @ [facility]  . History of CVA (cerebrovascular accident) 04/09/2013   Pt reports presumptive "stroke" when she was 33y.o.; [facility] "chronic nerve pain" and has been Rx'd Hydrocodone and  Mobic  . Infection    BV;may get frequently  . Nerve pain    Chronic;d/t possible stroke and MI  . Placenta previa antepartum 2010   Placenta moved 3 days before baby was born  . Sickle cell trait (HCC)   . Stroke [facility](HCC) 2013   MD thought may have had a stroke and heart attack;has neurologist @ [facility]    Patient Active Problem List   Diagnosis Date Noted  . New daily persistent headache 04/09/2019  . Chronic pain 03/05/2019  . Postpartum depression 03/05/2019  . ST elevation 02/18/2019  . Chest pain with high risk of acute coronary syndrome - vs. acute pericarditis 02/18/2019  . Acute pericarditis 02/18/2019  . Chest pain 02/18/2019  . Influenza A 02/11/2019  . Asthma exacerbation 02/07/2019  . Acute hypoxemic respiratory failure (HCC) 02/07/2019  . Abnormal vaginal bleeding 05/22/2016  . Mild persistent chronic asthma without complication 09/22/2015  . Laryngopharyngeal reflux (LPR)   . Dyspnea   . Physical deconditioning   . Chronic hypertension complicating or reason for care during pregnancy 11/26/2014  . Positive GBS test 11/26/2014  . Chronic pain syndrome 11/26/2014  . Obesity (BMI 30-39.9) 11/26/2014  . Essential hypertension 10/19/2013  . History of CVA (cerebrovascular accident) 04/09/2013  . Hx Endometrial polyp 02/16/2013  . Sickle cell trait (HCC) 02/16/2013  . Severe persistent asthma with allergic rhinitis with status asthmaticus  02/16/2013  . Migraines 02/16/2013    Past Surgical History:  Procedure Laterality Date  . HYSTEROSCOPY  2010  . LEFT HEART CATH AND CORONARY ANGIOGRAPHY N/A 02/18/2019   Procedure: LEFT HEART CATH AND CORONARY ANGIOGRAPHY;  Surgeon: [name] Lex, MD;  Location: [facility] INVASIVE CV LAB;  Service: Cardiovascular;  Laterality: N/A;     OB History    Gravida  3   Para  3   Term  3   Preterm      AB      Living  3     SAB      TAB      Ectopic      Multiple  0   Live Births  3             Home Medications    Prior to Admission medications   Medication Sig Start Date End Date Taking? Authorizing Provider  acetaminophen (TYLENOL) 500 MG tablet Take 500 mg by mouth every 6 (six) hours as needed for mild pain or headache.    Yes [provider]  albuterol (PROVENTIL HFA;VENTOLIN HFA) 108 (90 BASE) MCG/ACT inhaler Inhale 2 puffs into the lungs every 4 (four) hours as needed for wheezing or shortness of breath. 08/29/15  Yes Simonne Martinet, NP  albuterol (PROVENTIL) (2.5 MG/3ML) 0.083% nebulizer solution Take 2.5 mg by nebulization every 4 (four) hours as needed for wheezing or shortness of breath.   Yes [provider]  amLODipine (NORVASC) 2.5 MG tablet Take 1 tablet (2.5 mg total) by mouth daily. 02/19/19 02/14/20 Yes Berton Bon, NP  colchicine 0.6 MG tablet Take 0.5 tablets (0.3 mg total) by mouth 2 (two) times daily. 03/23/19  Yes Pricilla Riffle, MD  fluticasone [facility]) 50 MCG/ACT nasal spray Place 2 sprays into both nostrils daily. 04/09/19  Yes Arnette Felts, FNP  ibuprofen (ADVIL,MOTRIN) 200 MG tablet Take 200 mg by mouth every 6 (six) hours as needed for headache or cramping.    Yes [provider]  mometasone-formoterol (DULERA) 100-5 MCG/ACT AERO Inhale 2 puffs into the lungs 2 (two) times a day. 05/27/19  Yes Nyoka Cowden, MD  pantoprazole (PROTONIX) 40 MG tablet Take 30-60 min before last  meal of the day Patient taking differently: Take 40 mg by mouth daily. Take 30-60 min before last  meal of the day 04/20/19  Yes Nyoka Cowden, MD  predniSONE (DELTASONE) 10 MG tablet Take 2 tablets (20 mg total) by mouth daily for 5 days. 07/08/19 07/13/19  Robinson, [place] N, PA-C    Family History Family History  Problem Relation Age of Onset  . Sickle cell trait Father   . Hypertension Mother   . Anemia Mother   . Asthma Mother   . Cervical cancer Paternal Aunt   . Cervical cancer Paternal Grandmother   . Diabetes Paternal Grandmother   . Diabetes  Paternal Grandfather   . Diabetes Paternal Aunt   . Asthma Brother   . Asthma Daughter   . Asthma Son     Social History Social History   Tobacco Use  . Smoking status: Never Smoker  . Smokeless tobacco: Never Used  Substance Use Topics  . Alcohol use: Yes    Alcohol/week: 0.0 standard drinks    Comment: socially  . Drug use: No     Allergies   Ginger   Review of Systems Review of Systems  All other systems reviewed and are negative.    Physical Exam Updated  Vital Signs BP 125/76   Pulse 89   Temp 98.4 F (36.9 C) (Oral)   Resp (!) 22   SpO2 95%   Physical Exam Vitals signs and nursing note reviewed.  Constitutional:      General: She is not in acute distress.    Appearance: She is well-developed.  HENT:     Head: Normocephalic and atraumatic.     Mouth/Throat:     Mouth: Mucous membranes are moist.     Pharynx: Oropharynx is clear.  Eyes:     Conjunctiva/sclera: Conjunctivae normal.  Neck:     Musculoskeletal: Normal range of motion and neck supple.  Cardiovascular:     Rate and Rhythm: Normal rate and regular rhythm.  Pulmonary:     Effort: Pulmonary effort is normal. No respiratory distress.     Breath sounds: Wheezing (Faint expiratory wheezes) present.  Chest:     Chest wall: No tenderness.  Abdominal:     General: Bowel sounds are normal.     Palpations: Abdomen is soft.     Tenderness: There is no abdominal tenderness. There is no guarding or rebound.  Musculoskeletal:     Right lower leg: No edema.     Left lower leg: No edema.  Skin:    General: Skin is warm.  Neurological:     Mental Status: She is alert.  Psychiatric:        Behavior: Behavior normal.      ED Treatments / Results  Labs (all labs ordered are listed, but only abnormal results are displayed) Labs Reviewed  BASIC METABOLIC PANEL - Abnormal; Notable for the following components:      Result Value   Potassium 3.2 (*)    CO2 18 (*)    Glucose, Bld 123 (*)     All other components within normal limits  CBC WITH DIFFERENTIAL/PLATELET  TROPONIN I (HIGH SENSITIVITY)  TROPONIN I (HIGH SENSITIVITY)    EKG EKG Interpretation  Date/Time:  Wednesday July 08 2019 14:08:42 EDT Ventricular Rate:  102 PR Interval:    QRS Duration: 81 QT Interval:  326 QTC Calculation: 425 R Axis:   43 Text Interpretation:  Sinus tachycardia No significant change since last tracing Confirmed by Alvira MondaySchlossman, Erin ([id number]) on 07/08/2019 5:56:21 PM   Radiology Dg Chest Port 1 View  Result Date: 07/08/2019 CLINICAL DATA:  Chest pain EXAM: PORTABLE CHEST 1 VIEW COMPARISON:  02/18/2019 FINDINGS: The heart size and mediastinal contours are within normal limits. Both lungs are clear. The visualized skeletal structures are unremarkable. IMPRESSION: No active disease. Electronically Signed   By: Alcide CleverMark  Lukens M.D.   On: 07/08/2019 18:47    Procedures Procedures (including critical care time)  Medications Ordered in ED Medications - No data to display   Initial Impression / Assessment and Plan / ED Course  I have reviewed the triage vital signs and the nursing notes.  Pertinent labs & imaging results that were available during my care of the patient were reviewed by me and considered in my medical decision making (see chart for details).     Patient presenting with symptoms consistent with asthma exacerbation. On initial exam, pt with very faint wheezes and normal respiratory effort. O2 saturation is 95% on RA. Breathing treatments and steroid administered en route by EMS with resolution in symptoms. Pt w some atypical cp, neg trop x 2. Recent normal catheterization, unlikely ACS.  No signs of respiratory distress.Patient will be discharged with prednisone. Pt has been instructed to  continue using prescribed medications and to speak with PCP about today's exacerbation. Return precautions discussed. Safe for discharge.   Discussed results, findings, treatment and follow up.  Patient advised of return precautions. Patient verbalized understanding and agreed with plan.   Final Clinical Impressions(s) / ED Diagnoses   Final diagnoses:  Exacerbation of asthma, unspecified asthma severity, unspecified whether persistent  Atypical chest pain    ED Discharge Orders         Ordered    predniSONE (DELTASONE) 10 MG tablet  Daily     07/08/19 1910           Robinson, [place] N, PA-C 07/08/19 1934    Gareth Morgan, MD 07/10/19 2036

## 2019-07-08 NOTE — ED Notes (Signed)
Pt ambulated unassisted to bathroom, tolerated well

## 2019-07-12 ENCOUNTER — Other Ambulatory Visit: Payer: Self-pay | Admitting: Internal Medicine

## 2019-07-12 ENCOUNTER — Other Ambulatory Visit: Payer: Self-pay | Admitting: Nurse Practitioner

## 2019-07-12 DIAGNOSIS — G4452 New daily persistent headache (NDPH): Secondary | ICD-10-CM

## 2019-07-21 ENCOUNTER — Ambulatory Visit: Payer: BLUE CROSS/BLUE SHIELD | Admitting: Internal Medicine

## 2019-08-01 ENCOUNTER — Other Ambulatory Visit: Payer: Self-pay | Admitting: Internal Medicine

## 2019-08-01 DIAGNOSIS — G4452 New daily persistent headache (NDPH): Secondary | ICD-10-CM

## 2019-08-05 DIAGNOSIS — Z8619 Personal history of other infectious and parasitic diseases: Secondary | ICD-10-CM | POA: Diagnosis not present

## 2019-08-14 ENCOUNTER — Encounter: Payer: Self-pay | Admitting: Internal Medicine

## 2019-08-20 ENCOUNTER — Encounter: Payer: Self-pay | Admitting: Internal Medicine

## 2019-08-20 ENCOUNTER — Ambulatory Visit: Payer: BC Managed Care – PPO | Admitting: Internal Medicine

## 2019-08-20 ENCOUNTER — Other Ambulatory Visit: Payer: Self-pay

## 2019-08-20 VITALS — BP 130/70 | HR 80 | Temp 98.4°F | Ht 65.0 in | Wt 196.2 lb

## 2019-08-20 DIAGNOSIS — G894 Chronic pain syndrome: Secondary | ICD-10-CM | POA: Diagnosis not present

## 2019-08-20 DIAGNOSIS — J4530 Mild persistent asthma, uncomplicated: Secondary | ICD-10-CM | POA: Diagnosis not present

## 2019-08-20 NOTE — Progress Notes (Signed)
Subjective:     Patient ID: Melissa [name] , female    DOB: 1986/11/05 , 33 y.o.   MRN: [id number]   Chief Complaint  Patient presents with  . Hypertension    HPI She needs a FMLA note for work. She cant lift > 15 lbs, is fatigued and she cant endure 8h shift which causes her chronic nerve pain and asthma to flair. She did fine when she was on 6h shifts, and financially is able to afford it OK. She is having to also take care of her kids schooling online/ virtual from home.  She has worked 8h for the past 2 weeks.     Past Medical History:  Diagnosis Date  . Abnormal Pap smear 2010   Repeat pap done;Hysteroscopy;Last pap 2012;was normal  . Anemia 2010   Iron supplements PP  . Asthma    Has a nebulizer;triggered by stress  . Colon polyps   . Headache(784.0)   . Heart attack [facility])    MD thinks may have had a heart attack;Cardiologist @ [facility]  . History of CVA (cerebrovascular accident) 04/09/2013   Pt reports presumptive "stroke" when she was 33y.o.; followed by [facility] neurology for "chronic nerve pain" and has been Rx'd Hydrocodone and Mobic  . Infection    BV;may get frequently  . Nerve pain    Chronic;d/t possible stroke and MI  . Placenta previa antepartum 2010   Placenta moved 3 days before baby was born  . Sickle cell trait (HCC)   . Stroke [facility]) 2013   MD thought may have had a stroke and heart attack;has neurologist @ [facility]     Family History  Problem Relation Age of Onset  . Sickle cell trait Father   . Hypertension Mother   . Anemia Mother   . Asthma Mother   . Cervical cancer Paternal Aunt   . Cervical cancer Paternal Grandmother   . Diabetes Paternal Grandmother   . Diabetes Paternal Grandfather   . Diabetes Paternal Aunt   . Asthma Brother   . Asthma Daughter   . Asthma Son      Current Outpatient Medications:  .  acetaminophen (TYLENOL) 500 MG tablet, Take 500 mg by mouth every 6 (six) hours as needed for mild pain or headache. ,  Disp: , Rfl:  .  albuterol (PROVENTIL HFA;VENTOLIN HFA) 108 (90 BASE) MCG/ACT inhaler, Inhale 2 puffs into the lungs every 4 (four) hours as needed for wheezing or shortness of breath., Disp: 1 Inhaler, Rfl: 12 .  albuterol (PROVENTIL) (2.5 MG/3ML) 0.083% nebulizer solution, Take 2.5 mg by nebulization every 4 (four) hours as needed for wheezing or shortness of breath., Disp: , Rfl:  .  amLODipine (NORVASC) 2.5 MG tablet, Take 1 tablet (2.5 mg total) by mouth daily., Disp: 90 tablet, Rfl: 3 .  colchicine 0.6 MG tablet, Take 0.5 tablets (0.3 mg total) by mouth 2 (two) times daily., Disp: 30 tablet, Rfl: 1 .  fluticasone (FLONASE) 50 MCG/ACT nasal spray, SPRAY 2 SPRAYS INTO EACH NOSTRIL EVERY DAY, Disp: 48 mL, Rfl: 1 .  ibuprofen (ADVIL,MOTRIN) 200 MG tablet, Take 200 mg by mouth every 6 (six) hours as needed for headache or cramping. , Disp: , Rfl:  .  mometasone-formoterol (DULERA) 100-5 MCG/ACT AERO, Inhale 2 puffs into the lungs 2 (two) times a day., Disp: 1 Inhaler, Rfl: 0 .  pantoprazole (PROTONIX) 40 MG tablet, TAKE 30-60 MIN BEFORE LAST MEAL OF THE DAY, Disp: 90 tablet, Rfl: 0  Allergies  Allergen Reactions  . Ginger Anaphylaxis     Review of Systems  Has mild  SOB, and been having mild feet edema, the rest is neg.  Has continued following up with pulmonolgist.  Today's Vitals   08/20/19 0853  BP: 130/70  Pulse: 80  Temp: 98.4 F (36.9 C)  TempSrc: Oral  SpO2: 98%  Weight: 196 lb 3.2 oz (89 kg)  Height: 5\' 5"  (1.651 m)   Body mass index is 32.65 kg/m.   Objective:  Physical Exam   Constitutional: She is oriented to person, place, and time. She appears well-developed and well-nourished. No distress.  HENT:  Head: Normocephalic and atraumatic.  Right Ear: External ear normal.  Left Ear: External ear normal.  Nose: Nose normal.  Eyes: Conjunctivae are normal. Right eye exhibits no discharge. Left eye exhibits no discharge. No scleral icterus.  Neck: Neck supple. No  thyromegaly present.  No carotid bruits bilaterally  Cardiovascular: Normal rate and regular rhythm.  No murmur heard. Pulmonary/Chest: Effort normal and breath sounds normal. No respiratory distress.  Musculoskeletal: Normal range of motion. She exhibits no edema.  Lymphadenopathy:    She has no cervical adenopathy.  Neurological: She is alert and oriented to person, place, and time.  Skin: Skin is warm and dry. Capillary refill takes less than 2 seconds. No rash noted. She is not diaphoretic.  Psychiatric: She has a normal mood and affect. Her behavior is normal. Judgment and thought content normal.  Nursing note reviewed.  Assessment And Plan:    1. Mild persistent chronic asthma without complication- stable today, but seems to flair with increased stress and weather changes and her employer will not let her bring her neb machine to work.   2. Chronic pain syndrome- chronic.   I will place her back on 6h shifts since she tolerated this better for the next month and reassess in 1 month. She cant tolerate going back 10h shift.     Melissa Inthavong RODRIGUEZ-SOUTHWORTH, PA-C    THE PATIENT IS ENCOURAGED TO PRACTICE SOCIAL DISTANCING DUE TO THE COVID-19 PANDEMIC.

## 2019-09-17 ENCOUNTER — Encounter: Payer: Self-pay | Admitting: Internal Medicine

## 2019-09-17 ENCOUNTER — Other Ambulatory Visit: Payer: Self-pay

## 2019-09-17 ENCOUNTER — Ambulatory Visit: Payer: BC Managed Care – PPO | Admitting: Internal Medicine

## 2019-09-17 VITALS — BP 128/76 | HR 73 | Temp 98.4°F | Ht 63.6 in | Wt 201.6 lb

## 2019-09-17 DIAGNOSIS — Z23 Encounter for immunization: Secondary | ICD-10-CM

## 2019-09-17 DIAGNOSIS — G894 Chronic pain syndrome: Secondary | ICD-10-CM | POA: Diagnosis not present

## 2019-09-17 DIAGNOSIS — Z0289 Encounter for other administrative examinations: Secondary | ICD-10-CM

## 2019-09-17 DIAGNOSIS — J4530 Mild persistent asthma, uncomplicated: Secondary | ICD-10-CM

## 2019-09-17 MED ORDER — TIZANIDINE HCL 2 MG PO CAPS: 2.0000 mg | ORAL_CAPSULE | Freq: Three times a day (TID) | ORAL | 0 refills | Status: DC

## 2019-09-17 NOTE — Progress Notes (Signed)
Subjective:     Patient ID: Melissa [name] , female    DOB: 1986/03/12 , 33 y.o.   MRN: [id number]   Chief Complaint  Patient presents with  . Follow-up    To see about returning to work    HPI She is here for reassessment of ability to return to full to 8h, but wants to increase slowly. States she is stll having to do some lifting due to not having anyone else that could do it. She wants to try going ack to 8h and see how she endures this. She has noticed when she does arm things she gets L chest muscle spasm. 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 Her employer does not seem to be cooperating fully with her restrictions. She has not been able to take a day of rest every 3rd day and been working 5-6 days in a row. Due to this she is looking for another job in the mean time.    Past Medical History:  Diagnosis Date  . Abnormal Pap smear 2010   Repeat pap done;Hysteroscopy;Last pap 2012;was normal  . Anemia 2010   Iron supplements PP  . Asthma    Has a nebulizer;triggered by stress  . Colon polyps   . Headache(784.0)   . Heart attack [facility])    MD thinks may have had a heart attack;Cardiologist @ [facility]  . History of CVA (cerebrovascular accident) 04/09/2013   Pt reports presumptive "stroke" when she was 33y.o.; followed by [facility] neurology for "chronic nerve pain" and has been Rx'd Hydrocodone and Mobic  . Infection    BV;may get frequently  . Nerve pain    Chronic;d/t possible stroke and MI  . Placenta previa antepartum 2010   Placenta moved 3 days before baby was born  . Sickle cell trait (HCC)   . Stroke [facility]) 2013   MD thought may have had a stroke and heart attack;has neurologist @ [facility]     Family History  Problem Relation Age of Onset  . Sickle cell trait Father   . Hypertension Mother   . Anemia Mother   . Asthma Mother   . Cervical cancer Paternal Aunt   . Cervical cancer Paternal  Grandmother   . Diabetes Paternal Grandmother   . Diabetes Paternal Grandfather   . Diabetes Paternal Aunt   . Asthma Brother   . Asthma Daughter   . Asthma Son      Current Outpatient Medications:  .  acetaminophen (TYLENOL) 500 MG tablet, Take 500 mg by mouth every 6 (six) hours as needed for mild pain or headache. , Disp: , Rfl:  .  albuterol (PROVENTIL HFA;VENTOLIN HFA) 108 (90 BASE) MCG/ACT inhaler, Inhale 2 puffs into the lungs every 4 (four) hours as needed for wheezing or shortness of breath., Disp: 1 Inhaler, Rfl: 12 .  albuterol (PROVENTIL) (2.5 MG/3ML) 0.083% nebulizer solution, Take 2.5 mg by nebulization every 4 (four) hours as needed for wheezing or shortness of breath., Disp: , Rfl:  .  amLODipine (NORVASC) 2.5 MG tablet, Take 1 tablet (2.5 mg total) by mouth daily., Disp: 90 tablet, Rfl: 3 .  colchicine 0.6 MG tablet, Take 0.5 tablets (0.3 mg total) by mouth 2 (two) times daily., Disp: 30 tablet, Rfl: 1 .  fluticasone (FLONASE) 50 MCG/ACT nasal spray, SPRAY 2 SPRAYS INTO EACH NOSTRIL EVERY DAY, Disp: 48 mL, Rfl: 1 .  ibuprofen (ADVIL,MOTRIN)  200 MG tablet, Take 200 mg by mouth every 6 (six) hours as needed for headache or cramping. , Disp: , Rfl:  .  mometasone-formoterol (DULERA) 100-5 MCG/ACT AERO, Inhale 2 puffs into the lungs 2 (two) times a day., Disp: 1 Inhaler, Rfl: 0 .  pantoprazole (PROTONIX) 40 MG tablet, TAKE 30-60 MIN BEFORE LAST MEAL OF THE DAY, Disp: 90 tablet, Rfl: 0   Allergies  Allergen Reactions  . Ginger Anaphylaxis     Review of Systems  LMP 08/2019, emplanon placed June 2020. Denies pregnancy symptoms. Denies SOB, Chest pressure, edema. Is still stressed due to her kids doing virtual school work from home. Her asthma is doing well, though now that the weather is changing at times it feels it will flair. Denies any URI symptoms, fever, chills or GI symptoms.  Today's Vitals   09/17/19 0852  BP: 128/76  Pulse: 73  Temp: 98.4 F (36.9 C)  TempSrc:  Oral  Weight: 201 lb 9.6 oz (91.4 kg)  Height: 5' 3.6" (1.615 m)   Body mass index is 35.04 kg/m.   Objective:  Physical Exam Vitals signs and nursing note reviewed.  Constitutional:      General: She is not in acute distress.    Appearance: Normal appearance. She is not toxic-appearing.  HENT:     Head: Normocephalic.     Right Ear: External ear normal.     Left Ear: External ear normal.     Nose: Nose normal.  Eyes:     General: No scleral icterus.    Conjunctiva/sclera: Conjunctivae normal.  Neck:     Musculoskeletal: Normal range of motion and neck supple.  Cardiovascular:     Rate and Rhythm: Normal rate and regular rhythm.     Heart sounds: No murmur.  Pulmonary:     Effort: Pulmonary effort is normal.     Breath sounds: Normal breath sounds.  Chest:     Chest wall: No tenderness.  Musculoskeletal: Normal range of motion.  Lymphadenopathy:     Cervical: No cervical adenopathy.  Skin:    General: Skin is warm.     Findings: No rash.  Neurological:     General: No focal deficit present.     Mental Status: She is alert.     Gait: Gait normal.  Psychiatric:        Mood and Affect: Mood normal.        Behavior: Behavior normal.        Thought Content: Thought content normal.        Judgment: Judgment normal.     Assessment And Plan:    1. Need for influenza vaccination- routine - Flu Vaccine QUAD 6+ mos PF IM (Fluarix Quad PF)  2. Chronic pain syndrome- stable. No action  3. Mild persistent chronic asthma without complication- stable. May continue current meds  She was given a note for work to increase her hours of work for 7h, and to take a day off every 2 days. Max hours per week 35h.  Fu in 1 month for re-assessment to return to 8h.     Deuntae Kocsis RODRIGUEZ-SOUTHWORTH, PA-C    THE PATIENT IS ENCOURAGED TO PRACTICE SOCIAL DISTANCING DUE TO THE COVID-19 PANDEMIC.

## 2019-12-16 DIAGNOSIS — R52 Pain, unspecified: Secondary | ICD-10-CM | POA: Diagnosis not present

## 2019-12-16 DIAGNOSIS — N939 Abnormal uterine and vaginal bleeding, unspecified: Secondary | ICD-10-CM | POA: Diagnosis not present

## 2019-12-16 DIAGNOSIS — N921 Excessive and frequent menstruation with irregular cycle: Secondary | ICD-10-CM | POA: Diagnosis not present

## 2019-12-21 ENCOUNTER — Telehealth: Payer: Self-pay

## 2019-12-21 NOTE — Telephone Encounter (Signed)
Pt LVM for an appt, called pt back LVM for pt to call the office back when she gets a chance

## 2019-12-30 ENCOUNTER — Telehealth: Payer: Self-pay

## 2019-12-30 NOTE — Telephone Encounter (Signed)
Called pt to reschedule physical appt 01/28/2020 due to change with provider schedule vm full unable to leave message

## 2020-01-01 NOTE — L&D Delivery Note (Signed)
SVD Delivery Note   Patient Name: Melissa [name] DOB: [birth year] MRN: [id number]  Date of admission: 10/31/2020 Delivering MD: Johney Frame B  Date of delivery: 10/31/20 Type of delivery: SVD  Newborn Data: Live born female  Birth Weight:   APGAR: 9, 9  Newborn Delivery   Birth date/time: 10/31/2020 17:23:00 Delivery type: Vaginal, Spontaneous        [name], 33 y.o., @ [redacted]w[redacted]d,  [id number], who was admitted for IOL for [facility]. I was called to the room when she progressed +2 station in the second stage of labor.  She pushed for 5 min.  She delivered a viable infant, cephalic and restituted to the LOA position over an intact perineum.  A nuchal cord   was not identified. The baby was placed on maternal abdomen while initial step of NRP were perfmored (Dry, Stimulated, and warmed). Hat placed on baby for thermoregulation. Delayed cord clamping was performed until pulsation was complete, approx 2 min.  Cord double clamped and cut.  Cord cut by FOB. Apgar scores were 9 and 9. Prophylactic Pitocin was started in the third stage of labor for active management. The placenta delivered spontaneously by Binnie Kand Maneuver, shultz, with an eccentric 3 vessel cord and was sent to LD.  Inspection revealed no laceration. An examination of the vaginal vault and cervix was free from lacerations. The uterus was firm, bleeding stable.  Placenta and umbilical artery blood gas were not sent.  There were no complications during the procedure.  Mom and baby skin to skin following delivery. Left in stable condition.  Maternal Info: Anesthesia:Epidural Lacerations:  intact Est. Blood Loss (mL):   Newborn Info:  Baby Sex: female Babies Name: Sage APGAR (1 MIN): 9   APGAR (5 MINS): 9   APGAR (10 MINS):     Mom to postpartum.  Baby to Couplet care / Skin to Skin.   Gerhard Munch Taygen Acklin, CNM, NP-C 10/31/20 6:04 PM

## 2020-01-06 DIAGNOSIS — N939 Abnormal uterine and vaginal bleeding, unspecified: Secondary | ICD-10-CM | POA: Diagnosis not present

## 2020-01-28 ENCOUNTER — Encounter: Payer: BC Managed Care – PPO | Admitting: Internal Medicine

## 2020-01-29 DIAGNOSIS — N939 Abnormal uterine and vaginal bleeding, unspecified: Secondary | ICD-10-CM | POA: Diagnosis not present

## 2020-01-29 DIAGNOSIS — N926 Irregular menstruation, unspecified: Secondary | ICD-10-CM | POA: Diagnosis not present

## 2020-01-29 DIAGNOSIS — Z9889 Other specified postprocedural states: Secondary | ICD-10-CM | POA: Diagnosis not present

## 2020-01-29 DIAGNOSIS — Z3046 Encounter for surveillance of implantable subdermal contraceptive: Secondary | ICD-10-CM | POA: Diagnosis not present

## 2020-02-18 ENCOUNTER — Encounter: Payer: BC Managed Care – PPO | Admitting: Internal Medicine

## 2020-02-24 ENCOUNTER — Other Ambulatory Visit: Payer: Self-pay

## 2020-02-24 ENCOUNTER — Ambulatory Visit (INDEPENDENT_AMBULATORY_CARE_PROVIDER_SITE_OTHER): Payer: BC Managed Care – PPO | Admitting: Internal Medicine

## 2020-02-24 ENCOUNTER — Encounter: Payer: Self-pay | Admitting: Internal Medicine

## 2020-02-24 VITALS — BP 136/80 | HR 83 | Temp 98.4°F | Ht 64.6 in | Wt 202.8 lb

## 2020-02-24 DIAGNOSIS — I10 Essential (primary) hypertension: Secondary | ICD-10-CM | POA: Diagnosis not present

## 2020-02-24 DIAGNOSIS — R0789 Other chest pain: Secondary | ICD-10-CM

## 2020-02-24 DIAGNOSIS — E669 Obesity, unspecified: Secondary | ICD-10-CM | POA: Diagnosis not present

## 2020-02-24 DIAGNOSIS — D573 Sickle-cell trait: Secondary | ICD-10-CM

## 2020-02-24 DIAGNOSIS — J9601 Acute respiratory failure with hypoxia: Secondary | ICD-10-CM

## 2020-02-24 DIAGNOSIS — Z0000 Encounter for general adult medical examination without abnormal findings: Secondary | ICD-10-CM | POA: Diagnosis not present

## 2020-02-24 DIAGNOSIS — Z6834 Body mass index (BMI) 34.0-34.9, adult: Secondary | ICD-10-CM

## 2020-02-24 DIAGNOSIS — Z131 Encounter for screening for diabetes mellitus: Secondary | ICD-10-CM

## 2020-02-24 LAB — POCT URINALYSIS DIPSTICK
Bilirubin, UA: NEGATIVE
Blood, UA: NEGATIVE
Glucose, UA: NEGATIVE
Ketones, UA: NEGATIVE
Nitrite, UA: NEGATIVE
Protein, UA: NEGATIVE
Spec Grav, UA: >=1.03 — AB (ref 1.010–1.025)
Urobilinogen, UA: 0.2 E.U./dL
pH, UA: 6 (ref 5.0–8.0)

## 2020-02-24 LAB — POCT UA - MICROALBUMIN
Albumin/Creatinine Ratio, Urine, POC: 30
Creatinine, POC: 300 mg/dL
Microalbumin Ur, POC: 30 mg/L

## 2020-02-24 NOTE — Progress Notes (Signed)
This visit occurred during the SARS-CoV-2 public health emergency.  Safety protocols were in place, including screening questions prior to the visit, additional usage of staff PPE, and extensive cleaning of exam room while observing appropriate contact time as indicated for disinfecting solutions.  Subjective:     Patient ID: Melissa [name] , female    DOB: 1986/10/16 , 34 y.o.   MRN: [id number]   Chief Complaint  Patient presents with  . Annual Exam    HPI Pt had pap last month and had removal her nexplanon, partner uses condoms. Her pap was nl last month. She knows she needs to do better with her diet and work on wt loss.  She has not been taking her Amlodipine for the past 11/2 weeks which is prescribed by her cardiologist.    Past Medical History:  Diagnosis Date  . Abnormal Pap smear 2010   Repeat pap done;Hysteroscopy;Last pap 2012;was normal  . Anemia 2010   Iron supplements PP  . Asthma    Has a nebulizer;triggered by stress  . Colon polyps   . Headache(784.0)   . Heart attack [facility])    MD thinks may have had a heart attack;Cardiologist @ [facility]  . History of CVA (cerebrovascular accident) 04/09/2013   Pt reports presumptive "stroke" when she was 34y.o.; followed by [facility] neurology for "chronic nerve pain" and has been Rx'd Hydrocodone and Mobic  . Infection    BV;may get frequently  . Nerve pain    Chronic;d/t possible stroke and MI  . Placenta previa antepartum 2010   Placenta moved 3 days before baby was born  . Sickle cell trait ([place])   . Stroke [facility]) 2013   MD thought may have had a stroke and heart attack;has neurologist @ [facility]     Family History  Problem Relation Age of Onset  . Sickle cell trait Father   . Hypertension Mother   . Anemia Mother   . Asthma Mother   . Cervical cancer Paternal Aunt   . Cervical cancer Paternal Grandmother   . Diabetes Paternal Grandmother   . Diabetes Paternal Grandfather   . Diabetes Paternal Aunt   .  Asthma Brother   . Asthma Daughter   . Asthma Son      Current Outpatient Medications:  .  acetaminophen (TYLENOL) 500 MG tablet, Take 500 mg by mouth every 6 (six) hours as needed for mild pain or headache. , Disp: , Rfl:  .  albuterol (PROVENTIL HFA;VENTOLIN HFA) 108 (90 BASE) MCG/ACT inhaler, Inhale 2 puffs into the lungs every 4 (four) hours as needed for wheezing or shortness of breath., Disp: 1 Inhaler, Rfl: 12 .  albuterol (PROVENTIL) (2.5 MG/3ML) 0.083% nebulizer solution, Take 2.5 mg by nebulization every 4 (four) hours as needed for wheezing or shortness of breath., Disp: , Rfl:  .  colchicine 0.6 MG tablet, Take 0.5 tablets (0.3 mg total) by mouth 2 (two) times daily., Disp: 30 tablet, Rfl: 1 .  fluticasone (FLONASE) 50 MCG/ACT nasal spray, SPRAY 2 SPRAYS INTO EACH NOSTRIL EVERY DAY, Disp: 48 mL, Rfl: 1 .  ibuprofen (ADVIL,MOTRIN) 200 MG tablet, Take 200 mg by mouth every 6 (six) hours as needed for headache or cramping. , Disp: , Rfl:  .  mometasone-formoterol (DULERA) 100-5 MCG/ACT AERO, Inhale 2 puffs into the lungs 2 (two) times a day., Disp: 1 Inhaler, Rfl: 0 .  pantoprazole (PROTONIX) 40 MG tablet, TAKE 30-60 MIN BEFORE LAST MEAL OF THE DAY, Disp: 90 tablet,  Rfl: 0 .  tizanidine (ZANAFLEX) 2 MG capsule, Take 1 capsule (2 mg total) by mouth 3 (three) times daily. Prn chest muscle spasms, Disp: 30 capsule, Rfl: 0 .  amLODipine (NORVASC) 2.5 MG tablet, Take 1 tablet (2.5 mg total) by mouth daily., Disp: 90 tablet, Rfl: 3   Allergies  Allergen Reactions  . Ginger Anaphylaxis     Review of Systems  Abnormal uterine bleeding has resolved since explanon removal last month. LMP 3 weeks ago. Had asthma attack 2 weeks ago and had to use her nebs and is fine now. Has been out of her amlodipine 11/2 weeks. Gets chest pains on occasion decreabed as quick stabbing pains and taking a breath or moving makes it worse, but denies sweating and nausea together. Sometimes gets nausea with the  pains. May lasts from a few seconds to 5-7 minutes. Then tries to relax. This has been going on x 2 months. Nothing provokes this or make it better. Deneis association with foods and denies GERD. The rest of 10 point ROS is neg Today's Vitals   02/24/20 0847  BP: 136/80  Pulse: 83  Temp: 98.4 F (36.9 C)  Weight: 202 lb 12.8 oz (92 kg)  Height: 5' 4.6" (1.641 m)  PainSc: 0-No pain   Body mass index is 34.17 kg/m.   Objective:  Physical Exam  BP 136/80 (BP Location: Right Arm, Patient Position: Sitting, Cuff Size: Normal)   Pulse 83   Temp 98.4 F (36.9 C)   Ht 5' 4.6" (1.641 m)   Wt 202 lb 12.8 oz (92 kg)   BMI 34.17 kg/m   General Appearance:    Alert, cooperative, no distress, appears stated age  Head:    Normocephalic, without obvious abnormality, atraumatic  Eyes:    PERRL, conjunctiva/corneas clear, EOM's intact, fundi    benign, both eyes  Ears:    Normal TM's and external ear canals, both ears  Nose:   Nares normal, septum midline, mucosa normal, no drainage    or sinus tenderness  Throat:   Lips, mucosa, and tongue normal; teeth and gums normal  Neck:   Supple, symmetrical, trachea midline, no adenopathy;    thyroid:  no enlargement/tenderness/nodules; no carotid   bruit or JVD  Back:     Symmetric, no curvature, ROM normal, no CVA tenderness  Lungs:     Clear to auscultation bilaterally, respirations unlabored  Chest Wall:    No tenderness or deformity   Heart:    Regular rate and rhythm, S1 and S2 normal, no murmur, rub   or gallop     Abdomen:     Soft, non-tender, bowel sounds active all four quadrants,    no masses, no organomegaly        Extremities:   Extremities normal, atraumatic, no cyanosis or edema  Pulses:   2+ and symmetric all extremities. Hip ROM is normal and did not provoke any pain with movement.   Skin:   Skin color, texture, turgor normal, no rashes or lesions  Lymph nodes:   Cervical, supraclavicular, and axillary nodes normal   Neurologic:   CNII-XII intact, normal strength, sensation and reflexes    Throughout. Normal Romberg, tandem gait, finger to nose, heel to toe gait.       EKG- sinus rhythm. WNR Assessment And Plan:     1. Encounter for annual physical exam- routine. FU 1 y - POCT Urinalysis Dipstick (81002) - POCT UA - Microalbumin - EKG 12-Lead  2. Essential  hypertension- controlled. Advised to do diaries and call her cardiologist to see if OK for her to stay off this.  FU 3 months - POCT Urinalysis Dipstick (81002) - POCT UA - Microalbumin - EKG 12-Lead - Amb Ref to Medical Weight Management - CMP14 + Anion Gap - CBC no Diff - Lipid Profile  3. Obesity (BMI 30-39.9)- chronic.  - Amb Ref to Medical Weight Management - TSH - Lipid Profile  4. Screening for diabetes mellitus- screen - Hemoglobin A1c We will inform her when her labs are back.   Karyna Bessler RODRIGUEZ-SOUTHWORTH, PA-C    THE PATIENT IS ENCOURAGED TO PRACTICE SOCIAL DISTANCING DUE TO THE COVID-19 PANDEMIC.

## 2020-02-24 NOTE — Patient Instructions (Signed)
Preventive Care 21-33 Years Old, Female Preventive care refers to visits with your health care provider and lifestyle choices that can promote health and wellness. This includes:  A yearly physical exam. This may also be called an annual well check.  Regular dental visits and eye exams.  Immunizations.  Screening for certain conditions.  Healthy lifestyle choices, such as eating a healthy diet, getting regular exercise, not using drugs or products that contain nicotine and tobacco, and limiting alcohol use. What can I expect for my preventive care visit? Physical exam Your health care provider will check your:  Height and weight. This may be used to calculate body mass index (BMI), which tells if you are at a healthy weight.  Heart rate and blood pressure.  Skin for abnormal spots. Counseling Your health care provider may ask you questions about your:  Alcohol, tobacco, and drug use.  Emotional well-being.  Home and relationship well-being.  Sexual activity.  Eating habits.  Work and work environment.  Method of birth control.  Menstrual cycle.  Pregnancy history. What immunizations do I need?  Influenza (flu) vaccine  This is recommended every year. Tetanus, diphtheria, and pertussis (Tdap) vaccine  You may need a Td booster every 10 years. Varicella (chickenpox) vaccine  You may need this if you have not been vaccinated. Human papillomavirus (HPV) vaccine  If recommended by your health care provider, you may need three doses over 6 months. Measles, mumps, and rubella (MMR) vaccine  You may need at least one dose of MMR. You may also need a second dose. Meningococcal conjugate (MenACWY) vaccine  One dose is recommended if you are age 19-21 years and a first-year college student living in a residence hall, or if you have one of several medical conditions. You may also need additional booster doses. Pneumococcal conjugate (PCV13) vaccine  You may need  this if you have certain conditions and were not previously vaccinated. Pneumococcal polysaccharide (PPSV23) vaccine  You may need one or two doses if you smoke cigarettes or if you have certain conditions. Hepatitis A vaccine  You may need this if you have certain conditions or if you travel or work in places where you may be exposed to hepatitis A. Hepatitis B vaccine  You may need this if you have certain conditions or if you travel or work in places where you may be exposed to hepatitis B. Haemophilus influenzae type b (Hib) vaccine  You may need this if you have certain conditions. You may receive vaccines as individual doses or as more than one vaccine together in one shot (combination vaccines). Talk with your health care provider about the risks and benefits of combination vaccines. What tests do I need?  Blood tests  Lipid and cholesterol levels. These may be checked every 5 years starting at age 20.  Hepatitis C test.  Hepatitis B test. Screening  Diabetes screening. This is done by checking your blood sugar (glucose) after you have not eaten for a while (fasting).  Sexually transmitted disease (STD) testing.  BRCA-related cancer screening. This may be done if you have a family history of breast, ovarian, tubal, or peritoneal cancers.  Pelvic exam and Pap test. This may be done every 3 years starting at age 21. Starting at age 30, this may be done every 5 years if you have a Pap test in combination with an HPV test. Talk with your health care provider about your test results, treatment options, and if necessary, the need for more tests.   Follow these instructions at home: Eating and drinking   Eat a diet that includes fresh fruits and vegetables, whole grains, lean protein, and low-fat dairy.  Take vitamin and mineral supplements as recommended by your health care provider.  Do not drink alcohol if: ? Your health care provider tells you not to drink. ? You are  pregnant, may be pregnant, or are planning to become pregnant.  If you drink alcohol: ? Limit how much you have to 0-1 drink a day. ? Be aware of how much alcohol is in your drink. In the [place] one drink equals one 12 oz bottle of beer (355 mL), one 5 oz glass of wine (148 mL), or one 1 oz glass of hard liquor (44 mL). Lifestyle  Take daily care of your teeth and gums.  Stay active. Exercise for at least 30 minutes on 5 or more days each week.  Do not use any products that contain nicotine or tobacco, such as cigarettes, e-cigarettes, and chewing tobacco. If you need help quitting, ask your health care provider.  If you are sexually active, practice safe sex. Use a condom or other form of birth control (contraception) in order to prevent pregnancy and STIs (sexually transmitted infections). If you plan to become pregnant, see your health care provider for a preconception visit. What's next?  Visit your health care provider once a year for a well check visit.  Ask your health care provider how often you should have your eyes and teeth checked.  Stay up to date on all vaccines. This information is not intended to replace advice given to you by your health care provider. Make sure you discuss any questions you have with your health care provider. Document Revised: 08/28/2018 Document Reviewed: 08/28/2018 Elsevier Patient Education  2020 [employer].

## 2020-02-25 ENCOUNTER — Encounter: Payer: Self-pay | Admitting: Internal Medicine

## 2020-02-25 LAB — LIPID PANEL
Chol/HDL Ratio: 2.4 ratio (ref 0.0–4.4)
Cholesterol, Total: 139 mg/dL (ref 100–199)
HDL: 57 mg/dL (ref >39)
LDL Chol Calc (NIH): 72 mg/dL (ref 0–99)
Triglycerides: 44 mg/dL (ref 0–149)
VLDL Cholesterol Cal: 10 mg/dL (ref 5–40)

## 2020-02-25 LAB — CMP14 + ANION GAP
ALT: 31 IU/L (ref 0–32)
AST: 26 IU/L (ref 0–40)
Albumin/Globulin Ratio: 1.3 (ref 1.2–2.2)
Albumin: 4.3 g/dL (ref 3.8–4.8)
Alkaline Phosphatase: 78 IU/L (ref 39–117)
Anion Gap: 13 mmol/L (ref 10.0–18.0)
BUN/Creatinine Ratio: 13 (ref 9–23)
BUN: 11 mg/dL (ref 6–20)
Bilirubin Total: 0.8 mg/dL (ref 0.0–1.2)
CO2: 21 mmol/L (ref 20–29)
Calcium: 9.6 mg/dL (ref 8.7–10.2)
Chloride: 104 mmol/L (ref 96–106)
Creatinine, Ser: 0.85 mg/dL (ref 0.57–1.00)
GFR calc Af Amer: 105 mL/min/{1.73_m2} (ref >59)
GFR calc non Af Amer: 91 mL/min/{1.73_m2} (ref >59)
Globulin, Total: 3.2 g/dL (ref 1.5–4.5)
Glucose: 89 mg/dL (ref 65–99)
Potassium: 4.3 mmol/L (ref 3.5–5.2)
Sodium: 138 mmol/L (ref 134–144)
Total Protein: 7.5 g/dL (ref 6.0–8.5)

## 2020-02-25 LAB — CBC
Hematocrit: 40.5 % (ref 34.0–46.6)
Hemoglobin: 13.3 g/dL (ref 11.1–15.9)
MCH: 28 pg (ref 26.6–33.0)
MCHC: 32.8 g/dL (ref 31.5–35.7)
MCV: 85 fL (ref 79–97)
Platelets: 222 10*3/uL (ref 150–450)
RBC: 4.75 x10E6/uL (ref 3.77–5.28)
RDW: 13.7 % (ref 11.7–15.4)
WBC: 5.8 10*3/uL (ref 3.4–10.8)

## 2020-02-25 LAB — TSH: TSH: 0.719 u[IU]/mL (ref 0.450–4.500)

## 2020-02-25 LAB — HEMOGLOBIN A1C
Est. average glucose Bld gHb Est-mCnc: 111 mg/dL
Hgb A1c MFr Bld: 5.5 % (ref 4.8–5.6)

## 2020-02-29 ENCOUNTER — Encounter: Payer: Self-pay | Admitting: Internal Medicine

## 2020-03-17 ENCOUNTER — Encounter (HOSPITAL_COMMUNITY): Payer: Self-pay | Admitting: Emergency Medicine

## 2020-03-17 ENCOUNTER — Emergency Department (HOSPITAL_COMMUNITY)
Admission: EM | Admit: 2020-03-17 | Discharge: 2020-03-17 | Disposition: A | Discharge status: Home / Self Care | Payer: BC Managed Care – PPO | Attending: Emergency Medicine | Admitting: Emergency Medicine

## 2020-03-17 ENCOUNTER — Other Ambulatory Visit: Payer: Self-pay

## 2020-03-17 DIAGNOSIS — I10 Essential (primary) hypertension: Secondary | ICD-10-CM | POA: Diagnosis not present

## 2020-03-17 DIAGNOSIS — F10920 Alcohol use, unspecified with intoxication, uncomplicated: Secondary | ICD-10-CM | POA: Insufficient documentation

## 2020-03-17 DIAGNOSIS — Z79899 Other long term (current) drug therapy: Secondary | ICD-10-CM | POA: Insufficient documentation

## 2020-03-17 DIAGNOSIS — R0902 Hypoxemia: Secondary | ICD-10-CM | POA: Diagnosis not present

## 2020-03-17 DIAGNOSIS — J45909 Unspecified asthma, uncomplicated: Secondary | ICD-10-CM | POA: Insufficient documentation

## 2020-03-17 DIAGNOSIS — R112 Nausea with vomiting, unspecified: Secondary | ICD-10-CM | POA: Insufficient documentation

## 2020-03-17 DIAGNOSIS — R000 Tachycardia, unspecified: Secondary | ICD-10-CM | POA: Diagnosis not present

## 2020-03-17 DIAGNOSIS — F10929 Alcohol use, unspecified with intoxication, unspecified: Secondary | ICD-10-CM | POA: Diagnosis not present

## 2020-03-17 MED ORDER — FAMOTIDINE IN NACL 20-0.9 MG/50ML-% IV SOLN
20.0000 mg | Freq: Once | INTRAVENOUS | Status: AC
  Administered 2020-03-17: 20 mg via INTRAVENOUS
  Filled 2020-03-17: qty 50

## 2020-03-17 MED ORDER — PROMETHAZINE HCL 25 MG/ML IJ SOLN
12.5000 mg | INTRAMUSCULAR | Status: AC
  Administered 2020-03-17: 12.5 mg via INTRAVENOUS
  Filled 2020-03-17: qty 1

## 2020-03-17 MED ORDER — KETOROLAC TROMETHAMINE 30 MG/ML IJ SOLN
15.0000 mg | Freq: Once | INTRAMUSCULAR | Status: AC
  Administered 2020-03-17: 15 mg via INTRAVENOUS
  Filled 2020-03-17: qty 1

## 2020-03-17 MED ORDER — SODIUM CHLORIDE 0.9 % IV BOLUS
1000.0000 mL | Freq: Once | INTRAVENOUS | Status: AC
  Administered 2020-03-17: 1000 mL via INTRAVENOUS

## 2020-03-17 MED ORDER — ONDANSETRON 4 MG PO TBDP: 4.0000 mg | ORAL_TABLET | Freq: Three times a day (TID) | ORAL | 0 refills | Status: DC | PRN

## 2020-03-17 NOTE — ED Triage Notes (Signed)
Patient here from home with complaints of ETOH, n/v after drinking Tequila. 4mg  zofran given.

## 2020-03-17 NOTE — ED Provider Notes (Signed)
[facility]-EMERGENCY DEPT Provider Note   CSN: [id number] Arrival date & time: 03/17/20  0147     History Chief Complaint  Patient presents with  . Nausea  . Emesis  . Alcohol Intoxication    Melissa [name] is a 34 y.o. female.  34 year old female presents to the emergency department for valuation of acute intoxication.  Patient with nausea and vomiting tonight after drinking tequila.  She was given 4 mg Zofran by EMS.  Complains of persistent nausea, but has not had any vomiting since ED arrival.  Denies any other coingestions  The history is provided by the patient. No language interpreter was used.  Emesis Alcohol Intoxication       Past Medical History:  Diagnosis Date  . Abnormal Pap smear 2010   Repeat pap done;Hysteroscopy;Last pap 2012;was normal  . Anemia 2010   Iron supplements PP  . Asthma    Has a nebulizer;triggered by stress  . Colon polyps   . Headache(784.0)   . Heart attack [facility])    MD thinks may have had a heart attack;Cardiologist @ [facility]  . History of CVA (cerebrovascular accident) 04/09/2013   Pt reports presumptive "stroke" when she was 34y.o.; followed by [facility] neurology for "chronic nerve pain" and has been Rx'd Hydrocodone and Mobic  . Infection    BV;may get frequently  . Nerve pain    Chronic;d/t possible stroke and MI  . Placenta previa antepartum 2010   Placenta moved 3 days before baby was born  . Sickle cell trait (HCC)   . Stroke [facility]) 2013   MD thought may have had a stroke and heart attack;has neurologist @ [facility]    Patient Active Problem List   Diagnosis Date Noted  . New daily persistent headache 04/09/2019  . Chronic pain 03/05/2019  . Postpartum depression 03/05/2019  . ST elevation 02/18/2019  . Chest pain with high risk of acute coronary syndrome - vs. acute pericarditis 02/18/2019  . Acute pericarditis 02/18/2019  . Chest pain 02/18/2019  . Influenza A 02/11/2019  . Asthma  exacerbation 02/07/2019  . Acute hypoxemic respiratory failure (HCC) 02/07/2019  . Abnormal vaginal bleeding 05/22/2016  . Mild persistent chronic asthma without complication 09/22/2015  . Laryngopharyngeal reflux (LPR)   . Dyspnea   . Physical deconditioning   . Chronic hypertension complicating or reason for care during pregnancy 11/26/2014  . Positive GBS test 11/26/2014  . Chronic pain syndrome 11/26/2014  . Obesity (BMI 30-39.9) 11/26/2014  . Essential hypertension 10/19/2013  . History of CVA (cerebrovascular accident) 04/09/2013  . Hx Endometrial polyp 02/16/2013  . Sickle cell trait (HCC) 02/16/2013  . Severe persistent asthma with allergic rhinitis with status asthmaticus 02/16/2013  . Migraines 02/16/2013    Past Surgical History:  Procedure Laterality Date  . HYSTEROSCOPY  2010  . LEFT HEART CATH AND CORONARY ANGIOGRAPHY N/A 02/18/2019   Procedure: LEFT HEART CATH AND CORONARY ANGIOGRAPHY;  Surgeon: [name] Lex, MD;  Location: [facility] INVASIVE CV LAB;  Service: Cardiovascular;  Laterality: N/A;     OB History    Gravida  3   Para  3   Term  3   Preterm      AB      Living  3     SAB      TAB      Ectopic      Multiple  0   Live Births  3  Family History  Problem Relation Age of Onset  . Sickle cell trait Father   . Hypertension Mother   . Anemia Mother   . Asthma Mother   . Cervical cancer Paternal Aunt   . Cervical cancer Paternal Grandmother   . Diabetes Paternal Grandmother   . Diabetes Paternal Grandfather   . Diabetes Paternal Aunt   . Asthma Brother   . Asthma Daughter   . Asthma Son     Social History   Tobacco Use  . Smoking status: Never Smoker  . Smokeless tobacco: Never Used  Substance Use Topics  . Alcohol use: Yes    Alcohol/week: 0.0 standard drinks    Comment: socially  . Drug use: No    Home Medications Prior to Admission medications   Medication Sig Start Date End Date Taking? Authorizing Provider   albuterol (PROVENTIL) (2.5 MG/3ML) 0.083% nebulizer solution Take 2.5 mg by nebulization every 4 (four) hours as needed for wheezing or shortness of breath.   Yes [provider]  amLODipine (NORVASC) 2.5 MG tablet Take 1 tablet (2.5 mg total) by mouth daily. 02/19/19 03/17/20 Yes Daune Perch, NP  estradiol (ESTRACE) 1 MG tablet Take 1 mg by mouth See admin instructions. 1 tablet for 14 days of every month   Yes [provider]  fluticasone (FLONASE) 50 MCG/ACT nasal spray SPRAY 2 SPRAYS INTO EACH NOSTRIL EVERY DAY Patient taking differently: Place 2 sprays into both nostrils daily as needed for allergies or rhinitis.  08/03/19  Yes Rodriguez-Southworth, Sunday Spillers, PA-C  mometasone-formoterol (DULERA) 100-5 MCG/ACT AERO Inhale 2 puffs into the lungs 2 (two) times a day. Patient taking differently: Inhale 2 puffs into the lungs 2 (two) times daily as needed for wheezing.  05/27/19  Yes Tanda Rockers, MD  tizanidine (ZANAFLEX) 2 MG capsule Take 1 capsule (2 mg total) by mouth 3 (three) times daily. Prn chest muscle spasms Patient taking differently: Take 2 mg by mouth 3 (three) times daily as needed for muscle spasms.  09/17/19  Yes Rodriguez-Southworth, Sunday Spillers, PA-C  albuterol (PROVENTIL HFA;VENTOLIN HFA) 108 (90 BASE) MCG/ACT inhaler Inhale 2 puffs into the lungs every 4 (four) hours as needed for wheezing or shortness of breath. 08/29/15   Erick Colace, NP  colchicine 0.6 MG tablet Take 0.5 tablets (0.3 mg total) by mouth 2 (two) times daily. Patient not taking: Reported on 03/17/2020 03/23/19   Fay Records, MD  ondansetron (ZOFRAN ODT) 4 MG disintegrating tablet Take 1 tablet (4 mg total) by mouth every 8 (eight) hours as needed for nausea or vomiting. 03/17/20   Antonietta Breach, PA-C  pantoprazole (PROTONIX) 40 MG tablet TAKE 30-60 MIN BEFORE LAST MEAL OF THE DAY Patient not taking: Reported on 03/17/2020 07/13/19   Tanda Rockers, MD    Allergies    Ginger  Review of Systems     Review of Systems  Gastrointestinal: Positive for vomiting.  Ten systems reviewed and are negative for acute change, except as noted in the HPI.    Physical Exam Updated Vital Signs BP 120/79   Pulse (!) 109   Temp 97.9 F (36.6 C) (Oral)   Resp (!) 24   Ht 5\' 5"  (1.651 m)   Wt 90.7 kg   SpO2 96%   BMI 33.28 kg/m   Physical Exam Vitals and nursing note reviewed.  Constitutional:      General: She is not in acute distress.    Appearance: She is well-developed. She is not diaphoretic.  Comments: Obese AA female. Patient in NAD.  HENT:     Head: Normocephalic and atraumatic.  Eyes:     General: No scleral icterus.    Conjunctiva/sclera: Conjunctivae normal.  Pulmonary:     Effort: Pulmonary effort is normal. No respiratory distress.     Comments: Respirations even and unlabored Musculoskeletal:        General: Normal range of motion.     Cervical back: Normal range of motion.  Skin:    General: Skin is warm and dry.     Coloration: Skin is not pale.     Findings: No erythema or rash.  Neurological:     General: No focal deficit present.     Mental Status: She is alert and oriented to person, place, and time.     Coordination: Coordination normal.     Comments: Able to self transition in exam room bed. GCS 15 and patient A&Ox4.  Psychiatric:        Behavior: Behavior normal.     ED Results / Procedures / Treatments   Labs (all labs ordered are listed, but only abnormal results are displayed) Labs Reviewed - No data to display  EKG None  Radiology No results found.  Procedures Procedures (including critical care time)  Medications Ordered in ED Medications  ketorolac (TORADOL) 30 MG/ML injection 15 mg (has no administration in time range)  promethazine (PHENERGAN) injection 12.5 mg (12.5 mg Intravenous Given 03/17/20 0328)  sodium chloride 0.9 % bolus 1,000 mL (1,000 mLs Intravenous New Bag/Given 03/17/20 0328)  famotidine (PEPCID) IVPB 20 mg premix  (0 mg Intravenous Stopped 03/17/20 0353)    ED Course  I have reviewed the triage vital signs and the nursing notes.  Pertinent labs & imaging results that were available during my care of the patient were reviewed by me and considered in my medical decision making (see chart for details).  Clinical Course as of Mar 17 509  Thu Mar 17, 2020  0510 Patient is alert, easily arousable.  Vital signs stable.  She has not had any subsequent vomiting.  Speech is clear.  Feels she is appropriate for discharge and continued outpatient sobering.  Toradol ordered for complaints of abdominal discomfort.   [KH]    Clinical Course User Index [KH] Darylene Price   MDM Rules/Calculators/A&P                      34 year old female presenting to the emergency department for uncomplicated acute intoxication.  She has been monitored in the emergency department for a number of hours without clinical decompensation.  Given antiemetics as well as Pepcid for management of nausea.  Toradol ordered for abdominal pain, likely secondary to vomiting.  She has been encouraged to follow-up with a primary care doctor as needed.  Will give short course of Zofran for outpatient symptom control.  Return precautions discussed and provided. Patient discharged in stable condition with no unaddressed concerns.   Final Clinical Impression(s) / ED Diagnoses Final diagnoses:  Alcoholic intoxication without complication [facility])    Rx / DC Orders ED Discharge Orders         Ordered    ondansetron (ZOFRAN ODT) 4 MG disintegrating tablet  Every 8 hours PRN     03/17/20 0510           Antony Madura, PA-C 03/17/20 0511    Molpus, Jonny Ruiz, MD 03/17/20 [phone]

## 2020-03-18 ENCOUNTER — Emergency Department (HOSPITAL_COMMUNITY)
Admission: EM | Admit: 2020-03-18 | Discharge: 2020-03-19 | Disposition: A | Discharge status: Home / Self Care | Payer: BC Managed Care – PPO | Attending: Emergency Medicine | Admitting: Emergency Medicine

## 2020-03-18 ENCOUNTER — Encounter (HOSPITAL_COMMUNITY): Payer: Self-pay

## 2020-03-18 ENCOUNTER — Other Ambulatory Visit: Payer: Self-pay

## 2020-03-18 DIAGNOSIS — Z8673 Personal history of transient ischemic attack (TIA), and cerebral infarction without residual deficits: Secondary | ICD-10-CM | POA: Diagnosis not present

## 2020-03-18 DIAGNOSIS — O219 Vomiting of pregnancy, unspecified: Secondary | ICD-10-CM | POA: Diagnosis not present

## 2020-03-18 DIAGNOSIS — O99511 Diseases of the respiratory system complicating pregnancy, first trimester: Secondary | ICD-10-CM | POA: Diagnosis not present

## 2020-03-18 DIAGNOSIS — O99011 Anemia complicating pregnancy, first trimester: Secondary | ICD-10-CM | POA: Insufficient documentation

## 2020-03-18 DIAGNOSIS — Z79899 Other long term (current) drug therapy: Secondary | ICD-10-CM | POA: Insufficient documentation

## 2020-03-18 DIAGNOSIS — Z3A01 Less than 8 weeks gestation of pregnancy: Secondary | ICD-10-CM | POA: Diagnosis not present

## 2020-03-18 DIAGNOSIS — Z3491 Encounter for supervision of normal pregnancy, unspecified, first trimester: Secondary | ICD-10-CM

## 2020-03-18 DIAGNOSIS — R1030 Lower abdominal pain, unspecified: Secondary | ICD-10-CM | POA: Diagnosis not present

## 2020-03-18 DIAGNOSIS — Z3A00 Weeks of gestation of pregnancy not specified: Secondary | ICD-10-CM | POA: Diagnosis not present

## 2020-03-18 DIAGNOSIS — O99891 Other specified diseases and conditions complicating pregnancy: Secondary | ICD-10-CM | POA: Diagnosis not present

## 2020-03-18 DIAGNOSIS — D573 Sickle-cell trait: Secondary | ICD-10-CM | POA: Diagnosis not present

## 2020-03-18 DIAGNOSIS — J45909 Unspecified asthma, uncomplicated: Secondary | ICD-10-CM | POA: Insufficient documentation

## 2020-03-18 DIAGNOSIS — O10011 Pre-existing essential hypertension complicating pregnancy, first trimester: Secondary | ICD-10-CM | POA: Diagnosis not present

## 2020-03-18 DIAGNOSIS — R112 Nausea with vomiting, unspecified: Secondary | ICD-10-CM

## 2020-03-18 NOTE — ED Triage Notes (Signed)
Patient arrived by POV reports abdominal pain, nausea, and vomiting since yesterday. Patient seen here yesterday in this ED for same.

## 2020-03-19 LAB — CBC WITH DIFFERENTIAL/PLATELET
Abs Immature Granulocytes: 0.02 10*3/uL (ref 0.00–0.07)
Basophils Absolute: 0 10*3/uL (ref 0.0–0.1)
Basophils Relative: 0 %
Eosinophils Absolute: 0.2 10*3/uL (ref 0.0–0.5)
Eosinophils Relative: 3 %
HCT: 37.9 % (ref 36.0–46.0)
Hemoglobin: 12.3 g/dL (ref 12.0–15.0)
Immature Granulocytes: 0 %
Lymphocytes Relative: 29 %
Lymphs Abs: 1.9 10*3/uL (ref 0.7–4.0)
MCH: 28.9 pg (ref 26.0–34.0)
MCHC: 32.5 g/dL (ref 30.0–36.0)
MCV: 89 fL (ref 80.0–100.0)
Monocytes Absolute: 0.4 10*3/uL (ref 0.1–1.0)
Monocytes Relative: 6 %
Neutro Abs: 4.2 10*3/uL (ref 1.7–7.7)
Neutrophils Relative %: 62 %
Platelets: 182 10*3/uL (ref 150–400)
RBC: 4.26 MIL/uL (ref 3.87–5.11)
RDW: 14.6 % (ref 11.5–15.5)
WBC: 6.8 10*3/uL (ref 4.0–10.5)
nRBC: 0 % (ref 0.0–0.2)

## 2020-03-19 LAB — COMPREHENSIVE METABOLIC PANEL
ALT: 17 U/L (ref 0–44)
AST: 19 U/L (ref 15–41)
Albumin: 3.5 g/dL (ref 3.5–5.0)
Alkaline Phosphatase: 47 U/L (ref 38–126)
Anion gap: 8 (ref 5–15)
BUN: 7 mg/dL (ref 6–20)
CO2: 21 mmol/L — ABNORMAL LOW (ref 22–32)
Calcium: 8.6 mg/dL — ABNORMAL LOW (ref 8.9–10.3)
Chloride: 108 mmol/L (ref 98–111)
Creatinine, Ser: 0.75 mg/dL (ref 0.44–1.00)
GFR calc Af Amer: >60 mL/min (ref >60)
GFR calc non Af Amer: >60 mL/min (ref >60)
Glucose, Bld: 99 mg/dL (ref 70–99)
Potassium: 3.6 mmol/L (ref 3.5–5.1)
Sodium: 137 mmol/L (ref 135–145)
Total Bilirubin: 1.3 mg/dL — ABNORMAL HIGH (ref 0.3–1.2)
Total Protein: 6.8 g/dL (ref 6.5–8.1)

## 2020-03-19 LAB — I-STAT BETA HCG BLOOD, ED (MC, WL, AP ONLY): I-stat hCG, quantitative: >2000 m[IU]/mL — ABNORMAL HIGH (ref <5)

## 2020-03-19 LAB — ETHANOL: Alcohol, Ethyl (B): <10 mg/dL (ref <10)

## 2020-03-19 LAB — HCG, QUANTITATIVE, PREGNANCY: hCG, Beta Chain, Quant, S: 37129 m[IU]/mL — ABNORMAL HIGH (ref <5)

## 2020-03-19 LAB — LIPASE, BLOOD: Lipase: 18 U/L (ref 11–51)

## 2020-03-19 MED ORDER — DOXYLAMINE-PYRIDOXINE 10-10 MG PO TBEC: 1.0000 | DELAYED_RELEASE_TABLET | Freq: Two times a day (BID) | ORAL | 0 refills | Status: DC | PRN

## 2020-03-19 MED ORDER — ONDANSETRON HCL 4 MG/2ML IJ SOLN
4.0000 mg | Freq: Once | INTRAMUSCULAR | Status: AC
  Administered 2020-03-19: 4 mg via INTRAVENOUS
  Filled 2020-03-19: qty 2

## 2020-03-19 MED ORDER — METOCLOPRAMIDE HCL 5 MG/ML IJ SOLN
10.0000 mg | Freq: Once | INTRAMUSCULAR | Status: AC
  Administered 2020-03-19: 10 mg via INTRAVENOUS
  Filled 2020-03-19: qty 2

## 2020-03-19 MED ORDER — PRENATAL 27-0.8 MG PO TABS: 1.0000 | ORAL_TABLET | Freq: Every day | ORAL | 0 refills | Status: DC

## 2020-03-19 MED ORDER — SODIUM CHLORIDE 0.9 % IV BOLUS
1000.0000 mL | Freq: Once | INTRAVENOUS | Status: AC
  Administered 2020-03-19: 1000 mL via INTRAVENOUS

## 2020-03-19 MED ORDER — METOCLOPRAMIDE HCL 10 MG PO TABS: 10.0000 mg | ORAL_TABLET | Freq: Four times a day (QID) | ORAL | 0 refills | Status: DC | PRN

## 2020-03-19 NOTE — ED Provider Notes (Signed)
[facility]-EMERGENCY DEPT Provider Note   CSN: [id number] Arrival date & time: 03/18/20  2318   History Chief Complaint  Patient presents with  . Emesis  . Abdominal Pain    Melissa [name] is a 34 y.o. female.  The history is provided by the patient.  Emesis Associated symptoms: abdominal pain   Abdominal Pain Associated symptoms: vomiting   She has history of asthma and comes in because of ongoing abdominal pain and vomiting.  She was seen in the ED yesterday with vomiting which was felt to be secondary to excess alcohol consumption.  She was discharged with prescription for promethazine.  In spite of that, she has continued to have nausea and vomiting and has vomited an estimated 6 times during the course of the day.  She also is complaining of lower abdominal pain which she rates at 8/10.  This is associated with sense of rectal urgency, but she has not been passing any stool.  She denies fever, chills, sweats.  Last menses was 2 weeks ago and was normal.  She is not using any contraception.  She denies any ethanol consumption since discharge.  Past Medical History:  Diagnosis Date  . Abnormal Pap smear 2010   Repeat pap done;Hysteroscopy;Last pap 2012;was normal  . Anemia 2010   Iron supplements PP  . Asthma    Has a nebulizer;triggered by stress  . Colon polyps   . Headache(784.0)   . Heart attack [facility])    MD thinks may have had a heart attack;Cardiologist @ [facility]  . History of CVA (cerebrovascular accident) 04/09/2013   Pt reports presumptive "stroke" when she was 34y.o.; followed by [facility] neurology for "chronic nerve pain" and has been Rx'd Hydrocodone and Mobic  . Infection    BV;may get frequently  . Nerve pain    Chronic;d/t possible stroke and MI  . Placenta previa antepartum 2010   Placenta moved 3 days before baby was born  . Sickle cell trait (HCC)   . Stroke [facility]) 2013   MD thought may have had a stroke and heart attack;has neurologist @  [facility]    Patient Active Problem List   Diagnosis Date Noted  . New daily persistent headache 04/09/2019  . Chronic pain 03/05/2019  . Postpartum depression 03/05/2019  . ST elevation 02/18/2019  . Chest pain with high risk of acute coronary syndrome - vs. acute pericarditis 02/18/2019  . Acute pericarditis 02/18/2019  . Chest pain 02/18/2019  . Influenza A 02/11/2019  . Asthma exacerbation 02/07/2019  . Acute hypoxemic respiratory failure (HCC) 02/07/2019  . Abnormal vaginal bleeding 05/22/2016  . Mild persistent chronic asthma without complication 09/22/2015  . Laryngopharyngeal reflux (LPR)   . Dyspnea   . Physical deconditioning   . Chronic hypertension complicating or reason for care during pregnancy 11/26/2014  . Positive GBS test 11/26/2014  . Chronic pain syndrome 11/26/2014  . Obesity (BMI 30-39.9) 11/26/2014  . Essential hypertension 10/19/2013  . History of CVA (cerebrovascular accident) 04/09/2013  . Hx Endometrial polyp 02/16/2013  . Sickle cell trait (HCC) 02/16/2013  . Severe persistent asthma with allergic rhinitis with status asthmaticus 02/16/2013  . Migraines 02/16/2013    Past Surgical History:  Procedure Laterality Date  . HYSTEROSCOPY  2010  . LEFT HEART CATH AND CORONARY ANGIOGRAPHY N/A 02/18/2019   Procedure: LEFT HEART CATH AND CORONARY ANGIOGRAPHY;  Surgeon: [name] Lex, MD;  Location: [facility] INVASIVE CV LAB;  Service: Cardiovascular;  Laterality: N/A;  OB History    Gravida  3   Para  3   Term  3   Preterm      AB      Living  3     SAB      TAB      Ectopic      Multiple  0   Live Births  3           Family History  Problem Relation Age of Onset  . Sickle cell trait Father   . Hypertension Mother   . Anemia Mother   . Asthma Mother   . Cervical cancer Paternal Aunt   . Cervical cancer Paternal Grandmother   . Diabetes Paternal Grandmother   . Diabetes Paternal Grandfather   . Diabetes Paternal  Aunt   . Asthma Brother   . Asthma Daughter   . Asthma Son     Social History   Tobacco Use  . Smoking status: Never Smoker  . Smokeless tobacco: Never Used  Substance Use Topics  . Alcohol use: Yes    Alcohol/week: 0.0 standard drinks    Comment: socially  . Drug use: No    Home Medications Prior to Admission medications   Medication Sig Start Date End Date Taking? Authorizing Provider  albuterol (PROVENTIL HFA;VENTOLIN HFA) 108 (90 BASE) MCG/ACT inhaler Inhale 2 puffs into the lungs every 4 (four) hours as needed for wheezing or shortness of breath. 08/29/15   Simonne Martinet, NP  albuterol (PROVENTIL) (2.5 MG/3ML) 0.083% nebulizer solution Take 2.5 mg by nebulization every 4 (four) hours as needed for wheezing or shortness of breath.    [provider]  amLODipine (NORVASC) 2.5 MG tablet Take 1 tablet (2.5 mg total) by mouth daily. 02/19/19 03/17/20  Berton Bon, NP  colchicine 0.6 MG tablet Take 0.5 tablets (0.3 mg total) by mouth 2 (two) times daily. Patient not taking: Reported on 03/17/2020 03/23/19   Pricilla Riffle, MD  estradiol (ESTRACE) 1 MG tablet Take 1 mg by mouth See admin instructions. 1 tablet for 14 days of every month    [provider]  fluticasone (FLONASE) 50 MCG/ACT nasal spray SPRAY 2 SPRAYS INTO EACH NOSTRIL EVERY DAY Patient taking differently: Place 2 sprays into both nostrils daily as needed for allergies or rhinitis.  08/03/19   Rodriguez-Southworth, Nettie Elm, PA-C  mometasone-formoterol (DULERA) 100-5 MCG/ACT AERO Inhale 2 puffs into the lungs 2 (two) times a day. Patient taking differently: Inhale 2 puffs into the lungs 2 (two) times daily as needed for wheezing.  05/27/19   Nyoka Cowden, MD  ondansetron (ZOFRAN ODT) 4 MG disintegrating tablet Take 1 tablet (4 mg total) by mouth every 8 (eight) hours as needed for nausea or vomiting. 03/17/20   Antony Madura, PA-C  pantoprazole (PROTONIX) 40 MG tablet TAKE 30-60 MIN BEFORE LAST MEAL OF THE  DAY Patient not taking: Reported on 03/17/2020 07/13/19   Nyoka Cowden, MD  tizanidine (ZANAFLEX) 2 MG capsule Take 1 capsule (2 mg total) by mouth 3 (three) times daily. Prn chest muscle spasms Patient taking differently: Take 2 mg by mouth 3 (three) times daily as needed for muscle spasms.  09/17/19   Rodriguez-Southworth, Nettie Elm, PA-C    Allergies    Ginger  Review of Systems   Review of Systems  Gastrointestinal: Positive for abdominal pain and vomiting.  All other systems reviewed and are negative.   Physical Exam Updated Vital Signs BP (!) 145/84 (BP Location: Right Arm)  Pulse 92   Temp 98.4 F (36.9 C) (Oral)   Resp 16   SpO2 100%   Physical Exam Vitals and nursing note reviewed.   34 year old female, resting comfortably and in no acute distress. Vital signs are significant for elevated blood pressure. Oxygen saturation is 100%, which is normal. Head is normocephalic and atraumatic. PERRLA, EOMI. Oropharynx is clear. Neck is nontender and supple without adenopathy or JVD. Back is nontender and there is no CVA tenderness. Lungs are clear without rales, wheezes, or rhonchi. Chest is nontender. Heart has regular rate and rhythm without murmur. Abdomen is soft, flat, nontender without masses or hepatosplenomegaly and peristalsis is hypoactive. Extremities have no cyanosis or edema, full range of motion is present. Skin is warm and dry without rash. Neurologic: Mental status is normal, cranial nerves are intact, there are no motor or sensory deficits.  ED Results / Procedures / Treatments   Labs (all labs ordered are listed, but only abnormal results are displayed) Labs Reviewed  COMPREHENSIVE METABOLIC PANEL - Abnormal; Notable for the following components:      Result Value   CO2 21 (*)    Calcium 8.6 (*)    Total Bilirubin 1.3 (*)    All other components within normal limits  HCG, QUANTITATIVE, PREGNANCY - Abnormal; Notable for the following components:   hCG,  Beta Chain, Quant, S 37,129 (*)    All other components within normal limits  I-STAT BETA HCG BLOOD, ED ([facility], [facility], [facility] ONLY) - Abnormal; Notable for the following components:   I-stat hCG, quantitative >2,000.0 (*)    All other components within normal limits  URINE CULTURE  LIPASE, BLOOD  ETHANOL  CBC WITH DIFFERENTIAL/PLATELET   Procedures Procedures  Medications Ordered in ED Medications  ondansetron (ZOFRAN) injection 4 mg (has no administration in time range)  sodium chloride 0.9 % bolus 1,000 mL (has no administration in time range)    ED Course  I have reviewed the triage vital signs and the nursing notes.  Pertinent lab results that were available during my care of the patient were reviewed by me and considered in my medical decision making (see chart for details).  MDM Rules/Calculators/A&P Persistent nausea and vomiting.  Old records are reviewed confirming ED visit yesterday for ethanol intoxication and vomiting.  No labs were obtained.  We will give IV fluids, ondansetron and check labs including ethanol level.  She had partial relief of nausea with ondansetron.  Labs are significant for positive pregnancy test.  Ethanol is not detectable.  She is given a dose of metoclopramide.  Quantitative hCG has come back at 37,129 indicating that she is somewhere beyond [redacted] weeks gestation.  She had excellent relief of nausea with metoclopramide.  She is advised to make arrangements for prenatal care.  Prescriptions are given for doxylamine with pyridoxine, prenatal vitamins, metoclopramide.  Return precautions discussed.  Final Clinical Impression(s) / ED Diagnoses Final diagnoses:  Non-intractable vomiting with nausea, unspecified vomiting type  First trimester pregnancy    Rx / DC Orders ED Discharge Orders         Ordered    Doxylamine-Pyridoxine (DICLEGIS) 10-10 MG TBEC  2 times daily PRN     03/19/20 0413    Prenatal Vit-Fe Fumarate-FA (MULTIVITAMIN-PRENATAL) 27-0.8 MG TABS  tablet  Daily     03/19/20 0413    metoCLOPramide (REGLAN) 10 MG tablet  Every 6 hours PRN     03/19/20 0413  Delora Fuel, MD 78/67/54 [phone]

## 2020-03-19 NOTE — Discharge Instructions (Signed)
Please make arrangements for prenatal care.  Return if you are having any problems.

## 2020-03-21 ENCOUNTER — Encounter (HOSPITAL_COMMUNITY): Payer: Self-pay | Admitting: Obstetrics and Gynecology

## 2020-03-21 ENCOUNTER — Other Ambulatory Visit: Payer: Self-pay

## 2020-03-21 ENCOUNTER — Inpatient Hospital Stay (HOSPITAL_COMMUNITY)
Admission: AD | Admit: 2020-03-21 | Discharge: 2020-03-22 | Disposition: A | Discharge status: Home / Self Care | Payer: BC Managed Care – PPO | Attending: Obstetrics and Gynecology | Admitting: Obstetrics and Gynecology

## 2020-03-21 ENCOUNTER — Inpatient Hospital Stay (HOSPITAL_COMMUNITY): Payer: BC Managed Care – PPO

## 2020-03-21 DIAGNOSIS — Z3491 Encounter for supervision of normal pregnancy, unspecified, first trimester: Secondary | ICD-10-CM

## 2020-03-21 DIAGNOSIS — Z6790 Unspecified blood type, Rh positive: Secondary | ICD-10-CM | POA: Diagnosis not present

## 2020-03-21 DIAGNOSIS — O209 Hemorrhage in early pregnancy, unspecified: Secondary | ICD-10-CM | POA: Insufficient documentation

## 2020-03-21 DIAGNOSIS — O4690 Antepartum hemorrhage, unspecified, unspecified trimester: Secondary | ICD-10-CM

## 2020-03-21 DIAGNOSIS — Z3A01 Less than 8 weeks gestation of pregnancy: Secondary | ICD-10-CM | POA: Insufficient documentation

## 2020-03-21 DIAGNOSIS — Z8673 Personal history of transient ischemic attack (TIA), and cerebral infarction without residual deficits: Secondary | ICD-10-CM | POA: Insufficient documentation

## 2020-03-21 DIAGNOSIS — O4691 Antepartum hemorrhage, unspecified, first trimester: Secondary | ICD-10-CM | POA: Diagnosis not present

## 2020-03-21 LAB — WET PREP, GENITAL
Sperm: NONE SEEN
Trich, Wet Prep: NONE SEEN
Yeast Wet Prep HPF POC: NONE SEEN

## 2020-03-21 LAB — URINE CULTURE: Culture: >=100000 — AB

## 2020-03-21 LAB — CBC
HCT: 35.9 % — ABNORMAL LOW (ref 36.0–46.0)
Hemoglobin: 12 g/dL (ref 12.0–15.0)
MCH: 28.6 pg (ref 26.0–34.0)
MCHC: 33.4 g/dL (ref 30.0–36.0)
MCV: 85.7 fL (ref 80.0–100.0)
Platelets: 200 10*3/uL (ref 150–400)
RBC: 4.19 MIL/uL (ref 3.87–5.11)
RDW: 14.1 % (ref 11.5–15.5)
WBC: 7.7 10*3/uL (ref 4.0–10.5)
nRBC: 0 % (ref 0.0–0.2)

## 2020-03-21 LAB — URINALYSIS, ROUTINE W REFLEX MICROSCOPIC
Bilirubin Urine: NEGATIVE
Glucose, UA: NEGATIVE mg/dL
Ketones, ur: NEGATIVE mg/dL
Nitrite: NEGATIVE
Protein, ur: 30 mg/dL — AB
Specific Gravity, Urine: 1.013 (ref 1.005–1.030)
WBC, UA: >50 WBC/hpf — ABNORMAL HIGH (ref 0–5)
pH: 6 (ref 5.0–8.0)

## 2020-03-21 LAB — HCG, QUANTITATIVE, PREGNANCY: hCG, Beta Chain, Quant, S: 65294 m[IU]/mL — ABNORMAL HIGH (ref <5)

## 2020-03-21 NOTE — MAU Provider Note (Signed)
History     CSN: [id number]  Arrival date and time: 03/21/20 2012   First Provider Initiated Contact with Patient 03/21/20 2313      Chief Complaint  Patient presents with  . Vaginal Bleeding   34 y.o. [id number] @unknown  gestation presenting with VB and abd pain. Bleeding has been ongoing since she had Nexplanon removed on 1/29. Today she used 2 pad and had 2 small clots. Pain is lower abdominal and aching. Rates 6/10. Has not taken anything for it. No fevers.   OB History    Gravida  4   Para  3   Term  3   Preterm      AB      Living  3     SAB      TAB      Ectopic      Multiple  0   Live Births  3           Past Medical History:  Diagnosis Date  . Abnormal Pap smear 2010   Repeat pap done;Hysteroscopy;Last pap 2012;was normal  . Anemia 2010   Iron supplements PP  . Asthma    Has a nebulizer;triggered by stress  . Colon polyps   . Headache(784.0)   . Heart attack [facility])    MD thinks may have had a heart attack;Cardiologist @ [facility]  . History of CVA (cerebrovascular accident) 04/09/2013   Pt reports presumptive "stroke" when she was 34y.o.; followed by [facility] neurology for "chronic nerve pain" and has been Rx'd Hydrocodone and Mobic  . Infection    BV;may get frequently  . Nerve pain    Chronic;d/t possible stroke and MI  . Placenta previa antepartum 2010   Placenta moved 3 days before baby was born  . Sickle cell trait ([place])   . Stroke [facility]) 2013   MD thought may have had a stroke and heart attack;has neurologist @ [facility]    Past Surgical History:  Procedure Laterality Date  . HYSTEROSCOPY  2010  . LEFT HEART CATH AND CORONARY ANGIOGRAPHY N/A 02/18/2019   Procedure: LEFT HEART CATH AND CORONARY ANGIOGRAPHY;  Surgeon: Leonie Man, MD;  Location: [facility] CV LAB;  Service: Cardiovascular;  Laterality: N/A;    Family History  Problem Relation Age of Onset  . Sickle cell trait Father   . Hypertension Mother   . Anemia  Mother   . Asthma Mother   . Cervical cancer Paternal Aunt   . Cervical cancer Paternal Grandmother   . Diabetes Paternal Grandmother   . Diabetes Paternal Grandfather   . Diabetes Paternal Aunt   . Asthma Brother   . Asthma Daughter   . Asthma Son     Social History   Tobacco Use  . Smoking status: Never Smoker  . Smokeless tobacco: Never Used  Substance Use Topics  . Alcohol use: Yes    Alcohol/week: 0.0 standard drinks    Comment: last use 03/16/2020  . Drug use: No    Allergies:  Allergies  Allergen Reactions  . Ginger Anaphylaxis    No medications prior to admission.    Review of Systems  Constitutional: Negative for chills and fever.  Gastrointestinal: Positive for abdominal pain, nausea and vomiting.  Genitourinary: Positive for frequency and vaginal bleeding. Negative for dysuria, hematuria, urgency and vaginal discharge.   Physical Exam   Blood pressure 136/86, pulse 75, temperature 98.8 F (37.1 C), resp. rate 17, weight 93.6 kg, last menstrual period 02/19/2020, unknown  if currently breastfeeding.  Physical Exam  Nursing note and vitals reviewed. Constitutional: She is oriented to person, place, and time. She appears well-developed and well-nourished. No distress.  HENT:  Head: Normocephalic and atraumatic.  Cardiovascular: Normal rate.  Respiratory: Effort normal. No respiratory distress.  GI: Soft. She exhibits no distension and no mass. There is no abdominal tenderness. There is no rebound and no guarding.  Genitourinary:    Genitourinary Comments: External: no lesions or erythema Vagina: rugated, pink, moist, small amt bloody discharge Uterus: non enlarged, anteverted, non tender, no CMT Adnexae: no masses, no tenderness left, no tenderness right Cervix closed    Musculoskeletal:        General: Normal range of motion.     Cervical back: Normal range of motion.  Neurological: She is alert and oriented to person, place, and time.  Skin: Skin  is warm and dry.  Psychiatric: She has a normal mood and affect.   Results for orders placed or performed during the hospital encounter of 03/21/20 (from the past 24 hour(s))  Urinalysis, Routine w reflex microscopic     Status: Abnormal   Collection Time: 03/21/20  8:48 PM  Result Value Ref Range   Color, Urine YELLOW YELLOW   APPearance HAZY (A) CLEAR   Specific Gravity, Urine 1.013 1.005 - 1.030   pH 6.0 5.0 - 8.0   Glucose, UA NEGATIVE NEGATIVE mg/dL   Hgb urine dipstick MODERATE (A) NEGATIVE   Bilirubin Urine NEGATIVE NEGATIVE   Ketones, ur NEGATIVE NEGATIVE mg/dL   Protein, ur 30 (A) NEGATIVE mg/dL   Nitrite NEGATIVE NEGATIVE   Leukocytes,Ua LARGE (A) NEGATIVE   RBC / HPF 11-20 0 - 5 RBC/hpf   WBC, UA >50 (H) 0 - 5 WBC/hpf   Bacteria, UA MANY (A) NONE SEEN   Squamous Epithelial / LPF 11-20 0 - 5   Mucus PRESENT   CBC     Status: Abnormal   Collection Time: 03/21/20  9:30 PM  Result Value Ref Range   WBC 7.7 4.0 - 10.5 K/uL   RBC 4.19 3.87 - 5.11 MIL/uL   Hemoglobin 12.0 12.0 - 15.0 g/dL   HCT 40.3 (L) 47.4 - 25.9 %   MCV 85.7 80.0 - 100.0 fL   MCH 28.6 26.0 - 34.0 pg   MCHC 33.4 30.0 - 36.0 g/dL   RDW 56.3 87.5 - 64.3 %   Platelets 200 150 - 400 K/uL   nRBC 0.0 0.0 - 0.2 %  hCG, quantitative, pregnancy     Status: Abnormal   Collection Time: 03/21/20  9:30 PM  Result Value Ref Range   hCG, Beta Chain, Quant, S 65,294 (H) <5 mIU/mL  Wet prep, genital     Status: Abnormal   Collection Time: 03/21/20 11:23 PM  Result Value Ref Range   Yeast Wet Prep HPF POC NONE SEEN NONE SEEN   Trich, Wet Prep NONE SEEN NONE SEEN   Clue Cells Wet Prep HPF POC PRESENT (A) NONE SEEN   WBC, Wet Prep HPF POC MANY (A) NONE SEEN   Sperm NONE SEEN    US OB Comp Less 14 Wks  Result Date: 03/21/2020 CLINICAL DATA:  34 year old female with bleeding and lower abdominal pain in the 1st trimester of pregnancy. Quantitative beta HCG 65,294. Although estimated gestational age by LMP 4 weeks  and 3 days. EXAM: OBSTETRIC <14 WK ULTRASOUND TECHNIQUE: Transabdominal ultrasound was performed for evaluation of the gestation as well as the maternal uterus and adnexal regions.  COMPARISON:  None relevant. FINDINGS: Intrauterine gestational sac: Single Yolk sac:  Visible Embryo:  Visible Cardiac Activity: Detected Heart Rate: 112 bpm CRL:   4.2 mm   6 w 1 d                  [place] EDC: 11/13/2020 Subchorionic hemorrhage:  None visualized. Maternal uterus/adnexae: The left ovary appears normal measuring 4.3 x 2.7 x 3.6 cm. The right ovary appears normal measuring 2.7 x 1.7 x 1.5 cm. No pelvic free fluid. However, there does appear to be echogenic debris within the urinary bladder on images 9 and 29. IMPRESSION: 1. Single living IUP demonstrated. No acute maternal findings visualized. 2. Echogenic debris floating within the urinary bladder, query UTI. Electronically Signed   By: Odessa Fleming M.D.   On: 03/21/2020 23:51   MAU Course  Procedures  MDM Labs and [place] ordered and reviewed. Normal IUP on [place], no SCH seen. Discussed findings with pt. UA with bacteria, leuks, and WBCs, will send UC. Stable for discharge home.  Assessment and Plan   1. [redacted] weeks gestation of pregnancy   2. Vaginal bleeding in pregnancy   3. Normal intrauterine pregnancy on prenatal ultrasound in first trimester   4. Blood type, Rh positive    Discharge home Follow up at [facility] to start care SAB precautions  Allergies as of 03/22/2020      Reactions   Ginger Anaphylaxis      Medication List    STOP taking these medications   amLODipine 2.5 MG tablet Commonly known as: NORVASC   estradiol 1 MG tablet Commonly known as: ESTRACE   tizanidine 2 MG capsule Commonly known as: Zanaflex     TAKE these medications   albuterol 108 (90 Base) MCG/ACT inhaler Commonly known as: VENTOLIN HFA Inhale 2 puffs into the lungs every 4 (four) hours as needed for wheezing or shortness of breath.   albuterol (2.5 MG/3ML) 0.083% nebulizer  solution Commonly known as: PROVENTIL Take 2.5 mg by nebulization every 4 (four) hours as needed for wheezing or shortness of breath.   Doxylamine-Pyridoxine 10-10 MG Tbec Commonly known as: Diclegis Take 1 tablet by mouth 2 (two) times daily as needed (nausea).   fluticasone 50 MCG/ACT nasal spray Commonly known as: FLONASE SPRAY 2 SPRAYS INTO EACH NOSTRIL EVERY DAY What changed: See the new instructions.   metoCLOPramide 10 MG tablet Commonly known as: REGLAN Take 1 tablet (10 mg total) by mouth every 6 (six) hours as needed for nausea (or headache).   mometasone-formoterol 100-5 MCG/ACT Aero Commonly known as: DULERA Inhale 2 puffs into the lungs 2 (two) times a day. What changed:   when to take this  reasons to take this   multivitamin-prenatal 27-0.8 MG Tabs tablet Take 1 tablet by mouth daily at 12 noon.   ondansetron 4 MG disintegrating tablet Commonly known as: Zofran ODT Take 1 tablet (4 mg total) by mouth every 8 (eight) hours as needed for nausea or vomiting.      Donette Larry, CNM 03/22/2020, 2:03 AM

## 2020-03-21 NOTE — MAU Note (Signed)
Patient reports she has been having ongoing vaginal bleeding but found out she was pregnant on Saturday.  Reports she isn't sure when her last normal period was-had her nexplanon removed on 1/29 due to irregular bleeding.  The bleeding today was bright red, some stringy clots but nothing too large.  Some lower bilateral abdominal pain that started this AM.  Has not taken anything for the pain.

## 2020-03-22 ENCOUNTER — Telehealth: Payer: Self-pay | Admitting: Emergency Medicine

## 2020-03-22 DIAGNOSIS — O4691 Antepartum hemorrhage, unspecified, first trimester: Secondary | ICD-10-CM

## 2020-03-22 DIAGNOSIS — Z3A01 Less than 8 weeks gestation of pregnancy: Secondary | ICD-10-CM | POA: Diagnosis not present

## 2020-03-22 DIAGNOSIS — Z6790 Unspecified blood type, Rh positive: Secondary | ICD-10-CM

## 2020-03-22 NOTE — Progress Notes (Signed)
ED Antimicrobial Stewardship Positive Culture Follow Up   Melissa [name] is an 34 y.o. female who presented to [facility] on 03/18/2020 with a chief complaint of  Chief Complaint  Patient presents with  . Emesis  . Abdominal Pain    Recent Results (from the past 720 hour(s))  Urine culture     Status: Abnormal   Collection Time: 03/19/20  2:15 AM   Specimen: Urine, Random  Result Value Ref Range Status   Specimen Description   Final    URINE, RANDOM Performed at [facility], [address], [place], [place] [address]    Special Requests   Final    NONE Performed at [facility], [address], [place], [place] [address]    Culture >=100,000 COLONIES/mL ESCHERICHIA COLI (A)  Final   Report Status 03/21/2020 FINAL  Final   Organism ID, Bacteria ESCHERICHIA COLI (A)  Final      Susceptibility   Escherichia coli - MIC*    AMPICILLIN 4 SENSITIVE Sensitive     CEFAZOLIN <=4 SENSITIVE Sensitive     CEFTRIAXONE <=0.25 SENSITIVE Sensitive     CIPROFLOXACIN <=0.25 SENSITIVE Sensitive     GENTAMICIN <=1 SENSITIVE Sensitive     IMIPENEM <=0.25 SENSITIVE Sensitive     NITROFURANTOIN <=16 SENSITIVE Sensitive     TRIMETH/SULFA <=20 SENSITIVE Sensitive     AMPICILLIN/SULBACTAM <=2 SENSITIVE Sensitive     PIP/TAZO <=4 SENSITIVE Sensitive     * >=100,000 COLONIES/mL ESCHERICHIA COLI  Wet prep, genital     Status: Abnormal   Collection Time: 03/21/20 11:23 PM  Result Value Ref Range Status   Yeast Wet Prep HPF POC NONE SEEN NONE SEEN Final   Trich, Wet Prep NONE SEEN NONE SEEN Final   Clue Cells Wet Prep HPF POC PRESENT (A) NONE SEEN Final   WBC, Wet Prep HPF POC MANY (A) NONE SEEN Final   Sperm NONE SEEN  Final    Comment: Performed at [facility], [address], [place], [place] [address]    []  Treated with , organism resistant to prescribed antimicrobial [x]  Patient discharged originally without antimicrobial agent and treatment is now  indicated  New antibiotic prescription:  - Cephalexin 500 mg PO q8h x 7 days   ED Provider: Dr. , PharmD, BCPS 03/22/2020 11:13 AM

## 2020-03-22 NOTE — Discharge Instructions (Signed)

## 2020-03-22 NOTE — Telephone Encounter (Signed)
Post ED Visit - Positive Culture Follow-up: Successful Patient Follow-Up  Culture assessed and recommendations reviewed by:  []  , Pharm.D. []  Enzo Bi, Pharm.D., BCPS AQ-ID []  , Pharm.D., BCPS []  Celedonio Miyamoto, Pharm.D., BCPS []  [place], Garvin Fila.D., BCPS, AAHIVP []  , Pharm.D., BCPS, AAHIVP []  Georgina Pillion, PharmD, BCPS []  , PharmD, BCPS []  [place], PharmD, BCPS []  [address], PharmD PharmD  Positive urine culture  [x]  Patient discharged without antimicrobial prescription and treatment is now indicated []  Organism is resistant to prescribed ED discharge antimicrobial []  Patient with positive blood cultures  Changes discussed with ED provider: Estella Husk MD New antibiotic prescription start cephalexin 500mg  po q 8 hours x 7 days  Attempting to contact patient   03/22/2020, 12:30 PM

## 2020-03-23 DIAGNOSIS — Z113 Encounter for screening for infections with a predominantly sexual mode of transmission: Secondary | ICD-10-CM | POA: Diagnosis not present

## 2020-03-23 DIAGNOSIS — N390 Urinary tract infection, site not specified: Secondary | ICD-10-CM | POA: Diagnosis not present

## 2020-03-23 DIAGNOSIS — O200 Threatened abortion: Secondary | ICD-10-CM | POA: Diagnosis not present

## 2020-03-23 DIAGNOSIS — N760 Acute vaginitis: Secondary | ICD-10-CM | POA: Diagnosis not present

## 2020-03-23 DIAGNOSIS — N898 Other specified noninflammatory disorders of vagina: Secondary | ICD-10-CM | POA: Diagnosis not present

## 2020-03-23 LAB — GC/CHLAMYDIA PROBE AMP (~~LOC~~) NOT AT ARMC
Chlamydia: NEGATIVE
Comment: NEGATIVE
Comment: NORMAL
Neisseria Gonorrhea: NEGATIVE

## 2020-03-23 LAB — CULTURE, OB URINE: Culture: 90000 — AB

## 2020-03-24 ENCOUNTER — Encounter: Payer: Self-pay | Admitting: Internal Medicine

## 2020-03-24 ENCOUNTER — Telehealth: Payer: Self-pay | Admitting: Women's Health

## 2020-03-24 NOTE — Telephone Encounter (Signed)
Attempted to call patient re: +UTI, no answer, left VM requesting return phone call. This is attempt #1.  Marylen Ponto, NP  10:43 AM 03/24/2020

## 2020-03-25 ENCOUNTER — Other Ambulatory Visit: Payer: Self-pay | Admitting: Student

## 2020-03-25 ENCOUNTER — Telehealth: Payer: Self-pay | Admitting: Student

## 2020-03-25 DIAGNOSIS — O2341 Unspecified infection of urinary tract in pregnancy, first trimester: Secondary | ICD-10-CM | POA: Insufficient documentation

## 2020-03-25 MED ORDER — CEPHALEXIN 500 MG PO CAPS: 500.0000 mg | ORAL_CAPSULE | Freq: Four times a day (QID) | ORAL | 0 refills | Status: DC

## 2020-03-25 NOTE — Telephone Encounter (Signed)
Attempt #2 made to notify patient of UTI. Left voicemail.   Judeth Horn, NP

## 2020-04-13 ENCOUNTER — Other Ambulatory Visit: Payer: Self-pay

## 2020-04-13 ENCOUNTER — Encounter (INDEPENDENT_AMBULATORY_CARE_PROVIDER_SITE_OTHER): Payer: Self-pay

## 2020-04-13 ENCOUNTER — Encounter (HOSPITAL_COMMUNITY): Payer: Self-pay | Admitting: Obstetrics & Gynecology

## 2020-04-13 ENCOUNTER — Inpatient Hospital Stay (HOSPITAL_COMMUNITY)
Admission: AD | Admit: 2020-04-13 | Discharge: 2020-04-14 | Disposition: A | Discharge status: Home / Self Care | Payer: BC Managed Care – PPO | Attending: Obstetrics & Gynecology | Admitting: Obstetrics & Gynecology

## 2020-04-13 DIAGNOSIS — Z79899 Other long term (current) drug therapy: Secondary | ICD-10-CM | POA: Insufficient documentation

## 2020-04-13 DIAGNOSIS — O219 Vomiting of pregnancy, unspecified: Secondary | ICD-10-CM

## 2020-04-13 DIAGNOSIS — O2341 Unspecified infection of urinary tract in pregnancy, first trimester: Secondary | ICD-10-CM

## 2020-04-13 DIAGNOSIS — D573 Sickle-cell trait: Secondary | ICD-10-CM | POA: Diagnosis not present

## 2020-04-13 DIAGNOSIS — E860 Dehydration: Secondary | ICD-10-CM

## 2020-04-13 DIAGNOSIS — G4452 New daily persistent headache (NDPH): Secondary | ICD-10-CM

## 2020-04-13 DIAGNOSIS — O99011 Anemia complicating pregnancy, first trimester: Secondary | ICD-10-CM | POA: Insufficient documentation

## 2020-04-13 DIAGNOSIS — Z3A09 9 weeks gestation of pregnancy: Secondary | ICD-10-CM | POA: Diagnosis not present

## 2020-04-13 DIAGNOSIS — I252 Old myocardial infarction: Secondary | ICD-10-CM | POA: Insufficient documentation

## 2020-04-13 DIAGNOSIS — O99511 Diseases of the respiratory system complicating pregnancy, first trimester: Secondary | ICD-10-CM | POA: Diagnosis not present

## 2020-04-13 DIAGNOSIS — O26891 Other specified pregnancy related conditions, first trimester: Secondary | ICD-10-CM | POA: Diagnosis not present

## 2020-04-13 DIAGNOSIS — O99281 Endocrine, nutritional and metabolic diseases complicating pregnancy, first trimester: Secondary | ICD-10-CM | POA: Diagnosis not present

## 2020-04-13 DIAGNOSIS — Z8673 Personal history of transient ischemic attack (TIA), and cerebral infarction without residual deficits: Secondary | ICD-10-CM | POA: Diagnosis not present

## 2020-04-13 DIAGNOSIS — J45909 Unspecified asthma, uncomplicated: Secondary | ICD-10-CM | POA: Diagnosis not present

## 2020-04-13 HISTORY — DX: Essential (primary) hypertension: I10

## 2020-04-13 LAB — URINALYSIS, ROUTINE W REFLEX MICROSCOPIC
Bilirubin Urine: NEGATIVE
Glucose, UA: NEGATIVE mg/dL
Ketones, ur: 20 mg/dL — AB
Nitrite: NEGATIVE
Protein, ur: 100 mg/dL — AB
Specific Gravity, Urine: 1.027 (ref 1.005–1.030)
WBC, UA: >50 WBC/hpf — ABNORMAL HIGH (ref 0–5)
pH: 5 (ref 5.0–8.0)

## 2020-04-13 MED ORDER — LACTATED RINGERS IV BOLUS
1000.0000 mL | Freq: Once | INTRAVENOUS | Status: AC
  Administered 2020-04-13: 1000 mL via INTRAVENOUS

## 2020-04-13 MED ORDER — METOCLOPRAMIDE HCL 5 MG/ML IJ SOLN
10.0000 mg | Freq: Once | INTRAMUSCULAR | Status: AC
  Administered 2020-04-13: 10 mg via INTRAVENOUS
  Filled 2020-04-13: qty 2

## 2020-04-13 MED ORDER — CEFTRIAXONE SODIUM 1 G IJ SOLR
1.0000 g | INTRAMUSCULAR | Status: DC
  Administered 2020-04-13: 1 g via INTRAMUSCULAR
  Filled 2020-04-13: qty 10

## 2020-04-13 MED ORDER — ONDANSETRON HCL 4 MG/2ML IJ SOLN
4.0000 mg | Freq: Once | INTRAMUSCULAR | Status: AC
  Administered 2020-04-13: 4 mg via INTRAVENOUS
  Filled 2020-04-13: qty 2

## 2020-04-13 MED ORDER — FAMOTIDINE IN NACL 20-0.9 MG/50ML-% IV SOLN
20.0000 mg | Freq: Once | INTRAVENOUS | Status: AC
  Administered 2020-04-13: 20 mg via INTRAVENOUS
  Filled 2020-04-13: qty 50

## 2020-04-13 NOTE — MAU Provider Note (Addendum)
History     CSN: [id number]  Arrival date and time: 04/13/20 2153   None     No chief complaint on file.  HPI   Melissa [name] is a 34 yo G4P3003 at 9 weeks 3 days EGA who is presenting to MAU for nausea and vomiting. She receives her care at [facility].  She has been nauseated since 3/19, when she found out she was pregnant. She was given reglan and diclegis, which helped until she couldn't keep the medicine down. She has not been able to keep water down since yesterday, reports she has vomited 17 times today. Has not been able to keep food down in a few days. Does have some associated abdominal pain, but only when she is vomiting. Denies diarrhea, constipation, hematochezia, melena, or vaginal discharge. She does has dysuria and a UTI that she has been unable to keep down her antibiotics for. She also has a mild headache, which is global, and is typical for her when she is dehydrated.  OB History    Gravida  4   Para  3   Term  3   Preterm      AB      Living  3     SAB      TAB      Ectopic      Multiple  0   Live Births  3           Past Medical History:  Diagnosis Date  . Abnormal Pap smear 2010   Repeat pap done;Hysteroscopy;Last pap 2012;was normal  . Anemia 2010   Iron supplements PP  . Asthma    Has a nebulizer;triggered by stress  . Colon polyps   . Headache(784.0)   . Heart attack [facility])    MD thinks may have had a heart attack;Cardiologist @ [facility]  . History of CVA (cerebrovascular accident) 04/09/2013   Pt reports presumptive "stroke" when she was 34y.o.; followed by [facility] neurology for "chronic nerve pain" and has been Rx'd Hydrocodone and Mobic  . Infection    BV;may get frequently  . Nerve pain    Chronic;d/t possible stroke and MI  . Placenta previa antepartum 2010   Placenta moved 3 days before baby was born  . Sickle cell trait ([place])   . Stroke [facility]) 2013   MD thought may have had a stroke and heart attack;has neurologist @ [facility]     Past Surgical History:  Procedure Laterality Date  . HYSTEROSCOPY  2010  . LEFT HEART CATH AND CORONARY ANGIOGRAPHY N/A 02/18/2019   Procedure: LEFT HEART CATH AND CORONARY ANGIOGRAPHY;  Surgeon: Leonie Man, MD;  Location: [facility];  Service: Cardiovascular;  Laterality: N/A;    Family History  Problem Relation Age of Onset  . Sickle cell trait Father   . Hypertension Mother   . Anemia Mother   . Asthma Mother   . Cervical cancer Paternal Aunt   . Cervical cancer Paternal Grandmother   . Diabetes Paternal Grandmother   . Diabetes Paternal Grandfather   . Diabetes Paternal Aunt   . Asthma Brother   . Asthma Daughter   . Asthma Son     Social History   Tobacco Use  . Smoking status: Never Smoker  . Smokeless tobacco: Never Used  Substance Use Topics  . Alcohol use: Yes    Alcohol/week: 0.0 standard drinks    Comment: last use 03/16/2020  . Drug use: No    Allergies:  Allergies  Allergen Reactions  . Ginger Anaphylaxis    Medications Prior to Admission  Medication Sig Dispense Refill Last Dose  . albuterol (PROVENTIL HFA;VENTOLIN HFA) 108 (90 BASE) MCG/ACT inhaler Inhale 2 puffs into the lungs every 4 (four) hours as needed for wheezing or shortness of breath. 1 Inhaler 12   . albuterol (PROVENTIL) (2.5 MG/3ML) 0.083% nebulizer solution Take 2.5 mg by nebulization every 4 (four) hours as needed for wheezing or shortness of breath.     . cephALEXin (KEFLEX) 500 MG capsule Take 1 capsule (500 mg total) by mouth 4 (four) times daily. 28 capsule 0   . Doxylamine-Pyridoxine (DICLEGIS) 10-10 MG TBEC Take 1 tablet by mouth 2 (two) times daily as needed (nausea). 60 tablet 0   . fluticasone (FLONASE) 50 MCG/ACT nasal spray SPRAY 2 SPRAYS INTO EACH NOSTRIL EVERY DAY (Patient taking differently: Place 2 sprays into both nostrils daily as needed for allergies or rhinitis. ) 48 mL 1   . metoCLOPramide (REGLAN) 10 MG tablet Take 1 tablet (10 mg total) by mouth  every 6 (six) hours as needed for nausea (or headache). 30 tablet 0   . mometasone-formoterol (DULERA) 100-5 MCG/ACT AERO Inhale 2 puffs into the lungs 2 (two) times a day. (Patient taking differently: Inhale 2 puffs into the lungs 2 (two) times daily as needed for wheezing. ) 1 Inhaler 0   . ondansetron (ZOFRAN ODT) 4 MG disintegrating tablet Take 1 tablet (4 mg total) by mouth every 8 (eight) hours as needed for nausea or vomiting. 10 tablet 0   . Prenatal Vit-Fe Fumarate-FA (MULTIVITAMIN-PRENATAL) 27-0.8 MG TABS tablet Take 1 tablet by mouth daily at 12 noon. 30 tablet 0     Review of Systems  All other systems reviewed and are negative.  Physical Exam   Last menstrual period 02/19/2020, unknown if currently breastfeeding.  Physical Exam  Nursing note and vitals reviewed. Constitutional: She is oriented to person, place, and time. She appears well-developed and well-nourished.  HENT:  Head: Normocephalic and atraumatic.  Eyes: Pupils are equal, round, and reactive to light. Conjunctivae and EOM are normal.  Cardiovascular: Normal rate, regular rhythm, normal heart sounds and intact distal pulses.  Respiratory: Effort normal and breath sounds normal.  GI: Soft. Bowel sounds are normal. She exhibits no distension and no mass. There is no abdominal tenderness. There is no rebound and no guarding.  Musculoskeletal:        General: Normal range of motion.     Cervical back: Normal range of motion and neck supple.  Neurological: She is alert and oriented to person, place, and time. She has normal reflexes.  Skin: Skin is warm and dry.  Psychiatric: She has a normal mood and affect. Her behavior is normal. Judgment and thought content normal.    MAU Course  Procedures  MDM -IVF bolus with IV zofran, reglan, and pepcid -IM Rocephin for UTI as she has not been able to keep down her antibiotics -CMP pending  Report given to Wynelle Bourgeois, CNM at 1217 AM Marlowe Alt, DO OB  Fellow, [facility] 04/14/2020 12:18 AM  Assumed care Still has some headache and nausea We gave another bag of IV fluids, this time D5LR to see if dextrose would help HA which it did Able to tolerate sips Tips on resuming diet given  Assessment and Plan  34 yo G4P3003 at 9 weeks 3 days EGA who is presenting to MAU for nausea and vomiting Dehydration Headache, chronic UTI  Discharge home Resume Keflex as tolerated Resume antemetics at home Advance diet as tolerated Melissa [name], CNM   Melissa [name] 04/13/2020, 9:59 PM

## 2020-04-13 NOTE — MAU Note (Signed)
Pt reports to MAU c/o NV. Pt states she has vomited 16x today. Pt reports some abdominal cramping that started a couple of days ago. Pt denies bleeding. Pt reports she has BV and UTI and is unable to keep medications down so she is unsure if they are working. Pt reports a HA that has been ongoing x 2 day. Pt has not taken medications for HA.

## 2020-04-14 DIAGNOSIS — Z3A09 9 weeks gestation of pregnancy: Secondary | ICD-10-CM | POA: Diagnosis not present

## 2020-04-14 DIAGNOSIS — O219 Vomiting of pregnancy, unspecified: Secondary | ICD-10-CM | POA: Diagnosis not present

## 2020-04-14 LAB — COMPREHENSIVE METABOLIC PANEL
ALT: 12 U/L (ref 0–44)
AST: 19 U/L (ref 15–41)
Albumin: 3.7 g/dL (ref 3.5–5.0)
Alkaline Phosphatase: 45 U/L (ref 38–126)
Anion gap: 10 (ref 5–15)
BUN: 6 mg/dL (ref 6–20)
CO2: 21 mmol/L — ABNORMAL LOW (ref 22–32)
Calcium: 9.3 mg/dL (ref 8.9–10.3)
Chloride: 105 mmol/L (ref 98–111)
Creatinine, Ser: 0.73 mg/dL (ref 0.44–1.00)
GFR calc Af Amer: >60 mL/min (ref >60)
GFR calc non Af Amer: >60 mL/min (ref >60)
Glucose, Bld: 90 mg/dL (ref 70–99)
Potassium: 3.8 mmol/L (ref 3.5–5.1)
Sodium: 136 mmol/L (ref 135–145)
Total Bilirubin: 1.2 mg/dL (ref 0.3–1.2)
Total Protein: 7.6 g/dL (ref 6.5–8.1)

## 2020-04-14 MED ORDER — DEXTROSE IN LACTATED RINGERS 5 % IV SOLN: Freq: Once | INTRAVENOUS | Status: AC

## 2020-04-14 MED ORDER — LACTATED RINGERS IV BOLUS
1000.0000 mL | Freq: Once | INTRAVENOUS | Status: AC
  Administered 2020-04-14: 1000 mL via INTRAVENOUS

## 2020-04-14 NOTE — Discharge Instructions (Signed)
Dehydration, Adult Dehydration is a condition in which there is not enough water or other fluids in the body. This happens when a person loses more fluids than he or she takes in. Important organs, such as the kidneys, brain, and heart, cannot function without a proper amount of fluids. Any loss of fluids from the body can lead to dehydration. Dehydration can be mild, moderate, or severe. It should be treated right away to prevent it from becoming severe. What are the causes? Dehydration may be caused by:  Conditions that cause loss of water or other fluids, such as diarrhea, vomiting, or sweating or urinating a lot.  Not drinking enough fluids, especially when you are ill or doing activities that require a lot of energy.  Other illnesses and conditions, such as fever or infection.  Certain medicines, such as medicines that remove excess fluid from the body (diuretics).  Lack of safe drinking water.  Not being able to get enough water and food. What increases the risk? The following factors may make you more likely to develop this condition:  Having a long-term (chronic) illness that has not been treated properly, such as diabetes, heart disease, or kidney disease.  Being 15 years of age or older.  Having a disability.  Living in a place that is high in altitude, where thinner, drier air causes more fluid loss.  Doing exercises that put stress on your body for a long time (endurance sports). What are the signs or symptoms? Symptoms of dehydration depend on how severe it is. Mild or moderate dehydration  Thirst.  Dry lips or dry mouth.  Dizziness or light-headedness, especially when standing up from a seated position.  Muscle cramps.  Dark urine. Urine may be the color of tea.  Less urine or tears produced than usual.  Headache. Severe dehydration  Changes in skin. Your skin may be cold and clammy, blotchy, or pale. Your skin also may not return to normal after  being lightly pinched and released.  Little or no tears, urine, or sweat.  Changes in vital signs, such as rapid breathing and low blood pressure. Your pulse may be weak or may be faster than 100 beats a minute when you are sitting still.  Other changes, such as: ? Feeling very thirsty. ? Sunken eyes. ? Cold hands and feet. ? Confusion. ? Being very tired (lethargic) or having trouble waking from sleep. ? Short-term weight loss. ? Loss of consciousness. How is this diagnosed? This condition is diagnosed based on your symptoms and a physical exam. You may have blood and urine tests to help confirm the diagnosis. How is this treated? Treatment for this condition depends on how severe it is. Treatment should be started right away. Do not wait until dehydration becomes severe. Severe dehydration is an emergency and needs to be treated in a hospital.  Mild or moderate dehydration can be treated at home. You may be asked to: ? Drink more fluids. ? Drink an oral rehydration solution (ORS). This drink helps restore proper amounts of fluids and salts and minerals in the blood (electrolytes).  Severe dehydration can be treated: ? With IV fluids. ? By correcting abnormal levels of electrolytes. This is often done by giving electrolytes through a tube that is passed through your nose and into your stomach (nasogastric tube, or NG tube). ? By treating the underlying cause of dehydration. Follow these instructions at home: Oral rehydration solution If told by your health care provider, drink an ORS:  Make an ORS by following instructions on the package.  Start by drinking small amounts, about  cup (120 mL) every 5-10 minutes.  Slowly increase how much you drink until you have taken the amount recommended by your health care provider. Eating and drinking         Drink enough clear fluid to keep your urine pale yellow. If you were told to drink an ORS, finish the ORS first and then start  slowly drinking other clear fluids. Drink fluids such as: ? Water. Do not drink only water. Doing that can lead to hyponatremia, which is having too little salt (sodium) in the body. ? Water from ice chips you suck on. ? Fruit juice that you have added water to (diluted fruit juice). ? Low-calorie sports drinks.  Eat foods that contain a healthy balance of electrolytes, such as bananas, oranges, potatoes, tomatoes, and spinach.  Do not drink alcohol.  Avoid the following: ? Drinks that contain a lot of sugar. These include high-calorie sports drinks, fruit juice that is not diluted, and soda. ? Caffeine. ? Foods that are greasy or contain a lot of fat or sugar. General instructions  Take over-the-counter and prescription medicines only as told by your health care provider.  Do not take sodium tablets. Doing that can lead to having too much sodium in the body (hypernatremia).  Return to your normal activities as told by your health care provider. Ask your health care provider what activities are safe for you.  Keep all follow-up visits as told by your health care provider. This is important. Contact a health care provider if:  You have muscle cramps, pain, or discomfort, such as: ? Pain in your abdomen and the pain gets worse or stays in one area (localizes). ? Stiff neck.  You have a rash.  You are more irritable than usual.  You are sleepier or have a harder time waking than usual.  You feel weak or dizzy.  You feel very thirsty. Get help right away if you have:  Any symptoms of severe dehydration.  Symptoms of vomiting, such as: ? You cannot eat or drink without vomiting. ? Vomiting gets worse or does not go away. ? Vomit includes blood or green matter (bile).  Symptoms that get worse with treatment.  A fever.  A severe headache.  Problems with urination or bowel movements, such as: ? Diarrhea that gets worse or does not go away. ? Blood in your stool (feces).  This may cause stool to look black and tarry. ? Not urinating, or urinating only a small amount of very dark urine, within 6-8 hours.  Trouble breathing. These symptoms may represent a serious problem that is an emergency. Do not wait to see if the symptoms will go away. Get medical help right away. Call your local emergency services (911 in the [place] Do not drive yourself to the hospital. Summary  Dehydration is a condition in which there is not enough water or other fluids in the body. This happens when a person loses more fluids than he or she takes in.  Treatment for this condition depends on how severe it is. Treatment should be started right away. Do not wait until dehydration becomes severe.  Drink enough clear fluid to keep your urine pale yellow. If you were told to drink an oral rehydration solution (ORS), finish the ORS first and then start slowly drinking other clear fluids.  Take over-the-counter and prescription medicines only as told by your health  care provider.  Get help right away if you have any symptoms of severe dehydration. This information is not intended to replace advice given to you by your health care provider. Make sure you discuss any questions you have with your health care provider. Document Revised: 07/30/2019 Document Reviewed: 07/30/2019 Elsevier Patient Education  2020 [employer] Morning Sickness  Morning sickness is when a woman feels nauseous during pregnancy. This nauseous feeling may or may not come with vomiting. It often occurs in the morning, but it can be a problem at any time of day. Morning sickness is most common during the first trimester. In some cases, it may continue throughout pregnancy. Although morning sickness is unpleasant, it is usually harmless unless the woman develops severe and continual vomiting (hyperemesis gravidarum), a condition that requires more intense treatment. What are the causes? The exact cause of this condition is  not known, but it seems to be related to normal hormonal changes that occur in pregnancy. What increases the risk? You are more likely to develop this condition if:  You experienced nausea or vomiting before your pregnancy.  You had morning sickness during a previous pregnancy.  You are pregnant with more than one baby, such as twins. What are the signs or symptoms? Symptoms of this condition include:  Nausea.  Vomiting. How is this diagnosed? This condition is usually diagnosed based on your signs and symptoms. How is this treated? In many cases, treatment is not needed for this condition. Making some changes to what you eat may help to control symptoms. Your health care provider may also prescribe or recommend:  Vitamin B6 supplements.  Anti-nausea medicines.  Ginger. Follow these instructions at home: Medicines  Take over-the-counter and prescription medicines only as told by your health care provider. Do not use any prescription, over-the-counter, or herbal medicines for morning sickness without first talking with your health care provider.  Taking multivitamins before getting pregnant can prevent or decrease the severity of morning sickness in most women. Eating and drinking  Eat a piece of dry toast or crackers before getting out of bed in the morning.  Eat 5 or 6 small meals a day.  Eat dry and bland foods, such as rice or a baked potato. Foods that are high in carbohydrates are often helpful.  Avoid greasy, fatty, and spicy foods.  Have someone cook for you if the smell of any food causes nausea and vomiting.  If you feel nauseous after taking prenatal vitamins, take the vitamins at night or with a snack.  Snack on protein foods between meals if you are hungry. Nuts, yogurt, and cheese are good options.  Drink fluids throughout the day.  Try ginger ale made with real ginger, ginger tea made from fresh grated ginger, or ginger candies. General  instructions  Do not use any products that contain nicotine or tobacco, such as cigarettes and e-cigarettes. If you need help quitting, ask your health care provider.  Get an air purifier to keep the air in your house free of odors.  Get plenty of fresh air.  Try to avoid odors that trigger your nausea.  Consider trying these methods to help relieve symptoms: ? Wearing an acupressure wristband. These wristbands are often worn for seasickness. ? Acupuncture. Contact a health care provider if:  Your home remedies are not working and you need medicine.  You feel dizzy or light-headed.  You are losing weight. Get help right away if:  You have persistent and uncontrolled nausea and  vomiting.  You faint.  You have severe pain in your abdomen. Summary  Morning sickness is when a woman feels nauseous during pregnancy. This nauseous feeling may or may not come with vomiting.  Morning sickness is most common during the first trimester.  It often occurs in the morning, but it can be a problem at any time of day.  In many cases, treatment is not needed for this condition. Making some changes to what you eat may help to control symptoms. This information is not intended to replace advice given to you by your health care provider. Make sure you discuss any questions you have with your health care provider. Document Revised: 11/29/2017 Document Reviewed: 01/19/2017 Elsevier Patient Education  2020 [employer].

## 2020-04-15 DIAGNOSIS — O210 Mild hyperemesis gravidarum: Secondary | ICD-10-CM | POA: Diagnosis not present

## 2020-04-15 DIAGNOSIS — R112 Nausea with vomiting, unspecified: Secondary | ICD-10-CM | POA: Diagnosis not present

## 2020-04-15 DIAGNOSIS — N898 Other specified noninflammatory disorders of vagina: Secondary | ICD-10-CM | POA: Diagnosis not present

## 2020-04-15 DIAGNOSIS — N390 Urinary tract infection, site not specified: Secondary | ICD-10-CM | POA: Diagnosis not present

## 2020-04-15 DIAGNOSIS — A6000 Herpesviral infection of urogenital system, unspecified: Secondary | ICD-10-CM | POA: Insufficient documentation

## 2020-04-15 DIAGNOSIS — R102 Pelvic and perineal pain: Secondary | ICD-10-CM | POA: Diagnosis not present

## 2020-04-18 DIAGNOSIS — R102 Pelvic and perineal pain: Secondary | ICD-10-CM | POA: Diagnosis not present

## 2020-04-25 ENCOUNTER — Other Ambulatory Visit: Payer: Self-pay

## 2020-04-25 ENCOUNTER — Encounter (INDEPENDENT_AMBULATORY_CARE_PROVIDER_SITE_OTHER): Payer: Self-pay | Admitting: Family Medicine

## 2020-04-25 ENCOUNTER — Ambulatory Visit (INDEPENDENT_AMBULATORY_CARE_PROVIDER_SITE_OTHER): Payer: BC Managed Care – PPO | Admitting: Family Medicine

## 2020-04-25 VITALS — BP 118/77 | HR 89 | Temp 98.2°F | Ht 65.0 in | Wt 186.0 lb

## 2020-04-25 DIAGNOSIS — Z1331 Encounter for screening for depression: Secondary | ICD-10-CM

## 2020-04-25 DIAGNOSIS — Z0289 Encounter for other administrative examinations: Secondary | ICD-10-CM

## 2020-04-25 DIAGNOSIS — Z6831 Body mass index (BMI) 31.0-31.9, adult: Secondary | ICD-10-CM

## 2020-04-25 DIAGNOSIS — Z9189 Other specified personal risk factors, not elsewhere classified: Secondary | ICD-10-CM

## 2020-04-25 DIAGNOSIS — R7303 Prediabetes: Secondary | ICD-10-CM | POA: Diagnosis not present

## 2020-04-25 DIAGNOSIS — R5383 Other fatigue: Secondary | ICD-10-CM | POA: Diagnosis not present

## 2020-04-25 DIAGNOSIS — E559 Vitamin D deficiency, unspecified: Secondary | ICD-10-CM

## 2020-04-25 DIAGNOSIS — E669 Obesity, unspecified: Secondary | ICD-10-CM

## 2020-04-25 DIAGNOSIS — R0602 Shortness of breath: Secondary | ICD-10-CM | POA: Diagnosis not present

## 2020-04-26 LAB — LIPID PANEL WITH LDL/HDL RATIO
Cholesterol, Total: 131 mg/dL (ref 100–199)
HDL: 52 mg/dL (ref >39)
LDL Chol Calc (NIH): 64 mg/dL (ref 0–99)
LDL/HDL Ratio: 1.2 ratio (ref 0.0–3.2)
Triglycerides: 78 mg/dL (ref 0–149)
VLDL Cholesterol Cal: 15 mg/dL (ref 5–40)

## 2020-04-26 LAB — T4, FREE: Free T4: 1.13 ng/dL (ref 0.82–1.77)

## 2020-04-26 LAB — FOLATE: Folate: 7.1 ng/mL (ref >3.0)

## 2020-04-26 LAB — T3: T3, Total: 199 ng/dL — ABNORMAL HIGH (ref 71–180)

## 2020-04-26 LAB — VITAMIN B12: Vitamin B-12: 401 pg/mL (ref 232–1245)

## 2020-04-26 LAB — HEMOGLOBIN A1C
Est. average glucose Bld gHb Est-mCnc: 103 mg/dL
Hgb A1c MFr Bld: 5.2 % (ref 4.8–5.6)

## 2020-04-26 LAB — TSH: TSH: 0.734 u[IU]/mL (ref 0.450–4.500)

## 2020-04-26 LAB — VITAMIN D 25 HYDROXY (VIT D DEFICIENCY, FRACTURES): Vit D, 25-Hydroxy: 10.8 ng/mL — ABNORMAL LOW (ref 30.0–100.0)

## 2020-04-26 LAB — INSULIN, RANDOM: INSULIN: 10.5 u[IU]/mL (ref 2.6–24.9)

## 2020-04-26 NOTE — Progress Notes (Signed)
Dear Nettie Elm Rodriguez-Southworth, PA-C,   Thank you for referring Melissa [name] to our clinic. The following note includes my evaluation and treatment recommendations.  Chief Complaint:   OBESITY Melissa [name] (MR# [id number]) is a 34 y.o. female who presents for evaluation and treatment of obesity and related comorbidities. Current BMI is Body mass index is 30.95 kg/m.Melissa [name] has been struggling with her weight for many years and has been unsuccessful in either losing weight, maintaining weight loss, or reaching her healthy weight goal.  [name] is currently in the action stage of change and ready to dedicate time achieving and maintaining a healthier weight. Reaghan is interested in becoming our patient and working on intensive lifestyle modifications including (but not limited to) diet and exercise for weight loss.  [name] is [redacted] weeks pregnant. She has a history of hypertension and CVA. She is not tolerating much of anything PO secondary to hyperemesis gravidarum. She is able to tolerate minimal PO, but is still trying to eat throughout the day. She is on Reglan, Diclegis, and Zofran.  [name]'s habits were reviewed today and are as follows: Her family eats meals together at least once a week, she thinks her family will eat healthier with her, her desired weight loss is 21 lbs, she started gaining weight after having children, her heaviest weight ever was 206 pounds, she is a picky eater and doesn't like to eat healthier foods, she skips meals frequently, she is frequently drinking liquids with calories, she frequently makes poor food choices, she frequently eats larger portions than normal, she has binge eating behaviors and she struggles with emotional eating.  Depression Screen Melissa [name]'s Food and Mood (modified PHQ-9) score was 9.  Depression screen PHQ 2/9 04/25/2020  Decreased Interest 1  Down, Depressed, Hopeless 1  PHQ - 2 Score 2  Altered sleeping 2  Tired, decreased energy 2  Change in appetite 1    Feeling bad or failure about yourself  1  Trouble concentrating 1  Moving slowly or fidgety/restless 0  Suicidal thoughts 0  PHQ-9 Score 9  Difficult doing work/chores Very difficult   Subjective:   Other fatigue. Melissa [name] admits to daytime somnolence and admits to waking up still tired. Patent has a history of symptoms of daytime fatigue, morning fatigue, Epworth sleepiness scale and morning headache. [name] generally gets 3-4 hours of sleep per night, and states that she does not sleep well most nights. Snoring is not present. Apneic episodes are not present. Epworth Sleepiness Score is 23.  Shortness of breath on exertion. [name] notes increasing shortness of breath with exercising and seems to be worsening over time with weight gain. She notes getting out of breath sooner with activity than she used to. This has gotten worse recently. [name] denies shortness of breath at rest or orthopnea.  Prediabetes. [name] has a diagnosis of prediabetes based on her elevated HgA1c and was informed this puts her at greater risk of developing diabetes. She continues to work on diet and exercise to decrease her risk of diabetes. She denies nausea or hypoglycemia. Prediabetes was diagnosed last year, however, there is no A1c in Epic showing prediabetes.  Lab Results  Component Value Date   HGBA1C 5.2 04/25/2020   Lab Results  Component Value Date   INSULIN WILL FOLLOW 04/25/2020   Vitamin D deficiency. Melissa [name] is not on Vitamin D supplementation. She endorses fatigue.  Depression screening. Melissa [name] had a mildly positive depression screen with a PHQ-9 score of 9.  At  risk for diabetes mellitus. [name] is at higher than average risk for developing diabetes due to her obesity.   Assessment/Plan:   Other fatigue. Luvenia does feel that her weight is causing her energy to be lower than it should be. Fatigue may be related to obesity, depression or many other causes. Labs will be ordered, and in the meanwhile, [name] will focus on self  care including making healthy food choices, increasing physical activity and focusing on stress reduction.   EKG 12-Lead, Vitamin B12, Folate, T3, T4, free, TSH testing ordered today.  Shortness of breath on exertion. [name] does feel that she gets out of breath more easily that she used to when she exercises. Melissa [name]'s shortness of breath appears to be obesity related and exercise induced. She has agreed to work on weight loss and gradually increase exercise to treat her exercise induced shortness of breath. Will continue to monitor closely. Lipid Panel With LDL/HDL Ratio ordered today.  Prediabetes. Melissa [name] will continue to work on weight loss, exercise, and decreasing simple carbohydrates to help decrease the risk of diabetes. Hemoglobin A1c, Insulin, random labs ordered today.  Vitamin D deficiency. Low Vitamin D level contributes to fatigue and are associated with obesity, breast, and colon cancer. VITAMIN D 25 Hydroxy (Vit-D Deficiency, Fractures) level ordered today.  Depression screening. Melissa [name] had a positive depression screening. Depression is commonly associated with obesity and often results in emotional eating behaviors. We will monitor this closely and work on CBT to help improve the non-hunger eating patterns. Referral to Psychology may be required if no improvement is seen as she continues in our clinic.  At risk for diabetes mellitus. Melissa [name] was given approximately 15 minutes of diabetes education and counseling today. We discussed intensive lifestyle modifications today with an emphasis on weight loss as well as increasing exercise and decreasing simple carbohydrates in her diet. We also reviewed medication options with an emphasis on risk versus benefit of those discussed.   Repetitive spaced learning was employed today to elicit superior memory formation and behavioral change.  Class 1 obesity with serious comorbidity and body mass index (BMI) of 31.0 to 31.9 in adult, unspecified obesity  type.  Melissa [name] is currently in the action stage of change and her goal is to continue with weight loss efforts. I recommend Clatie begin the structured treatment plan as follows:  She has agreed to keeping a food journal and adhering to recommended goals of 1200-1400 calories and 85+ grams of protein daily.  Exercise goals: No exercise has been prescribed at this time.   Behavioral modification strategies: increasing lean protein intake, increasing vegetables, meal planning and cooking strategies, keeping healthy foods in the home and planning for success.  She was informed of the importance of frequent follow-up visits to maximize her success with intensive lifestyle modifications for her multiple health conditions. She was informed we would discuss her lab results at her next visit unless there is a critical issue that needs to be addressed sooner. Gabriele agreed to keep her next visit at the agreed upon time to discuss these results.  Objective:   Blood pressure 118/77, pulse 89, temperature 98.2 F (36.8 C), temperature source Oral, height 5\' 5"  (1.651 m), weight 186 lb (84.4 kg), last menstrual period 02/19/2020, SpO2 95 %, unknown if currently breastfeeding. Body mass index is 30.95 kg/m.  EKG: Normal sinus rhythm, rate 84. Within normal limits.  Indirect Calorimeter completed today shows a VO2 of 206 and a REE of 1433.  Her calculated basal  metabolic rate is 9604 thus her basal metabolic rate is worse than expected.  General: Cooperative, alert, well developed, in no acute distress. HEENT: Conjunctivae and lids unremarkable. Cardiovascular: Regular rhythm.  Lungs: Normal work of breathing. Neurologic: No focal deficits.   Lab Results  Component Value Date   CREATININE 0.73 04/13/2020   BUN 6 04/13/2020   NA 136 04/13/2020   K 3.8 04/13/2020   CL 105 04/13/2020   CO2 21 (L) 04/13/2020   Lab Results  Component Value Date   ALT 12 04/13/2020   AST 19 04/13/2020   ALKPHOS 45  04/13/2020   BILITOT 1.2 04/13/2020   Lab Results  Component Value Date   HGBA1C 5.2 04/25/2020   HGBA1C 5.5 02/24/2020   HGBA1C 5.6 03/05/2019   Lab Results  Component Value Date   INSULIN WILL FOLLOW 04/25/2020   Lab Results  Component Value Date   TSH 0.734 04/25/2020   Lab Results  Component Value Date   CHOL 131 04/25/2020   HDL 52 04/25/2020   LDLCALC 64 04/25/2020   TRIG 78 04/25/2020   CHOLHDL 2.4 02/24/2020   Lab Results  Component Value Date   WBC 7.7 03/21/2020   HGB 12.0 03/21/2020   HCT 35.9 (L) 03/21/2020   MCV 85.7 03/21/2020   PLT 200 03/21/2020   No results found for: IRON, TIBC, FERRITIN  Attestation Statements:   This is the patient's first visit at [facility]. The patient's NEW PATIENT PACKET was reviewed at length. Included in the packet: current and past health history, medications, allergies, ROS, gynecologic history (women only), surgical history, family history, social history, weight history, weight loss surgery history (for those that have had weight loss surgery), nutritional evaluation, mood and food questionnaire, PHQ9, Epworth questionnaire, sleep habits questionnaire, patient life and health improvement goals questionnaire. These will all be scanned into the patient's chart under media.   During the visit, I independently reviewed the patient's EKG, bioimpedance scale results, and indirect calorimeter results. I used this information to tailor a meal plan for the patient that will help her to lose weight and will improve her obesity-related conditions going forward. I performed a medically necessary appropriate examination and/or evaluation. I discussed the assessment and treatment plan with the patient. The patient was provided an opportunity to ask questions and all were answered. The patient agreed with the plan and demonstrated an understanding of the instructions. Labs were ordered at this visit and will be reviewed at the  next visit unless more critical results need to be addressed immediately. Clinical information was updated and documented in the EMR.   Time spent on visit including pre-visit chart review and post-visit care was 45 minutes.   A separate 15 minutes was spent on risk counseling (see above).    I, Michaelene Song, am acting as transcriptionist for Coralie Common, MD   I have reviewed the above documentation for accuracy and completeness, and I agree with the above. - Ilene Qua, MD

## 2020-04-28 DIAGNOSIS — R112 Nausea with vomiting, unspecified: Secondary | ICD-10-CM | POA: Diagnosis not present

## 2020-04-28 DIAGNOSIS — N898 Other specified noninflammatory disorders of vagina: Secondary | ICD-10-CM | POA: Diagnosis not present

## 2020-04-28 DIAGNOSIS — R52 Pain, unspecified: Secondary | ICD-10-CM | POA: Diagnosis not present

## 2020-04-28 DIAGNOSIS — O200 Threatened abortion: Secondary | ICD-10-CM | POA: Diagnosis not present

## 2020-04-29 ENCOUNTER — Encounter: Payer: Self-pay | Admitting: Physician Assistant

## 2020-04-29 ENCOUNTER — Ambulatory Visit: Payer: BC Managed Care – PPO | Admitting: Physician Assistant

## 2020-04-29 ENCOUNTER — Other Ambulatory Visit: Payer: Self-pay

## 2020-04-29 VITALS — BP 130/78 | HR 80 | Ht 65.0 in | Wt 189.2 lb

## 2020-04-29 DIAGNOSIS — J4530 Mild persistent asthma, uncomplicated: Secondary | ICD-10-CM | POA: Diagnosis not present

## 2020-04-29 DIAGNOSIS — O2341 Unspecified infection of urinary tract in pregnancy, first trimester: Secondary | ICD-10-CM

## 2020-04-29 DIAGNOSIS — R079 Chest pain, unspecified: Secondary | ICD-10-CM

## 2020-04-29 NOTE — Progress Notes (Signed)
Cardiology Office Note    Date:  04/29/2020   ID:  Melissa [name] May 17, 1986, MRN [id number]  PCP:  Garey Ham, PA-C  Cardiologist:  Dr. Tenny Craw  Chief Complaint: L axillary pain   History of Present Illness:   Melissa [name] is a 34 y.o. female referred by OB/GYN for evaluation of axillary intermittent pain and questionable murmur.  Patient has history of asthma. Admitted February 2020 with active chest pain and EKG showing inferior lateral ST elevation. No family history of CAD. She was taken to Cath Lab and initially an attempt to radial access was unsuccessful because of spasm. Cath via groin access showed no evidence of coronary artery disease. Pain relieved with morphine. Echocardiogram with preserved LV function. Dr. Tenny Craw did not fall symptoms related to pericarditis but could be costochondritis. No hospital follow-up due to pandemic.  Patient is 3 months pregnant. This is her fourth pregnancy. No prior pregnancy complications. Patient dealing with severe nausea and dehydration during this pregnancy. Unable to tolerate any fluids. Last week she was diagnosed with UTI and unable to take p.o. antibiotic as well. Eventually got IV antibiotic. Has high T3 but normal T4 and TSH. EKG with normal sinus rhythm. I have reviewed records personally. No evidence of dehydration by basic metabolic panel. Patient reports intermittent left axillary pain. Occasional "catching breath". No sustained palpitation, dizziness or syncope.   Past Medical History:  Diagnosis Date  . Abnormal Pap smear 2010   Repeat pap done;Hysteroscopy;Last pap 2012;was normal  . Anemia 2010   Iron supplements PP  . Asthma    Has a nebulizer;triggered by stress  . Chest pain   . Colon polyps   . Headache(784.0)   . Heart attack [facility])    MD thinks may have had a heart attack;Cardiologist @ [facility]  . History of CVA (cerebrovascular accident) 04/09/2013   Pt reports presumptive "stroke" when she was  34y.o.; followed by [facility] neurology for "chronic nerve pain" and has been Rx'd Hydrocodone and Mobic  . Hypertension   . Infection    BV;may get frequently  . Multiple food allergies   . Nerve pain    Chronic;d/t possible stroke and MI  . Placenta previa antepartum 2010   Placenta moved 3 days before baby was born  . Pre-diabetes   . Shortness of breath   . Sickle cell trait (HCC)   . Stroke [facility]) 2013   MD thought may have had a stroke and heart attack;has neurologist @ [facility]  . Vitamin D deficiency     Past Surgical History:  Procedure Laterality Date  . HYSTEROSCOPY  2010  . LEFT HEART CATH AND CORONARY ANGIOGRAPHY N/A 02/18/2019   Procedure: LEFT HEART CATH AND CORONARY ANGIOGRAPHY;  Surgeon: [name] Lex, MD;  Location: [facility] INVASIVE CV LAB;  Service: Cardiovascular;  Laterality: N/A;    Current Medications: Prior to Admission medications   Medication Sig Start Date End Date Taking? Authorizing Provider  albuterol (PROVENTIL HFA;VENTOLIN HFA) 108 (90 BASE) MCG/ACT inhaler Inhale 2 puffs into the lungs every 4 (four) hours as needed for wheezing or shortness of breath. 08/29/15  Yes Simonne Martinet, NP  albuterol (PROVENTIL) (2.5 MG/3ML) 0.083% nebulizer solution Take 2.5 mg by nebulization every 4 (four) hours as needed for wheezing or shortness of breath.   Yes [provider]  Doxylamine-Pyridoxine (DICLEGIS) 10-10 MG TBEC Take 1 tablet by mouth 2 (two) times daily as needed (nausea). 03/19/20  Yes Dione Booze, MD  fluticasone (FLONASE) 50 MCG/ACT nasal spray SPRAY 2 SPRAYS INTO EACH NOSTRIL EVERY DAY 08/03/19  Yes Rodriguez-Southworth, Nettie Elm, PA-C  metoCLOPramide (REGLAN) 10 MG tablet Take 1 tablet (10 mg total) by mouth every 6 (six) hours as needed for nausea (or headache). 03/19/20  Yes Dione Booze, MD  mometasone-formoterol (DULERA) 100-5 MCG/ACT AERO Inhale 2 puffs into the lungs 2 (two) times a day. 05/27/19  Yes Nyoka Cowden, MD    ondansetron (ZOFRAN ODT) 4 MG disintegrating tablet Take 1 tablet (4 mg total) by mouth every 8 (eight) hours as needed for nausea or vomiting. 03/17/20  Yes Antony Madura, PA-C  Prenatal Vit-Fe Fumarate-FA (MULTIVITAMIN-PRENATAL) 27-0.8 MG TABS tablet Take 1 tablet by mouth daily at 12 noon. 03/19/20  Yes Dione Booze, MD    Allergies:   Ginger   Social History   Socioeconomic History  . Marital status: Single    Spouse name: Not on file  . Number of children: 3  . Years of education: 57  . Highest education level: Not on file  Occupational History  . Not on file  Tobacco Use  . Smoking status: Never Smoker  . Smokeless tobacco: Never Used  Substance and Sexual Activity  . Alcohol use: Not Currently    Alcohol/week: 0.0 standard drinks    Comment: last use 03/16/2020  . Drug use: No  . Sexual activity: Yes    Birth control/protection: None  Other Topics Concern  . Not on file  Social History Narrative   Lives at home with her three children.   Right-handed.   1 cup caffeine daily.   Social Determinants of Health   Financial Resource Strain:   . Difficulty of Paying Living Expenses:   Food Insecurity:   . Worried About Programme researcher, broadcasting/film/video in the Last Year:   . Barista in the Last Year:   Transportation Needs:   . Freight forwarder (Medical):   [name] Lack of Transportation (Non-Medical):   Physical Activity:   . Days of Exercise per Week:   . Minutes of Exercise per Session:   Stress:   . Feeling of Stress :   Social Connections:   . Frequency of Communication with Friends and Family:   . Frequency of Social Gatherings with Friends and Family:   . Attends Religious Services:   . Active Member of Clubs or Organizations:   . Attends Banker Meetings:   [name] Marital Status:      Family History:  The patient's family history includes Anemia in her mother; Asthma in her brother, daughter, mother, and son; Cervical cancer in her paternal aunt and  paternal grandmother; Diabetes in her mother, paternal aunt, paternal grandfather, and paternal grandmother; Hypertension in her mother; Sickle cell trait in her father.  ROS:   Please see the history of present illness.    ROS All other systems reviewed and are negative.   PHYSICAL EXAM:   VS:  BP 130/78   Pulse 80   Ht 5\' 5"  (1.651 m)   Wt 189 lb 3.2 oz (85.8 kg)   LMP 02/19/2020   SpO2 97%   BMI 31.48 kg/m    GEN: Well nourished, well developed, in no acute distress  HEENT: normal  Neck: no JVD, carotid bruits, or masses Cardiac: RRR; no murmurs, rubs, or gallops,no edema  Respiratory:  clear to auscultation bilaterally, normal work of breathing GI: soft, nontender, nondistended, + BS MS: no deformity or atrophy  Skin: warm  and dry, no rash Neuro:  Alert and Oriented x 3, Strength and sensation are intact Psych: euthymic mood, full affect  Wt Readings from Last 3 Encounters:  04/29/20 189 lb 3.2 oz (85.8 kg)  04/25/20 186 lb (84.4 kg)  03/21/20 206 lb 4 oz (93.6 kg)      Studies/Labs Reviewed:   EKG:  EKG is not  ordered today.    Recent Labs: 03/21/2020: Hemoglobin 12.0; Platelets 200 04/13/2020: ALT 12; BUN 6; Creatinine, Ser 0.73; Potassium 3.8; Sodium 136 04/25/2020: TSH 0.734   Lipid Panel    Component Value Date/Time   CHOL 131 04/25/2020 1254   TRIG 78 04/25/2020 1254   HDL 52 04/25/2020 1254   CHOLHDL 2.4 02/24/2020 0947   CHOLHDL 2.4 02/18/2019 1548   VLDL 7 02/18/2019 1548   LDLCALC 64 04/25/2020 1254    Additional studies/ records that were reviewed today include:   LEFT HEART CATH AND CORONARY ANGIOGRAPHY 02/18/19  Conclusion    LV end diastolic pressure is normal.  The left ventricular systolic function is normal.  There is no aortic valve stenosis.  The left ventricular ejection fraction is 55-65% by visual estimate.  SUMMARY  Angiographically normal coronary arteries  Normal LVEF and EDP. No regional wall motion normality on LV  gram.  RECOMMENDATIONS  Admit to telemetry with presumed diagnosis of pericarditis.  Check 2D echocardiogram and inflammatory markers.  Start colchicine and ibuprofen.  Monitor closely for exacerbation of respiratory issues.  Glenetta Hew, MD  _____________  Echocardiogram 02/19/19 IMPRESSIONS 1. The left ventricle has normal systolic function with an ejection fraction of 60-65%. The cavity size was normal. Left ventricular diastolic parameters were normal No evidence of left ventricular regional wall motion abnormalities. 2. The right ventricle has normal systolic function. The cavity was normal. There is no increase in right ventricular wall thickness. 3. The mitral valve is normal in structure. 4. The tricuspid valve is normal in structure. 5. The aortic valve is normal in structure. 6. The pulmonic valve was normal in structure. 7. No evidence of left ventricular regional wall motion abnormalities.  FINDINGS Left Ventricle: The left ventricle has normal systolic function, with an ejection fraction of 60-65%. The cavity size was normal. There is no increase in left ventricular wall thickness. Left ventricular diastolic parameters were normal No evidence of  left ventricular regional wall motion abnormalities.. Right Ventricle: The right ventricle has normal systolic function. The cavity was normal. There is no increase in right ventricular wall thickness. Left Atrium: left atrial size was normal in size Right Atrium: right atrial size was normal in size. Interatrial Septum: No atrial level shunt detected by color flow Doppler. Pericardium: There is no evidence of pericardial effusion. Mitral Valve: The mitral valve is normal in structure. Mitral valve regurgitation is trivial by color flow Doppler. Tricuspid Valve: The tricuspid valve is normal in structure. Tricuspid valve regurgitation was not visualized by color flow Doppler. Aortic Valve: The aortic valve is  normal in structure. Aortic valve regurgitation was not visualized by color flow Doppler. Pulmonic Valve: The pulmonic valve was normal in structure. Pulmonic valve regurgitation is not visualized by color flow Doppler. Venous: The inferior vena cava is normal in size with greater than 50% respiratory variability.        ASSESSMENT & PLAN:    1. Prior history of inferior lateral STEMI versus pericarditis versus costochondritis 2. Pregnancy 3. Asthma 4. Recent UTI  Patient has normal coronaries by cardiac catheterization year ago. Echocardiogram with  preserved LV function and no valvular abnormality. Seems she has rare PAC or PVC in setting of UTI and for p.o. intake. I have encouraged small meal and sips of water. No murmur heard on exam. Given reassuring work-up last year no further cardiac evaluation needed. Advised to get pulse ox to check heart rate. If symptoms persisted despite correcting dehydration she will give [place] a call and may require monitor at that time.   Medication Adjustments/Labs and Tests Ordered: Current medicines are reviewed at length with the patient today.  Concerns regarding medicines are outlined above.  Medication changes, Labs and Tests ordered today are listed in the Patient Instructions below. Patient Instructions  Medication Instructions:  Your physician recommends that you continue on your current medications as directed. Please refer to the Current Medication list given to you today.  *If you need a refill on your cardiac medications before your next appointment, please call your pharmacy*   Lab Work: None ordered  If you have labs (blood work) drawn today and your tests are completely normal, you will receive your results only by: [name] MyChart Message (if you have MyChart) OR . A paper copy in the mail If you have any lab test that is abnormal or we need to change your treatment, we will call you to review the results.   Testing/Procedures: None  ordered   Follow-Up: At [facility], you and your health needs are our priority.  As part of our continuing mission to provide you with exceptional heart care, we have created designated Provider Care Teams.  These Care Teams include your primary Cardiologist (physician) and Advanced Practice Providers (APPs -  Physician Assistants and Nurse Practitioners) who all work together to provide you with the care you need, when you need it.  We recommend signing up for the patient portal called "MyChart".  Sign up information is provided on this After Visit Summary.  MyChart is used to connect with patients for Virtual Visits (Telemedicine).  Patients are able to view lab/test results, encounter notes, upcoming appointments, etc.  Non-urgent messages can be sent to your provider as well.   To learn more about what you can do with MyChart, go to [email or web address].    Your next appointment:   AS NEEDED  The format for your next appointment:   In Person  Provider:   You may see Dietrich Pates, MD or one of the following Advanced Practice Providers on your designated Care Team:    Tereso Newcomer, PA-C  Chelsea Aus, PA-C    Other Instructions      Signed, Manson Passey, [place]  04/29/2020 8:25 AM    [facility] [address] [place], [place], [place]  [address] Phone: [phone]; Fax: [phone]

## 2020-04-29 NOTE — Patient Instructions (Addendum)
Medication Instructions:  Your physician recommends that you continue on your current medications as directed. Please refer to the Current Medication list given to you today.  *If you need a refill on your cardiac medications before your next appointment, please call your pharmacy*   Lab Work: None ordered  If you have labs (blood work) drawn today and your tests are completely normal, you will receive your results only by: . MyChart Message (if you have MyChart) OR . A paper copy in the mail If you have any lab test that is abnormal or we need to change your treatment, we will call you to review the results.   Testing/Procedures: None ordered   Follow-Up: At [facility], you and your health needs are our priority.  As part of our continuing mission to provide you with exceptional heart care, we have created designated Provider Care Teams.  These Care Teams include your primary Cardiologist (physician) and Advanced Practice Providers (APPs -  Physician Assistants and Nurse Practitioners) who all work together to provide you with the care you need, when you need it.  We recommend signing up for the patient portal called "MyChart".  Sign up information is provided on this After Visit Summary.  MyChart is used to connect with patients for Virtual Visits (Telemedicine).  Patients are able to view lab/test results, encounter notes, upcoming appointments, etc.  Non-urgent messages can be sent to your provider as well.   To learn more about what you can do with MyChart, go to [email or web address].    Your next appointment:   AS NEEDED   The format for your next appointment:   In Person  Provider:   You may see Paula Ross, MD or one of the following Advanced Practice Providers on your designated Care Team:    Scott Weaver, PA-C  Vin Bhagat, PA-C    Other Instructions   

## 2020-05-05 ENCOUNTER — Encounter (HOSPITAL_COMMUNITY): Payer: Self-pay | Admitting: Obstetrics & Gynecology

## 2020-05-05 ENCOUNTER — Inpatient Hospital Stay (HOSPITAL_COMMUNITY)
Admission: AD | Admit: 2020-05-05 | Discharge: 2020-05-05 | Disposition: A | Discharge status: Home / Self Care | Payer: BC Managed Care – PPO | Source: Ambulatory Visit | Attending: Obstetrics & Gynecology | Admitting: Obstetrics & Gynecology

## 2020-05-05 ENCOUNTER — Other Ambulatory Visit: Payer: Self-pay

## 2020-05-05 DIAGNOSIS — Z3A12 12 weeks gestation of pregnancy: Secondary | ICD-10-CM | POA: Insufficient documentation

## 2020-05-05 DIAGNOSIS — O26851 Spotting complicating pregnancy, first trimester: Secondary | ICD-10-CM | POA: Diagnosis not present

## 2020-05-05 DIAGNOSIS — N925 Other specified irregular menstruation: Secondary | ICD-10-CM | POA: Diagnosis not present

## 2020-05-05 DIAGNOSIS — O218 Other vomiting complicating pregnancy: Secondary | ICD-10-CM | POA: Diagnosis not present

## 2020-05-05 DIAGNOSIS — O210 Mild hyperemesis gravidarum: Secondary | ICD-10-CM | POA: Insufficient documentation

## 2020-05-05 DIAGNOSIS — R52 Pain, unspecified: Secondary | ICD-10-CM | POA: Diagnosis not present

## 2020-05-05 LAB — URINALYSIS, ROUTINE W REFLEX MICROSCOPIC
Bilirubin Urine: NEGATIVE
Bilirubin Urine: NEGATIVE
Glucose, UA: >=500 mg/dL — AB
Glucose, UA: 50 mg/dL — AB
Hgb urine dipstick: NEGATIVE
Hgb urine dipstick: NEGATIVE
Ketones, ur: 20 mg/dL — AB
Ketones, ur: NEGATIVE mg/dL
Leukocytes,Ua: NEGATIVE
Nitrite: NEGATIVE
Nitrite: NEGATIVE
Protein, ur: NEGATIVE mg/dL
Protein, ur: NEGATIVE mg/dL
Specific Gravity, Urine: 1.007 (ref 1.005–1.030)
Specific Gravity, Urine: 1.008 (ref 1.005–1.030)
pH: 6 (ref 5.0–8.0)
pH: 8 (ref 5.0–8.0)

## 2020-05-05 MED ORDER — DEXTROSE 5 % IN LACTATED RINGERS IV BOLUS
1000.0000 mL | Freq: Once | INTRAVENOUS | Status: AC
  Administered 2020-05-05: 17:00:00 1000 mL via INTRAVENOUS

## 2020-05-05 MED ORDER — DEXTROSE 5 % IN LACTATED RINGERS IV BOLUS
1000.0000 mL | Freq: Once | INTRAVENOUS | Status: AC
  Administered 2020-05-05: 16:00:00 1000 mL via INTRAVENOUS

## 2020-05-05 MED ORDER — DEXTROSE IN LACTATED RINGERS 5 % IV SOLN: INTRAVENOUS | Status: DC

## 2020-05-05 MED ORDER — SODIUM CHLORIDE 0.9 % IV SOLN
8.0000 mg | Freq: Once | INTRAVENOUS | Status: AC
  Administered 2020-05-05: 20:00:00 8 mg via INTRAVENOUS
  Filled 2020-05-05: qty 4

## 2020-05-05 MED ORDER — PROMETHAZINE HCL 25 MG/ML IJ SOLN
12.5000 mg | Freq: Four times a day (QID) | INTRAMUSCULAR | Status: DC | PRN
  Administered 2020-05-05: 12.5 mg via INTRAVENOUS
  Filled 2020-05-05: qty 1

## 2020-05-05 MED ORDER — ONDANSETRON 4 MG PO TBDP
8.0000 mg | ORAL_TABLET | Freq: Three times a day (TID) | ORAL | Status: DC | PRN
  Administered 2020-05-05: 16:00:00 8 mg via ORAL
  Filled 2020-05-05: qty 2

## 2020-05-05 NOTE — MAU Note (Deleted)
Is experiencing really bad cramping and heavy bleeding, started this morning.

## 2020-05-05 NOTE — Discharge Instructions (Signed)
Hyperemesis Gravidarum Hyperemesis gravidarum is a severe form of nausea and vomiting that happens during pregnancy. Hyperemesis is worse than morning sickness. It may cause you to have nausea or vomiting all day for many days. It may keep you from eating and drinking enough food and liquids, which can lead to dehydration, malnutrition, and weight loss. Hyperemesis usually occurs during the first half (the first 20 weeks) of pregnancy. It often goes away once a woman is in her second half of pregnancy. However, sometimes hyperemesis continues through an entire pregnancy. What are the causes? The cause of this condition is not known. It may be related to changes in chemicals (hormones) in the body during pregnancy, such as the high level of pregnancy hormone (human chorionic gonadotropin) or the increase in the female sex hormone (estrogen). What are the signs or symptoms? Symptoms of this condition include:  Nausea that does not go away.  Vomiting that does not allow you to keep any food down.  Weight loss.  Body fluid loss (dehydration).  Having no desire to eat, or not liking food that you have previously enjoyed. How is this diagnosed? This condition may be diagnosed based on:  A physical exam.  Your medical history.  Your symptoms.  Blood tests.  Urine tests. How is this treated? This condition is managed by controlling symptoms. This may include:  Following an eating plan. This can help lessen nausea and vomiting.  Taking prescription medicines. An eating plan and medicines are often used together to help control symptoms. If medicines do not help relieve nausea and vomiting, you may need to receive fluids through an IV at the hospital. Follow these instructions at home: Eating and drinking   Avoid the following: ? Drinking fluids with meals. Try not to drink anything during the 30 minutes before and after your meals. ? Drinking more than 1 cup of fluid at a  time. ? Eating foods that trigger your symptoms. These may include spicy foods, coffee, high-fat foods, very sweet foods, and acidic foods. ? Skipping meals. Nausea can be more intense on an empty stomach. If you cannot tolerate food, do not force it. Try sucking on ice chips or other frozen items and make up for missed calories later. ? Lying down within 2 hours after eating. ? Being exposed to environmental triggers. These may include food smells, smoky rooms, closed spaces, rooms with strong smells, warm or humid places, overly loud and noisy rooms, and rooms with motion or flickering lights. Try eating meals in a well-ventilated area that is free of strong smells. ? Quick and sudden changes in your movement. ? Taking iron pills and multivitamins that contain iron. If you take prescription iron pills, do not stop taking them unless your health care provider approves. ? Preparing food. The smell of food can spoil your appetite or trigger nausea.  To help relieve your symptoms: ? Listen to your body. Everyone is different and has different preferences. Find what works best for you. ? Eat and drink slowly. ? Eat 5-6 small meals daily instead of 3 large meals. Eating small meals and snacks can help you avoid an empty stomach. ? In the morning, before getting out of bed, eat a couple of crackers to avoid moving around on an empty stomach. ? Try eating starchy foods as these are usually tolerated well. Examples include cereal, toast, bread, potatoes, pasta, rice, and pretzels. ? Include at least 1 serving of protein with your meals and snacks. Protein options include   lean meats, poultry, seafood, beans, nuts, nut butters, eggs, cheese, and yogurt. ? Try eating a protein-rich snack before bed. Examples of a protein-rick snack include cheese and crackers or a peanut butter sandwich made with 1 slice of whole-wheat bread and 1 tsp (5 g) of peanut butter. ? Eat or suck on things that have ginger in them.  It may help relieve nausea. Add  tsp ground ginger to hot tea or choose ginger tea. ? Try drinking 100% fruit juice or an electrolyte drink. An electrolyte drink contains sodium, potassium, and chloride. ? Drink fluids that are cold, clear, and carbonated or sour. Examples include lemonade, ginger ale, lemon-lime soda, ice water, and sparkling water. ? Brush your teeth or use a mouth rinse after meals. ? Talk with your health care provider about starting a supplement of vitamin B6. General instructions  Take over-the-counter and prescription medicines only as told by your health care provider.  Follow instructions from your health care provider about eating or drinking restrictions.  Continue to take your prenatal vitamins as told by your health care provider. If you are having trouble taking your prenatal vitamins, talk with your health care provider about different options.  Keep all follow-up and pre-birth (prenatal) visits as told by your health care provider. This is important. Contact a health care provider if:  You have pain in your abdomen.  You have a severe headache.  You have vision problems.  You are losing weight.  You feel weak or dizzy. Get help right away if:  You cannot drink fluids without vomiting.  You vomit blood.  You have constant nausea and vomiting.  You are very weak.  You faint.  You have a fever and your symptoms suddenly get worse. Summary  Hyperemesis gravidarum is a severe form of nausea and vomiting that happens during pregnancy.  Making some changes to your eating habits may help relieve nausea and vomiting.  This condition may be managed with medicine.  If medicines do not help relieve nausea and vomiting, you may need to receive fluids through an IV at the hospital. This information is not intended to replace advice given to you by your health care provider. Make sure you discuss any questions you have with your health care  provider. Document Revised: 01/06/2018 Document Reviewed: 08/15/2016 Elsevier Patient Education  2020 Elsevier Inc.  

## 2020-05-05 NOTE — MAU Note (Signed)
S: Melissa [name], 34 y.o., [phone], [redacted]w[redacted]d presents to MAU w/ c/o N/V unresolved w/ PO Phenergan, Reglan, Compazine, and Zofran OTD. Pt was seen in office today, ketonuria present, sent to MAU for IV hydration and antiemetics.   O: BP 113/68 (BP Location: Right Arm)   Pulse 84   Temp 99.5 F (37.5 C) (Oral)   Resp 18   Ht 5\' 5"  (1.651 m)   Wt 86.1 kg   LMP 02/19/2020   SpO2 100%   BMI 31.60 kg/m   Labs UA pending   Past medical history:  Past Medical History:  Diagnosis Date  . Abnormal Pap smear 2010   Repeat pap done;Hysteroscopy;Last pap 2012;was normal  . Anemia 2010   Iron supplements PP  . Asthma    Has a nebulizer;triggered by stress  . Chest pain   . Colon polyps   . Headache(784.0)   . Heart attack [facility])    MD thinks may have had a heart attack;Cardiologist @ [facility]  . History of CVA (cerebrovascular accident) 04/09/2013   Pt reports presumptive "stroke" when she was 34y.o.; followed by [facility] neurology for "chronic nerve pain" and has been Rx'd Hydrocodone and Mobic  . Hypertension   . Infection    BV;may get frequently  . Multiple food allergies   . Nerve pain    Chronic;d/t possible stroke and MI  . Placenta previa antepartum 2010   Placenta moved 3 days before baby was born  . Pre-diabetes   . Shortness of breath   . Sickle cell trait (HCC)   . Stroke [facility]) 2013   MD thought may have had a stroke and heart attack;has neurologist @ [facility]  . Vitamin D deficiency     Past surgical history:  Past Surgical History:  Procedure Laterality Date  . HYSTEROSCOPY  2010  . LEFT HEART CATH AND CORONARY ANGIOGRAPHY N/A 02/18/2019   Procedure: LEFT HEART CATH AND CORONARY ANGIOGRAPHY;  Surgeon: 02/20/2019, MD;  Location: [facility] INVASIVE CV LAB;  Service: Cardiovascular;  Laterality: N/A;   Family History:  Family History  Problem Relation Age of Onset  . Sickle cell trait Father   . Hypertension Mother   . Anemia Mother   . Asthma Mother    . Diabetes Mother   . Cervical cancer Paternal Aunt   . Cervical cancer Paternal Grandmother   . Diabetes Paternal Grandmother   . Diabetes Paternal Grandfather   . Diabetes Paternal Aunt   . Asthma Brother   . Asthma Daughter   . Asthma Son     Social History:  reports that she has never smoked. She has never used smokeless tobacco. She reports previous alcohol use. She reports that she does not use drugs.  Physical exam: Gen: AO x3, cooperative, no distress CV: RRR Resp: clear GI: active BS x4, soft, gravid, non-tender GU: neg for bleeding Ext: No edema, pain, tenderness, or cords FHT: 148 BPM   A: Melissa [name] 34 y.o. [facility] [redacted]w[redacted]d Hyperemesis  P: IV hydration Antiemetics PO challenge   Plan reviewed with Dr. [redacted]w[redacted]d MSN, CNM 05/05/2020 6:00 PM

## 2020-05-05 NOTE — MAU Note (Signed)
Dr sent her in for ongoing n/v- dehydration after appointment today. On meds, just not helping.

## 2020-05-05 NOTE — Discharge Summary (Signed)
S:  Feeling better since IV Zofran, tolerated crackers and juice w/o vomiting, nausea has improved. Requesting discharge to attend mother's graduation tomorrow morning. Pt will follow-up at next sched appt 05/16/20 and will call provider if symptoms persist.    O: Blood pressure 109/61, pulse 88, temperature 99.1 F (37.3 C), temperature source Oral, resp. rate 18, height 5\' 5"  (1.651 m), weight 86.1 kg, last menstrual period 02/19/2020, SpO2 100 % Results for HILDEGARD, HLAVAC (MRN Riki Altes) as of 05/05/2020 20:46  Ref. Range 04/13/2020 23:56 04/25/2020 08:18 04/25/2020 12:54 05/05/2020 17:04 05/05/2020 20:20  URINALYSIS, ROUTINE W REFLEX MICROSCOPIC Unknown    Rpt (A) Rpt (A)  Appearance Latest Ref Range: CLEAR     HAZY (A) CLEAR  Bilirubin Urine Latest Ref Range: NEGATIVE     NEGATIVE NEGATIVE  Color, Urine Latest Ref Range: YELLOW     YELLOW YELLOW  Glucose, UA Latest Ref Range: NEGATIVE mg/dL    07/05/2020 (A) 50 (A)  Hgb urine dipstick Latest Ref Range: NEGATIVE     NEGATIVE NEGATIVE  Ketones, ur Latest Ref Range: NEGATIVE mg/dL    20 (A) NEGATIVE  Leukocytes,Ua Latest Ref Range: NEGATIVE     TRACE (A) NEGATIVE  Nitrite Latest Ref Range: NEGATIVE     NEGATIVE NEGATIVE  pH Latest Ref Range: 5.0 - 8.0     6.0 8.0  Protein Latest Ref Range: NEGATIVE mg/dL    NEGATIVE NEGATIVE  Specific Gravity, Urine Latest Ref Range: 1.005 - 1.030     1.007 1.008    A/P: Melissa [name] 33 y.o. 04-15-2001 [redacted]w[redacted]d Hyperemesis      -ketonuria resolved     -continue home meds: Zofran, Reglan, Compazine, Diclegis     -start w/ bland diet and advance as tolerated Discharge home     -F/U with [facility] as scheduled 05/16/20  05/18/20, MSN, CNM 05/05/2020 9:32 PM

## 2020-05-09 ENCOUNTER — Ambulatory Visit (INDEPENDENT_AMBULATORY_CARE_PROVIDER_SITE_OTHER): Payer: BC Managed Care – PPO | Admitting: Family Medicine

## 2020-05-09 ENCOUNTER — Encounter (INDEPENDENT_AMBULATORY_CARE_PROVIDER_SITE_OTHER): Payer: Self-pay | Admitting: Family Medicine

## 2020-05-09 ENCOUNTER — Other Ambulatory Visit: Payer: Self-pay

## 2020-05-09 VITALS — BP 103/68 | HR 94 | Temp 98.2°F | Ht 65.0 in | Wt 186.0 lb

## 2020-05-09 DIAGNOSIS — Z6831 Body mass index (BMI) 31.0-31.9, adult: Secondary | ICD-10-CM | POA: Diagnosis not present

## 2020-05-09 DIAGNOSIS — E559 Vitamin D deficiency, unspecified: Secondary | ICD-10-CM

## 2020-05-09 DIAGNOSIS — E669 Obesity, unspecified: Secondary | ICD-10-CM | POA: Diagnosis not present

## 2020-05-09 DIAGNOSIS — E8881 Metabolic syndrome: Secondary | ICD-10-CM | POA: Diagnosis not present

## 2020-05-10 NOTE — Progress Notes (Signed)
Chief Complaint:   OBESITY Melissa [name] is here to discuss her progress with her obesity treatment plan along with follow-up of her obesity related diagnoses. Melissa [name] is on keeping a food journal and adhering to recommended goals of 1200-1400 calories and 85+ grams of protein and states she is following her eating plan approximately 0% of the time. Melissa [name] states she is walking for 15 minutes 1 times per week.  Today's visit was #: 2 Starting weight: 186 lbs Starting date: 04/25/2020 Today's weight: 186 lbs Today's date: 05/09/2020 Total lbs lost to date: 0 Total lbs lost since last in-office visit: 0  Interim History: Melissa [name] has still been vomiting quite a bit but went to the hospital and was given a new medication.  She is now on Diclegis, Compazine, Zofran, and Reglan.  She is trying to eat small meals to keep food down.  She says she is not keeping much food down, so she is not journaling.  Her ultrasound is scheduled for 05/16/2020.  Subjective:   1. Vitamin D deficiency [name]'s Vitamin D level was 10.8 on 04/25/2020. She is currently taking no vitamin D supplement. She denies nausea, vomiting or muscle weakness.  She endorses fatigue.  2. Insulin resistance Melissa [name] has a diagnosis of insulin resistance based on her elevated fasting insulin level >5. She continues to work on diet and exercise to decrease her risk of diabetes.  The patient is currently [redacted] weeks pregnant.  Lab Results  Component Value Date   INSULIN 10.5 04/25/2020   Lab Results  Component Value Date   HGBA1C 5.2 04/25/2020   Assessment/Plan:   1. Vitamin D deficiency Low Vitamin D level contributes to fatigue and are associated with obesity, breast, and colon cancer. She agrees to start to take prescription Vitamin D @50 ,000 IU every week and will follow-up for routine testing of Vitamin D, at least 2-3 times per year to avoid over-replacement.  2. Insulin resistance Follow-up labs of 1 hour GTT done at [facility]; labs postpartum.  3.  Class 1 obesity with serious comorbidity and body mass index (BMI) of 31.0 to 31.9 in adult, unspecified obesity type Melissa [name] is currently in the action stage of change. As such, her goal is to continue with weight loss efforts. She has agreed to keeping a food journal and adhering to recommended goals of 1200-1400 calories and 85+ grams of protein.   Exercise goals: No exercise has been prescribed at this time.  Behavioral modification strategies: increasing lean protein intake, increasing vegetables, meal planning and cooking strategies, keeping healthy foods in the home and planning for success.  Melissa [name] has agreed to follow-up with our clinic in 6 weeks. She was informed of the importance of frequent follow-up visits to maximize her success with intensive lifestyle modifications for her multiple health conditions.   Objective:   Blood pressure 103/68, pulse 94, temperature 98.2 F (36.8 C), temperature source Oral, height 5\' 5"  (1.651 m), weight 186 lb (84.4 kg), last menstrual period 02/19/2020, SpO2 100 %, unknown if currently breastfeeding. Body mass index is 30.95 kg/m.  General: Cooperative, alert, well developed, in no acute distress. HEENT: Conjunctivae and lids unremarkable. Cardiovascular: Regular rhythm.  Lungs: Normal work of breathing. Neurologic: No focal deficits.   Lab Results  Component Value Date   CREATININE 0.73 04/13/2020   BUN 6 04/13/2020   NA 136 04/13/2020   K 3.8 04/13/2020   CL 105 04/13/2020   CO2 21 (L) 04/13/2020   Lab Results  Component Value  Date   ALT 12 04/13/2020   AST 19 04/13/2020   ALKPHOS 45 04/13/2020   BILITOT 1.2 04/13/2020   Lab Results  Component Value Date   HGBA1C 5.2 04/25/2020   HGBA1C 5.5 02/24/2020   HGBA1C 5.6 03/05/2019   Lab Results  Component Value Date   INSULIN 10.5 04/25/2020   Lab Results  Component Value Date   TSH 0.734 04/25/2020   Lab Results  Component Value Date   CHOL 131 04/25/2020   HDL 52 04/25/2020     LDLCALC 64 04/25/2020   TRIG 78 04/25/2020   CHOLHDL 2.4 02/24/2020   Lab Results  Component Value Date   WBC 7.7 03/21/2020   HGB 12.0 03/21/2020   HCT 35.9 (L) 03/21/2020   MCV 85.7 03/21/2020   PLT 200 03/21/2020   Attestation Statements:   Reviewed by clinician on day of visit: allergies, medications, problem list, medical history, surgical history, family history, social history, and previous encounter notes.  Time spent on visit including pre-visit chart review and post-visit care and charting was 25 minutes.   I, Water quality scientist, CMA, am acting as Location manager for [employer], DO.  I have reviewed the above documentation for accuracy and completeness, and I agree with the above. - Jinny Blossom, MD

## 2020-05-11 ENCOUNTER — Telehealth: Payer: Self-pay | Admitting: Physician Assistant

## 2020-05-11 NOTE — Telephone Encounter (Signed)
° °  Rosey Bath from [facility] calling requesting a consult note from pt's last visit 04/29/20. She gave fax # [phone]  Please advise

## 2020-05-16 ENCOUNTER — Inpatient Hospital Stay: Payer: BC Managed Care – PPO

## 2020-05-16 ENCOUNTER — Inpatient Hospital Stay (HOSPITAL_COMMUNITY)
Admission: AD | Admit: 2020-05-16 | Discharge: 2020-05-19 | DRG: 832 | Disposition: A | Discharge status: Home / Self Care | Payer: BC Managed Care – PPO | Attending: Obstetrics & Gynecology | Admitting: Obstetrics & Gynecology

## 2020-05-16 ENCOUNTER — Other Ambulatory Visit: Payer: Self-pay

## 2020-05-16 DIAGNOSIS — E669 Obesity, unspecified: Secondary | ICD-10-CM | POA: Diagnosis not present

## 2020-05-16 DIAGNOSIS — Z8673 Personal history of transient ischemic attack (TIA), and cerebral infarction without residual deficits: Secondary | ICD-10-CM | POA: Diagnosis not present

## 2020-05-16 DIAGNOSIS — Z3A14 14 weeks gestation of pregnancy: Secondary | ICD-10-CM

## 2020-05-16 DIAGNOSIS — O211 Hyperemesis gravidarum with metabolic disturbance: Principal | ICD-10-CM | POA: Diagnosis present

## 2020-05-16 DIAGNOSIS — Z3483 Encounter for supervision of other normal pregnancy, third trimester: Secondary | ICD-10-CM | POA: Diagnosis not present

## 2020-05-16 DIAGNOSIS — O99012 Anemia complicating pregnancy, second trimester: Secondary | ICD-10-CM | POA: Diagnosis not present

## 2020-05-16 DIAGNOSIS — Z20822 Contact with and (suspected) exposure to covid-19: Secondary | ICD-10-CM | POA: Diagnosis present

## 2020-05-16 DIAGNOSIS — O99212 Obesity complicating pregnancy, second trimester: Secondary | ICD-10-CM | POA: Diagnosis not present

## 2020-05-16 DIAGNOSIS — O210 Mild hyperemesis gravidarum: Secondary | ICD-10-CM | POA: Diagnosis not present

## 2020-05-16 DIAGNOSIS — O99893 Other specified diseases and conditions complicating puerperium: Secondary | ICD-10-CM | POA: Diagnosis not present

## 2020-05-16 DIAGNOSIS — O3680X9 Pregnancy with inconclusive fetal viability, other fetus: Secondary | ICD-10-CM | POA: Diagnosis not present

## 2020-05-16 DIAGNOSIS — Z79899 Other long term (current) drug therapy: Secondary | ICD-10-CM | POA: Diagnosis not present

## 2020-05-16 DIAGNOSIS — O10012 Pre-existing essential hypertension complicating pregnancy, second trimester: Secondary | ICD-10-CM | POA: Diagnosis not present

## 2020-05-16 DIAGNOSIS — D573 Sickle-cell trait: Secondary | ICD-10-CM | POA: Diagnosis present

## 2020-05-16 LAB — CBC
HCT: 38.3 % (ref 36.0–46.0)
Hemoglobin: 12.8 g/dL (ref 12.0–15.0)
MCH: 29.1 pg (ref 26.0–34.0)
MCHC: 33.4 g/dL (ref 30.0–36.0)
MCV: 87 fL (ref 80.0–100.0)
Platelets: 204 10*3/uL (ref 150–400)
RBC: 4.4 MIL/uL (ref 3.87–5.11)
RDW: 13.4 % (ref 11.5–15.5)
WBC: 7.2 10*3/uL (ref 4.0–10.5)
nRBC: 0 % (ref 0.0–0.2)

## 2020-05-16 LAB — COMPREHENSIVE METABOLIC PANEL
ALT: 13 U/L (ref 0–44)
AST: 14 U/L — ABNORMAL LOW (ref 15–41)
Albumin: 3.3 g/dL — ABNORMAL LOW (ref 3.5–5.0)
Alkaline Phosphatase: 46 U/L (ref 38–126)
Anion gap: 13 (ref 5–15)
BUN: <5 mg/dL — ABNORMAL LOW (ref 6–20)
CO2: 18 mmol/L — ABNORMAL LOW (ref 22–32)
Calcium: 9 mg/dL (ref 8.9–10.3)
Chloride: 103 mmol/L (ref 98–111)
Creatinine, Ser: 0.56 mg/dL (ref 0.44–1.00)
GFR calc Af Amer: >60 mL/min (ref >60)
GFR calc non Af Amer: >60 mL/min (ref >60)
Glucose, Bld: 82 mg/dL (ref 70–99)
Potassium: 3.8 mmol/L (ref 3.5–5.1)
Sodium: 134 mmol/L — ABNORMAL LOW (ref 135–145)
Total Bilirubin: 0.7 mg/dL (ref 0.3–1.2)
Total Protein: 6.9 g/dL (ref 6.5–8.1)

## 2020-05-16 LAB — URINALYSIS, ROUTINE W REFLEX MICROSCOPIC
Bilirubin Urine: NEGATIVE
Glucose, UA: 150 mg/dL — AB
Hgb urine dipstick: NEGATIVE
Ketones, ur: NEGATIVE mg/dL
Leukocytes,Ua: NEGATIVE
Nitrite: NEGATIVE
Protein, ur: NEGATIVE mg/dL
Specific Gravity, Urine: 1.004 — ABNORMAL LOW (ref 1.005–1.030)
pH: 6 (ref 5.0–8.0)

## 2020-05-16 LAB — TYPE AND SCREEN
ABO/RH(D): O POS
Antibody Screen: NEGATIVE

## 2020-05-16 LAB — SARS CORONAVIRUS 2 BY RT PCR (HOSPITAL ORDER, PERFORMED IN ~~LOC~~ HOSPITAL LAB): SARS Coronavirus 2: NEGATIVE

## 2020-05-16 LAB — ABO/RH: ABO/RH(D): O POS

## 2020-05-16 MED ORDER — DOCUSATE SODIUM 100 MG PO CAPS
100.0000 mg | ORAL_CAPSULE | Freq: Every day | ORAL | Status: DC
  Administered 2020-05-16 – 2020-05-19 (×3): 100 mg via ORAL
  Filled 2020-05-16 (×3): qty 1

## 2020-05-16 MED ORDER — DEXTROSE 5 % IN LACTATED RINGERS IV BOLUS
1000.0000 mL | Freq: Once | INTRAVENOUS | Status: AC
  Administered 2020-05-16: 1000 mL via INTRAVENOUS

## 2020-05-16 MED ORDER — POLYETHYLENE GLYCOL 3350 17 G PO PACK
17.0000 g | PACK | Freq: Once | ORAL | Status: AC
  Administered 2020-05-16: 17 g via ORAL
  Filled 2020-05-16: qty 1

## 2020-05-16 MED ORDER — ACETAMINOPHEN 325 MG PO TABS
650.0000 mg | ORAL_TABLET | ORAL | Status: DC | PRN
  Administered 2020-05-16 – 2020-05-19 (×2): 650 mg via ORAL
  Filled 2020-05-16 (×2): qty 2

## 2020-05-16 MED ORDER — SCOPOLAMINE 1 MG/3DAYS TD PT72
1.0000 | MEDICATED_PATCH | TRANSDERMAL | Status: DC
  Administered 2020-05-16: 1.5 mg via TRANSDERMAL
  Filled 2020-05-16: qty 1

## 2020-05-16 MED ORDER — PREDNISONE 20 MG PO TABS: 40.0000 mg | ORAL_TABLET | Freq: Every day | ORAL | Status: DC

## 2020-05-16 MED ORDER — PREDNISONE 20 MG PO TABS: 20.0000 mg | ORAL_TABLET | Freq: Every day | ORAL | Status: DC

## 2020-05-16 MED ORDER — METHYLPREDNISOLONE SODIUM SUCC 40 MG IJ SOLR
16.0000 mg | Freq: Three times a day (TID) | INTRAMUSCULAR | Status: AC
  Administered 2020-05-16 – 2020-05-19 (×9): 16 mg via INTRAVENOUS
  Filled 2020-05-16 (×9): qty 0.4

## 2020-05-16 MED ORDER — PREDNISONE 5 MG PO TABS: 5.0000 mg | ORAL_TABLET | Freq: Every day | ORAL | Status: DC

## 2020-05-16 MED ORDER — CALCIUM CARBONATE ANTACID 500 MG PO CHEW: 2.0000 | CHEWABLE_TABLET | ORAL | Status: DC | PRN

## 2020-05-16 MED ORDER — ONDANSETRON HCL 4 MG/2ML IJ SOLN
4.0000 mg | Freq: Four times a day (QID) | INTRAMUSCULAR | Status: DC | PRN
  Administered 2020-05-16 – 2020-05-18 (×4): 4 mg via INTRAVENOUS
  Filled 2020-05-16 (×4): qty 2

## 2020-05-16 MED ORDER — DEXTROSE IN LACTATED RINGERS 5 % IV SOLN: INTRAVENOUS | Status: DC

## 2020-05-16 MED ORDER — PREDNISONE 20 MG PO TABS: 10.0000 mg | ORAL_TABLET | Freq: Every day | ORAL | Status: DC

## 2020-05-16 MED ORDER — PANTOPRAZOLE SODIUM 40 MG IV SOLR
40.0000 mg | Freq: Every day | INTRAVENOUS | Status: DC
  Administered 2020-05-16 – 2020-05-19 (×4): 40 mg via INTRAVENOUS
  Filled 2020-05-16 (×4): qty 40
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
## 2020-05-17 ENCOUNTER — Encounter (HOSPITAL_COMMUNITY): Payer: Self-pay | Admitting: Obstetrics & Gynecology

## 2020-05-17 DIAGNOSIS — O210 Mild hyperemesis gravidarum: Secondary | ICD-10-CM | POA: Diagnosis not present

## 2020-05-17 DIAGNOSIS — Z3A14 14 weeks gestation of pregnancy: Secondary | ICD-10-CM | POA: Diagnosis not present
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
## 2020-05-18 DIAGNOSIS — Z3A14 14 weeks gestation of pregnancy: Secondary | ICD-10-CM | POA: Diagnosis not present

## 2020-05-18 DIAGNOSIS — O210 Mild hyperemesis gravidarum: Secondary | ICD-10-CM | POA: Diagnosis not present

## 2020-05-18 MED ORDER — FLEET ENEMA 7-19 GM/118ML RE ENEM
1.0000 | ENEMA | Freq: Once | RECTAL | Status: AC
  Administered 2020-05-18: 1 via RECTAL

## 2020-05-18 NOTE — Progress Notes (Addendum)
Hospital Day #3 [id number] [redacted]w[redacted]d Admitted to IV therapy for hyperemesis gravidarum refractory to home management on oral antiemetics.  S: Vomited 60 min after breakfast this AM. Currently trialing sherbet and tolerating w/o nausea. Day #2 of IV steroids, tolerating well, will begin PO on 05/20/20.  O:  Blood pressure 111/67, pulse (!) 58, temperature 98.4 F (36.9 C), temperature source Oral, resp. rate 18, height 5\' 5"  (1.651 m), weight 91 kg, last menstrual period 02/19/2020, SpO2 100 %  FHT's 141  A/P: 02/21/2020 [redacted]w[redacted]d Hyperemesis gravidarum  Doppler FHT's Q shift     -normal Continue steroid taper:     -IV methylprednisolone 16 mg every 8 hours for 3 days (will complete on 05/19/20) Followed by prednisone:  40 mg p.o. x2 day (will begin 05/20/20) 20 mg p.o. x5 days 10 mg p.o. x5 days 5 mg p.o. x7 days  Scopolamine patch applied to the back of the ear once every 3 days IV Protonix 40 mg daily while receiving IV therapy Daily weights  05/22/20, MSN, CNM 05/18/2020 2:03 PM

## 2020-05-19 DIAGNOSIS — Z3A14 14 weeks gestation of pregnancy: Secondary | ICD-10-CM | POA: Diagnosis not present

## 2020-05-19 DIAGNOSIS — O210 Mild hyperemesis gravidarum: Secondary | ICD-10-CM | POA: Diagnosis not present

## 2020-05-19 MED ORDER — SCOPOLAMINE 1 MG/3DAYS TD PT72: 1.0000 | MEDICATED_PATCH | TRANSDERMAL | 12 refills | Status: DC

## 2020-05-19 MED ORDER — PANTOPRAZOLE SODIUM 20 MG PO TBEC
20.0000 mg | DELAYED_RELEASE_TABLET | Freq: Every day | ORAL | 1 refills | Status: DC
Start: 2020-05-19 — End: 2020-07-27

## 2020-05-19 MED ORDER — DOCUSATE SODIUM 100 MG PO CAPS: 100.0000 mg | ORAL_CAPSULE | Freq: Every day | ORAL | 0 refills | Status: DC

## 2020-05-19 MED ORDER — PREDNISONE 20 MG PO TABS: 20.0000 mg | ORAL_TABLET | Freq: Every day | ORAL | 0 refills | Status: AC

## 2020-05-19 MED ORDER — PREDNISONE 10 MG PO TABS: 40.0000 mg | ORAL_TABLET | Freq: Every day | ORAL | 0 refills | Status: AC

## 2020-05-19 MED ORDER — PANTOPRAZOLE SODIUM 40 MG IV SOLR: 40.0000 mg | Freq: Every day | INTRAVENOUS | 1 refills | Status: DC

## 2020-05-19 MED ORDER — ACETAMINOPHEN 325 MG PO TABS: 650.0000 mg | ORAL_TABLET | ORAL | 0 refills | Status: DC | PRN

## 2020-05-19 MED ORDER — PREDNISONE 5 MG PO TABS: 5.0000 mg | ORAL_TABLET | Freq: Every day | ORAL | 0 refills | Status: AC

## 2020-05-19 MED ORDER — PREDNISONE 20 MG PO TABS: 10.0000 mg | ORAL_TABLET | Freq: Every day | ORAL | 0 refills | Status: AC

## 2020-05-19 NOTE — Progress Notes (Signed)
Pt's IV site infiltrated and removed by RN.  Pt ate breakfast and is wanting to eat lunch now.  She says she has felt nauseated all morning, but has not vomited.    Phoned AYetta Barre, CNM with above information and asked about trying oral hydration challenge at this time.  CNM agreed.  See order to d/c IVF.

## 2020-05-19 NOTE — Plan of Care (Signed)
  Problem: Education: Goal: Knowledge of disease or condition will improve Outcome: Completed/Met Goal: Knowledge of the prescribed therapeutic regimen will improve Outcome: Completed/Met   

## 2020-05-19 NOTE — Progress Notes (Signed)
Patient discharged home with family. Discharge instructions reviewed with patient and family together. No equipment for home ordered at discharge. Work Physicist, medical from [employer] given to patient. Patient to the car via wheelchair with Terrilyn Saver, NT.

## 2020-05-19 NOTE — Discharge Summary (Signed)
Physician Discharge Summary  Patient ID: Melissa [name] MRN: [id number] DOB/AGE: May 19, 1986 33 y.o.  Admit date: 05/16/2020 Discharge date: 05/19/2020  Admission Diagnoses:  Discharge Diagnoses:  Active Problems:   Hyperemesis gravidarum with dehydration   Discharged Condition: good  Hospital Course: Pt came in for IV fluids and steroids to help with hyperemesis.  She is now able to tolerate PO and will be sent home on a steroid taper and antiemetics  Consults: MFM  Significant Diagnostic Studies: labs: reviewed  Treatments: IV hydration and steroids: prednisone  Discharge Exam: Blood pressure 109/66, pulse 66, temperature 98.3 F (36.8 C), temperature source Oral, resp. rate 18, height 5\' 5"  (1.651 m), weight 89.4 kg, last menstrual period 02/19/2020, SpO2 100 %, unknown if currently breastfeeding. General appearance: alert and cooperative Resp: clear to auscultation bilaterally Cardio: regular rate and rhythm Extremities: Homans sign is negative, no sign of DVT  Disposition: Discharge disposition: 01-Home or Self Care         Follow-up Information    [facility]. Schedule an appointment as soon as possible for a visit.   Specialty: Obstetrics and Gynecology Contact information: [address] [place] [phone]          Signed: [phone] 05/19/2020, 3:33 PM

## 2020-05-19 NOTE — Discharge Instructions (Signed)
Hyperemesis Gravidarum Hyperemesis gravidarum is a severe form of nausea and vomiting that happens during pregnancy. Hyperemesis is worse than morning sickness. It may cause you to have nausea or vomiting all day for many days. It may keep you from eating and drinking enough food and liquids, which can lead to dehydration, malnutrition, and weight loss. Hyperemesis usually occurs during the first half (the first 20 weeks) of pregnancy. It often goes away once a woman is in her second half of pregnancy. However, sometimes hyperemesis continues through an entire pregnancy. What are the causes? The cause of this condition is not known. It may be related to changes in chemicals (hormones) in the body during pregnancy, such as the high level of pregnancy hormone (human chorionic gonadotropin) or the increase in the female sex hormone (estrogen). What are the signs or symptoms? Symptoms of this condition include:  Nausea that does not go away.  Vomiting that does not allow you to keep any food down.  Weight loss.  Body fluid loss (dehydration).  Having no desire to eat, or not liking food that you have previously enjoyed. How is this diagnosed? This condition may be diagnosed based on:  A physical exam.  Your medical history.  Your symptoms.  Blood tests.  Urine tests. How is this treated? This condition is managed by controlling symptoms. This may include:  Following an eating plan. This can help lessen nausea and vomiting.  Taking prescription medicines. An eating plan and medicines are often used together to help control symptoms. If medicines do not help relieve nausea and vomiting, you may need to receive fluids through an IV at the hospital. Follow these instructions at home: Eating and drinking   Avoid the following: ? Drinking fluids with meals. Try not to drink anything during the 30 minutes before and after your meals. ? Drinking more than 1 cup of fluid at a  time. ? Eating foods that trigger your symptoms. These may include spicy foods, coffee, high-fat foods, very sweet foods, and acidic foods. ? Skipping meals. Nausea can be more intense on an empty stomach. If you cannot tolerate food, do not force it. Try sucking on ice chips or other frozen items and make up for missed calories later. ? Lying down within 2 hours after eating. ? Being exposed to environmental triggers. These may include food smells, smoky rooms, closed spaces, rooms with strong smells, warm or humid places, overly loud and noisy rooms, and rooms with motion or flickering lights. Try eating meals in a well-ventilated area that is free of strong smells. ? Quick and sudden changes in your movement. ? Taking iron pills and multivitamins that contain iron. If you take prescription iron pills, do not stop taking them unless your health care provider approves. ? Preparing food. The smell of food can spoil your appetite or trigger nausea.  To help relieve your symptoms: ? Listen to your body. Everyone is different and has different preferences. Find what works best for you. ? Eat and drink slowly. ? Eat 5-6 small meals daily instead of 3 large meals. Eating small meals and snacks can help you avoid an empty stomach. ? In the morning, before getting out of bed, eat a couple of crackers to avoid moving around on an empty stomach. ? Try eating starchy foods as these are usually tolerated well. Examples include cereal, toast, bread, potatoes, pasta, rice, and pretzels. ? Include at least 1 serving of protein with your meals and snacks. Protein options include   lean meats, poultry, seafood, beans, nuts, nut butters, eggs, cheese, and yogurt. ? Try eating a protein-rich snack before bed. Examples of a protein-rick snack include cheese and crackers or a peanut butter sandwich made with 1 slice of whole-wheat bread and 1 tsp (5 g) of peanut butter. ? Eat or suck on things that have ginger in them.  It may help relieve nausea. Add  tsp ground ginger to hot tea or choose ginger tea. ? Try drinking 100% fruit juice or an electrolyte drink. An electrolyte drink contains sodium, potassium, and chloride. ? Drink fluids that are cold, clear, and carbonated or sour. Examples include lemonade, ginger ale, lemon-lime soda, ice water, and sparkling water. ? Brush your teeth or use a mouth rinse after meals. ? Talk with your health care provider about starting a supplement of vitamin B6. General instructions  Take over-the-counter and prescription medicines only as told by your health care provider.  Follow instructions from your health care provider about eating or drinking restrictions.  Continue to take your prenatal vitamins as told by your health care provider. If you are having trouble taking your prenatal vitamins, talk with your health care provider about different options.  Keep all follow-up and pre-birth (prenatal) visits as told by your health care provider. This is important. Contact a health care provider if:  You have pain in your abdomen.  You have a severe headache.  You have vision problems.  You are losing weight.  You feel weak or dizzy. Get help right away if:  You cannot drink fluids without vomiting.  You vomit blood.  You have constant nausea and vomiting.  You are very weak.  You faint.  You have a fever and your symptoms suddenly get worse. Summary  Hyperemesis gravidarum is a severe form of nausea and vomiting that happens during pregnancy.  Making some changes to your eating habits may help relieve nausea and vomiting.  This condition may be managed with medicine.  If medicines do not help relieve nausea and vomiting, you may need to receive fluids through an IV at the hospital. This information is not intended to replace advice given to you by your health care provider. Make sure you discuss any questions you have with your health care  provider. Document Revised: 01/06/2018 Document Reviewed: 08/15/2016 Elsevier Patient Education  2020 Elsevier Inc.  

## 2020-05-24 ENCOUNTER — Inpatient Hospital Stay (HOSPITAL_COMMUNITY)
Admission: AD | Admit: 2020-05-24 | Discharge: 2020-05-24 | Disposition: A | Discharge status: Home / Self Care | Payer: BC Managed Care – PPO | Attending: Obstetrics and Gynecology | Admitting: Obstetrics and Gynecology

## 2020-05-24 ENCOUNTER — Encounter (HOSPITAL_COMMUNITY): Payer: Self-pay | Admitting: Obstetrics and Gynecology

## 2020-05-24 ENCOUNTER — Other Ambulatory Visit: Payer: Self-pay

## 2020-05-24 DIAGNOSIS — J45909 Unspecified asthma, uncomplicated: Secondary | ICD-10-CM | POA: Insufficient documentation

## 2020-05-24 DIAGNOSIS — D573 Sickle-cell trait: Secondary | ICD-10-CM | POA: Insufficient documentation

## 2020-05-24 DIAGNOSIS — O10912 Unspecified pre-existing hypertension complicating pregnancy, second trimester: Secondary | ICD-10-CM | POA: Insufficient documentation

## 2020-05-24 DIAGNOSIS — Z79899 Other long term (current) drug therapy: Secondary | ICD-10-CM | POA: Diagnosis not present

## 2020-05-24 DIAGNOSIS — K219 Gastro-esophageal reflux disease without esophagitis: Secondary | ICD-10-CM | POA: Insufficient documentation

## 2020-05-24 DIAGNOSIS — Z3A15 15 weeks gestation of pregnancy: Secondary | ICD-10-CM | POA: Insufficient documentation

## 2020-05-24 DIAGNOSIS — I252 Old myocardial infarction: Secondary | ICD-10-CM | POA: Insufficient documentation

## 2020-05-24 DIAGNOSIS — Z8673 Personal history of transient ischemic attack (TIA), and cerebral infarction without residual deficits: Secondary | ICD-10-CM | POA: Insufficient documentation

## 2020-05-24 DIAGNOSIS — Z7951 Long term (current) use of inhaled steroids: Secondary | ICD-10-CM | POA: Diagnosis not present

## 2020-05-24 DIAGNOSIS — O99612 Diseases of the digestive system complicating pregnancy, second trimester: Secondary | ICD-10-CM | POA: Insufficient documentation

## 2020-05-24 DIAGNOSIS — O219 Vomiting of pregnancy, unspecified: Secondary | ICD-10-CM | POA: Insufficient documentation

## 2020-05-24 DIAGNOSIS — O99512 Diseases of the respiratory system complicating pregnancy, second trimester: Secondary | ICD-10-CM | POA: Diagnosis not present

## 2020-05-24 DIAGNOSIS — O218 Other vomiting complicating pregnancy: Secondary | ICD-10-CM | POA: Diagnosis not present

## 2020-05-24 LAB — COMPREHENSIVE METABOLIC PANEL
ALT: 21 U/L (ref 0–44)
AST: 19 U/L (ref 15–41)
Albumin: 3.4 g/dL — ABNORMAL LOW (ref 3.5–5.0)
Alkaline Phosphatase: 46 U/L (ref 38–126)
Anion gap: 10 (ref 5–15)
BUN: 5 mg/dL — ABNORMAL LOW (ref 6–20)
CO2: 23 mmol/L (ref 22–32)
Calcium: 9.4 mg/dL (ref 8.9–10.3)
Chloride: 105 mmol/L (ref 98–111)
Creatinine, Ser: 0.57 mg/dL (ref 0.44–1.00)
GFR calc Af Amer: >60 mL/min (ref >60)
GFR calc non Af Amer: >60 mL/min (ref >60)
Glucose, Bld: 100 mg/dL — ABNORMAL HIGH (ref 70–99)
Potassium: 4.2 mmol/L (ref 3.5–5.1)
Sodium: 138 mmol/L (ref 135–145)
Total Bilirubin: 0.4 mg/dL (ref 0.3–1.2)
Total Protein: 7.4 g/dL (ref 6.5–8.1)

## 2020-05-24 LAB — CBC
HCT: 39.6 % (ref 36.0–46.0)
Hemoglobin: 13.2 g/dL (ref 12.0–15.0)
MCH: 29.1 pg (ref 26.0–34.0)
MCHC: 33.3 g/dL (ref 30.0–36.0)
MCV: 87.4 fL (ref 80.0–100.0)
Platelets: 238 10*3/uL (ref 150–400)
RBC: 4.53 MIL/uL (ref 3.87–5.11)
RDW: 13.5 % (ref 11.5–15.5)
WBC: 9.9 10*3/uL (ref 4.0–10.5)
nRBC: 0 % (ref 0.0–0.2)

## 2020-05-24 MED ORDER — PROMETHAZINE HCL 25 MG/ML IJ SOLN
25.0000 mg | Freq: Once | INTRAMUSCULAR | Status: AC
  Administered 2020-05-24: 25 mg via INTRAVENOUS
  Filled 2020-05-24: qty 1

## 2020-05-24 MED ORDER — LACTATED RINGERS IV BOLUS
1000.0000 mL | Freq: Once | INTRAVENOUS | Status: AC
  Administered 2020-05-24: 1000 mL via INTRAVENOUS

## 2020-05-24 NOTE — MAU Provider Note (Signed)
History     Arrival date and time: 05/24/20 1726  Chief Complaint  Patient presents with  . Emesis  . Headache   Emesis  Pertinent negatives include no headaches.  Headache  Associated symptoms include vomiting.     Melissa [name] is a 34 yo G4P3003 at 15 weeks 2 days EGA who is presenting to MAU for nausea and vomiting. She receives her care at [facility].  She has been nauseated since yesterday and vomited several times, when she found out she was pregnant. She was given reglan and diclegis, which helped until she couldn't keep the medicine down. She has not been able to keep water down since yesterday, was see in [facility] and sent here for a bag of IV fluids, Pt was d/c from hospital for vomiting 5 days ago and was given Protonix, prednisone, phenergan and Zofran form office, pt unable to take due to nausea. Has not been able to keep food down 1 day. Does have some associated abdominal pain, but only when she is vomiting. Denies diarrhea, constipation, hematochezia, melena, or vaginal discharge. She does not have dysuria, UA was done in the office, no ketone noted.   OB History    Gravida  4   Para  3   Term  3   Preterm      AB      Living  3     SAB      TAB      Ectopic      Multiple  0   Live Births  3           Past Medical History:  Diagnosis Date  . Abnormal Pap smear 2010   Repeat pap done;Hysteroscopy;Last pap 2012;was normal  . Anemia 2010   Iron supplements PP  . Asthma    Has a nebulizer;triggered by stress  . Chest pain   . Colon polyps   . Headache(784.0)   . Heart attack [facility])    MD thinks may have had a heart attack;Cardiologist @ [facility]  . History of CVA (cerebrovascular accident) 04/09/2013   Pt reports presumptive "stroke" when she was 34y.o.; followed by [facility] neurology for "chronic nerve pain" and has been Rx'd Hydrocodone and Mobic  . Hypertension   . Infection    BV;may get frequently  . Multiple food allergies   . Nerve pain    Chronic;d/t  possible stroke and MI  . Placenta previa antepartum 2010   Placenta moved 3 days before baby was born  . Pre-diabetes   . Shortness of breath   . Sickle cell trait ([place])   . Stroke [facility]) 2013   MD thought may have had a stroke and heart attack;has neurologist @ [facility]  . Vitamin D deficiency     Past Surgical History:  Procedure Laterality Date  . HYSTEROSCOPY  2010  . LEFT HEART CATH AND CORONARY ANGIOGRAPHY N/A 02/18/2019   Procedure: LEFT HEART CATH AND CORONARY ANGIOGRAPHY;  Surgeon: Leonie Man, MD;  Location: [place] [facility];  Service: Cardiovascular;  Laterality: N/A;    Family History  Problem Relation Age of Onset  . Sickle cell trait Father   . Hypertension Mother   . Anemia Mother   . Asthma Mother   . Diabetes Mother   . Cervical cancer Paternal Aunt   . Cervical cancer Paternal Grandmother   . Diabetes Paternal Grandmother   . Diabetes Paternal Grandfather   . Diabetes Paternal Aunt   . Asthma Brother   .  Asthma Daughter   . Asthma Son     Social History   Tobacco Use  . Smoking status: Never Smoker  . Smokeless tobacco: Never Used  Substance Use Topics  . Alcohol use: Not Currently    Alcohol/week: 0.0 standard drinks    Comment: last use 03/16/2020  . Drug use: No    Allergies:  Allergies  Allergen Reactions  . Ginger Anaphylaxis    Medications Prior to Admission  Medication Sig Dispense Refill Last Dose  . acetaminophen (TYLENOL) 325 MG tablet Take 2 tablets (650 mg total) by mouth every 4 (four) hours as needed (for pain scale < 4  OR  temperature  >/=  100.5 F). 30 tablet 0 05/24/2020 at Unknown time  . docusate sodium (COLACE) 100 MG capsule Take 1 capsule (100 mg total) by mouth daily. 10 capsule 0 05/23/2020 at Unknown time  . Doxylamine-Pyridoxine (DICLEGIS) 10-10 MG TBEC Take 1 tablet by mouth 2 (two) times daily as needed (nausea). 60 tablet 0 05/23/2020 at Unknown time  . metoCLOPramide (REGLAN) 10 MG tablet Take 1  tablet (10 mg total) by mouth every 6 (six) hours as needed for nausea (or headache). 30 tablet 0 05/24/2020 at Unknown time  . mometasone-formoterol (DULERA) 200-5 MCG/ACT AERO Inhale 2 puffs into the lungs 2 (two) times daily.   Past Week at Unknown time  . ondansetron (ZOFRAN ODT) 4 MG disintegrating tablet Take 1 tablet (4 mg total) by mouth every 8 (eight) hours as needed for nausea or vomiting. 10 tablet 0 05/23/2020 at Unknown time  . pantoprazole (PROTONIX) 20 MG tablet Take 1 tablet (20 mg total) by mouth daily. 30 tablet 1 05/24/2020 at Unknown time  . predniSONE (DELTASONE) 20 MG tablet Take 1 tablet (20 mg total) by mouth daily with breakfast for 5 days. 5 tablet 0 05/24/2020 at Unknown time  . Prenatal Vit-Fe Fumarate-FA (MULTIVITAMIN-PRENATAL) 27-0.8 MG TABS tablet Take 1 tablet by mouth daily at 12 noon. 30 tablet 0 05/23/2020 at Unknown time  . promethazine (PHENERGAN) 25 MG tablet Take 25 mg by mouth every 6 (six) hours as needed for nausea or vomiting.   05/23/2020 at Unknown time  . scopolamine (TRANSDERM-SCOP) 1 MG/3DAYS Place 1 patch (1.5 mg total) onto the skin every 3 (three) days. 10 patch 12 05/24/2020 at Unknown time  . albuterol (PROVENTIL HFA;VENTOLIN HFA) 108 (90 BASE) MCG/ACT inhaler Inhale 2 puffs into the lungs every 4 (four) hours as needed for wheezing or shortness of breath. 1 Inhaler 12 More than a month at Unknown time  . predniSONE (DELTASONE) 20 MG tablet Take 0.5 tablets (10 mg total) by mouth daily with breakfast for 5 doses. 2.5 tablet 0   . [START ON 06/04/2020] predniSONE (DELTASONE) 5 MG tablet Take 1 tablet (5 mg total) by mouth daily with breakfast for 7 days. 7 tablet 0     Review of Systems  Gastrointestinal: Positive for vomiting.  Neurological: Negative for headaches.  All other systems reviewed and are negative.  Physical Exam   Blood pressure 99/67, pulse 93, temperature 99.1 F (37.3 C), temperature source Oral, resp. rate 16, weight 86.6 kg, last  menstrual period 02/19/2020, SpO2 100 %, unknown if currently breastfeeding.  Physical Exam  Nursing note and vitals reviewed. Constitutional: She is oriented to person, place, and time. She appears well-developed and well-nourished.  HENT:  Head: Normocephalic and atraumatic.  Eyes: Pupils are equal, round, and reactive to light. Conjunctivae and EOM are normal.  Cardiovascular: Normal rate,  regular rhythm, normal heart sounds and intact distal pulses.  Respiratory: Effort normal and breath sounds normal.  GI: Soft. Bowel sounds are normal. She exhibits no distension and no mass. There is no abdominal tenderness. There is no rebound and no guarding.  Musculoskeletal:        General: Normal range of motion.     Cervical back: Normal range of motion and neck supple.  Neurological: She is alert and oriented to person, place, and time. She has normal reflexes.  Skin: Skin is warm and dry.  Psychiatric: She has a normal mood and affect. Her behavior is normal. Judgment and thought content normal.    MAU Course  Procedures  MDM -IVF bolus with IV promethazine 25mg  IV -PO challenge tolerated well with apple juice and crackers.  -CMP and CBC unremarkable, no suspicion for dehydration.  -Dr consulted -Pt stable educated on eating protein every two hours and need for taking her medication.    Assessment and Plan  34 yo G4P3003 at 15 weeks 2 days EGA who is presenting to MAU for nausea and vomiting, take home nausea medication daily.   Women with mild symptoms may benefit from acupuncture, acupressure, or hypnosis. Women with heartburn/acid reflux may benefit from acid-reducing medications as adjunctive therapy anytime during the course of illness. Antacids containing aluminum or calcium are preferred. Preferred H2 blockers are ranitidine or cimetidine. There is less experience using proton pump inhibitors (eg, lansoprazole or esomeprazole).  Diet: Eat small amounts of food every one to  two hours to avoid an empty or full stomach. It can be helpful to eliminate spicy, odorous, high-fat, acidic, and very sweet foods, and substitute protein-dominant, salty, low-fat, bland, and/or dry foods. Fluids should be consumed at least 30 minutes before or after solid food to minimize the effect of a full stomach. Fluids are better tolerated if cold, clear, and carbonated or sour. Avoid lying down after eating. We usually begin with a diet consisting of bananas, rice, applesauce, and toast (BRAT diet) and then advance as tolerated to usual diet suggested for women with nausea and vomiting of pregnancy.  Avoid triggers: Examples of some triggers include stuffy rooms, odors, heat, humidity, noise, visual or physical motion, and gastric irritants (eg, coffee, iron supplements).  Start: Doxylamine succinate 10 mg and pyridoxine 10 mg may be given separately or as a combination pill. We begin with 20 mg of each drug at bedtime. If ineffective, we give an additional 10 mg of each drug in the morning and in the afternoon.  May Require: We generally treat refractory cases with a short course of glucocorticoids but may begin with chlorpromazine in selected patients, such as those in whom the side effects of glucocorticoids may be more serious.  F/U next ROB or sooner if needed.  Return if severe vomiting.      34, Dale Annetta [place] 05/24/2020, 8:43 PM

## 2020-05-24 NOTE — Discharge Instructions (Signed)
Hyperemesis Gravidarum Hyperemesis gravidarum is a severe form of nausea and vomiting that happens during pregnancy. Hyperemesis is worse than morning sickness. It may cause you to have nausea or vomiting all day for many days. It may keep you from eating and drinking enough food and liquids, which can lead to dehydration, malnutrition, and weight loss. Hyperemesis usually occurs during the first half (the first 20 weeks) of pregnancy. It often goes away once a woman is in her second half of pregnancy. However, sometimes hyperemesis continues through an entire pregnancy. What are the causes? The cause of this condition is not known. It may be related to changes in chemicals (hormones) in the body during pregnancy, such as the high level of pregnancy hormone (human chorionic gonadotropin) or the increase in the female sex hormone (estrogen). What are the signs or symptoms? Symptoms of this condition include:  Nausea that does not go away.  Vomiting that does not allow you to keep any food down.  Weight loss.  Body fluid loss (dehydration).  Having no desire to eat, or not liking food that you have previously enjoyed. How is this diagnosed? This condition may be diagnosed based on:  A physical exam.  Your medical history.  Your symptoms.  Blood tests.  Urine tests. How is this treated? This condition is managed by controlling symptoms. This may include:  Following an eating plan. This can help lessen nausea and vomiting.  Taking prescription medicines. An eating plan and medicines are often used together to help control symptoms. If medicines do not help relieve nausea and vomiting, you may need to receive fluids through an IV at the hospital. Follow these instructions at home: Eating and drinking   Avoid the following: ? Drinking fluids with meals. Try not to drink anything during the 30 minutes before and after your meals. ? Drinking more than 1 cup of fluid at a  time. ? Eating foods that trigger your symptoms. These may include spicy foods, coffee, high-fat foods, very sweet foods, and acidic foods. ? Skipping meals. Nausea can be more intense on an empty stomach. If you cannot tolerate food, do not force it. Try sucking on ice chips or other frozen items and make up for missed calories later. ? Lying down within 2 hours after eating. ? Being exposed to environmental triggers. These may include food smells, smoky rooms, closed spaces, rooms with strong smells, warm or humid places, overly loud and noisy rooms, and rooms with motion or flickering lights. Try eating meals in a well-ventilated area that is free of strong smells. ? Quick and sudden changes in your movement. ? Taking iron pills and multivitamins that contain iron. If you take prescription iron pills, do not stop taking them unless your health care provider approves. ? Preparing food. The smell of food can spoil your appetite or trigger nausea.  To help relieve your symptoms: ? Listen to your body. Everyone is different and has different preferences. Find what works best for you. ? Eat and drink slowly. ? Eat 5-6 small meals daily instead of 3 large meals. Eating small meals and snacks can help you avoid an empty stomach. ? In the morning, before getting out of bed, eat a couple of crackers to avoid moving around on an empty stomach. ? Try eating starchy foods as these are usually tolerated well. Examples include cereal, toast, bread, potatoes, pasta, rice, and pretzels. ? Include at least 1 serving of protein with your meals and snacks. Protein options include   lean meats, poultry, seafood, beans, nuts, nut butters, eggs, cheese, and yogurt. ? Try eating a protein-rich snack before bed. Examples of a protein-rick snack include cheese and crackers or a peanut butter sandwich made with 1 slice of whole-wheat bread and 1 tsp (5 g) of peanut butter. ? Eat or suck on things that have ginger in them.  It may help relieve nausea. Add  tsp ground ginger to hot tea or choose ginger tea. ? Try drinking 100% fruit juice or an electrolyte drink. An electrolyte drink contains sodium, potassium, and chloride. ? Drink fluids that are cold, clear, and carbonated or sour. Examples include lemonade, ginger ale, lemon-lime soda, ice water, and sparkling water. ? Brush your teeth or use a mouth rinse after meals. ? Talk with your health care provider about starting a supplement of vitamin B6. General instructions  Take over-the-counter and prescription medicines only as told by your health care provider.  Follow instructions from your health care provider about eating or drinking restrictions.  Continue to take your prenatal vitamins as told by your health care provider. If you are having trouble taking your prenatal vitamins, talk with your health care provider about different options.  Keep all follow-up and pre-birth (prenatal) visits as told by your health care provider. This is important. Contact a health care provider if:  You have pain in your abdomen.  You have a severe headache.  You have vision problems.  You are losing weight.  You feel weak or dizzy. Get help right away if:  You cannot drink fluids without vomiting.  You vomit blood.  You have constant nausea and vomiting.  You are very weak.  You faint.  You have a fever and your symptoms suddenly get worse. Summary  Hyperemesis gravidarum is a severe form of nausea and vomiting that happens during pregnancy.  Making some changes to your eating habits may help relieve nausea and vomiting.  This condition may be managed with medicine.  If medicines do not help relieve nausea and vomiting, you may need to receive fluids through an IV at the hospital. This information is not intended to replace advice given to you by your health care provider. Make sure you discuss any questions you have with your health care  provider. Document Revised: 01/06/2018 Document Reviewed: 08/15/2016 Elsevier Patient Education  2020 Elsevier Inc.  

## 2020-05-24 NOTE — MAU Note (Signed)
Has hyperemesis. Only been home a couple days.  Was at office today for f/u and she threw up all over the place, so they sent her in.  Has a HA that won't go away

## 2020-06-06 ENCOUNTER — Encounter (INDEPENDENT_AMBULATORY_CARE_PROVIDER_SITE_OTHER): Payer: Self-pay | Admitting: Family Medicine

## 2020-06-07 ENCOUNTER — Ambulatory Visit (INDEPENDENT_AMBULATORY_CARE_PROVIDER_SITE_OTHER): Payer: BC Managed Care – PPO | Admitting: Family Medicine

## 2020-06-16 ENCOUNTER — Inpatient Hospital Stay (HOSPITAL_BASED_OUTPATIENT_CLINIC_OR_DEPARTMENT_OTHER): Payer: BC Managed Care – PPO

## 2020-06-16 ENCOUNTER — Inpatient Hospital Stay (HOSPITAL_COMMUNITY)
Admission: AD | Admit: 2020-06-16 | Discharge: 2020-06-16 | Disposition: A | Discharge status: Home / Self Care | Payer: BC Managed Care – PPO | Attending: Obstetrics and Gynecology | Admitting: Obstetrics and Gynecology

## 2020-06-16 ENCOUNTER — Encounter (HOSPITAL_COMMUNITY): Payer: Self-pay | Admitting: Obstetrics and Gynecology

## 2020-06-16 ENCOUNTER — Other Ambulatory Visit: Payer: Self-pay

## 2020-06-16 DIAGNOSIS — R1030 Lower abdominal pain, unspecified: Secondary | ICD-10-CM | POA: Diagnosis not present

## 2020-06-16 DIAGNOSIS — O99891 Other specified diseases and conditions complicating pregnancy: Secondary | ICD-10-CM

## 2020-06-16 DIAGNOSIS — Z79899 Other long term (current) drug therapy: Secondary | ICD-10-CM | POA: Diagnosis not present

## 2020-06-16 DIAGNOSIS — M545 Low back pain: Secondary | ICD-10-CM | POA: Diagnosis not present

## 2020-06-16 DIAGNOSIS — R109 Unspecified abdominal pain: Secondary | ICD-10-CM

## 2020-06-16 DIAGNOSIS — Z3A18 18 weeks gestation of pregnancy: Secondary | ICD-10-CM | POA: Diagnosis not present

## 2020-06-16 DIAGNOSIS — J45909 Unspecified asthma, uncomplicated: Secondary | ICD-10-CM | POA: Insufficient documentation

## 2020-06-16 DIAGNOSIS — Z7951 Long term (current) use of inhaled steroids: Secondary | ICD-10-CM | POA: Insufficient documentation

## 2020-06-16 DIAGNOSIS — Z8673 Personal history of transient ischemic attack (TIA), and cerebral infarction without residual deficits: Secondary | ICD-10-CM | POA: Insufficient documentation

## 2020-06-16 DIAGNOSIS — D573 Sickle-cell trait: Secondary | ICD-10-CM | POA: Insufficient documentation

## 2020-06-16 DIAGNOSIS — I252 Old myocardial infarction: Secondary | ICD-10-CM | POA: Diagnosis not present

## 2020-06-16 DIAGNOSIS — O26892 Other specified pregnancy related conditions, second trimester: Secondary | ICD-10-CM

## 2020-06-16 DIAGNOSIS — M549 Dorsalgia, unspecified: Secondary | ICD-10-CM

## 2020-06-16 DIAGNOSIS — O99012 Anemia complicating pregnancy, second trimester: Secondary | ICD-10-CM | POA: Insufficient documentation

## 2020-06-16 DIAGNOSIS — Z369 Encounter for antenatal screening, unspecified: Secondary | ICD-10-CM

## 2020-06-16 DIAGNOSIS — O4692 Antepartum hemorrhage, unspecified, second trimester: Secondary | ICD-10-CM | POA: Insufficient documentation

## 2020-06-16 DIAGNOSIS — O99512 Diseases of the respiratory system complicating pregnancy, second trimester: Secondary | ICD-10-CM | POA: Diagnosis not present

## 2020-06-16 DIAGNOSIS — R8271 Bacteriuria: Secondary | ICD-10-CM

## 2020-06-16 LAB — URINALYSIS, ROUTINE W REFLEX MICROSCOPIC
Bilirubin Urine: NEGATIVE
Glucose, UA: NEGATIVE mg/dL
Ketones, ur: 20 mg/dL — AB
Nitrite: NEGATIVE
Protein, ur: NEGATIVE mg/dL
Specific Gravity, Urine: 1.021 (ref 1.005–1.030)
pH: 5 (ref 5.0–8.0)

## 2020-06-16 LAB — WET PREP, GENITAL
Clue Cells Wet Prep HPF POC: NONE SEEN
Sperm: NONE SEEN
Trich, Wet Prep: NONE SEEN
Yeast Wet Prep HPF POC: NONE SEEN

## 2020-06-16 MED ORDER — ACETAMINOPHEN 500 MG PO TABS: 1000.0000 mg | ORAL_TABLET | Freq: Once | ORAL | Status: DC

## 2020-06-16 MED ORDER — CYCLOBENZAPRINE HCL 5 MG PO TABS
10.0000 mg | ORAL_TABLET | Freq: Once | ORAL | Status: AC
  Administered 2020-06-16: 10 mg via ORAL
  Filled 2020-06-16: qty 2

## 2020-06-16 MED ORDER — IBUPROFEN 800 MG PO TABS
800.0000 mg | ORAL_TABLET | Freq: Once | ORAL | Status: AC
  Administered 2020-06-16: 800 mg via ORAL
  Filled 2020-06-16: qty 1

## 2020-06-16 MED ORDER — CEFADROXIL 500 MG PO CAPS: 500.0000 mg | ORAL_CAPSULE | Freq: Two times a day (BID) | ORAL | 0 refills | Status: DC

## 2020-06-16 NOTE — MAU Note (Signed)
Been cramping in lower abd since Mon/Tue, getting worse.  Pelvic pressure started this morning.  Also started spotting today.pt unable to sit down.

## 2020-06-16 NOTE — Discharge Instructions (Signed)
Abdominal Pain During Pregnancy  Abdominal pain is common during pregnancy, and has many possible causes. Some causes are more serious than others, and sometimes the cause is not known. Abdominal pain can be a sign that labor is starting. It can also be caused by normal growth and stretching of muscles and ligaments during pregnancy. Always tell your health care provider if you have any abdominal pain. Follow these instructions at home:  Do not have sex or put anything in your vagina until your pain goes away completely.  Get plenty of rest until your pain improves.  Drink enough fluid to keep your urine pale yellow.  Take over-the-counter and prescription medicines only as told by your health care provider.  Keep all follow-up visits as told by your health care provider. This is important. Contact a health care provider if:  Your pain continues or gets worse after resting.  You have lower abdominal pain that: ? Comes and goes at regular intervals. ? Spreads to your back. ? Is similar to menstrual cramps.  You have pain or burning when you urinate. Get help right away if:  You have a fever or chills.  You have vaginal bleeding.  You are leaking fluid from your vagina.  You are passing tissue from your vagina.  You have vomiting or diarrhea that lasts for more than 24 hours.  Your baby is moving less than usual.  You feel very weak or faint.  You have shortness of breath.  You develop severe pain in your upper abdomen. Summary  Abdominal pain is common during pregnancy, and has many possible causes.  If you experience abdominal pain during pregnancy, tell your health care provider right away.  Follow your health care provider's home care instructions and keep all follow-up visits as directed. This information is not intended to replace advice given to you by your health care provider. Make sure you discuss any questions you have with your health care  provider. Document Revised: 04/06/2019 Document Reviewed: 03/21/2017 Elsevier Patient Education  2020 [employer]  

## 2020-06-16 NOTE — MAU Provider Note (Signed)
History     CSN: [id number]  Arrival date and time: 06/16/20 1241   First Provider Initiated Contact with Patient 06/16/20 1325      Chief Complaint  Patient presents with  . Vaginal Bleeding  . Abdominal Pain  . pelvic pressure   Melissa [name] is a 34 y.o. G4P3003 at [redacted]w[redacted]d who receives care at [facility].  She presents today for Vaginal Bleeding, Abdominal Pain, and pelvic pressure.  She reports she has been having ongoing pain for the last 3 days (since Wednesday) which increased today.  Patient reports that the pain was so bad that she had to leave work and drove to the hospital with her hazards on.  Patient further reports some light spotting that she noted this morning and yesterday after sexual activity, but none upon arrival. Patient states that she had no pain or discomfort with sexual activity and took tylenol yesterday without relief of her symptoms. She also reports some vaginal discharge prior to the bleeding, but no odor.  Patient endorses fetal movement. She also reports some lower back pain that is increased with the onset of the intermittent abdominal cramping.  Patient rates the pain a 8/10 and states it is worsened with laying down.  Of Note, patient standing in room upon provider arrival and tearful (due to pain) when assisted into the bed for pelvic exam.      OB History    Gravida  4   Para  3   Term  3   Preterm      AB      Living  3     SAB      TAB      Ectopic      Multiple  0   Live Births  3           Past Medical History:  Diagnosis Date  . Abnormal Pap smear 2010   Repeat pap done;Hysteroscopy;Last pap 2012;was normal  . Anemia 2010   Iron supplements PP  . Asthma    Has a nebulizer;triggered by stress  . Chest pain   . Colon polyps   . Headache(784.0)   . Heart attack [facility])    MD thinks may have had a heart attack;Cardiologist @ [facility]  . History of CVA (cerebrovascular accident) 04/09/2013   Pt reports presumptive "stroke" when she  was 34y.o.; followed by [facility] neurology for "chronic nerve pain" and has been Rx'd Hydrocodone and Mobic  . Hypertension   . Infection    BV;may get frequently  . Multiple food allergies   . Nerve pain    Chronic;d/t possible stroke and MI  . Placenta previa antepartum 2010   Placenta moved 3 days before baby was born  . Pre-diabetes   . Shortness of breath   . Sickle cell trait (HCC)   . Stroke [facility]) 2013   MD thought may have had a stroke and heart attack;has neurologist @ [facility]  . Vitamin D deficiency     Past Surgical History:  Procedure Laterality Date  . HYSTEROSCOPY  2010  . LEFT HEART CATH AND CORONARY ANGIOGRAPHY N/A 02/18/2019   Procedure: LEFT HEART CATH AND CORONARY ANGIOGRAPHY;  Surgeon: [name] Lex, MD;  Location: [facility] INVASIVE CV LAB;  Service: Cardiovascular;  Laterality: N/A;    Family History  Problem Relation Age of Onset  . Sickle cell trait Father   . Hypertension Mother   . Anemia Mother   . Asthma Mother   .  Diabetes Mother   . Cervical cancer Paternal Aunt   . Cervical cancer Paternal Grandmother   . Diabetes Paternal Grandmother   . Diabetes Paternal Grandfather   . Diabetes Paternal Aunt   . Asthma Brother   . Asthma Daughter   . Asthma Son     Social History   Tobacco Use  . Smoking status: Never Smoker  . Smokeless tobacco: Never Used  Vaping Use  . Vaping Use: Never used  Substance Use Topics  . Alcohol use: Not Currently    Alcohol/week: 0.0 standard drinks    Comment: last use 03/16/2020  . Drug use: No    Allergies:  Allergies  Allergen Reactions  . Ginger Anaphylaxis    Medications Prior to Admission  Medication Sig Dispense Refill Last Dose  . acetaminophen (TYLENOL) 325 MG tablet Take 2 tablets (650 mg total) by mouth every 4 (four) hours as needed (for pain scale < 4  OR  temperature  >/=  100.5 F). 30 tablet 0   . albuterol (PROVENTIL HFA;VENTOLIN HFA) 108 (90 BASE) MCG/ACT inhaler Inhale 2 puffs  into the lungs every 4 (four) hours as needed for wheezing or shortness of breath. 1 Inhaler 12   . docusate sodium (COLACE) 100 MG capsule Take 1 capsule (100 mg total) by mouth daily. 10 capsule 0   . Doxylamine-Pyridoxine (DICLEGIS) 10-10 MG TBEC Take 1 tablet by mouth 2 (two) times daily as needed (nausea). 60 tablet 0   . metoCLOPramide (REGLAN) 10 MG tablet Take 1 tablet (10 mg total) by mouth every 6 (six) hours as needed for nausea (or headache). 30 tablet 0   . mometasone-formoterol (DULERA) 200-5 MCG/ACT AERO Inhale 2 puffs into the lungs 2 (two) times daily.     . ondansetron (ZOFRAN ODT) 4 MG disintegrating tablet Take 1 tablet (4 mg total) by mouth every 8 (eight) hours as needed for nausea or vomiting. 10 tablet 0   . pantoprazole (PROTONIX) 20 MG tablet Take 1 tablet (20 mg total) by mouth daily. 30 tablet 1   . Prenatal Vit-Fe Fumarate-FA (MULTIVITAMIN-PRENATAL) 27-0.8 MG TABS tablet Take 1 tablet by mouth daily at 12 noon. 30 tablet 0   . promethazine (PHENERGAN) 25 MG tablet Take 25 mg by mouth every 6 (six) hours as needed for nausea or vomiting.     [name] scopolamine (TRANSDERM-SCOP) 1 MG/3DAYS Place 1 patch (1.5 mg total) onto the skin every 3 (three) days. 10 patch 12     Review of Systems  Gastrointestinal: Positive for abdominal pain. Negative for nausea and vomiting.  Genitourinary: Positive for vaginal bleeding. Negative for vaginal discharge.  Neurological: Negative for dizziness, light-headedness and headaches.   Physical Exam   Blood pressure (!) 97/59, pulse 92, temperature 97.8 F (36.6 C), temperature source Oral, resp. rate 20, last menstrual period 02/19/2020, SpO2 100 %, unknown if currently breastfeeding.  Physical Exam  Constitutional: She is oriented to person, place, and time. She appears well-developed. She appears distressed (Mild).  HENT:  Head: Normocephalic and atraumatic.  Cardiovascular: Regular rhythm.  Respiratory: No respiratory distress.  GI:  There is abdominal tenderness in the right lower quadrant and left lower quadrant.  Gravid--Abdomen appears LGA, Mild tenderness in lower abdomen bilaterally and at fundus, Moderate tone throughout.    Genitourinary: Cervix exhibits discharge. Cervix exhibits no motion tenderness.    No vaginal discharge, tenderness or bleeding.  No tenderness or bleeding in the vagina.    Genitourinary Comments: Speculum Exam: -Normal External Genitalia:  Non tender, no apparent discharge at introitus.  -Vaginal Vault: Pink mucosa with good rugae. Scant amt thin white discharge -wet prep collected -Cervix:Pink, no lesions, cysts, or polyps.  Appears open with small amt yellowish discharge from os. No active bleeding from os-GC/CT collected -Bimanual Exam:  Closed Internal, FT external, 50%    Neurological: She is alert and oriented to person, place, and time.  Skin: Skin is warm and dry.   Doppler: 151 .bpm MAU Course  Procedures Results for orders placed or performed during the hospital encounter of 06/16/20 (from the past 24 hour(s))  Wet prep, genital     Status: Abnormal   Collection Time: 06/16/20  1:17 PM  Result Value Ref Range   Yeast Wet Prep HPF POC NONE SEEN NONE SEEN   Trich, Wet Prep NONE SEEN NONE SEEN   Clue Cells Wet Prep HPF POC NONE SEEN NONE SEEN   WBC, Wet Prep HPF POC MANY (A) NONE SEEN   Sperm NONE SEEN   Urinalysis, Routine w reflex microscopic     Status: Abnormal   Collection Time: 06/16/20  2:38 PM  Result Value Ref Range   Color, Urine YELLOW YELLOW   APPearance HAZY (A) CLEAR   Specific Gravity, Urine 1.021 1.005 - 1.030   pH 5.0 5.0 - 8.0   Glucose, UA NEGATIVE NEGATIVE mg/dL   Hgb urine dipstick SMALL (A) NEGATIVE   Bilirubin Urine NEGATIVE NEGATIVE   Ketones, ur 20 (A) NEGATIVE mg/dL   Protein, ur NEGATIVE NEGATIVE mg/dL   Nitrite NEGATIVE NEGATIVE   Leukocytes,Ua TRACE (A) NEGATIVE   RBC / HPF 0-5 0 - 5 RBC/hpf   WBC, UA 6-10 0 - 5 WBC/hpf   Bacteria, UA FEW  (A) NONE SEEN   Squamous Epithelial / LPF 6-10 0 - 5   Mucus PRESENT    [place] MFM OB Limited  Result Date: 06/16/2020 ----------------------------------------------------------------------  OBSTETRICS REPORT                       (Signed Final 06/16/2020 02:38 pm) ---------------------------------------------------------------------- Patient Info  ID #:       [id number]                          D.O.B.:  Jan 06, 1986 (33 yrs)  Name:       Melissa [name]                    Visit Date: 06/16/2020 02:00 pm ---------------------------------------------------------------------- Performed By  Attending:        Lin Landsman      Referred By:       [facility] MAU/Triage                    MD  Performed By:     Percell Boston          [facility] ---------------------------------------------------------------------- Orders  #  Description  Code        Ordered By  1  [place] MFM OB LIMITED                     [id number]    Valari Taylor ----------------------------------------------------------------------  #  Order #                     Accession #                Episode #  1  [id number]                   [id number]                 [id number] ---------------------------------------------------------------------- Indications  [redacted] weeks gestation of pregnancy                 Z3A.18  Abdominal pain in pregnancy                     O99.89  Encounter for antenatal screening,              Z36.9  unspecified ---------------------------------------------------------------------- Fetal Evaluation  Num Of Fetuses:          1  Fetal Heart              144  Rate(bpm):  Cardiac Activity:        Observed  Presentation:            Cephalic  Placenta:                Anterior  P. Cord Insertion:       Visualized, central  Amniotic Fluid  AFI FV:      Within normal limits                              Largest Pocket(cm)                               3.7 ---------------------------------------------------------------------- OB History  Gravidity:    4         Term:   3        Prem:   0         SAB:   0  TOP:          0       Ectopic:  0        Living: 3 ---------------------------------------------------------------------- Gestational Age  LMP:           16w 6d        Date:  02/19/20                 EDD:    11/25/20  Best:          Mackie Pai 4d     Det. ByMarcella Dubs         EDD:    11/13/20                                      (03/21/20) ---------------------------------------------------------------------- Anatomy  Thoracic:              Appears normal         Bladder:  Appears normal  Stomach:               Appears normal,                         left sided ---------------------------------------------------------------------- Cervix Uterus Adnexa  Cervix  Length:            3.5  cm.  Normal appearance by transabdominal scan.  Uterus  No abnormality visualized.  Right Ovary  No adnexal mass visualized.  Left Ovary  No adnexal mass visualized.  Cul De Sac  No free fluid seen.  Adnexa  No abnormality visualized. ---------------------------------------------------------------------- Impression  Limited exam to for maternal abdominal pain  Normal amniotic fluid and fetal movement ---------------------------------------------------------------------- Recommendations  Follow up as clinically indicated. ----------------------------------------------------------------------               Sander Nephew, MD Electronically Signed Final Report   06/16/2020 02:38 pm ----------------------------------------------------------------------   MDM Pelvic Exam; Wet Prep and GC/CT Labs: UA Ultrasound Pain Medication Assessment and Plan  34 year old, G4P3003  SIUP at 18.4 weeks Abdominal Pain Back Pain ?Short Cervix  -Reviewed POC with patient. -Exam performed and findings discussed.  -Cultures collected and pending.  -Informed of  suspicion of short cervix and will send for [place]. -Will give flexeril 10 mg for associated pain.  -Will await results  Maryann Conners 06/16/2020, 1:26 PM   Reassessment (2:47 PM) Abdominal Pain Back Pain-Improved  -Patient reports back pain has improved with flexeril dosing. -CL appropriate. -Reports intermittent lower abdominal pain that radiates to back.  -Will give ibuprofen and reassess. -Patient encouraged to leave urine specimen.   Reassessment (3:42 PM)  -Patient laying in bed eating, reports improvement in pain.  -UA returns with +leuks, +bacteria, +hgb -Patient informed of results and will send for culture. -Rx for Duricef 500mg  BID x 5 days. -Discussed if results return negative will call and discontinue medication. -Patient to be OOW tomorrow and reports vacation time scheduled from Saturday until Tuesday. -Reports next appt is Tuesday June 21, 2020. -Patient without questions or concerns.  -Encouraged to call primary ob or return to MAU if symptoms worsen or with the onset of new symptoms. -Discharged to home in stable condition.  Maryann Conners MSN, CNM Advanced Practice Provider, [facility] for [facility]

## 2020-06-17 LAB — GC/CHLAMYDIA PROBE AMP (~~LOC~~) NOT AT ARMC
Chlamydia: NEGATIVE
Comment: NEGATIVE
Comment: NORMAL
Neisseria Gonorrhea: NEGATIVE

## 2020-06-21 DIAGNOSIS — Z3493 Encounter for supervision of normal pregnancy, unspecified, third trimester: Secondary | ICD-10-CM | POA: Diagnosis not present

## 2020-07-06 DIAGNOSIS — Z363 Encounter for antenatal screening for malformations: Secondary | ICD-10-CM | POA: Diagnosis not present

## 2020-07-06 DIAGNOSIS — Z3A21 21 weeks gestation of pregnancy: Secondary | ICD-10-CM | POA: Diagnosis not present

## 2020-07-26 ENCOUNTER — Encounter (HOSPITAL_COMMUNITY): Payer: Self-pay | Admitting: Obstetrics & Gynecology

## 2020-07-26 ENCOUNTER — Observation Stay (HOSPITAL_BASED_OUTPATIENT_CLINIC_OR_DEPARTMENT_OTHER): Payer: BC Managed Care – PPO

## 2020-07-26 ENCOUNTER — Observation Stay (HOSPITAL_COMMUNITY)
Admission: AD | Admit: 2020-07-26 | Discharge: 2020-07-27 | Disposition: A | Discharge status: Home / Self Care | Payer: BC Managed Care – PPO | Attending: Obstetrics and Gynecology | Admitting: Obstetrics and Gynecology

## 2020-07-26 ENCOUNTER — Other Ambulatory Visit: Payer: Self-pay

## 2020-07-26 ENCOUNTER — Observation Stay (HOSPITAL_COMMUNITY): Payer: BC Managed Care – PPO

## 2020-07-26 DIAGNOSIS — J45909 Unspecified asthma, uncomplicated: Secondary | ICD-10-CM | POA: Diagnosis not present

## 2020-07-26 DIAGNOSIS — O10912 Unspecified pre-existing hypertension complicating pregnancy, second trimester: Secondary | ICD-10-CM | POA: Diagnosis not present

## 2020-07-26 DIAGNOSIS — Z7951 Long term (current) use of inhaled steroids: Secondary | ICD-10-CM | POA: Insufficient documentation

## 2020-07-26 DIAGNOSIS — W501XXA Accidental kick by another person, initial encounter: Secondary | ICD-10-CM | POA: Insufficient documentation

## 2020-07-26 DIAGNOSIS — S3991XA Unspecified injury of abdomen, initial encounter: Secondary | ICD-10-CM | POA: Insufficient documentation

## 2020-07-26 DIAGNOSIS — O99012 Anemia complicating pregnancy, second trimester: Secondary | ICD-10-CM | POA: Diagnosis not present

## 2020-07-26 DIAGNOSIS — Z3A24 24 weeks gestation of pregnancy: Secondary | ICD-10-CM | POA: Diagnosis not present

## 2020-07-26 DIAGNOSIS — Z8673 Personal history of transient ischemic attack (TIA), and cerebral infarction without residual deficits: Secondary | ICD-10-CM | POA: Diagnosis not present

## 2020-07-26 DIAGNOSIS — O99512 Diseases of the respiratory system complicating pregnancy, second trimester: Secondary | ICD-10-CM | POA: Insufficient documentation

## 2020-07-26 DIAGNOSIS — I252 Old myocardial infarction: Secondary | ICD-10-CM | POA: Insufficient documentation

## 2020-07-26 DIAGNOSIS — D573 Sickle-cell trait: Secondary | ICD-10-CM | POA: Diagnosis not present

## 2020-07-26 DIAGNOSIS — Z20822 Contact with and (suspected) exposure to covid-19: Secondary | ICD-10-CM | POA: Insufficient documentation

## 2020-07-26 DIAGNOSIS — Z79899 Other long term (current) drug therapy: Secondary | ICD-10-CM | POA: Diagnosis not present

## 2020-07-26 DIAGNOSIS — O9A212 Injury, poisoning and certain other consequences of external causes complicating pregnancy, second trimester: Secondary | ICD-10-CM

## 2020-07-26 DIAGNOSIS — O26892 Other specified pregnancy related conditions, second trimester: Secondary | ICD-10-CM | POA: Diagnosis not present

## 2020-07-26 DIAGNOSIS — O210 Mild hyperemesis gravidarum: Secondary | ICD-10-CM | POA: Insufficient documentation

## 2020-07-26 DIAGNOSIS — T1490XA Injury, unspecified, initial encounter: Secondary | ICD-10-CM | POA: Diagnosis not present

## 2020-07-26 DIAGNOSIS — O4692 Antepartum hemorrhage, unspecified, second trimester: Principal | ICD-10-CM | POA: Insufficient documentation

## 2020-07-26 LAB — CBC
HCT: 32.5 % — ABNORMAL LOW (ref 36.0–46.0)
Hemoglobin: 10.6 g/dL — ABNORMAL LOW (ref 12.0–15.0)
MCH: 28.3 pg (ref 26.0–34.0)
MCHC: 32.6 g/dL (ref 30.0–36.0)
MCV: 86.9 fL (ref 80.0–100.0)
Platelets: 210 10*3/uL (ref 150–400)
RBC: 3.74 MIL/uL — ABNORMAL LOW (ref 3.87–5.11)
RDW: 13.8 % (ref 11.5–15.5)
WBC: 7.2 10*3/uL (ref 4.0–10.5)
nRBC: 0 % (ref 0.0–0.2)

## 2020-07-26 LAB — SARS CORONAVIRUS 2 BY RT PCR (HOSPITAL ORDER, PERFORMED IN ~~LOC~~ HOSPITAL LAB): SARS Coronavirus 2: NEGATIVE

## 2020-07-26 LAB — URINALYSIS, ROUTINE W REFLEX MICROSCOPIC
Bilirubin Urine: NEGATIVE
Glucose, UA: NEGATIVE mg/dL
Hgb urine dipstick: NEGATIVE
Ketones, ur: NEGATIVE mg/dL
Nitrite: NEGATIVE
Protein, ur: NEGATIVE mg/dL
Specific Gravity, Urine: 1.026 (ref 1.005–1.030)
pH: 5 (ref 5.0–8.0)

## 2020-07-26 LAB — KLEIHAUER-BETKE STAIN
# Vials RhIg: 1
Fetal Cells %: 0 %
Quantitation Fetal Hemoglobin: 0 mL

## 2020-07-26 LAB — TYPE AND SCREEN
ABO/RH(D): O POS
Antibody Screen: NEGATIVE

## 2020-07-26 MED ORDER — SODIUM CHLORIDE 0.9% FLUSH: 3.0000 mL | INTRAVENOUS | Status: DC | PRN

## 2020-07-26 MED ORDER — CALCIUM CARBONATE ANTACID 500 MG PO CHEW: 2.0000 | CHEWABLE_TABLET | ORAL | Status: DC | PRN

## 2020-07-26 MED ORDER — DOCUSATE SODIUM 100 MG PO CAPS
100.0000 mg | ORAL_CAPSULE | Freq: Every day | ORAL | Status: DC
  Administered 2020-07-27: 100 mg via ORAL
  Filled 2020-07-26: qty 1

## 2020-07-26 MED ORDER — OXYCODONE HCL 5 MG PO TABS
5.0000 mg | ORAL_TABLET | ORAL | Status: DC | PRN
  Administered 2020-07-26 – 2020-07-27 (×2): 5 mg via ORAL
  Filled 2020-07-26 (×3): qty 1

## 2020-07-26 MED ORDER — ACETAMINOPHEN 325 MG PO TABS
650.0000 mg | ORAL_TABLET | ORAL | Status: DC | PRN
  Administered 2020-07-27 (×2): 650 mg via ORAL
  Filled 2020-07-26 (×2): qty 2

## 2020-07-26 MED ORDER — ZOLPIDEM TARTRATE 5 MG PO TABS: 5.0000 mg | ORAL_TABLET | Freq: Every evening | ORAL | Status: DC | PRN

## 2020-07-26 MED ORDER — SODIUM CHLORIDE 0.9 % IV SOLN: 250.0000 mL | INTRAVENOUS | Status: DC | PRN

## 2020-07-26 MED ORDER — PRENATAL MULTIVITAMIN CH
1.0000 | ORAL_TABLET | Freq: Every day | ORAL | Status: DC
  Administered 2020-07-27: 1 via ORAL
  Filled 2020-07-26: qty 1

## 2020-07-26 MED ORDER — SODIUM CHLORIDE 0.9% FLUSH
3.0000 mL | Freq: Two times a day (BID) | INTRAVENOUS | Status: DC
  Administered 2020-07-27: 3 mL via INTRAVENOUS

## 2020-07-26 NOTE — H&P (Addendum)
OB ADMISSION/ HISTORY & PHYSICAL:  Admission Date: 07/26/2020  7:01 PM  Admit Diagnosis: Abdominal trauma, initial encounter [S39.91XA]    Melissa [name] is a 34 y.o. female presenting for abdominal trauma. Pt states she was playing with her 43 year old daughter around 51 and the child kicked her directly in the abdomen. Pt reports that she immediately started having lower abdominal cramping and pressure. She presented to the MAU rating her pain is 7/10 on the pain scale and describes it as cramping in her abdomen and back. Denies leaking of fluid or vaginal bleeding. Endorses + fetal movement. Pregnancy has been complicated by hyperemesis.   Prenatal History: [id number]   EDC : 11/13/2020, by Ultrasound  P[facility] care at [facility] since 14 weeks   Prenatal course complicated by: 1. Benign essential hypertension - Hx of taking Labetalol, amlodipine. Medication stopped about 6 months before current pregnancy as her blood pressures normalized. 2. Asthma - on Pulmocort and Dulera. 3. Hyperemesis gravidarum - controlled by steroids.  Prenatal Labs: ABO, Rh:   O POS Antibody: PENDING (07/27 2020) Rubella:   Immune RPR:   Non-reactive HBsAg:   Negative HIV:   Negative Genetic Screening: AFP negative Ultrasound: Anatomy U/S 07/06/20 SINGLETON PREGNANCY. BREECH PRESENTATION. ANTERIOR PLACENTA, PLACENTAL EDGE MEASURES 7.2CM FROM INTERNAL OS. CERVIX APPEARS CLOSED AND MEASURES 3.7CM TRANSABDOMINALLY. AMNIOTIC FLUID APPEARS NORMAL.  ANATOMY SEEN: CHOROID PLEXUS, THALMUS, ORBITS, PALATE, NASAL BONE, PROFILE, DIAPHRAGM, CORONAL SPINE, 3VTV, 3VV, RENAL ARTERIES, OPEN HANDS, HEELS. ANATOMY COMPLETE.    Maternal Diabetes: No Genetic Screening: Declined Maternal Ultrasounds/Referrals: Normal Fetal Ultrasounds or other Referrals:  None Maternal Substance Abuse:  No Significant Maternal Medications:  Meds include: Protonix Significant Maternal Lab Results:  None Other Comments:  None  Medical / Surgical  History :  Past medical history:  Past Medical History:  Diagnosis Date  . Abnormal Pap smear 2010   Repeat pap done;Hysteroscopy;Last pap 2012;was normal  . Anemia 2010   Iron supplements PP  . Asthma    Has a nebulizer;triggered by stress  . Chest pain   . Colon polyps   . Headache(784.0)   . Heart attack [facility])    MD thinks may have had a heart attack;Cardiologist @ [facility]  . History of CVA (cerebrovascular accident) 04/09/2013   Pt reports presumptive "stroke" when she was 34y.o.; followed by [facility] neurology for "chronic nerve pain" and has been Rx'd Hydrocodone and Mobic  . Hypertension   . Infection    BV;may get frequently  . Multiple food allergies   . Nerve pain    Chronic;d/t possible stroke and MI  . Placenta previa antepartum 2010   Placenta moved 3 days before baby was born  . Pre-diabetes   . Shortness of breath   . Sickle cell trait (HCC)   . Stroke [facility]) 2013   MD thought may have had a stroke and heart attack;has neurologist @ [facility]  . Vitamin D deficiency      Past surgical history:  Past Surgical History:  Procedure Laterality Date  . HYSTEROSCOPY  2010  . LEFT HEART CATH AND CORONARY ANGIOGRAPHY N/A 02/18/2019   Procedure: LEFT HEART CATH AND CORONARY ANGIOGRAPHY;  Surgeon: [name] Lex, MD;  Location: [facility] INVASIVE CV LAB;  Service: Cardiovascular;  Laterality: N/A;     Family History:  Family History  Problem Relation Age of Onset  . Sickle cell trait Father   . Hypertension Mother   . Anemia Mother   . Asthma Mother   .  Diabetes Mother   . Cervical cancer Paternal Aunt   . Cervical cancer Paternal Grandmother   . Diabetes Paternal Grandmother   . Diabetes Paternal Grandfather   . Diabetes Paternal Aunt   . Asthma Brother   . Asthma Daughter   . Asthma Son      Social History:  reports that she has never smoked. She has never used smokeless tobacco. She reports previous alcohol use. She reports that she does not use  drugs.   Allergies: Ginger   Current Medications at time of admission:  Medications Prior to Admission  Medication Sig Dispense Refill Last Dose  . acetaminophen (TYLENOL) 325 MG tablet Take 2 tablets (650 mg total) by mouth every 4 (four) hours as needed (for pain scale < 4  OR  temperature  >/=  100.5 F). 30 tablet 0 07/25/2020 at Unknown time  . fluticasone (FLONASE) 50 MCG/ACT nasal spray Place 2 sprays into both nostrils daily.   07/26/2020 at Unknown time  . metoCLOPramide (REGLAN) 10 MG tablet Take 1 tablet (10 mg total) by mouth every 6 (six) hours as needed for nausea (or headache). 30 tablet 0 Past Week at 07/24/20  . scopolamine (TRANSDERM-SCOP) 1 MG/3DAYS Place 1 patch (1.5 mg total) onto the skin every 3 (three) days. 10 patch 12 07/25/2020 at Unknown time  . albuterol (PROVENTIL HFA;VENTOLIN HFA) 108 (90 BASE) MCG/ACT inhaler Inhale 2 puffs into the lungs every 4 (four) hours as needed for wheezing or shortness of breath. 1 Inhaler 12   . cefadroxil (DURICEF) 500 MG capsule Take 1 capsule (500 mg total) by mouth 2 (two) times daily. 10 capsule 0   . docusate sodium (COLACE) 100 MG capsule Take 1 capsule (100 mg total) by mouth daily. 10 capsule 0   . Doxylamine-Pyridoxine (DICLEGIS) 10-10 MG TBEC Take 1 tablet by mouth 2 (two) times daily as needed (nausea). 60 tablet 0   . mometasone-formoterol (DULERA) 200-5 MCG/ACT AERO Inhale 2 puffs into the lungs 2 (two) times daily.     . ondansetron (ZOFRAN ODT) 4 MG disintegrating tablet Take 1 tablet (4 mg total) by mouth every 8 (eight) hours as needed for nausea or vomiting. 10 tablet 0   . pantoprazole (PROTONIX) 20 MG tablet Take 1 tablet (20 mg total) by mouth daily. 30 tablet 1   . Prenatal Vit-Fe Fumarate-FA (MULTIVITAMIN-PRENATAL) 27-0.8 MG TABS tablet Take 1 tablet by mouth daily at 12 noon. 30 tablet 0   . promethazine (PHENERGAN) 25 MG tablet Take 25 mg by mouth every 6 (six) hours as needed for nausea or vomiting.       Review  of Systems: Review of Systems  Constitutional: Negative for chills and fever.  HENT: Negative for congestion and sore throat.   Eyes: Negative for blurred vision and photophobia.  Respiratory: Negative for cough and shortness of breath.   Cardiovascular: Negative for chest pain and leg swelling.  Gastrointestinal: Positive for abdominal pain, heartburn, nausea and vomiting. Negative for diarrhea.  Musculoskeletal: Positive for back pain. Negative for falls.  Neurological: Negative for weakness and headaches.  Psychiatric/Behavioral: Negative for depression. The patient is not nervous/anxious.    Physical Exam: Vital signs and nursing notes reviewed.  Patient Vitals for the past 24 hrs:  BP Temp Temp src Pulse Resp SpO2 Height Weight  07/26/20 1958 (!) 118/62 -- -- 91 -- 100 % -- --  07/26/20 1923 -- 98.8 F (37.1 C) Oral 102 18 100 % 5\' 5"  (1.651 m) 88.7 kg  General: AAO x 3 Heart: RRR Lungs:CTAB Abdomen: Gravid, NT Extremities: no edema Genitalia / VE:   deferred  FHR: 130BPM, mod variability, + accels, no decels TOCO: Ctx none  Labs:   Pending T&S, CBC, RPR  Recent Labs    07/26/20 2020  WBC 7.2  HGB 10.6*  HCT 32.5*  PLT 210    Assessment:  33 y.o. G4P3003 at [redacted]w[redacted]d  1. Abdominal trauma 2. FHR category 1 3. Hyperemesis  Plan:  1. Admit to antepartum for observation 2. Continuous fetal monitoring 3. MFM ultrasound 4. KB pending 5. Oxy IR for pain 6. Saline lock 7. Clear liquids  Dr. Richardson Dopp notified of admission/plan of care  June Leap CNM, MSN 07/26/2020, 8:48 PM

## 2020-07-26 NOTE — Plan of Care (Signed)
?  Problem: Education: ?Goal: Knowledge of disease or condition will improve ?Outcome: Progressing ?  ?Problem: Education: ?Goal: Knowledge of the prescribed therapeutic regimen will improve ?Outcome: Progressing ?  ?Problem: Education: ?Goal: Individualized Educational Video(s) ?Outcome: Progressing ?  ?Problem: Clinical Measurements: ?Goal: Complications related to the disease process, condition or treatment will be avoided or minimized ?Outcome: Progressing ?  ?

## 2020-07-26 NOTE — MAU Note (Signed)
Pt reports she was playing with her kids about 1.5 hours ago and her 33 year old kicked her in the stomach. Pt reports afterwards she started having lower abdominal and back cramping. She also reports a lot of pressure from her belly button into her pelvis. She also reports that she "feels wet down there", but is not wearing a pad or had a big gush. Pt denies vaginal bleeding. She reports that she felt the baby move right after she was kicked but nothing since.
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
## 2020-07-27 DIAGNOSIS — Z3A24 24 weeks gestation of pregnancy: Secondary | ICD-10-CM | POA: Diagnosis not present

## 2020-07-27 DIAGNOSIS — S3991XA Unspecified injury of abdomen, initial encounter: Secondary | ICD-10-CM | POA: Diagnosis not present
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
## 2020-07-27 NOTE — Progress Notes (Signed)
Pts teaching complete ready for discharge
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
## 2020-08-10 DIAGNOSIS — R87610 Atypical squamous cells of undetermined significance on cytologic smear of cervix (ASC-US): Secondary | ICD-10-CM | POA: Insufficient documentation

## 2020-08-10 DIAGNOSIS — I87309 Chronic venous hypertension (idiopathic) without complications of unspecified lower extremity: Secondary | ICD-10-CM | POA: Diagnosis not present

## 2020-08-10 DIAGNOSIS — R109 Unspecified abdominal pain: Secondary | ICD-10-CM | POA: Diagnosis not present

## 2020-08-17 DIAGNOSIS — Z3A27 27 weeks gestation of pregnancy: Secondary | ICD-10-CM | POA: Diagnosis not present

## 2020-08-17 DIAGNOSIS — Z369 Encounter for antenatal screening, unspecified: Secondary | ICD-10-CM | POA: Diagnosis not present

## 2020-08-17 DIAGNOSIS — O169 Unspecified maternal hypertension, unspecified trimester: Secondary | ICD-10-CM | POA: Diagnosis not present

## 2020-08-18 DIAGNOSIS — O99019 Anemia complicating pregnancy, unspecified trimester: Secondary | ICD-10-CM | POA: Insufficient documentation

## 2020-08-21 IMAGING — US US OB COMP LESS 14 WK
1 series · 15 of 28 positions shown · non-contrast
Comparison: None relevant.

CLINICAL DATA: 33-year-old female with bleeding and lower abdominal
pain in the 1st trimester of pregnancy. Quantitative beta HCG
[DATE]. Although estimated gestational age by LMP 4 weeks and 3
days.

EXAM:
OBSTETRIC <14 WK ULTRASOUND
TECHNIQUE: Transabdominal ultrasound was performed for evaluation of the
gestation as well as the maternal uterus and adnexal regions.

[Series 1: us ob comp less 14 wk · 33 acquisitions, 15 frames shown]
[im 1/33]
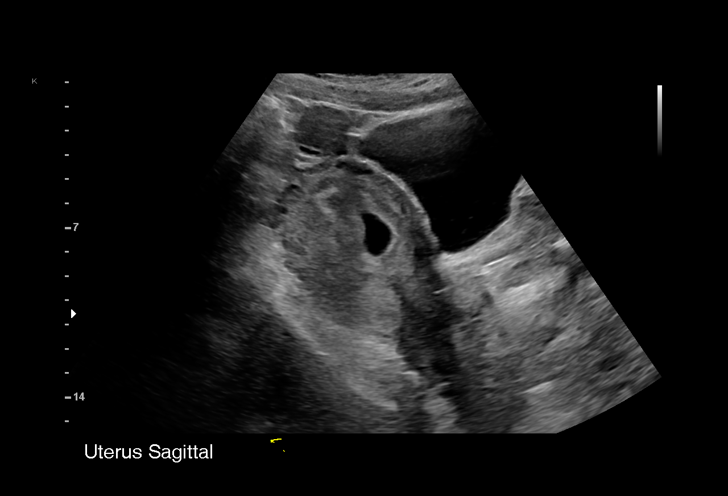
[im 3/33]
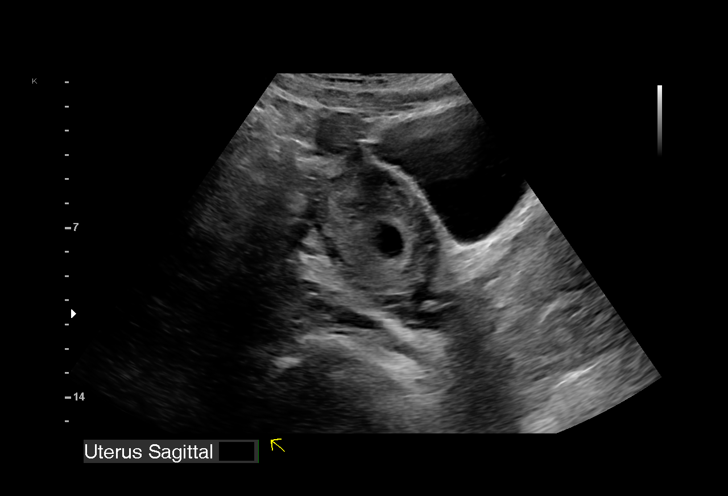
[im 5/33]
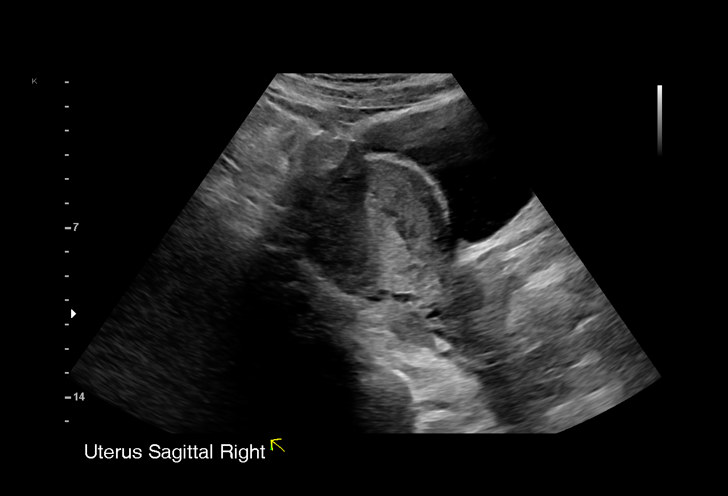
[im 8/33]
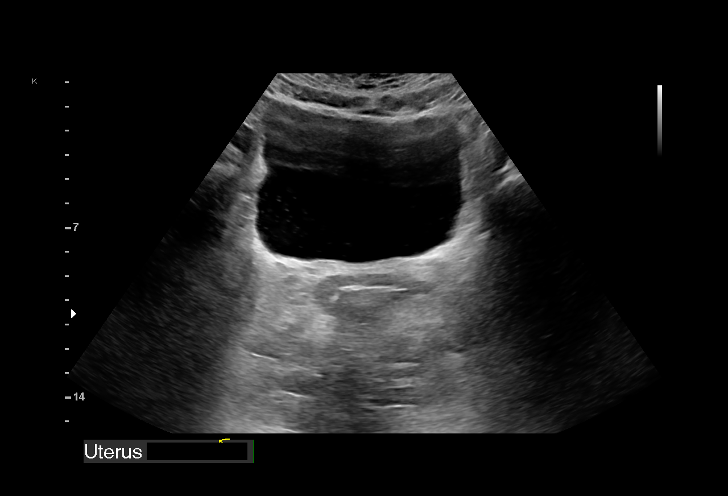
[im 10/33]
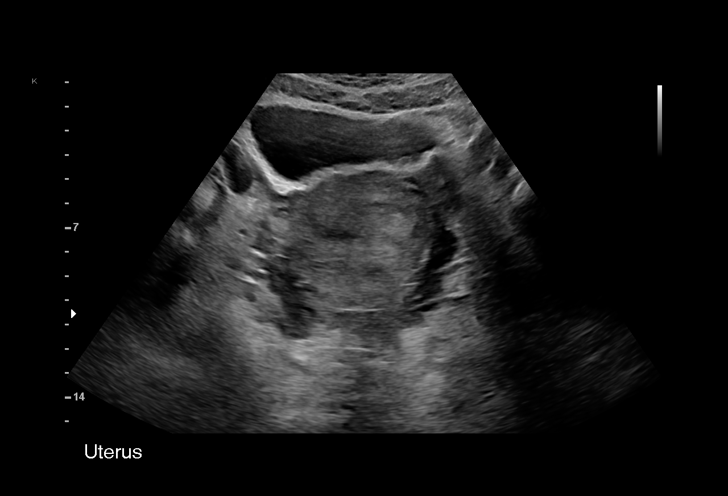
[im 12/33]
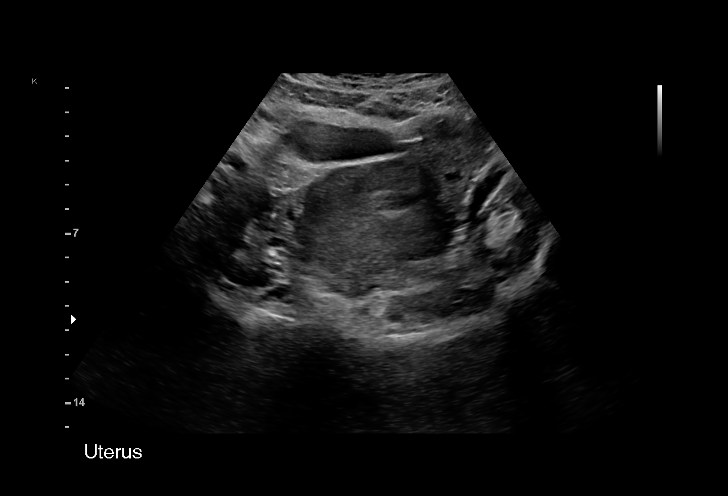
[im 15/33]
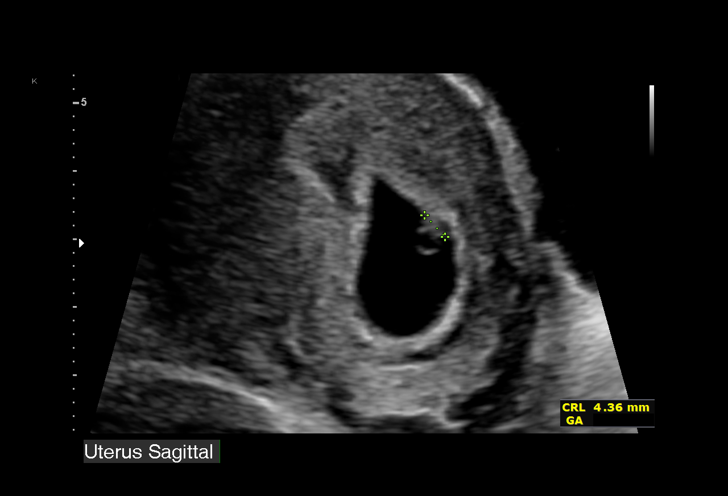
[im 17/33]
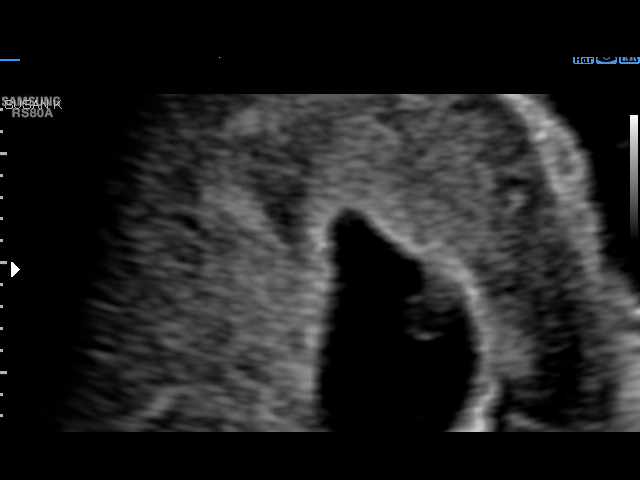
[im 18/33]
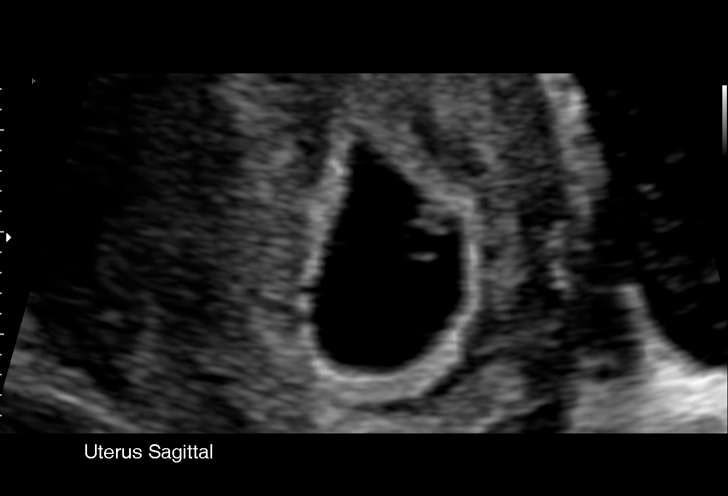
[im 21/33]
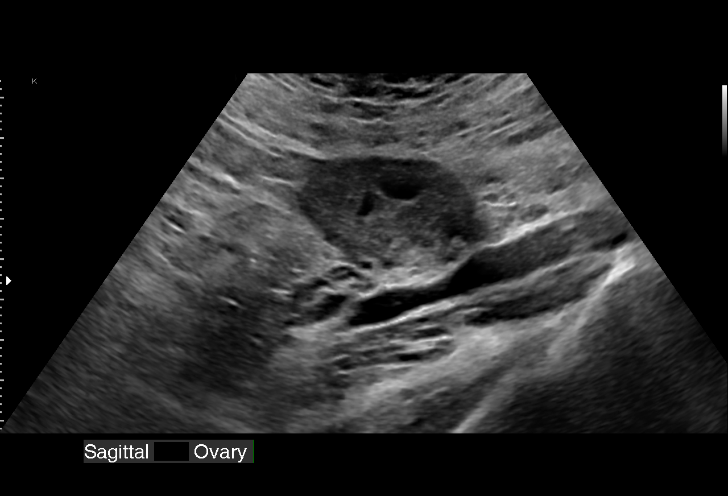
[im 23/33]
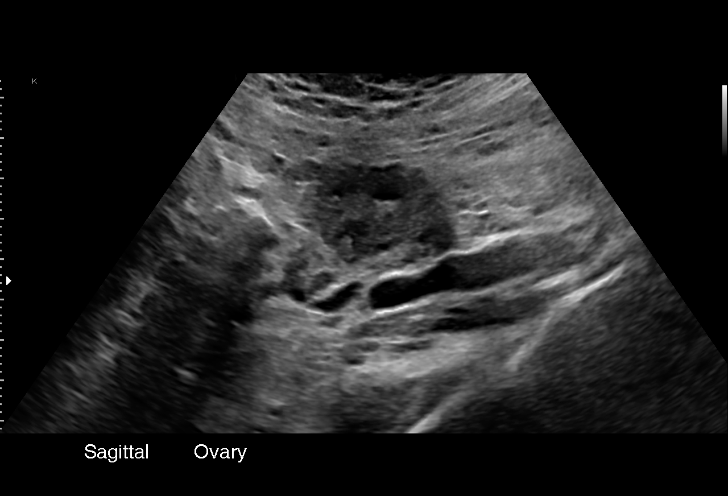
[im 25/33]
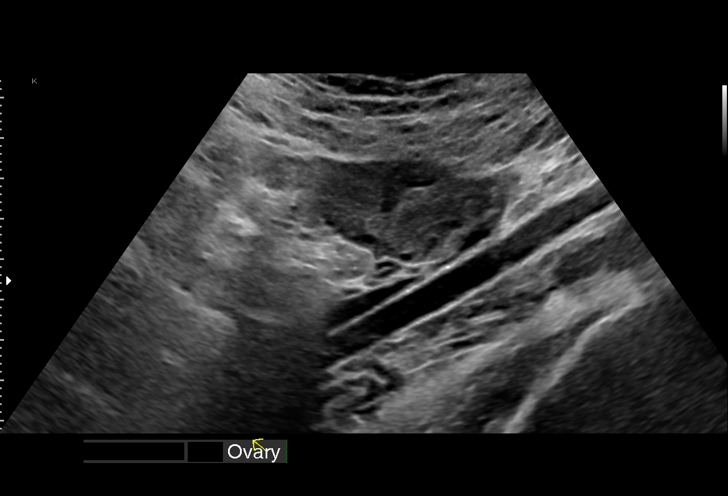
[im 28/33]
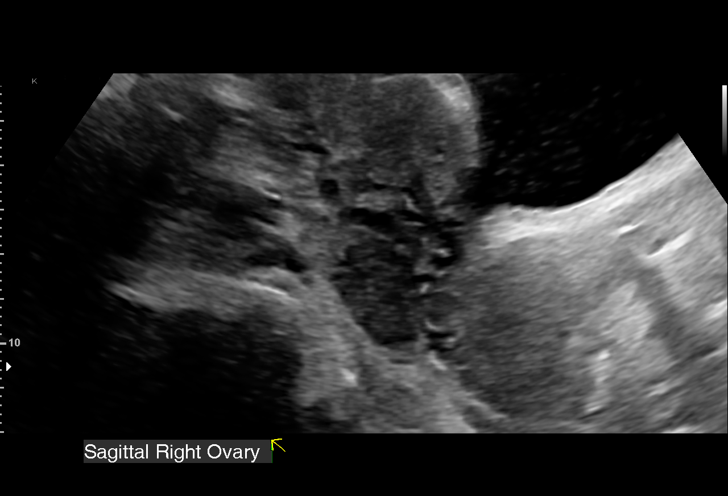
[im 30/33]
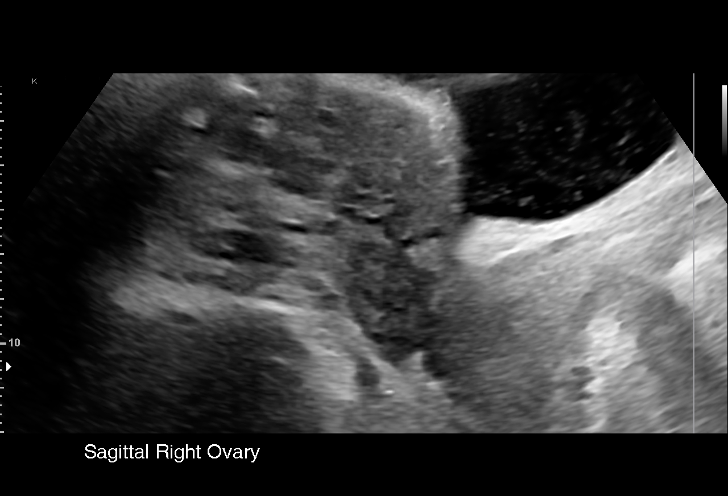
[im 33/33]
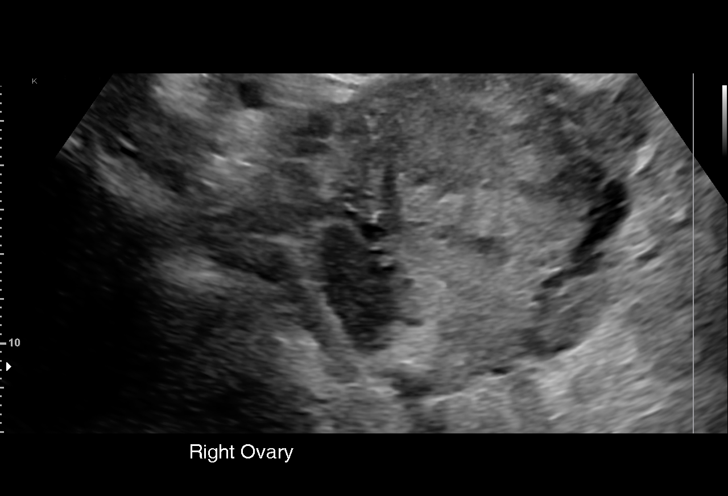

[15 of 28 positions shown; findings below may reference images not displayed]

FINDINGS: Intrauterine gestational sac: Single

Yolk sac:  Visible

Embryo:  Visible

Cardiac Activity: Detected

Heart Rate: 112 bpm

CRL:   4.2 mm   6 w 1 d                  US EDC: 11/13/2020

Subchorionic hemorrhage:  None visualized.

Maternal uterus/adnexae: The left ovary appears normal measuring
x 2.7 x 3.6 cm. The right ovary appears normal measuring 2.7 x 1.7 x
1.5 cm. No pelvic free fluid. However, there does appear to be
echogenic debris within the urinary bladder on images 9 and 29.
IMPRESSION: 1. Single living IUP demonstrated. No acute maternal findings
visualized.
2. Echogenic debris floating within the urinary bladder, query UTI.

## 2020-09-01 ENCOUNTER — Inpatient Hospital Stay (HOSPITAL_COMMUNITY)
Admission: AD | Admit: 2020-09-01 | Discharge: 2020-09-01 | Disposition: A | Discharge status: Home / Self Care | Payer: BC Managed Care – PPO | Attending: Obstetrics & Gynecology | Admitting: Obstetrics & Gynecology

## 2020-09-01 ENCOUNTER — Encounter (HOSPITAL_COMMUNITY): Payer: Self-pay | Admitting: Obstetrics & Gynecology

## 2020-09-01 ENCOUNTER — Other Ambulatory Visit: Payer: Self-pay

## 2020-09-01 DIAGNOSIS — R102 Pelvic and perineal pain: Secondary | ICD-10-CM | POA: Diagnosis not present

## 2020-09-01 DIAGNOSIS — R109 Unspecified abdominal pain: Secondary | ICD-10-CM | POA: Insufficient documentation

## 2020-09-01 DIAGNOSIS — Z79899 Other long term (current) drug therapy: Secondary | ICD-10-CM | POA: Insufficient documentation

## 2020-09-01 DIAGNOSIS — O99513 Diseases of the respiratory system complicating pregnancy, third trimester: Secondary | ICD-10-CM | POA: Diagnosis not present

## 2020-09-01 DIAGNOSIS — J45909 Unspecified asthma, uncomplicated: Secondary | ICD-10-CM | POA: Insufficient documentation

## 2020-09-01 DIAGNOSIS — Z3A29 29 weeks gestation of pregnancy: Secondary | ICD-10-CM | POA: Diagnosis not present

## 2020-09-01 DIAGNOSIS — O10913 Unspecified pre-existing hypertension complicating pregnancy, third trimester: Secondary | ICD-10-CM | POA: Diagnosis not present

## 2020-09-01 DIAGNOSIS — O26893 Other specified pregnancy related conditions, third trimester: Secondary | ICD-10-CM | POA: Insufficient documentation

## 2020-09-01 DIAGNOSIS — I252 Old myocardial infarction: Secondary | ICD-10-CM | POA: Insufficient documentation

## 2020-09-01 DIAGNOSIS — Z8673 Personal history of transient ischemic attack (TIA), and cerebral infarction without residual deficits: Secondary | ICD-10-CM | POA: Insufficient documentation

## 2020-09-01 LAB — URINALYSIS, ROUTINE W REFLEX MICROSCOPIC
Bilirubin Urine: NEGATIVE
Glucose, UA: NEGATIVE mg/dL
Hgb urine dipstick: NEGATIVE
Ketones, ur: NEGATIVE mg/dL
Nitrite: NEGATIVE
Protein, ur: 30 mg/dL — AB
Specific Gravity, Urine: 1.031 — ABNORMAL HIGH (ref 1.005–1.030)
pH: 5 (ref 5.0–8.0)

## 2020-09-01 MED ORDER — PROMETHAZINE HCL 25 MG PO TABS
25.0000 mg | ORAL_TABLET | ORAL | Status: AC
  Administered 2020-09-01: 25 mg via ORAL
  Filled 2020-09-01: qty 1

## 2020-09-01 MED ORDER — MISC. DEVICES MISC: 0 refills | Status: DC

## 2020-09-01 MED ORDER — ACETAMINOPHEN 500 MG PO TABS
1000.0000 mg | ORAL_TABLET | ORAL | Status: AC
  Administered 2020-09-01: 1000 mg via ORAL
  Filled 2020-09-01: qty 2

## 2020-09-01 NOTE — MAU Note (Signed)
Office had called, "intense contractions'.  Pt states has been having sharp pains in abd and back for a few days, getting worse, feeling pressure. No bleeding or LOF

## 2020-09-01 NOTE — Discharge Instructions (Signed)
   PREGNANCY SUPPORT BELT: You are not alone, Seventy-five percent of women have some sort of abdominal or back pain at some point in their pregnancy. Your baby is growing at a fast pace, which means that your whole body is rapidly trying to adjust to the changes. As your uterus grows, your back may start feeling a bit under stress and this can result in back or abdominal pain that can go from mild, and therefore bearable, to severe pains that will not allow you to sit or lay down comfortably, When it comes to dealing with pregnancy-related pains and cramps, some pregnant women usually prefer natural remedies, which the market is filled with nowadays. For example, wearing a pregnancy support belt can help ease and lessen your discomfort and pain. WHAT ARE THE BENEFITS OF WEARING A PREGNANCY SUPPORT BELT? A pregnancy support belt provides support to the lower portion of the belly taking some of the weight of the growing uterus and distributing to the other parts of your body. It is designed make you comfortable and gives you extra support. Over the years, the pregnancy apparel market has been studying the needs and wants of pregnant women and they have come up with the most comfortable pregnancy support belts that woman could ever ask for. In fact, you will no longer have to wear a stretched-out or bulky pregnancy belt that is visible underneath your clothes and makes you feel even more uncomfortable. Nowadays, a pregnancy support belt is made of comfortable and stretchy materials that will not irritate your skin but will actually make you feel at ease and you will not even notice you are wearing it. They are easy to put on and adjust during the day and can be worn at night for additional support.  BENEFITS: . Relives Back pain . Relieves Abdominal Muscle and Leg Pain . Stabilizes the Pelvic Ring . Offers a Cushioned Abdominal Lift Pad . Relieves pressure on the Sciatic Nerve Within Minutes .  WHERE TO GET  YOUR PREGNANCY BELT: [employer] [phone] @[address]  [address] [place], [place]  *Your insurance may not cover a pregnancy belt. If you feel that it is too expensive from a medical supply store, you can purchase one online at [facility] or [employer], etc*

## 2020-09-01 NOTE — MAU Provider Note (Signed)
History     CSN: [id number]  Arrival date and time: 09/01/20 1413   None     Chief Complaint  Patient presents with  . Contractions  . Pelvic Pain   HPI  Patient is a 34 y/o [id number] at [redacted]w[redacted]d who presents with abdominal pain. She says that this has been a very rough pregnancy for her. She reports that this abdominal pain is not a new problem and that she has been having abdominal and back pain for the last several weeks, but that today is feeling frustrated with the discomfort. She say she has been working with her OB provider to manage her pain and has had a recent PTL workup in the office that was negative. She reports having persistent lower abdominal pain and lower back pain. It is somewhat relieved by tylenol but says that flexeril hasn't helped. She denies any vaginal discharge or pain, no dysuria or urinary frequency. Has not had intercourse since beginning of pregnancy. Denies feeling contractions. Endorses good fetal movement.    Patient does not have any upper respiratory symptoms or cardiac symptoms. She denies chest pain, shortness of breath, cough, sore throat. No fevers or chills. No recent sicknesses.    OB History    Gravida  4   Para  3   Term  3   Preterm      AB      Living  3     SAB      TAB      Ectopic      Multiple  0   Live Births  3           Past Medical History:  Diagnosis Date  . Abnormal Pap smear 2010   Repeat pap done;Hysteroscopy;Last pap 2012;was normal  . Anemia 2010   Iron supplements PP  . Asthma    Has a nebulizer;triggered by stress  . Chest pain   . Colon polyps   . Headache(784.0)   . Heart attack [facility])    MD thinks may have had a heart attack;Cardiologist @ [facility]  . History of CVA (cerebrovascular accident) 04/09/2013   Pt reports presumptive "stroke" when she was 34y.o.; followed by [facility] neurology for "chronic nerve pain" and has been Rx'd Hydrocodone and Mobic  . Hypertension   . Infection    BV;may get  frequently  . Multiple food allergies   . Nerve pain    Chronic;d/t possible stroke and MI  . Placenta previa antepartum 2010   Placenta moved 3 days before baby was born  . Pre-diabetes   . Shortness of breath   . Sickle cell trait (HCC)   . Stroke [facility]) 2013   MD thought may have had a stroke and heart attack;has neurologist @ [facility]  . Vitamin D deficiency     Past Surgical History:  Procedure Laterality Date  . HYSTEROSCOPY  2010  . LEFT HEART CATH AND CORONARY ANGIOGRAPHY N/A 02/18/2019   Procedure: LEFT HEART CATH AND CORONARY ANGIOGRAPHY;  Surgeon: [name] Lex, MD;  Location: [facility] INVASIVE CV LAB;  Service: Cardiovascular;  Laterality: N/A;    Family History  Problem Relation Age of Onset  . Sickle cell trait Father   . Hypertension Mother   . Anemia Mother   . Asthma Mother   . Diabetes Mother   . Cervical cancer Paternal Aunt   . Cervical cancer Paternal Grandmother   . Diabetes Paternal Grandmother   . Diabetes Paternal Grandfather   .  Diabetes Paternal Aunt   . Asthma Brother   . Asthma Daughter   . Asthma Son     Social History   Tobacco Use  . Smoking status: Never Smoker  . Smokeless tobacco: Never Used  Vaping Use  . Vaping Use: Never used  Substance Use Topics  . Alcohol use: Not Currently    Alcohol/week: 0.0 standard drinks    Comment: last use 03/16/2020  . Drug use: No    Allergies:  Allergies  Allergen Reactions  . Ginger Anaphylaxis    Medications Prior to Admission  Medication Sig Dispense Refill Last Dose  . acetaminophen (TYLENOL) 325 MG tablet Take 2 tablets (650 mg total) by mouth every 4 (four) hours as needed (for pain scale < 4  OR  temperature  >/=  100.5 F). 30 tablet 0 Past Week at Unknown time  . fluticasone (FLONASE) 50 MCG/ACT nasal spray Place 2 sprays into both nostrils daily.   Past Month at Unknown time  . Prenatal Vit-Fe Fumarate-FA (MULTIVITAMIN-PRENATAL) 27-0.8 MG TABS tablet Take 1 tablet by  mouth daily at 12 noon. 30 tablet 0 08/31/2020 at Unknown time    Review of Systems  Constitutional: Negative for chills, diaphoresis, fatigue and fever.  HENT: Negative for congestion, sinus pain and sore throat.   Respiratory: Negative for chest tightness and shortness of breath.   Gastrointestinal: Negative for blood in stool, constipation, diarrhea and vomiting.  Genitourinary: Negative for dysuria and flank pain.  Musculoskeletal: Positive for back pain.  Neurological: Negative for dizziness and headaches.   Physical Exam   Last menstrual period 02/19/2020, unknown if currently breastfeeding.  Physical Exam Vitals and nursing note reviewed. Exam conducted with a chaperone present.  Constitutional:      Appearance: Normal appearance.  HENT:     Head: Normocephalic and atraumatic.  Cardiovascular:     Rate and Rhythm: Normal rate and regular rhythm.  Pulmonary:     Effort: Pulmonary effort is normal.  Abdominal:     Palpations: Abdomen is soft.     Comments: Soft, mildly tender to palpation of RLQ LLQ without rebound or guarding   Genitourinary:    Comments: SVE Closed/Thick/High no CMT Musculoskeletal:     Comments: Back is nontender to palpation b/l. No flank pain.   Neurological:     Mental Status: She is alert.  Psychiatric:        Mood and Affect: Mood normal.        Behavior: Behavior normal.     MAU Course  Procedures  MDM Evaluated at bedside, SVE Closed/thick/high  NST Reactive - baseline 125, mod variability, pos accels, neg decels  Toco: Not contracting  Phenergan and Tylenol administered Patient reports feeling improved   Assessment and Plan   Abdominal Pain in Pregnancy -discussed obtaining maternity belt  -continue tylenol, flexeril prn, other non pharmacologic measures including heat, baths, stretching, etc  -patient stable for d/c from MAU    Gita Kudo 09/01/2020, 2:53 PM

## 2020-09-19 DIAGNOSIS — Z3A32 32 weeks gestation of pregnancy: Secondary | ICD-10-CM | POA: Diagnosis not present

## 2020-09-19 DIAGNOSIS — O10019 Pre-existing essential hypertension complicating pregnancy, unspecified trimester: Secondary | ICD-10-CM | POA: Diagnosis not present

## 2020-10-12 ENCOUNTER — Other Ambulatory Visit: Payer: Self-pay

## 2020-10-12 ENCOUNTER — Encounter (HOSPITAL_COMMUNITY): Payer: Self-pay | Admitting: Obstetrics and Gynecology

## 2020-10-12 ENCOUNTER — Inpatient Hospital Stay (HOSPITAL_COMMUNITY)
Admission: AD | Admit: 2020-10-12 | Discharge: 2020-10-13 | DRG: 832 | Disposition: A | Discharge status: Home / Self Care | Payer: BC Managed Care – PPO | Attending: Obstetrics & Gynecology | Admitting: Obstetrics & Gynecology

## 2020-10-12 DIAGNOSIS — Z3A35 35 weeks gestation of pregnancy: Secondary | ICD-10-CM | POA: Diagnosis not present

## 2020-10-12 DIAGNOSIS — Z8673 Personal history of transient ischemic attack (TIA), and cerebral infarction without residual deficits: Secondary | ICD-10-CM

## 2020-10-12 DIAGNOSIS — Z3689 Encounter for other specified antenatal screening: Secondary | ICD-10-CM | POA: Diagnosis not present

## 2020-10-12 DIAGNOSIS — Z8049 Family history of malignant neoplasm of other genital organs: Secondary | ICD-10-CM

## 2020-10-12 DIAGNOSIS — Z832 Family history of diseases of the blood and blood-forming organs and certain disorders involving the immune mechanism: Secondary | ICD-10-CM

## 2020-10-12 DIAGNOSIS — O163 Unspecified maternal hypertension, third trimester: Secondary | ICD-10-CM | POA: Diagnosis present

## 2020-10-12 DIAGNOSIS — O99513 Diseases of the respiratory system complicating pregnancy, third trimester: Secondary | ICD-10-CM | POA: Diagnosis present

## 2020-10-12 DIAGNOSIS — D573 Sickle-cell trait: Secondary | ICD-10-CM | POA: Diagnosis present

## 2020-10-12 DIAGNOSIS — I252 Old myocardial infarction: Secondary | ICD-10-CM | POA: Diagnosis not present

## 2020-10-12 DIAGNOSIS — O4700 False labor before 37 completed weeks of gestation, unspecified trimester: Secondary | ICD-10-CM

## 2020-10-12 DIAGNOSIS — J4550 Severe persistent asthma, uncomplicated: Secondary | ICD-10-CM | POA: Diagnosis not present

## 2020-10-12 DIAGNOSIS — O99013 Anemia complicating pregnancy, third trimester: Secondary | ICD-10-CM | POA: Diagnosis not present

## 2020-10-12 DIAGNOSIS — Z20822 Contact with and (suspected) exposure to covid-19: Secondary | ICD-10-CM | POA: Diagnosis not present

## 2020-10-12 DIAGNOSIS — Z833 Family history of diabetes mellitus: Secondary | ICD-10-CM | POA: Diagnosis not present

## 2020-10-12 DIAGNOSIS — O368130 Decreased fetal movements, third trimester, not applicable or unspecified: Secondary | ICD-10-CM | POA: Diagnosis not present

## 2020-10-12 DIAGNOSIS — O4703 False labor before 37 completed weeks of gestation, third trimester: Secondary | ICD-10-CM | POA: Diagnosis not present

## 2020-10-12 DIAGNOSIS — O368330 Maternal care for abnormalities of the fetal heart rate or rhythm, third trimester, not applicable or unspecified: Secondary | ICD-10-CM | POA: Diagnosis not present

## 2020-10-12 DIAGNOSIS — Z825 Family history of asthma and other chronic lower respiratory diseases: Secondary | ICD-10-CM | POA: Diagnosis not present

## 2020-10-12 DIAGNOSIS — Z8249 Family history of ischemic heart disease and other diseases of the circulatory system: Secondary | ICD-10-CM | POA: Diagnosis not present

## 2020-10-12 DIAGNOSIS — O10913 Unspecified pre-existing hypertension complicating pregnancy, third trimester: Secondary | ICD-10-CM | POA: Diagnosis not present

## 2020-10-12 LAB — RESPIRATORY PANEL BY RT PCR (FLU A&B, COVID)
Influenza A by PCR: NEGATIVE
Influenza B by PCR: NEGATIVE
SARS Coronavirus 2 by RT PCR: NEGATIVE

## 2020-10-12 LAB — AMNISURE RUPTURE OF MEMBRANE (ROM) NOT AT ARMC: Amnisure ROM: NEGATIVE

## 2020-10-12 LAB — CBC
HCT: 35.6 % — ABNORMAL LOW (ref 36.0–46.0)
Hemoglobin: 10.7 g/dL — ABNORMAL LOW (ref 12.0–15.0)
MCH: 24.4 pg — ABNORMAL LOW (ref 26.0–34.0)
MCHC: 30.1 g/dL (ref 30.0–36.0)
MCV: 81.1 fL (ref 80.0–100.0)
Platelets: 221 10*3/uL (ref 150–400)
RBC: 4.39 MIL/uL (ref 3.87–5.11)
RDW: 15 % (ref 11.5–15.5)
WBC: 8.3 10*3/uL (ref 4.0–10.5)
nRBC: 0 % (ref 0.0–0.2)

## 2020-10-12 LAB — COMPREHENSIVE METABOLIC PANEL
ALT: 11 U/L (ref 0–44)
AST: 18 U/L (ref 15–41)
Albumin: 3.4 g/dL — ABNORMAL LOW (ref 3.5–5.0)
Alkaline Phosphatase: 109 U/L (ref 38–126)
Anion gap: 13 (ref 5–15)
BUN: 6 mg/dL (ref 6–20)
CO2: 17 mmol/L — ABNORMAL LOW (ref 22–32)
Calcium: 9.2 mg/dL (ref 8.9–10.3)
Chloride: 105 mmol/L (ref 98–111)
Creatinine, Ser: 0.65 mg/dL (ref 0.44–1.00)
GFR, Estimated: >60 mL/min (ref >60)
Glucose, Bld: 81 mg/dL (ref 70–99)
Potassium: 3.7 mmol/L (ref 3.5–5.1)
Sodium: 135 mmol/L (ref 135–145)
Total Bilirubin: 1.2 mg/dL (ref 0.3–1.2)
Total Protein: 7.7 g/dL (ref 6.5–8.1)

## 2020-10-12 LAB — WET PREP, GENITAL
Clue Cells Wet Prep HPF POC: NONE SEEN
Sperm: NONE SEEN
Trich, Wet Prep: NONE SEEN
Yeast Wet Prep HPF POC: NONE SEEN

## 2020-10-12 LAB — TYPE AND SCREEN
ABO/RH(D): O POS
Antibody Screen: NEGATIVE

## 2020-10-12 MED ORDER — LACTATED RINGERS IV SOLN: 500.0000 mL | INTRAVENOUS | Status: DC | PRN

## 2020-10-12 MED ORDER — BETAMETHASONE SOD PHOS & ACET 6 (3-3) MG/ML IJ SUSP
12.0000 mg | INTRAMUSCULAR | Status: DC
  Administered 2020-10-12: 12 mg via INTRAMUSCULAR
  Filled 2020-10-12: qty 5

## 2020-10-12 MED ORDER — ACETAMINOPHEN 325 MG PO TABS
650.0000 mg | ORAL_TABLET | ORAL | Status: DC | PRN
  Administered 2020-10-13: 650 mg via ORAL
  Filled 2020-10-12: qty 2

## 2020-10-12 MED ORDER — LACTATED RINGERS IV SOLN: INTRAVENOUS | Status: DC

## 2020-10-12 MED ORDER — SOD CITRATE-CITRIC ACID 500-334 MG/5ML PO SOLN: 30.0000 mL | ORAL | Status: DC | PRN

## 2020-10-12 MED ORDER — FENTANYL CITRATE (PF) 100 MCG/2ML IJ SOLN: 50.0000 ug | INTRAMUSCULAR | Status: DC | PRN

## 2020-10-12 MED ORDER — OXYTOCIN-SODIUM CHLORIDE 30-0.9 UT/500ML-% IV SOLN: 2.5000 [IU]/h | INTRAVENOUS | Status: DC

## 2020-10-12 MED ORDER — OXYCODONE-ACETAMINOPHEN 5-325 MG PO TABS: 1.0000 | ORAL_TABLET | ORAL | Status: DC | PRN

## 2020-10-12 MED ORDER — NIFEDIPINE 10 MG PO CAPS
10.0000 mg | ORAL_CAPSULE | ORAL | Status: AC
  Administered 2020-10-12 (×3): 10 mg via ORAL
  Filled 2020-10-12 (×3): qty 1

## 2020-10-12 MED ORDER — ONDANSETRON HCL 4 MG/2ML IJ SOLN: 4.0000 mg | Freq: Four times a day (QID) | INTRAMUSCULAR | Status: DC | PRN

## 2020-10-12 MED ORDER — OXYCODONE-ACETAMINOPHEN 5-325 MG PO TABS: 2.0000 | ORAL_TABLET | ORAL | Status: DC | PRN

## 2020-10-12 MED ORDER — LIDOCAINE HCL (PF) 1 % IJ SOLN: 30.0000 mL | INTRAMUSCULAR | Status: DC | PRN

## 2020-10-12 MED ORDER — OXYTOCIN BOLUS FROM INFUSION: 333.0000 mL | Freq: Once | INTRAVENOUS | Status: DC

## 2020-10-12 NOTE — MAU Provider Note (Signed)
History     CSN: [id number]  Arrival date and time: 10/12/20 1541   First Provider Initiated Contact with Patient 10/12/20 1710      Chief Complaint  Patient presents with   Contractions   Ms. Melissa [name] is a 34 y.o. year old [id number] female at [redacted]w[redacted]d weeks gestation who presents to MAU reporting contractions for 3-4 days every 10-15 minutes and "feeling wet all day; no gushes." Melissa [name] called [facility] office and was advised to come to MAU for evaluation. Melissa [name] hx is significant for MI in 2020, CVA in 2011 and 2014 (no HTN meds - cardiology and pulmonology teams will be present for delivery). Melissa [name] OB hx is 1st = previa, 2nd = preeclampsia and 3rd = IOL. Melissa [name] was last seen at [facility] last week; next appt is scheduled for 10/21/2020.   OB History    Gravida  4   Para  3   Term  3   Preterm      AB      Living  3     SAB      TAB      Ectopic      Multiple  0   Live Births  3           Past Medical History:  Diagnosis Date   Abnormal Pap smear 2010   Repeat pap done;Hysteroscopy;Last pap 2012;was normal   Anemia 2010   Iron supplements PP   Asthma    Has a nebulizer;triggered by stress   Chest pain    Colon polyps    Headache(784.0)    Heart attack [facility])    MD thinks may have had a heart attack;Cardiologist @ [facility]   History of CVA (cerebrovascular accident) 04/09/2013   Pt reports presumptive "stroke" when Melissa [name] was 34y.o.; followed by [facility] neurology for "chronic nerve pain" and has been Rx'd Hydrocodone and Mobic   Hypertension    Infection    BV;may get frequently, UTI   Multiple food allergies    Nerve pain    Chronic;d/t possible stroke and MI   Placenta previa antepartum 2010   Placenta moved 3 days before baby was born   Pre-diabetes    Shortness of breath    Sickle cell trait (HCC)    Stroke [facility]) 2013   MD thought may have had a stroke and heart attack;has neurologist @ [facility]   Vitamin D deficiency     Past  Surgical History:  Procedure Laterality Date   HYSTEROSCOPY  2010   LEFT HEART CATH AND CORONARY ANGIOGRAPHY N/A 02/18/2019   Procedure: LEFT HEART CATH AND CORONARY ANGIOGRAPHY;  Surgeon: [name] Lex, MD;  Location: [facility] INVASIVE CV LAB;  Service: Cardiovascular;  Laterality: N/A;    Family History  Problem Relation Age of Onset   Sickle cell trait Father    Hypertension Mother    Anemia Mother    Asthma Mother    Diabetes Mother    Cervical cancer Paternal Aunt    Cervical cancer Paternal Grandmother    Diabetes Paternal Grandmother    Diabetes Paternal Grandfather    Diabetes Paternal Aunt    Asthma Brother    Asthma Daughter    Asthma Son     Social History   Tobacco Use   Smoking status: Never Smoker   Smokeless tobacco: Never Used  Building services engineer Use: Never used  Substance Use Topics   Alcohol use: Not Currently    Alcohol/week: 0.0  standard drinks    Comment: last use 03/16/2020   Drug use: No    Allergies:  Allergies  Allergen Reactions   Ginger Anaphylaxis    Medications Prior to Admission  Medication Sig Dispense Refill Last Dose   acetaminophen (TYLENOL) 325 MG tablet Take 2 tablets (650 mg total) by mouth every 4 (four) hours as needed (for pain scale < 4  OR  temperature  >/=  100.5 F). 30 tablet 0    fluticasone (FLONASE) 50 MCG/ACT nasal spray Place 2 sprays into both nostrils daily.      Misc. Devices MISC Dispense one maternity belt for patient 1 each 0    Prenatal Vit-Fe Fumarate-FA (MULTIVITAMIN-PRENATAL) 27-0.8 MG TABS tablet Take 1 tablet by mouth daily at 12 noon. 30 tablet 0     Review of Systems  Constitutional: Negative.   HENT: Negative.   Eyes: Negative.   Respiratory: Negative.   Cardiovascular: Negative.   Gastrointestinal: Negative.   Endocrine: Negative.   Genitourinary: Positive for pelvic pain (contractions that radiate from lower back to lower abdomen then back to lower back again) and vaginal  discharge ("feeling wet all day").  Musculoskeletal: Positive for back pain.  Skin: Negative.   Allergic/Immunologic: Negative.   Neurological: Negative.   Hematological: Negative.   Psychiatric/Behavioral: Negative.    Physical Exam   Blood pressure 121/83, pulse 99, temperature 98.3 F (36.8 C), temperature source Oral, resp. rate 18, last menstrual period 02/19/2020, SpO2 98 %, unknown if currently breastfeeding.  Physical Exam Vitals and nursing note reviewed. Exam conducted with a chaperone present.  Constitutional:      Appearance: Normal appearance. Melissa [name] is normal weight.  HENT:     Head: Normocephalic and atraumatic.  Cardiovascular:     Rate and Rhythm: Normal rate.     Pulses: Normal pulses.  Pulmonary:     Effort: Pulmonary effort is normal.  Genitourinary:    Comments: Uterus: gravid, S=D, SE: cervix is smooth, pink, no lesions, moderate amt of thin, white vaginal d/c -- WP done, no CMT or friability, no adnexal tenderness   Dilation: 1.5 Effacement (%): 60 Cervical Position: Middle Station: -3 Presentation: Vertex Exam by: Carloyn Jaeger, CNM   Musculoskeletal:     Cervical back: Normal range of motion.  Neurological:     Mental Status: Melissa [name] is alert.    REACTIVE NST - FHR: 140 bpm / moderate variability / accels present / decels absent / TOCO: regular every 3-5 mins   Reassessment @ 1900:  Dilation: 2 Effacement (%): 60 Cervical Position: Middle Station: -3 Presentation: Vertex Exam by:: Carloyn Jaeger, CNM   Reassessment @ 2050: Dilation: 2.5 Effacement (%): 60 Cervical Position: Middle Station: -3, Ballotable Presentation: Vertex Exam by:: Carloyn Jaeger, CNM   MAU Course  Procedures  MDM Amnisure Wet Prep Procardia 10 mg every 20 minutes x 3 doses  Results for orders placed or performed during the hospital encounter of 10/12/20 (from the past 24 hour(s))  Amnisure rupture of membrane (rom)not at [facility]     Status: None   Collection Time: 10/12/20  5:39  PM  Result Value Ref Range   Amnisure ROM NEGATIVE   Wet prep, genital     Status: Abnormal   Collection Time: 10/12/20  5:39 PM  Result Value Ref Range   Yeast Wet Prep HPF POC NONE SEEN NONE SEEN   Trich, Wet Prep NONE SEEN NONE SEEN   Clue Cells Wet Prep HPF POC NONE SEEN NONE SEEN  WBC, Wet Prep HPF POC MANY (A) NONE SEEN   Sperm NONE SEEN     *Consult with Dr. Dion Body @ 2050 - notified of patient's complaints, assessments, lab & NST results, tx plan admission - [facility], CNM and Dr. Dion Body will assume the care of the patient and is responsible to call NICU to notify of admission  Assessment and Plan  Preterm contractions - Admit to L&D - Orders to be placed by [facility], CNM - See [facility], CNM's H&P documentation    Enrique Weiss,MSN, CNM 10/12/2020, 5:10 PM

## 2020-10-12 NOTE — H&P (Signed)
Melissa [name] is a 34 y.o. female, G4P3003, IUP at 35.3 weeks, presenting for preterm labor. Pt endorse + Fm. Denies vaginal leakage. Denies vaginal bleeding.    Prenatal Medical Problem:  Anemia of pregnancy (Hgb 10.4 at 27 weeks, rx'd Fe) Asthma (On Pulmocort and Dulera) Atypical squamous cells of undetermined significance (Repeat 1 year) Benign essential hypertension (Hx of taking Labetalol, amlodipine. Medication stopped about 6 monts before current pregnancy as her blood pressures normalized) Cardiac catheterization (Cardiology: Dr. Tenny Craw of [facility]. Catheterization done for chest pain and EKG findings in Feb 2020. Catheterization was normal) Chronic pain (Hx of pain seen at pain clinic) Decreased vitamin K (Vit d 04/25/20: 10.8, advised usage of 4000 iu Po vitamin d daily) Genital herpes simplex (Type 2 by titer, no prodromal s/sx, no lesion, not on valtrex) History of cerebrovascular accident without residual deficits (? CVA age 44 in 2011) History of pre-eclampsia (10 days pp after second pregnancy in 2014 had preeclampsia. Per patient she also had a stroke then, review of her records shows her MRI was negative) Postpartum depression (Hx therapy after last delivery 2015, no meds currently) Hyperemesis gravidarum (Controlled by steroids.) Alcohol intoxication (In first trimester before finding out she was pregnant)  Patient Active Problem List   Diagnosis Date Noted   [redacted] weeks gestation of pregnancy 07/27/2020   Abdominal trauma 07/26/2020   Hyperemesis gravidarum with dehydration 05/16/2020   Chronic pain 03/05/2019   Acute pericarditis 02/18/2019   Asthma exacerbation 02/07/2019   Acute hypoxemic respiratory failure (HCC) 02/07/2019   Mild persistent chronic asthma without complication 09/22/2015   Laryngopharyngeal reflux (LPR)    Physical deconditioning    Chronic hypertension complicating or reason for care during pregnancy 11/26/2014   Chronic pain syndrome  11/26/2014   Obesity (BMI 30-39.9) 11/26/2014   Essential hypertension 10/19/2013   History of CVA (cerebrovascular accident) 04/09/2013   Sickle cell trait (HCC) 02/16/2013   Severe persistent asthma with allergic rhinitis with status asthmaticus 02/16/2013   Migraines 02/16/2013     Medications Prior to Admission  Medication Sig Dispense Refill Last Dose   acetaminophen (TYLENOL) 325 MG tablet Take 2 tablets (650 mg total) by mouth every 4 (four) hours as needed (for pain scale < 4  OR  temperature  >/=  100.5 F). 30 tablet 0    fluticasone (FLONASE) 50 MCG/ACT nasal spray Place 2 sprays into both nostrils daily.      Misc. Devices MISC Dispense one maternity belt for patient 1 each 0    Prenatal Vit-Fe Fumarate-FA (MULTIVITAMIN-PRENATAL) 27-0.8 MG TABS tablet Take 1 tablet by mouth daily at 12 noon. 30 tablet 0     Past Medical History:  Diagnosis Date   Abnormal Pap smear 2010   Repeat pap done;Hysteroscopy;Last pap 2012;was normal   Anemia 2010   Iron supplements PP   Asthma    Has a nebulizer;triggered by stress   Chest pain    Colon polyps    Headache(784.0)    Heart attack [facility])    MD thinks may have had a heart attack;Cardiologist @ [facility]   History of CVA (cerebrovascular accident) 04/09/2013   Pt reports presumptive "stroke" when she was 34y.o.; followed by [facility] neurology for "chronic nerve pain" and has been Rx'd Hydrocodone and Mobic   Hypertension    Infection    BV;may get frequently, UTI   Multiple food allergies    Nerve pain    Chronic;d/t possible stroke and MI   Placenta previa antepartum  2010   Placenta moved 3 days before baby was born   Pre-diabetes    Shortness of breath    Sickle cell trait (HCC)    Stroke [facility]) 2013   MD thought may have had a stroke and heart attack;has neurologist @ [facility]   Vitamin D deficiency      No current facility-administered medications on file prior to encounter.    Current Outpatient Medications on File Prior to Encounter  Medication Sig Dispense Refill   acetaminophen (TYLENOL) 325 MG tablet Take 2 tablets (650 mg total) by mouth every 4 (four) hours as needed (for pain scale < 4  OR  temperature  >/=  100.5 F). 30 tablet 0   fluticasone (FLONASE) 50 MCG/ACT nasal spray Place 2 sprays into both nostrils daily.     Misc. Devices MISC Dispense one maternity belt for patient 1 each 0   Prenatal Vit-Fe Fumarate-FA (MULTIVITAMIN-PRENATAL) 27-0.8 MG TABS tablet Take 1 tablet by mouth daily at 12 noon. 30 tablet 0     Allergies  Allergen Reactions   Ginger Anaphylaxis    History of present pregnancy: Pt Info/Preference:  Screening/Consents:  Labs:   EDD: Estimated Date of Delivery: 11/13/20  Establised: Patient's last menstrual period was 02/19/2020.  Anatomy Scan: Date: 07/19/2020 Placenta Location: [facility] Genetic Screen: Panoroma:Declined AFP:  First Tri: Quad:  Office: [facility]             First PNV: 14.2 wg Blood Type --/--/O POS (07/27 2020)  Language: english Last PNV: 35.3 wg Rhogam  N/A  Flu Vaccine:  Declined   Antibody NEG (07/27 2020)  TDaP vaccine UTD   GTT: Early: 5.3 hga1c Third Trimester: 86  Feeding Plan: Breast/bottle BTL: no Rubella:  Immune  Contraception: ??? VBAC: no RPR:   NR  Circumcision: ???   HBsAg:  NEG  Pediatrician:  ???   HIV:   NR  Prenatal Classes: no Additional [place]: 9/20 growth 4.2, anterior placenta, BPP 8/8 GBS:  Unknown(For PCN allergy, check sensitivities)       Chlamydia: neg    MFM Referral/Consult:  GC: neg  Support Person: partner   PAP: 2021 ASUCS  Pain Management: epidural Neonatologist Referral:  Hgb Electrophoresis:  AA  Birth Plan: DCC   Hgb NOB: 13    28W: 10.4  9/20 Growth scan:  OB History    Gravida  4   Para  3   Term  3   Preterm      AB      Living  3     SAB      TAB      Ectopic      Multiple  0   Live Births  3          Past Medical History:  Diagnosis Date    Abnormal Pap smear 2010   Repeat pap done;Hysteroscopy;Last pap 2012;was normal   Anemia 2010   Iron supplements PP   Asthma    Has a nebulizer;triggered by stress   Chest pain    Colon polyps    Headache(784.0)    Heart attack [facility])    MD thinks may have had a heart attack;Cardiologist @ [facility]   History of CVA (cerebrovascular accident) 04/09/2013   Pt reports presumptive "stroke" when she was 34y.o.; followed by [facility] neurology for "chronic nerve pain" and has been Rx'd Hydrocodone and Mobic   Hypertension    Infection    BV;may get frequently, UTI   Multiple  food allergies    Nerve pain    Chronic;d/t possible stroke and MI   Placenta previa antepartum 2010   Placenta moved 3 days before baby was born   Pre-diabetes    Shortness of breath    Sickle cell trait (HCC)    Stroke [facility]) 2013   MD thought may have had a stroke and heart attack;has neurologist @ [facility]   Vitamin D deficiency    Past Surgical History:  Procedure Laterality Date   HYSTEROSCOPY  2010   LEFT HEART CATH AND CORONARY ANGIOGRAPHY N/A 02/18/2019   Procedure: LEFT HEART CATH AND CORONARY ANGIOGRAPHY;  Surgeon: [name] Lex, MD;  Location: [facility] INVASIVE CV LAB;  Service: Cardiovascular;  Laterality: N/A;   Family History: family history includes Anemia in her mother; Asthma in her brother, daughter, mother, and son; Cervical cancer in her paternal aunt and paternal grandmother; Diabetes in her mother, paternal aunt, paternal grandfather, and paternal grandmother; Hypertension in her mother; Sickle cell trait in her father. Social History:  reports that she has never smoked. She has never used smokeless tobacco. She reports previous alcohol use. She reports that she does not use drugs.   Prenatal Transfer Tool  Maternal Diabetes: No Genetic Screening: Normal Maternal Ultrasounds/Referrals: Normal Fetal Ultrasounds or other Referrals:  None Maternal Substance Abuse:   No Alcohol prior to knowing she was pregnant.  Significant Maternal Medications:  None dulera, Pulmicort, steriods during pregnancy Significant Maternal Lab Results: Other: Unknown.   ROS:  Review of Systems  Constitutional: Negative.   HENT: Negative.   Eyes: Negative.   Respiratory: Negative.   Cardiovascular: Negative.   Gastrointestinal: Positive for abdominal pain.  Genitourinary: Negative.   Musculoskeletal: Negative.   Skin: Negative.   Neurological: Negative.   Endo/Heme/Allergies: Negative.   Psychiatric/Behavioral: Negative.      Physical Exam: BP 111/69    Pulse 99    Temp 98.3 F (36.8 C) (Oral)    Resp 18    LMP 02/19/2020    SpO2 98%   Physical Exam Vitals and nursing note reviewed. Exam conducted with a chaperone present.  Constitutional:      Appearance: Normal appearance.  HENT:     Head: Normocephalic and atraumatic.     Nose: Nose normal.     Mouth/Throat:     Mouth: Mucous membranes are moist.  Eyes:     Conjunctiva/sclera: Conjunctivae normal.     Pupils: Pupils are equal, round, and reactive to light.  Cardiovascular:     Rate and Rhythm: Normal rate and regular rhythm.     Pulses: Normal pulses.     Heart sounds: Normal heart sounds.  Pulmonary:     Effort: Pulmonary effort is normal.     Breath sounds: Normal breath sounds.  Abdominal:     General: Bowel sounds are normal.  Genitourinary:    General: Normal vulva.     Rectum: Normal.     Comments: No lesion noted. Pelvis adaqaute Musculoskeletal:        General: Normal range of motion.     Cervical back: Normal range of motion and neck supple.  Skin:    General: Skin is warm.     Capillary Refill: Capillary refill takes less than 2 seconds.  Neurological:     General: No focal deficit present.     Mental Status: She is alert.  Psychiatric:        Mood and Affect: Mood normal.     MAU GU  PE: Genitourinary: Comments: Uterus: gravid, S=D, SE: cervix is smooth, pink, no lesions,  moderate amt of thin, white vaginal d/c -- WP done, no CMT or friability, no adnexal tenderness   Dilation: 1.5 Effacement (%): 60 Cervical Position: Middle Station: -3 Presentation: Vertex Exam by: Carloyn Jaeger, CNM   NST: FHR baseline 130 bpm, Variability: moderate, Accelerations:present, Decelerations:  Absent= Cat 1/Reactive UC:   none SVE:   Dilation: 2.5 Effacement (%): 60 Station: -3, Ballotable Exam by:: Carloyn Jaeger, CNM, vertex verified by fetal sutures.  Leopold's: Position vertex, EFW 5lbs via leopold's.  Pelvis proven to 8.10lbs  Labs: Results for orders placed or performed during the hospital encounter of 10/12/20 (from the past 24 hour(s))  Amnisure rupture of membrane (rom)not at [facility]     Status: None   Collection Time: 10/12/20  5:39 PM  Result Value Ref Range   Amnisure ROM NEGATIVE   Wet prep, genital     Status: Abnormal   Collection Time: 10/12/20  5:39 PM  Result Value Ref Range   Yeast Wet Prep HPF POC NONE SEEN NONE SEEN   Trich, Wet Prep NONE SEEN NONE SEEN   Clue Cells Wet Prep HPF POC NONE SEEN NONE SEEN   WBC, Wet Prep HPF POC MANY (A) NONE SEEN   Sperm NONE SEEN     Imaging:  No results found.  MAU Course: Orders Placed This Encounter  Procedures   Wet prep, genital   Group B strep by PCR   Amnisure rupture of membrane (rom)not at [facility]   Cervical Exam   Meds ordered this encounter  Medications   NIFEdipine (PROCARDIA) capsule 10 mg   betamethasone acetate-betamethasone sodium phosphate (CELESTONE) injection 12 mg    Assessment/Plan: Jalyssa A Luellen is a 34 y.o. female, G4P3003, IUP at 35.3 weeks, presenting for preterm labor. Pt endorse + Fm. Denies vaginal leakage. Denies vaginal bleeding.  MAU fern neg, amnisure neg, procardia 10mg  every 20 min x3 doses given with cervical change from 1.5cm to 2.5 with back pain and cxt becoming more consistent. Instructed to admit pt.   Prenatal Medical Problem:  Anemia of pregnancy (Hgb 10.4 at 27  weeks, rx'd Fe) Asthma (On Pulmocort and Dulera) Atypical squamous cells of undetermined significance (Repeat 1 year) Benign essential hypertension (Hx of taking Labetalol, amlodipine. Medication stopped about 6 monts before current pregnancy as her blood pressures normalized) Cardiac catheterization (Cardiology: Dr. of [facility]. Catheterization done for chest pain and EKG findings in Feb 2020. Catheterization was normal) Chronic pain (Hx of pain seen at pain clinic) Decreased vitamin K (Vit d 04/25/20: 10.8, advised usage of 4000 iu Po vitamin d daily) Genital herpes simplex (Type 2 by titer, no prodromal s/sx, no lesion, not on valtrex) History of cerebrovascular accident without residual deficits (? CVA age 35 in 2011) History of pre-eclampsia (10 days pp after second pregnancy in 2014 had preeclampsia. Per patient she also had a stroke then, review of her records shows her MRI was negative) Postpartum depression (Hx therapy after last delivery 2015, no meds currently) Hyperemesis gravidarum (Controlled by steroids.) Alcohol intoxication (In first trimester before finding out she was pregnant)  FWB: Cat 1 Fetal Tracing.   Plan: Admit to Birthing Suite per consult with DR 2016 Routine [facility] orders Pain med/epidural prn Expectant management.  GBS, GC/C unknown pending.  Anticipate labor progression  BMZ started H/O PreE and HTN/CVA: Pending cmp and cbc, monitor BP. H/O PPD: Monitor Mood, recommend zoloft PP   Anjulie Dipierro  Ileanna Gemmill NP-C, CNM, MSN 10/12/2020, 9:17 PM

## 2020-10-12 NOTE — MAU Note (Signed)
Patient has been contracting for 3 days, denies ROM, VB. Patient states her contractions are 10-15 minutes apart, called the office and was informed to come for further evaluation.

## 2020-10-13 ENCOUNTER — Encounter (HOSPITAL_COMMUNITY): Payer: Self-pay | Admitting: Obstetrics and Gynecology

## 2020-10-13 ENCOUNTER — Other Ambulatory Visit: Payer: Self-pay

## 2020-10-13 ENCOUNTER — Inpatient Hospital Stay (HOSPITAL_COMMUNITY)
Admission: AD | Admit: 2020-10-13 | Discharge: 2020-10-14 | Disposition: A | Discharge status: Home / Self Care | Payer: BC Managed Care – PPO | Attending: Obstetrics & Gynecology | Admitting: Obstetrics & Gynecology

## 2020-10-13 ENCOUNTER — Encounter (HOSPITAL_COMMUNITY): Payer: Self-pay | Admitting: Obstetrics & Gynecology

## 2020-10-13 DIAGNOSIS — Z832 Family history of diseases of the blood and blood-forming organs and certain disorders involving the immune mechanism: Secondary | ICD-10-CM | POA: Insufficient documentation

## 2020-10-13 DIAGNOSIS — Z3689 Encounter for other specified antenatal screening: Secondary | ICD-10-CM | POA: Diagnosis not present

## 2020-10-13 DIAGNOSIS — Z833 Family history of diabetes mellitus: Secondary | ICD-10-CM | POA: Diagnosis not present

## 2020-10-13 DIAGNOSIS — O99013 Anemia complicating pregnancy, third trimester: Secondary | ICD-10-CM | POA: Insufficient documentation

## 2020-10-13 DIAGNOSIS — O368130 Decreased fetal movements, third trimester, not applicable or unspecified: Secondary | ICD-10-CM

## 2020-10-13 DIAGNOSIS — Z8249 Family history of ischemic heart disease and other diseases of the circulatory system: Secondary | ICD-10-CM | POA: Diagnosis not present

## 2020-10-13 DIAGNOSIS — D573 Sickle-cell trait: Secondary | ICD-10-CM | POA: Insufficient documentation

## 2020-10-13 DIAGNOSIS — O4703 False labor before 37 completed weeks of gestation, third trimester: Secondary | ICD-10-CM | POA: Diagnosis not present

## 2020-10-13 DIAGNOSIS — Z8673 Personal history of transient ischemic attack (TIA), and cerebral infarction without residual deficits: Secondary | ICD-10-CM | POA: Insufficient documentation

## 2020-10-13 DIAGNOSIS — Z3A35 35 weeks gestation of pregnancy: Secondary | ICD-10-CM | POA: Diagnosis not present

## 2020-10-13 DIAGNOSIS — O10913 Unspecified pre-existing hypertension complicating pregnancy, third trimester: Secondary | ICD-10-CM | POA: Diagnosis not present

## 2020-10-13 LAB — GC/CHLAMYDIA PROBE AMP (~~LOC~~) NOT AT ARMC
Chlamydia: NEGATIVE
Comment: NEGATIVE
Comment: NORMAL
Neisseria Gonorrhea: NEGATIVE

## 2020-10-13 LAB — GROUP B STREP BY PCR: Group B strep by PCR: NEGATIVE

## 2020-10-13 LAB — RPR: RPR Ser Ql: NONREACTIVE

## 2020-10-13 MED ORDER — DIPHENHYDRAMINE HCL 50 MG/ML IJ SOLN
12.5000 mg | Freq: Once | INTRAMUSCULAR | Status: AC
  Administered 2020-10-13: 12.5 mg via INTRAVENOUS
  Filled 2020-10-13: qty 1

## 2020-10-13 MED ORDER — BETAMETHASONE SOD PHOS & ACET 6 (3-3) MG/ML IJ SUSP
12.0000 mg | Freq: Once | INTRAMUSCULAR | Status: AC
  Administered 2020-10-13: 12 mg via INTRAMUSCULAR
  Filled 2020-10-13: qty 5

## 2020-10-13 MED ORDER — CYCLOBENZAPRINE HCL 5 MG PO TABS
5.0000 mg | ORAL_TABLET | ORAL | Status: AC
  Administered 2020-10-13: 5 mg via ORAL
  Filled 2020-10-13: qty 1

## 2020-10-13 NOTE — Discharge Summary (Signed)
Physician Discharge Summary  Patient ID: Melissa [name] MRN: [id number] DOB/AGE: [birth year] 34 y.o.  Admit date: 10/12/2020 Discharge date: 10/13/2020  Admission Diagnoses:  Discharge Diagnoses:  Active Problems:   Preterm labor in third trimester   Discharged Condition: good  Hospital Course: IV hydration, continuous EFM  Consults: None  Significant Diagnostic Studies: labs: CBC  Treatments: IV hydration  Discharge Exam: Blood pressure (!) 95/55, pulse 82, temperature 98 F (36.7 C), temperature source Oral, resp. rate 15, last menstrual period 02/19/2020, SpO2 98 %, unknown if currently breastfeeding. General appearance: alert, cooperative, and no distress Resp: clear to auscultation bilaterally Cardio: regular rate and rhythm, S1, S2 normal, no murmur, click, rub or gallop GI: soft, non-tender; bowel sounds normal; no masses,  no organomegaly Pelvic: external genitalia normal, vagina normal without discharge, and   Extremities: negative for pain, tenderness, edema, cords Dilation: 2 Effacement (%): Thick Cervical Position: Posterior Station: Ballotable Presentation: Vertex Exam by:: Lizbeth Bark CNM   Disposition:  There are no questions and answers to display.          Allergies as of 10/13/2020       Reactions   Ginger Anaphylaxis        Medication List     TAKE these medications    acetaminophen 325 MG tablet Commonly known as: TYLENOL Take 2 tablets (650 mg total) by mouth every 4 (four) hours as needed (for pain scale < 4  OR  temperature  >/=  100.5 F).   fluticasone 50 MCG/ACT nasal spray Commonly known as: FLONASE Place 2 sprays into both nostrils daily.   Misc. Devices Misc Dispense one maternity belt for patient   multivitamin-prenatal 27-0.8 MG Tabs tablet Take 1 tablet by mouth daily at 12 noon.        Follow-up Information     [facility] : [facility]. Schedule an appointment as soon as possible for a visit in  4 day(s).   Specialty: Obstetrics and Gynecology Why: Return to MAU for second steroid injection tonight at 9:30 PM. Follow-up in the office by Tuesday of next week.  Contact information: [address] [place] [address] [phone]                Signed: Roma Schanz 10/13/2020, 11:54 AM

## 2020-10-13 NOTE — Progress Notes (Signed)
Lizbeth Bark CNM at bedside for assessment.  SVE unchanged.  Will D/C home.  Pt continues to c/o back discomfort.  Medicated. Has appt next Friday.  Call office to change appt to Tuesday or Wednesday.

## 2020-10-13 NOTE — MAU Provider Note (Signed)
Chief Complaint:  Injections (BMZ)   First Provider Initiated Contact with Patient 10/13/20 2330      HPI: Melissa [name] is a 34 y.o. G4P3003 at [redacted]w[redacted]d who presents to maternity admissions for second dose of betamethasone.  She reports contractions are still ever 15-20 minutes and irregular but are a little stronger now than when she was discharge this morning from [facility].  She also reports feeling less movement today than usual.   She reports good fetal movement since monitors applied in MAU.  Location: low back radiating to low abdomen Quality: cramping Severity: 6/10 on pain scale Duration: 1 day Timing: Q 15-20 minute Modifying factors: None Associated signs and symptoms: None  HPI  Past Medical History: Past Medical History:  Diagnosis Date  . Abnormal Pap smear 2010   Repeat pap done;Hysteroscopy;Last pap 2012;was normal  . Anemia 2010   Iron supplements PP  . Asthma    Has a nebulizer;triggered by stress  . Chest pain   . Colon polyps   . Headache(784.0)   . Heart attack [facility])    MD thinks may have had a heart attack;Cardiologist @ [facility]  . History of CVA (cerebrovascular accident) 04/09/2013   Pt reports presumptive "stroke" when she was 34y.o.; followed by [facility] neurology for "chronic nerve pain" and has been Rx'd Hydrocodone and Mobic  . Hypertension   . Infection    BV;may get frequently, UTI  . Multiple food allergies   . Nerve pain    Chronic;d/t possible stroke and MI  . Placenta previa antepartum 2010   Placenta moved 3 days before baby was born  . Pre-diabetes   . Shortness of breath   . Sickle cell trait (HCC)   . Stroke [facility]) 2013   MD thought may have had a stroke and heart attack;has neurologist @ [facility]  . Vitamin D deficiency     Past obstetric history: OB History  Gravida Para Term Preterm AB Living  4 3 3     3   SAB TAB Ectopic Multiple Live Births        0 3    # Outcome Date GA Lbr Len/2nd Weight Sex Delivery  Anes PTL Lv  4 Current           3 Term 11/29/14 [redacted]w[redacted]d 09:28 / 00:19 3475 g F Vag-Spont EPI  LIV  2 Term 10/19/13 [redacted]w[redacted]d 14:01 / 00:28 3912 g M Vag-Spont EPI  LIV  1 Term 03/11/09 [redacted]w[redacted]d  3175 g F Vag-Spont None  LIV     Birth Comments: Hemorrhaging after delivery;was given 2 shots to stop the bleeding    Past Surgical History: Past Surgical History:  Procedure Laterality Date  . HYSTEROSCOPY  2010  . LEFT HEART CATH AND CORONARY ANGIOGRAPHY N/A 02/18/2019   Procedure: LEFT HEART CATH AND CORONARY ANGIOGRAPHY;  Surgeon: 02/20/2019, MD;  Location: [facility] INVASIVE CV LAB;  Service: Cardiovascular;  Laterality: N/A;    Family History: Family History  Problem Relation Age of Onset  . Sickle cell trait Father   . Hypertension Mother   . Anemia Mother   . Asthma Mother   . Diabetes Mother   . Cervical cancer Paternal Aunt   . Cervical cancer Paternal Grandmother   . Diabetes Paternal Grandmother   . Diabetes Paternal Grandfather   . Diabetes Paternal Aunt   . Asthma Brother   . Asthma Daughter   . Asthma Son     Social History: Social History  Tobacco Use  . Smoking status: Never Smoker  . Smokeless tobacco: Never Used  Vaping Use  . Vaping Use: Never used  Substance Use Topics  . Alcohol use: Not Currently    Alcohol/week: 0.0 standard drinks    Comment: last use 03/16/2020  . Drug use: No    Allergies:  Allergies  Allergen Reactions  . Ginger Anaphylaxis    Meds:  Medications Prior to Admission  Medication Sig Dispense Refill Last Dose  . acetaminophen (TYLENOL) 325 MG tablet Take 2 tablets (650 mg total) by mouth every 4 (four) hours as needed (for pain scale < 4  OR  temperature  >/=  100.5 F). 30 tablet 0   . fluticasone (FLONASE) 50 MCG/ACT nasal spray Place 2 sprays into both nostrils daily.     . Misc. Devices MISC Dispense one maternity belt for patient 1 each 0   . Prenatal Vit-Fe Fumarate-FA (MULTIVITAMIN-PRENATAL) 27-0.8 MG TABS tablet Take 1 tablet  by mouth daily at 12 noon. 30 tablet 0     ROS:  Review of Systems   I have reviewed patient's Past Medical Hx, Surgical Hx, Family Hx, Social Hx, medications and allergies.   Physical Exam   Patient Vitals for the past 24 hrs:  BP Temp Temp src Pulse Resp SpO2  10/13/20 2221 111/71 97.9 F (36.6 C) Oral (!) 113 12 95 %   Constitutional: Well-developed, well-nourished female in no acute distress.  Cardiovascular: normal rate Respiratory: normal effort GI: Abd soft, non-tender, gravid appropriate for gestational age.  MS: Extremities nontender, no edema, normal ROM Neurologic: Alert and oriented x 4.  GU: Neg CVAT.  PELVIC EXAM: Cervix pink, visually closed, without lesion, scant white creamy discharge, vaginal walls and external genitalia normal Bimanual exam: Cervix 0/long/high, firm, anterior, neg CMT, uterus nontender, nonenlarged, adnexa without tenderness, enlargement, or mass  Dilation: 2.5 Effacement (%): 50 Station: -2 Presentation: Vertex Exam by:: Arbie Cookey, CNM  FHT:  Baseline 125 , moderate variability, accelerations present, no decelerations Contractions: rare, mild to palpation   Labs: No results found for this or any previous visit (from the past 24 hour(s)). --/--/O POS (10/13 2101)  Imaging:  No results found.  MAU Course/MDM: Orders Placed This Encounter  Procedures  . Discharge patient    Meds ordered this encounter  Medications  . betamethasone acetate-betamethasone sodium phosphate (CELESTONE) injection 12 mg     NST reviewed and reactive Pt feeling normal fetal movement now in MAU Cervix unchanged from discharge this morning 2.5-3/50/-2 so no evidence of active preterm labor Discussed options with pt, staying in MAU x 2 hours for recheck of cervix or discharge now and pt prefers discharge now and will return if contractions worsen BMZ second dose given as scheduled F/U in office, return to MAU as needed for  emergencies   Assessment: 1. Threatened preterm labor, third trimester   2. Decreased fetal movements in third trimester, single or unspecified fetus   3. NST (non-stress test) reactive     Plan: Discharge home Labor precautions and fetal kick counts  Allergies as of 10/13/2020      Reactions   Ginger Anaphylaxis      Medication List    TAKE these medications   acetaminophen 325 MG tablet Commonly known as: TYLENOL Take 2 tablets (650 mg total) by mouth every 4 (four) hours as needed (for pain scale < 4  OR  temperature  >/=  100.5 F).   fluticasone 50 MCG/ACT nasal spray Commonly  known as: FLONASE Place 2 sprays into both nostrils daily.   Misc. Devices Misc Dispense one maternity belt for patient   multivitamin-prenatal 27-0.8 MG Tabs tablet Take 1 tablet by mouth daily at 12 noon.       Sharen Counter Certified Nurse-Midwife 10/13/2020 11:55 PM

## 2020-10-13 NOTE — Progress Notes (Signed)
Labor Progress Note  Melissa [name] is a 34 y.o. female, G4P3003, IUP at 35.4 weeks, presenting for preterm labor @ 35.3. Pt endorse + Fm. Denies vaginal leakage. Denies vaginal bleeding.    Prenatal Medical Problem:  Anemia of pregnancy (Hgb 10.4 at 27 weeks, rx'd Fe) Asthma (On Pulmocort and Dulera) Atypical squamous cells of undetermined significance (Repeat 1 year) Benign essential hypertension (Hx of taking Labetalol, amlodipine. Medication stopped about 6 monts before current pregnancy as her blood pressures normalized) Cardiac catheterization (Cardiology: Dr. Tenny Craw of [facility]. Catheterization done for chest pain and EKG findings in Feb 2020. Catheterization was normal) Chronic pain (Hx of pain seen at pain clinic) Decreased vitamin K (Vit d 04/25/20: 10.8, advised usage of 4000 iu Po vitamin d daily) Genital herpes simplex (Type 2 by titer, no prodromal s/sx, no lesion, not on valtrex) History of cerebrovascular accident without residual deficits (? CVA age 71 in 2011) History of pre-eclampsia (10 days pp after second pregnancy in 2014 had preeclampsia. Per patient she also had a stroke then, review of her records shows her MRI was negative) Postpartum depression (Hx therapy after last delivery 2015, no meds currently) Hyperemesis gravidarum (Controlled by steroids.) Alcohol intoxication (In first trimester before finding out she was pregnant)   Subjective: Pt remains awake, complaining of back pain that radiates around her front and endorses she feels they are cxt, pt stable, took IV benadryl for sleep now. Pt denies leakage of fluid. Pt was requested tobe check at 0400 and cervix has not changed, Toco unable to appreciate cxt, pt endorses still feeling they irregular and spaced out, maybe once every 15-20 mins, but not sure.  Patient Active Problem List   Diagnosis Date Noted  . Preterm labor in third trimester 10/12/2020  . [redacted] weeks gestation of pregnancy 07/27/2020  .  Abdominal trauma 07/26/2020  . Hyperemesis gravidarum with dehydration 05/16/2020  . Chronic pain 03/05/2019  . Acute pericarditis 02/18/2019  . Asthma exacerbation 02/07/2019  . Acute hypoxemic respiratory failure (HCC) 02/07/2019  . Mild persistent chronic asthma without complication 09/22/2015  . Laryngopharyngeal reflux (LPR)   . Physical deconditioning   . Chronic hypertension complicating or reason for care during pregnancy 11/26/2014  . Chronic pain syndrome 11/26/2014  . Obesity (BMI 30-39.9) 11/26/2014  . Essential hypertension 10/19/2013  . History of CVA (cerebrovascular accident) 04/09/2013  . Sickle cell trait (HCC) 02/16/2013  . Severe persistent asthma with allergic rhinitis with status asthmaticus 02/16/2013  . Migraines 02/16/2013  .      Objective: BP (!) 99/52   Pulse 86   Temp 97.9 F (36.6 C) (Oral)   Resp 16   LMP 02/19/2020   SpO2 98%  No intake/output data recorded. No intake/output data recorded. NST: FHR baseline 120 bpm, Variability: moderate, Accelerations:present, Decelerations:  Absent= Cat 1/Reactive CTX:  Occ, not on toco Uterus gravid, soft non tender, unable to palpate contractions.  SVE:  Dilation: 2.5 Effacement (%): 60 Station: -3 Exam by:: Otelia Santee, RNC  Assessment:  Melissa [name] is a 34 y.o. female, G4P3003, IUP at 35.4 weeks, presenting for preterm labor @ 35.3. Pt endorse + Fm. Denies vaginal leakage. Denies vaginal bleeding.    Prenatal Medical Problem:  Anemia of pregnancy (Hgb 10.4 at 27 weeks, rx'd Fe) Asthma (On Pulmocort and Dulera) Atypical squamous cells of undetermined significance (Repeat 1 year) Benign essential hypertension (Hx of taking Labetalol, amlodipine. Medication stopped about 6 monts before current pregnancy as her blood pressures normalized) Cardiac  catheterization (Cardiology: Dr. Tenny Craw of [facility]. Catheterization done for chest pain and EKG findings in Feb 2020. Catheterization was  normal) Chronic pain (Hx of pain seen at pain clinic) Decreased vitamin K (Vit d 04/25/20: 10.8, advised usage of 4000 iu Po vitamin d daily) Genital herpes simplex (Type 2 by titer, no prodromal s/sx, no lesion, not on valtrex) History of cerebrovascular accident without residual deficits (? CVA age 46 in 2011) History of pre-eclampsia (10 days pp after second pregnancy in 2014 had preeclampsia. Per patient she also had a stroke then, review of her records shows her MRI was negative) Postpartum depression (Hx therapy after last delivery 2015, no meds currently) Hyperemesis gravidarum (Controlled by steroids.) Alcohol intoxication (In first trimester before finding out she was pregnant)  Currently pt stable, no cervical change in  12 hours, cxt have spaced. Pt was most likely in false labor.  Patient Active Problem List   Diagnosis Date Noted  . Preterm labor in third trimester 10/12/2020  . [redacted] weeks gestation of pregnancy 07/27/2020  . Abdominal trauma 07/26/2020  . Hyperemesis gravidarum with dehydration 05/16/2020  . Chronic pain 03/05/2019  . Acute pericarditis 02/18/2019  . Asthma exacerbation 02/07/2019  . Acute hypoxemic respiratory failure (HCC) 02/07/2019  . Mild persistent chronic asthma without complication 09/22/2015  . Laryngopharyngeal reflux (LPR)   . Physical deconditioning   . Chronic hypertension complicating or reason for care during pregnancy 11/26/2014  . Chronic pain syndrome 11/26/2014  . Obesity (BMI 30-39.9) 11/26/2014  . Essential hypertension 10/19/2013  . History of CVA (cerebrovascular accident) 04/09/2013  . Sickle cell trait (HCC) 02/16/2013  . Severe persistent asthma with allergic rhinitis with status asthmaticus 02/16/2013  . Migraines 02/16/2013   NICHD: Category 1  Membranes: Intact, no s/s of infection  Pain management:               IV pain management: xPRN             Epidural placement:  PRN  GBS NegATIVE  H/O PreE and HTN/CVA:  asymptomatic, BP stable 99/52, labs unremarkable.    Plan: Continue labor plan Continuous/intermittent monitoring Rest Ambulate Frequent position changes to facilitate fetal rotation and descent. Will reassess with cervical exam if necessary Plan for Maureen Ralphs to re-evaluate for possible transfer to [facility] to receive second dose of BMZ tonight versus going home.  Pending Gc/C and UC.  2nd dose BMZ due tonight H/O PPD: Monitor Mood, recommend zoloft PP  Maureen Ralphs will be given report with DR Sallye Ober at 0700 and they will assume care of pt.   Dale , NP-C, CNM, MSN 10/13/2020. 5:51 AM

## 2020-10-13 NOTE — Progress Notes (Signed)
Reviewed D/C instructions with pt and family friend.  Copy of instructions given to pt.  Return to MAU tonight around 930pm for second dose of betamethasone 12mg  IM.  Make appt with [facility] for this coming Tuesday or Wednesday for follow up.  Pt monitors off.  IV D/C.  Site WNL.  Pt eating.  Will call out when ready to leave.

## 2020-10-13 NOTE — MAU Note (Signed)
Pt reports to MAU for second dose of BMZ.   Pt reports ctx's that started 2030. Pt reports that they 15-20 minutes apart.   Denies vaginal bleeding.   Denies LOF.   Reports decreased fetal movement today.

## 2020-10-15 ENCOUNTER — Inpatient Hospital Stay (HOSPITAL_COMMUNITY)
Admission: AD | Admit: 2020-10-15 | Discharge: 2020-10-15 | Disposition: A | Discharge status: Home / Self Care | Payer: BC Managed Care – PPO | Attending: Obstetrics and Gynecology | Admitting: Obstetrics and Gynecology

## 2020-10-15 ENCOUNTER — Other Ambulatory Visit: Payer: Self-pay

## 2020-10-15 ENCOUNTER — Encounter (HOSPITAL_COMMUNITY): Payer: Self-pay | Admitting: Obstetrics and Gynecology

## 2020-10-15 DIAGNOSIS — Z833 Family history of diabetes mellitus: Secondary | ICD-10-CM | POA: Insufficient documentation

## 2020-10-15 DIAGNOSIS — O4703 False labor before 37 completed weeks of gestation, third trimester: Secondary | ICD-10-CM | POA: Insufficient documentation

## 2020-10-15 DIAGNOSIS — O99013 Anemia complicating pregnancy, third trimester: Secondary | ICD-10-CM | POA: Diagnosis not present

## 2020-10-15 DIAGNOSIS — O99891 Other specified diseases and conditions complicating pregnancy: Secondary | ICD-10-CM | POA: Insufficient documentation

## 2020-10-15 DIAGNOSIS — O479 False labor, unspecified: Secondary | ICD-10-CM

## 2020-10-15 DIAGNOSIS — O10913 Unspecified pre-existing hypertension complicating pregnancy, third trimester: Secondary | ICD-10-CM | POA: Diagnosis not present

## 2020-10-15 DIAGNOSIS — O99113 Other diseases of the blood and blood-forming organs and certain disorders involving the immune mechanism complicating pregnancy, third trimester: Secondary | ICD-10-CM | POA: Diagnosis not present

## 2020-10-15 DIAGNOSIS — O99513 Diseases of the respiratory system complicating pregnancy, third trimester: Secondary | ICD-10-CM | POA: Diagnosis not present

## 2020-10-15 DIAGNOSIS — Z8673 Personal history of transient ischemic attack (TIA), and cerebral infarction without residual deficits: Secondary | ICD-10-CM | POA: Diagnosis not present

## 2020-10-15 DIAGNOSIS — Z0371 Encounter for suspected problem with amniotic cavity and membrane ruled out: Secondary | ICD-10-CM | POA: Diagnosis not present

## 2020-10-15 DIAGNOSIS — J45909 Unspecified asthma, uncomplicated: Secondary | ICD-10-CM | POA: Diagnosis not present

## 2020-10-15 DIAGNOSIS — Z3A35 35 weeks gestation of pregnancy: Secondary | ICD-10-CM | POA: Diagnosis not present

## 2020-10-15 DIAGNOSIS — I252 Old myocardial infarction: Secondary | ICD-10-CM | POA: Insufficient documentation

## 2020-10-15 DIAGNOSIS — Z87440 Personal history of urinary (tract) infections: Secondary | ICD-10-CM | POA: Diagnosis not present

## 2020-10-15 DIAGNOSIS — Z8249 Family history of ischemic heart disease and other diseases of the circulatory system: Secondary | ICD-10-CM | POA: Diagnosis not present

## 2020-10-15 DIAGNOSIS — D649 Anemia, unspecified: Secondary | ICD-10-CM | POA: Diagnosis not present

## 2020-10-15 DIAGNOSIS — Z832 Family history of diseases of the blood and blood-forming organs and certain disorders involving the immune mechanism: Secondary | ICD-10-CM | POA: Diagnosis not present

## 2020-10-15 DIAGNOSIS — Z825 Family history of asthma and other chronic lower respiratory diseases: Secondary | ICD-10-CM | POA: Insufficient documentation

## 2020-10-15 DIAGNOSIS — D571 Sickle-cell disease without crisis: Secondary | ICD-10-CM | POA: Insufficient documentation

## 2020-10-15 DIAGNOSIS — R7303 Prediabetes: Secondary | ICD-10-CM | POA: Diagnosis not present

## 2020-10-15 LAB — URINALYSIS, ROUTINE W REFLEX MICROSCOPIC
Bilirubin Urine: NEGATIVE
Glucose, UA: NEGATIVE mg/dL
Hgb urine dipstick: NEGATIVE
Ketones, ur: 5 mg/dL — AB
Nitrite: NEGATIVE
Protein, ur: 30 mg/dL — AB
Specific Gravity, Urine: 1.027 (ref 1.005–1.030)
pH: 5 (ref 5.0–8.0)

## 2020-10-15 LAB — POCT FERN TEST: POCT Fern Test: NEGATIVE

## 2020-10-15 NOTE — MAU Note (Signed)
Pt stated she has been having ctx off and on since yesterday. Was admitted hospital beginning of the week went home on Wed. Had second BMZ on Thursday. 3cm dilated when she was here. Reports feeling some wetness but not sure if her water is broke. Good fetal movement reproted

## 2020-10-15 NOTE — MAU Provider Note (Signed)
History     CSN: [id number]  Arrival date and time: 10/15/20 1447   First Provider Initiated Contact with Patient 10/15/20 1540      Chief Complaint  Patient presents with  . Contractions   Melissa [name] is a 34 y.o. G4P3003 at [redacted]w[redacted]d who presents with contractions and vaginal discharge. Was admitted last week for preterm contractions & cervical dilation. States last time she was checked she was "almost 3 cm". Current contractions occur less than 10 times per hour and not all are painful. Has noticed that her underwear have seemed damp today but no gushes of fluid or trickling. Denies vaginal bleeding. Good fetal movement. Has appointment with [facility] next week.   Location: abdomen Quality: contractions & tightening Severity: 6/10 on pain scale Duration: 1 day Timing: intermittent Modifying factors: none Associated signs and symptoms: vaginal discharge    OB History    Gravida  4   Para  3   Term  3   Preterm      AB      Living  3     SAB      TAB      Ectopic      Multiple  0   Live Births  3           Past Medical History:  Diagnosis Date  . Abnormal Pap smear 2010   Repeat pap done;Hysteroscopy;Last pap 2012;was normal  . Anemia 2010   Iron supplements PP  . Asthma    Has a nebulizer;triggered by stress  . Chest pain   . Colon polyps   . Headache(784.0)   . Heart attack [facility])    MD thinks may have had a heart attack;Cardiologist @ [facility]  . History of CVA (cerebrovascular accident) 04/09/2013   Pt reports presumptive "stroke" when she was 34y.o.; followed by [facility] neurology for "chronic nerve pain" and has been Rx'd Hydrocodone and Mobic  . Hypertension   . Infection    BV;may get frequently, UTI  . Multiple food allergies   . Nerve pain    Chronic;d/t possible stroke and MI  . Placenta previa antepartum 2010   Placenta moved 3 days before baby was born  . Pre-diabetes   . Shortness of breath   . Sickle cell trait (HCC)   . Stroke [facility])  2013   MD thought may have had a stroke and heart attack;has neurologist @ [facility]  . Vitamin D deficiency     Past Surgical History:  Procedure Laterality Date  . HYSTEROSCOPY  2010  . LEFT HEART CATH AND CORONARY ANGIOGRAPHY N/A 02/18/2019   Procedure: LEFT HEART CATH AND CORONARY ANGIOGRAPHY;  Surgeon: [name] Lex, MD;  Location: [facility] INVASIVE CV LAB;  Service: Cardiovascular;  Laterality: N/A;    Family History  Problem Relation Age of Onset  . Sickle cell trait Father   . Hypertension Mother   . Anemia Mother   . Asthma Mother   . Diabetes Mother   . Cervical cancer Paternal Aunt   . Cervical cancer Paternal Grandmother   . Diabetes Paternal Grandmother   . Diabetes Paternal Grandfather   . Diabetes Paternal Aunt   . Asthma Brother   . Asthma Daughter   . Asthma Son     Social History   Tobacco Use  . Smoking status: Never Smoker  . Smokeless tobacco: Never Used  Vaping Use  . Vaping Use: Never used  Substance Use Topics  . Alcohol use: Not  Currently    Alcohol/week: 0.0 standard drinks    Comment: last use 03/16/2020  . Drug use: No    Allergies:  Allergies  Allergen Reactions  . Ginger Anaphylaxis    No medications prior to admission.    Review of Systems  Constitutional: Negative.   Gastrointestinal: Positive for abdominal pain.  Genitourinary: Positive for vaginal discharge. Negative for dysuria and vaginal bleeding.   Physical Exam   Blood pressure 111/74, pulse 98, temperature 98.2 F (36.8 C), resp. rate 18, last menstrual period 02/19/2020, unknown if currently breastfeeding.  Physical Exam Vitals and nursing note reviewed. Exam conducted with a chaperone present.  Constitutional:      General: She is not in acute distress.    Appearance: Normal appearance.  HENT:     Head: Normocephalic and atraumatic.  Pulmonary:     Effort: Pulmonary effort is normal. No respiratory distress.  Abdominal:     Tenderness: There is  no abdominal tenderness.     Comments: Gravid uterus  Genitourinary:    General: Normal vulva.     Exam position: Lithotomy position.     Comments: No pooling of fluid. Normal discharge.   Dilation: 2.5 Effacement (%): 50 Cervical Position: Posterior Presentation: Vertex Exam by:: E.Ilean Spradlin,NP  Neurological:     Mental Status: She is alert.    Fetal Tracing:  Baseline: 135 Variability: moderate Accelerations: 15x15 Decelerations: none  Toco: irregular   MAU Course  Procedures Results for orders placed or performed during the hospital encounter of 10/15/20 (from the past 24 hour(s))  Urinalysis, Routine w reflex microscopic Urine, Clean Catch     Status: Abnormal   Collection Time: 10/15/20  3:22 PM  Result Value Ref Range   Color, Urine AMBER (A) YELLOW   APPearance HAZY (A) CLEAR   Specific Gravity, Urine 1.027 1.005 - 1.030   pH 5.0 5.0 - 8.0   Glucose, UA NEGATIVE NEGATIVE mg/dL   Hgb urine dipstick NEGATIVE NEGATIVE   Bilirubin Urine NEGATIVE NEGATIVE   Ketones, ur 5 (A) NEGATIVE mg/dL   Protein, ur 30 (A) NEGATIVE mg/dL   Nitrite NEGATIVE NEGATIVE   Leukocytes,Ua MODERATE (A) NEGATIVE   RBC / HPF 0-5 0 - 5 RBC/hpf   WBC, UA 21-50 0 - 5 WBC/hpf   Bacteria, UA RARE (A) NONE SEEN   Squamous Epithelial / LPF 6-10 0 - 5   Mucus PRESENT   POCT fern test     Status: Normal   Collection Time: 10/15/20  4:03 PM  Result Value Ref Range   POCT Fern Test Negative = intact amniotic membranes     MDM No regular contractions on monitor and abdomen soft. Patient can't tell frequency and not all are painful. Cervix is 2.5/50/-3. Given option of continued monitoring & recheck vs discharge home. Patient's preference is to be discharged home. Received BMZ last week when she was admitted.   SSE performed. No pooling of fluid. Fern negative.   U/a with moderate leuks. No urinary complaints. Will send urine for culture.    Assessment and Plan   1. Encounter for  suspected PROM, with rupture of membranes not found  -no pooling and negative fern  2. Braxton Hick's contraction  -no cervical change since Thursday. No regular ctx on monitor. Reviewed s/s of labor & reasons to return to MAU  3. [redacted] weeks gestation of pregnancy      Judeth Horn 10/15/2020, 5:16 PM

## 2020-10-15 NOTE — Discharge Instructions (Signed)
Fetal Movement Counts Patient Name: ________________________________________________ Patient Due Date: ____________________ What is a fetal movement count?  A fetal movement count is the number of times that you feel your baby move during a certain amount of time. This may also be called a fetal kick count. A fetal movement count is recommended for every pregnant woman. You may be asked to start counting fetal movements as early as week 28 of your pregnancy. Pay attention to when your baby is most active. You may notice your baby's sleep and wake cycles. You may also notice things that make your baby move more. You should do a fetal movement count:  When your baby is normally most active.  At the same time each day. A good time to count movements is while you are resting, after having something to eat and drink. How do I count fetal movements? 1. Find a quiet, comfortable area. Sit, or lie down on your side. 2. Write down the date, the start time and stop time, and the number of movements that you felt between those two times. Take this information with you to your health care visits. 3. Write down your start time when you feel the first movement. 4. Count kicks, flutters, swishes, rolls, and jabs. You should feel at least 10 movements. 5. You may stop counting after you have felt 10 movements, or if you have been counting for 2 hours. Write down the stop time. 6. If you do not feel 10 movements in 2 hours, contact your health care provider for further instructions. Your health care provider may want to do additional tests to assess your baby's well-being. Contact a health care provider if:  You feel fewer than 10 movements in 2 hours.  Your baby is not moving like he or she usually does. Date: ____________ Start time: ____________ Stop time: ____________ Movements: ____________ Date: ____________ Start time: ____________ Stop time: ____________ Movements: ____________ Date: ____________  Start time: ____________ Stop time: ____________ Movements: ____________ Date: ____________ Start time: ____________ Stop time: ____________ Movements: ____________ Date: ____________ Start time: ____________ Stop time: ____________ Movements: ____________ Date: ____________ Start time: ____________ Stop time: ____________ Movements: ____________ Date: ____________ Start time: ____________ Stop time: ____________ Movements: ____________ Date: ____________ Start time: ____________ Stop time: ____________ Movements: ____________ Date: ____________ Start time: ____________ Stop time: ____________ Movements: ____________ This information is not intended to replace advice given to you by your health care provider. Make sure you discuss any questions you have with your health care provider. Document Revised: 08/06/2019 Document Reviewed: 08/06/2019 Elsevier Patient Education  2020 [employer] Braxton Hicks Contractions Contractions of the uterus can occur throughout pregnancy, but they are not always a sign that you are in labor. You may have practice contractions called Braxton Hicks contractions. These false labor contractions are sometimes confused with true labor. What are Braxton Hicks contractions? Braxton Hicks contractions are tightening movements that occur in the muscles of the uterus before labor. Unlike true labor contractions, these contractions do not result in opening (dilation) and thinning of the cervix. Toward the end of pregnancy (32-34 weeks), Braxton Hicks contractions can happen more often and may become stronger. These contractions are sometimes difficult to tell apart from true labor because they can be very uncomfortable. You should not feel embarrassed if you go to the hospital with false labor. Sometimes, the only way to tell if you are in true labor is for your health care provider to look for changes in the cervix. The health care provider   will do a physical exam and may  monitor your contractions. If you are not in true labor, the exam should show that your cervix is not dilating and your water has not broken. If there are no other health problems associated with your pregnancy, it is completely safe for you to be sent home with false labor. You may continue to have Braxton Hicks contractions until you go into true labor. How to tell the difference between true labor and false labor True labor  Contractions last 30-70 seconds.  Contractions become very regular.  Discomfort is usually felt in the top of the uterus, and it spreads to the lower abdomen and low back.  Contractions do not go away with walking.  Contractions usually become more intense and increase in frequency.  The cervix dilates and gets thinner. False labor  Contractions are usually shorter and not as strong as true labor contractions.  Contractions are usually irregular.  Contractions are often felt in the front of the lower abdomen and in the groin.  Contractions may go away when you walk around or change positions while lying down.  Contractions get weaker and are shorter-lasting as time goes on.  The cervix usually does not dilate or become thin. Follow these instructions at home:   Take over-the-counter and prescription medicines only as told by your health care provider.  Keep up with your usual exercises and follow other instructions from your health care provider.  Eat and drink lightly if you think you are going into labor.  If Braxton Hicks contractions are making you uncomfortable: ? Change your position from lying down or resting to walking, or change from walking to resting. ? Sit and rest in a tub of warm water. ? Drink enough fluid to keep your urine pale yellow. Dehydration may cause these contractions. ? Do slow and deep breathing several times an hour.  Keep all follow-up prenatal visits as told by your health care provider. This is important. Contact a  health care provider if:  You have a fever.  You have continuous pain in your abdomen. Get help right away if:  Your contractions become stronger, more regular, and closer together.  You have fluid leaking or gushing from your vagina.  You pass blood-tinged mucus (bloody show).  You have bleeding from your vagina.  You have low back pain that you never had before.  You feel your baby's head pushing down and causing pelvic pressure.  Your baby is not moving inside you as much as it used to. Summary  Contractions that occur before labor are called Braxton Hicks contractions, false labor, or practice contractions.  Braxton Hicks contractions are usually shorter, weaker, farther apart, and less regular than true labor contractions. True labor contractions usually become progressively stronger and regular, and they become more frequent.  Manage discomfort from Braxton Hicks contractions by changing position, resting in a warm bath, drinking plenty of water, or practicing deep breathing. This information is not intended to replace advice given to you by your health care provider. Make sure you discuss any questions you have with your health care provider. Document Revised: 11/29/2017 Document Reviewed: 05/02/2017 Elsevier Patient Education  2020 [employer] Inc[employer]  

## 2020-10-16 LAB — CULTURE, OB URINE: Culture: <10000 — AB

## 2020-10-18 DIAGNOSIS — Z3493 Encounter for supervision of normal pregnancy, unspecified, third trimester: Secondary | ICD-10-CM | POA: Diagnosis not present

## 2020-10-18 DIAGNOSIS — Z23 Encounter for immunization: Secondary | ICD-10-CM | POA: Diagnosis not present

## 2020-10-18 DIAGNOSIS — Z3A36 36 weeks gestation of pregnancy: Secondary | ICD-10-CM | POA: Diagnosis not present

## 2020-10-19 ENCOUNTER — Other Ambulatory Visit: Payer: Self-pay

## 2020-10-19 ENCOUNTER — Encounter (HOSPITAL_COMMUNITY): Payer: Self-pay | Admitting: Obstetrics and Gynecology

## 2020-10-19 ENCOUNTER — Inpatient Hospital Stay (HOSPITAL_COMMUNITY)
Admission: AD | Admit: 2020-10-19 | Discharge: 2020-10-19 | Disposition: A | Discharge status: Home / Self Care | Payer: BC Managed Care – PPO | Attending: Obstetrics and Gynecology | Admitting: Obstetrics and Gynecology

## 2020-10-19 DIAGNOSIS — Z3A36 36 weeks gestation of pregnancy: Secondary | ICD-10-CM | POA: Insufficient documentation

## 2020-10-19 DIAGNOSIS — O4703 False labor before 37 completed weeks of gestation, third trimester: Secondary | ICD-10-CM | POA: Insufficient documentation

## 2020-10-19 DIAGNOSIS — O479 False labor, unspecified: Secondary | ICD-10-CM

## 2020-10-19 NOTE — MAU Note (Signed)
Went to dr yesterday, was told if ctxs were 5 min apart, or feeling a lot of pressure- to come get rechecked. 3/75/-1-2

## 2020-10-19 NOTE — Discharge Instructions (Signed)
Fetal Movement Counts Patient Name: ________________________________________________ Patient Due Date: ____________________ What is a fetal movement count?  A fetal movement count is the number of times that you feel your baby move during a certain amount of time. This may also be called a fetal kick count. A fetal movement count is recommended for every pregnant woman. You may be asked to start counting fetal movements as early as week 28 of your pregnancy. Pay attention to when your baby is most active. You may notice your baby's sleep and wake cycles. You may also notice things that make your baby move more. You should do a fetal movement count:  When your baby is normally most active.  At the same time each day. A good time to count movements is while you are resting, after having something to eat and drink. How do I count fetal movements? 1. Find a quiet, comfortable area. Sit, or lie down on your side. 2. Write down the date, the start time and stop time, and the number of movements that you felt between those two times. Take this information with you to your health care visits. 3. Write down your start time when you feel the first movement. 4. Count kicks, flutters, swishes, rolls, and jabs. You should feel at least 10 movements. 5. You may stop counting after you have felt 10 movements, or if you have been counting for 2 hours. Write down the stop time. 6. If you do not feel 10 movements in 2 hours, contact your health care provider for further instructions. Your health care provider may want to do additional tests to assess your baby's well-being. Contact a health care provider if:  You feel fewer than 10 movements in 2 hours.  Your baby is not moving like he or she usually does. Date: ____________ Start time: ____________ Stop time: ____________ Movements: ____________ Date: ____________ Start time: ____________ Stop time: ____________ Movements: ____________ Date: ____________  Start time: ____________ Stop time: ____________ Movements: ____________ Date: ____________ Start time: ____________ Stop time: ____________ Movements: ____________ Date: ____________ Start time: ____________ Stop time: ____________ Movements: ____________ Date: ____________ Start time: ____________ Stop time: ____________ Movements: ____________ Date: ____________ Start time: ____________ Stop time: ____________ Movements: ____________ Date: ____________ Start time: ____________ Stop time: ____________ Movements: ____________ Date: ____________ Start time: ____________ Stop time: ____________ Movements: ____________ This information is not intended to replace advice given to you by your health care provider. Make sure you discuss any questions you have with your health care provider. Document Revised: 08/06/2019 Document Reviewed: 08/06/2019 [employer] Patient Education  2020 [employer]        Signs and Symptoms of Labor Labor is your body's natural process of moving your baby, placenta, and umbilical cord out of your uterus. The process of labor usually starts when your baby is full-term, between 37 and 40 weeks of pregnancy. How will I know when I am close to going into labor? As your body prepares for labor and the birth of your baby, you may notice the following symptoms in the weeks and days before true labor starts:  Having a strong desire to get your home ready to receive your new baby. This is called nesting. Nesting may be a sign that labor is approaching, and it may occur several weeks before birth. Nesting may involve cleaning and organizing your home.  Passing a small amount of thick, bloody mucus out of your vagina (normal bloody show or losing your mucus plug). This may happen more than a   week before labor begins, or it might occur right before labor begins as the opening of the cervix starts to widen (dilate). For some women, the entire mucus plug passes at once. For others,  smaller portions of the mucus plug may gradually pass over several days.  Your baby moving (dropping) lower in your pelvis to get into position for birth (lightening). When this happens, you may feel more pressure on your bladder and pelvic bone and less pressure on your ribs. This may make it easier to breathe. It may also cause you to need to urinate more often and have problems with bowel movements.  Having "practice contractions" (Braxton Hicks contractions) that occur at irregular (unevenly spaced) intervals that are more than 10 minutes apart. This is also called false labor. False labor contractions are common after exercise or sexual activity, and they will stop if you change position, rest, or drink fluids. These contractions are usually mild and do not get stronger over time. They may feel like: ? A backache or back pain. ? Mild cramps, similar to menstrual cramps. ? Tightening or pressure in your abdomen. Other early symptoms that labor may be starting soon include:  Nausea or loss of appetite.  Diarrhea.  Having a sudden burst of energy, or feeling very tired.  Mood changes.  Having trouble sleeping. How will I know when labor has begun? Signs that true labor has begun may include:  Having contractions that come at regular (evenly spaced) intervals and increase in intensity. This may feel like more intense tightening or pressure in your abdomen that moves to your back. ? Contractions may also feel like rhythmic pain in your upper thighs or back that comes and goes at regular intervals. ? For first-time mothers, this change in intensity of contractions often occurs at a more gradual pace. ? Women who have given birth before may notice a more rapid progression of contraction changes.  Having a feeling of pressure in the vaginal area.  Your water breaking (rupture of membranes). This is when the sac of fluid that surrounds your baby breaks. When this happens, you will notice  fluid leaking from your vagina. This may be clear or blood-tinged. Labor usually starts within 24 hours of your water breaking, but it may take longer to begin. ? Some women notice this as a gush of fluid. ? Others notice that their underwear repeatedly becomes damp. Follow these instructions at home:   When labor starts, or if your water breaks, call your health care provider or nurse care line. Based on your situation, they will determine when you should go in for an exam.  When you are in early labor, you may be able to rest and manage symptoms at home. Some strategies to try at home include: ? Breathing and relaxation techniques. ? Taking a warm bath or shower. ? Listening to music. ? Using a heating pad on the lower back for pain. If you are directed to use heat:  Place a towel between your skin and the heat source.  Leave the heat on for 20-30 minutes.  Remove the heat if your skin turns bright red. This is especially important if you are unable to feel pain, heat, or cold. You may have a greater risk of getting burned. Get help right away if:  You have painful, regular contractions that are 5 minutes apart or less.  Labor starts before you are [redacted] weeks along in your pregnancy.  You have a fever.  You have   a headache that does not go away.  You have bright red blood coming from your vagina.  You do not feel your baby moving.  You have a sudden onset of: ? Severe headache with vision problems. ? Nausea, vomiting, or diarrhea. ? Chest pain or shortness of breath. These symptoms may be an emergency. If your health care provider recommends that you go to the hospital or birth center where you plan to deliver, do not drive yourself. Have someone else drive you, or call emergency services (911 in the [place] Summary  Labor is your body's natural process of moving your baby, placenta, and umbilical cord out of your uterus.  The process of labor usually starts when your baby is  full-term, between 37 and 40 weeks of pregnancy.  When labor starts, or if your water breaks, call your health care provider or nurse care line. Based on your situation, they will determine when you should go in for an exam. This information is not intended to replace advice given to you by your health care provider. Make sure you discuss any questions you have with your health care provider. Document Revised: 09/16/2017 Document Reviewed: 05/24/2017 [employer] Patient Education  2020 [employer]  

## 2020-10-19 NOTE — [facility] Provider Note (Signed)
S: Patient is here for RN labor evaluation. Strip, vital signs, & chart Reviewed   O:  Vitals:   10/19/20 1706  BP: 123/76  Pulse: 100  Resp: 18  Temp: 98.2 F (36.8 C)  TempSrc: Oral  SpO2: 100%  Weight: 89.6 kg  Height: 5\' 5"  (1.651 m)   No results found for this or any previous visit (from the past 24 hour(s)).  Dilation: 2.5 Effacement (%): 50 Cervical Position: Posterior Station: Ballotable Presentation: Undeterminable Exam by:: n druebbisch rn   FHR: 140 bpm, Mod Var, no Decels, 15x15 Accels UC: irregular   A: 1. False labor   2. [redacted] weeks gestation of pregnancy      P:  RN to discharge home in stable condition with return precautions & fetal kick counts  [phone] FNP 6:57 PM

## 2020-10-20 ENCOUNTER — Encounter: Payer: Self-pay | Admitting: Nurse Practitioner

## 2020-10-21 DIAGNOSIS — O10019 Pre-existing essential hypertension complicating pregnancy, unspecified trimester: Secondary | ICD-10-CM | POA: Diagnosis not present

## 2020-10-21 DIAGNOSIS — Z3A36 36 weeks gestation of pregnancy: Secondary | ICD-10-CM | POA: Diagnosis not present

## 2020-10-24 ENCOUNTER — Other Ambulatory Visit: Payer: Self-pay | Admitting: Obstetrics & Gynecology

## 2020-10-26 ENCOUNTER — Encounter (HOSPITAL_COMMUNITY): Payer: Self-pay | Admitting: *Deleted

## 2020-10-26 ENCOUNTER — Telehealth (HOSPITAL_COMMUNITY): Payer: Self-pay | Admitting: *Deleted

## 2020-10-26 NOTE — Telephone Encounter (Signed)
Preadmission screen  

## 2020-10-27 DIAGNOSIS — N858 Other specified noninflammatory disorders of uterus: Secondary | ICD-10-CM | POA: Diagnosis not present

## 2020-10-28 DIAGNOSIS — O10019 Pre-existing essential hypertension complicating pregnancy, unspecified trimester: Secondary | ICD-10-CM | POA: Diagnosis not present

## 2020-10-28 DIAGNOSIS — Z3A37 37 weeks gestation of pregnancy: Secondary | ICD-10-CM | POA: Diagnosis not present

## 2020-10-29 ENCOUNTER — Other Ambulatory Visit (HOSPITAL_COMMUNITY)
Admission: RE | Admit: 2020-10-29 | Discharge: 2020-10-29 | Disposition: A | Discharge status: Home / Self Care | Payer: BC Managed Care – PPO | Source: Ambulatory Visit | Attending: Obstetrics & Gynecology | Admitting: Obstetrics & Gynecology

## 2020-10-29 DIAGNOSIS — D573 Sickle-cell trait: Secondary | ICD-10-CM | POA: Diagnosis not present

## 2020-10-29 DIAGNOSIS — Z8673 Personal history of transient ischemic attack (TIA), and cerebral infarction without residual deficits: Secondary | ICD-10-CM | POA: Diagnosis not present

## 2020-10-29 DIAGNOSIS — Z01812 Encounter for preprocedural laboratory examination: Secondary | ICD-10-CM | POA: Insufficient documentation

## 2020-10-29 DIAGNOSIS — Z3A38 38 weeks gestation of pregnancy: Secondary | ICD-10-CM | POA: Diagnosis not present

## 2020-10-29 DIAGNOSIS — O9952 Diseases of the respiratory system complicating childbirth: Secondary | ICD-10-CM | POA: Diagnosis not present

## 2020-10-29 DIAGNOSIS — Z20822 Contact with and (suspected) exposure to covid-19: Secondary | ICD-10-CM | POA: Diagnosis not present

## 2020-10-29 DIAGNOSIS — Z3046 Encounter for surveillance of implantable subdermal contraceptive: Secondary | ICD-10-CM | POA: Diagnosis not present

## 2020-10-29 DIAGNOSIS — Z30017 Encounter for initial prescription of implantable subdermal contraceptive: Secondary | ICD-10-CM | POA: Diagnosis not present

## 2020-10-29 DIAGNOSIS — J4550 Severe persistent asthma, uncomplicated: Secondary | ICD-10-CM | POA: Diagnosis not present

## 2020-10-29 DIAGNOSIS — O1092 Unspecified pre-existing hypertension complicating childbirth: Secondary | ICD-10-CM | POA: Diagnosis not present

## 2020-10-29 DIAGNOSIS — O99214 Obesity complicating childbirth: Secondary | ICD-10-CM | POA: Diagnosis not present

## 2020-10-29 DIAGNOSIS — O1002 Pre-existing essential hypertension complicating childbirth: Secondary | ICD-10-CM | POA: Diagnosis not present

## 2020-10-29 DIAGNOSIS — E669 Obesity, unspecified: Secondary | ICD-10-CM | POA: Diagnosis not present

## 2020-10-29 DIAGNOSIS — O9902 Anemia complicating childbirth: Secondary | ICD-10-CM | POA: Diagnosis not present

## 2020-10-29 DIAGNOSIS — Z23 Encounter for immunization: Secondary | ICD-10-CM | POA: Diagnosis not present

## 2020-10-30 LAB — SARS CORONAVIRUS 2 (TAT 6-24 HRS): SARS Coronavirus 2: NEGATIVE

## 2020-10-31 ENCOUNTER — Inpatient Hospital Stay (HOSPITAL_COMMUNITY): Payer: BC Managed Care – PPO | Admitting: Anesthesiology

## 2020-10-31 ENCOUNTER — Inpatient Hospital Stay (HOSPITAL_COMMUNITY): Payer: BC Managed Care – PPO

## 2020-10-31 ENCOUNTER — Other Ambulatory Visit: Payer: Self-pay

## 2020-10-31 ENCOUNTER — Encounter (HOSPITAL_COMMUNITY): Payer: Self-pay | Admitting: Obstetrics & Gynecology

## 2020-10-31 ENCOUNTER — Inpatient Hospital Stay (HOSPITAL_COMMUNITY)
Admission: AD | Admit: 2020-10-31 | Discharge: 2020-11-02 | DRG: 807 | Disposition: A | Discharge status: Home / Self Care | Payer: BC Managed Care – PPO | Attending: Obstetrics & Gynecology | Admitting: Obstetrics & Gynecology

## 2020-10-31 DIAGNOSIS — I10 Essential (primary) hypertension: Secondary | ICD-10-CM | POA: Diagnosis present

## 2020-10-31 DIAGNOSIS — O1002 Pre-existing essential hypertension complicating childbirth: Principal | ICD-10-CM | POA: Diagnosis present

## 2020-10-31 DIAGNOSIS — O99214 Obesity complicating childbirth: Secondary | ICD-10-CM | POA: Diagnosis present

## 2020-10-31 DIAGNOSIS — Z8673 Personal history of transient ischemic attack (TIA), and cerebral infarction without residual deficits: Secondary | ICD-10-CM | POA: Diagnosis not present

## 2020-10-31 DIAGNOSIS — J4550 Severe persistent asthma, uncomplicated: Secondary | ICD-10-CM | POA: Diagnosis present

## 2020-10-31 DIAGNOSIS — D573 Sickle-cell trait: Secondary | ICD-10-CM | POA: Diagnosis present

## 2020-10-31 DIAGNOSIS — E669 Obesity, unspecified: Secondary | ICD-10-CM | POA: Diagnosis present

## 2020-10-31 DIAGNOSIS — Z30017 Encounter for initial prescription of implantable subdermal contraceptive: Secondary | ICD-10-CM

## 2020-10-31 DIAGNOSIS — O9952 Diseases of the respiratory system complicating childbirth: Secondary | ICD-10-CM | POA: Diagnosis present

## 2020-10-31 DIAGNOSIS — O9902 Anemia complicating childbirth: Secondary | ICD-10-CM | POA: Diagnosis present

## 2020-10-31 DIAGNOSIS — Z20822 Contact with and (suspected) exposure to covid-19: Secondary | ICD-10-CM | POA: Diagnosis present

## 2020-10-31 DIAGNOSIS — O99019 Anemia complicating pregnancy, unspecified trimester: Secondary | ICD-10-CM | POA: Diagnosis present

## 2020-10-31 DIAGNOSIS — Z3A38 38 weeks gestation of pregnancy: Secondary | ICD-10-CM | POA: Diagnosis not present

## 2020-10-31 DIAGNOSIS — Z8659 Personal history of other mental and behavioral disorders: Secondary | ICD-10-CM

## 2020-10-31 DIAGNOSIS — A6000 Herpesviral infection of urogenital system, unspecified: Secondary | ICD-10-CM | POA: Diagnosis present

## 2020-10-31 LAB — CBC
HCT: 33.4 % — ABNORMAL LOW (ref 36.0–46.0)
Hemoglobin: 10.1 g/dL — ABNORMAL LOW (ref 12.0–15.0)
MCH: 24.4 pg — ABNORMAL LOW (ref 26.0–34.0)
MCHC: 30.2 g/dL (ref 30.0–36.0)
MCV: 80.7 fL (ref 80.0–100.0)
Platelets: 180 10*3/uL (ref 150–400)
RBC: 4.14 MIL/uL (ref 3.87–5.11)
RDW: 15.9 % — ABNORMAL HIGH (ref 11.5–15.5)
WBC: 5.8 10*3/uL (ref 4.0–10.5)
nRBC: 0 % (ref 0.0–0.2)

## 2020-10-31 LAB — COMPREHENSIVE METABOLIC PANEL
ALT: 8 U/L (ref 0–44)
AST: 15 U/L (ref 15–41)
Albumin: 2.8 g/dL — ABNORMAL LOW (ref 3.5–5.0)
Alkaline Phosphatase: 126 U/L (ref 38–126)
Anion gap: 10 (ref 5–15)
BUN: <5 mg/dL — ABNORMAL LOW (ref 6–20)
CO2: 19 mmol/L — ABNORMAL LOW (ref 22–32)
Calcium: 8.9 mg/dL (ref 8.9–10.3)
Chloride: 108 mmol/L (ref 98–111)
Creatinine, Ser: 0.54 mg/dL (ref 0.44–1.00)
GFR, Estimated: >60 mL/min (ref >60)
Glucose, Bld: 87 mg/dL (ref 70–99)
Potassium: 3.4 mmol/L — ABNORMAL LOW (ref 3.5–5.1)
Sodium: 137 mmol/L (ref 135–145)
Total Bilirubin: 1 mg/dL (ref 0.3–1.2)
Total Protein: 6.6 g/dL (ref 6.5–8.1)

## 2020-10-31 LAB — RPR: RPR Ser Ql: NONREACTIVE

## 2020-10-31 LAB — TYPE AND SCREEN
ABO/RH(D): O POS
Antibody Screen: NEGATIVE

## 2020-10-31 MED ORDER — PRENATAL MULTIVITAMIN CH
1.0000 | ORAL_TABLET | Freq: Every day | ORAL | Status: DC
  Administered 2020-11-01 – 2020-11-02 (×2): 1 via ORAL
  Filled 2020-10-31 (×2): qty 1

## 2020-10-31 MED ORDER — FENTANYL-BUPIVACAINE-NACL 0.5-0.125-0.9 MG/250ML-% EP SOLN
12.0000 mL/h | EPIDURAL | Status: DC | PRN
  Filled 2020-10-31: qty 250

## 2020-10-31 MED ORDER — ACETAMINOPHEN 325 MG PO TABS: 650.0000 mg | ORAL_TABLET | ORAL | Status: DC | PRN

## 2020-10-31 MED ORDER — SODIUM CHLORIDE (PF) 0.9 % IJ SOLN
INTRAMUSCULAR | Status: DC | PRN
Start: 2020-10-31 — End: 2020-10-31
  Administered 2020-10-31: 12 mL/h via EPIDURAL

## 2020-10-31 MED ORDER — OXYTOCIN-SODIUM CHLORIDE 30-0.9 UT/500ML-% IV SOLN: 2.5000 [IU]/h | INTRAVENOUS | Status: DC

## 2020-10-31 MED ORDER — ALBUTEROL SULFATE (2.5 MG/3ML) 0.083% IN NEBU: 3.0000 mL | INHALATION_SOLUTION | Freq: Four times a day (QID) | RESPIRATORY_TRACT | Status: DC | PRN

## 2020-10-31 MED ORDER — MINERAL OIL PO OIL
TOPICAL_OIL | Freq: Once | ORAL | Status: DC
  Filled 2020-10-31: qty 30

## 2020-10-31 MED ORDER — DIPHENHYDRAMINE HCL 25 MG PO CAPS: 25.0000 mg | ORAL_CAPSULE | Freq: Four times a day (QID) | ORAL | Status: DC | PRN

## 2020-10-31 MED ORDER — PHENYLEPHRINE 40 MCG/ML (10ML) SYRINGE FOR IV PUSH (FOR BLOOD PRESSURE SUPPORT)
80.0000 ug | PREFILLED_SYRINGE | INTRAVENOUS | Status: DC | PRN
  Filled 2020-10-31: qty 10

## 2020-10-31 MED ORDER — ACETAMINOPHEN 325 MG PO TABS
650.0000 mg | ORAL_TABLET | ORAL | Status: DC | PRN
  Administered 2020-11-01 – 2020-11-02 (×3): 650 mg via ORAL
  Filled 2020-10-31 (×3): qty 2

## 2020-10-31 MED ORDER — LIDOCAINE HCL (PF) 1 % IJ SOLN: 30.0000 mL | INTRAMUSCULAR | Status: DC | PRN

## 2020-10-31 MED ORDER — LIDOCAINE-EPINEPHRINE (PF) 2 %-1:200000 IJ SOLN
INTRAMUSCULAR | Status: DC | PRN
  Administered 2020-10-31: 5 mL via EPIDURAL

## 2020-10-31 MED ORDER — EPHEDRINE 5 MG/ML INJ: 10.0000 mg | INTRAVENOUS | Status: DC | PRN

## 2020-10-31 MED ORDER — TETANUS-DIPHTH-ACELL PERTUSSIS 5-2.5-18.5 LF-MCG/0.5 IM SUSY: 0.5000 mL | PREFILLED_SYRINGE | Freq: Once | INTRAMUSCULAR | Status: DC

## 2020-10-31 MED ORDER — TERBUTALINE SULFATE 1 MG/ML IJ SOLN: 0.2500 mg | Freq: Once | INTRAMUSCULAR | Status: DC | PRN

## 2020-10-31 MED ORDER — IBUPROFEN 600 MG PO TABS
600.0000 mg | ORAL_TABLET | Freq: Four times a day (QID) | ORAL | Status: DC
  Administered 2020-11-01 – 2020-11-02 (×7): 600 mg via ORAL
  Filled 2020-10-31 (×7): qty 1

## 2020-10-31 MED ORDER — DIPHENHYDRAMINE HCL 50 MG/ML IJ SOLN: 12.5000 mg | INTRAMUSCULAR | Status: DC | PRN

## 2020-10-31 MED ORDER — LACTATED RINGERS IV SOLN: INTRAVENOUS | Status: DC

## 2020-10-31 MED ORDER — MINERAL OIL LIGHT OIL
TOPICAL_OIL | Freq: Once | Status: DC
  Filled 2020-10-31: qty 10

## 2020-10-31 MED ORDER — WITCH HAZEL-GLYCERIN EX PADS: 1.0000 "application " | MEDICATED_PAD | CUTANEOUS | Status: DC | PRN

## 2020-10-31 MED ORDER — OXYTOCIN BOLUS FROM INFUSION: 333.0000 mL | Freq: Once | INTRAVENOUS | Status: DC

## 2020-10-31 MED ORDER — SOD CITRATE-CITRIC ACID 500-334 MG/5ML PO SOLN: 30.0000 mL | ORAL | Status: DC | PRN

## 2020-10-31 MED ORDER — COCONUT OIL OIL: 1.0000 "application " | TOPICAL_OIL | Status: DC | PRN

## 2020-10-31 MED ORDER — PHENYLEPHRINE 40 MCG/ML (10ML) SYRINGE FOR IV PUSH (FOR BLOOD PRESSURE SUPPORT): 80.0000 ug | PREFILLED_SYRINGE | INTRAVENOUS | Status: DC | PRN

## 2020-10-31 MED ORDER — LACTATED RINGERS IV SOLN
500.0000 mL | Freq: Once | INTRAVENOUS | Status: AC
  Administered 2020-10-31: 500 mL via INTRAVENOUS

## 2020-10-31 MED ORDER — ZOLPIDEM TARTRATE 5 MG PO TABS: 5.0000 mg | ORAL_TABLET | Freq: Every evening | ORAL | Status: DC | PRN

## 2020-10-31 MED ORDER — OXYTOCIN-SODIUM CHLORIDE 30-0.9 UT/500ML-% IV SOLN
1.0000 m[IU]/min | INTRAVENOUS | Status: DC
  Administered 2020-10-31: 2 m[IU]/min via INTRAVENOUS
  Filled 2020-10-31: qty 500

## 2020-10-31 MED ORDER — FENTANYL CITRATE (PF) 100 MCG/2ML IJ SOLN: 50.0000 ug | INTRAMUSCULAR | Status: DC | PRN

## 2020-10-31 MED ORDER — SENNOSIDES-DOCUSATE SODIUM 8.6-50 MG PO TABS
2.0000 | ORAL_TABLET | ORAL | Status: DC
  Administered 2020-11-01 – 2020-11-02 (×2): 2 via ORAL
  Filled 2020-10-31 (×2): qty 2

## 2020-10-31 MED ORDER — BENZOCAINE-MENTHOL 20-0.5 % EX AERO: 1.0000 "application " | INHALATION_SPRAY | CUTANEOUS | Status: DC | PRN

## 2020-10-31 MED ORDER — SIMETHICONE 80 MG PO CHEW: 80.0000 mg | CHEWABLE_TABLET | ORAL | Status: DC | PRN

## 2020-10-31 MED ORDER — DIBUCAINE (PERIANAL) 1 % EX OINT: 1.0000 "application " | TOPICAL_OINTMENT | CUTANEOUS | Status: DC | PRN

## 2020-10-31 MED ORDER — MOMETASONE FURO-FORMOTEROL FUM 100-5 MCG/ACT IN AERO
2.0000 | INHALATION_SPRAY | Freq: Two times a day (BID) | RESPIRATORY_TRACT | Status: DC
  Filled 2020-10-31: qty 8.8

## 2020-10-31 MED ORDER — ONDANSETRON HCL 4 MG/2ML IJ SOLN: 4.0000 mg | Freq: Four times a day (QID) | INTRAMUSCULAR | Status: DC | PRN

## 2020-10-31 MED ORDER — LACTATED RINGERS IV SOLN: 500.0000 mL | INTRAVENOUS | Status: DC | PRN

## 2020-10-31 MED ORDER — ONDANSETRON HCL 4 MG PO TABS: 4.0000 mg | ORAL_TABLET | ORAL | Status: DC | PRN

## 2020-10-31 MED ORDER — ONDANSETRON HCL 4 MG/2ML IJ SOLN: 4.0000 mg | INTRAMUSCULAR | Status: DC | PRN

## 2020-10-31 NOTE — H&P (Signed)
Melissa [name] is a 34 y.o. female, G4P3003, IUP at 38.1 weeks, presenting for IOL for CHTN. Pt endorse + Fm. Denies vaginal leakage. Denies vaginal bleeding.  10/21/20 growth 7.0, anterior placenta, 10/28/20 BPP 8/8. GBS negative.   Prenatal Medical Problem:  Anemia of pregnancy (Hgb 10.4 at 27 weeks, rx'd Fe) Asthma (On Pulmocort and Dulera) Atypical squamous cells of undetermined significance (Repeat 1 year) Benign essential hypertension (Hx of taking Labetalol, amlodipine. Medication stopped about 6 monts before current pregnancy as her blood pressures normalized) Cardiac catheterization (Cardiology: Dr. Tenny Craw of [facility]. Catheterization done for chest pain and EKG findings in Feb 2020. Catheterization was normal) Chronic pain (Hx of pain seen at pain clinic) Decreased vitamin K (Vit d 04/25/20: 10.8, advised usage of 4000 iu Po vitamin d daily) Genital herpes simplex (Type 2 by titer, no prodromal s/sx, no lesion, not on valtrex) History of cerebrovascular accident without residual deficits (? CVA age 69 in 2011) History of pre-eclampsia (10 days pp after second pregnancy in 2014 had preeclampsia. Per patient she also had a stroke then, review of her records shows her MRI was negative) Postpartum depression (Hx therapy after last delivery 2015, no meds currently) Hyperemesis gravidarum (Controlled by steroids.) Alcohol intoxication (In first trimester before finding out she was pregnant)  Patient Active Problem List   Diagnosis Date Noted   Chronic hypertension 10/31/2020   Preterm labor in third trimester 10/12/2020   Anemia of pregnancy 08/18/2020   Atypical squamous cells of undetermined significance (ASC-US) on cervical Pap smear 08/10/2020   Hyperemesis gravidarum with dehydration 05/16/2020   Genital herpes simplex 04/15/2020   Acute pericarditis 02/18/2019   Acute hypoxemic respiratory failure (HCC) 02/07/2019   Mild persistent chronic asthma without complication  09/22/2015   Laryngopharyngeal reflux (LPR)    Physical deconditioning    Chronic hypertension complicating or reason for care during pregnancy 11/26/2014   Chronic pain syndrome 11/26/2014   Obesity (BMI 30-39.9) 11/26/2014   History of pre-eclampsia 11/26/2014   History of CVA (cerebrovascular accident) 04/09/2013   Sickle cell trait (HCC) 02/16/2013   Severe persistent asthma with allergic rhinitis with status asthmaticus 02/16/2013   Migraines 02/16/2013     Medications Prior to Admission  Medication Sig Dispense Refill Last Dose   acetaminophen (TYLENOL) 325 MG tablet Take 2 tablets (650 mg total) by mouth every 4 (four) hours as needed (for pain scale < 4  OR  temperature  >/=  100.5 F). 30 tablet 0    cyclobenzaprine (FLEXERIL) 10 MG tablet Take 10 mg by mouth 3 (three) times daily.      fluticasone (FLONASE) 50 MCG/ACT nasal spray Place 2 sprays into both nostrils daily.      Misc. Devices MISC Dispense one maternity belt for patient 1 each 0    Prenatal Vit-Fe Fumarate-FA (MULTIVITAMIN-PRENATAL) 27-0.8 MG TABS tablet Take 1 tablet by mouth daily at 12 noon. 30 tablet 0     Past Medical History:  Diagnosis Date   Abnormal Pap smear 2010   Repeat pap done;Hysteroscopy;Last pap 2012;was normal   Anemia 2010   Iron supplements PP   Asthma    Has a nebulizer;triggered by stress   Chest pain    Colon polyps    Headache(784.0)    Heart attack [facility])    MD thinks may have had a heart attack;Cardiologist @ [facility]   History of CVA (cerebrovascular accident) 04/09/2013   Pt reports presumptive "stroke" when she was 34y.o.; followed by [facility] neurology for "  chronic nerve pain" and has been Rx'd Hydrocodone and Mobic   Hypertension    Infection    BV;may get frequently, UTI   Multiple food allergies    Nerve pain    Chronic;d/t possible stroke and MI   Placenta previa antepartum 2010   Placenta moved 3 days before baby was born   Pre-diabetes     Shortness of breath    Sickle cell trait (HCC)    Stroke [facility]) 2013   MD thought may have had a stroke and heart attack;has neurologist @ [facility]   Vitamin D deficiency      No current facility-administered medications on file prior to encounter.   Current Outpatient Medications on File Prior to Encounter  Medication Sig Dispense Refill   acetaminophen (TYLENOL) 325 MG tablet Take 2 tablets (650 mg total) by mouth every 4 (four) hours as needed (for pain scale < 4  OR  temperature  >/=  100.5 F). 30 tablet 0   cyclobenzaprine (FLEXERIL) 10 MG tablet Take 10 mg by mouth 3 (three) times daily.     fluticasone (FLONASE) 50 MCG/ACT nasal spray Place 2 sprays into both nostrils daily.     Misc. Devices MISC Dispense one maternity belt for patient 1 each 0   Prenatal Vit-Fe Fumarate-FA (MULTIVITAMIN-PRENATAL) 27-0.8 MG TABS tablet Take 1 tablet by mouth daily at 12 noon. 30 tablet 0     Allergies  Allergen Reactions   Ginger Anaphylaxis    History of present pregnancy: Pt Info/Preference:  Screening/Consents:  Labs:   EDD: Estimated Date of Delivery: 11/13/20  Establised: Patient's last menstrual period was 02/19/2020.  Anatomy Scan: Date: 07/19/2020 Placenta Location: anterior Genetic Screen: Panoroma:Declined AFP:  First Tri: Quad:  Office: [facility]             First PNV: 14.2 wg Blood Type --/--/O POS (11/01 0857)  Language: english Last PNV: 35.3 wg Rhogam  N/A  Flu Vaccine:  Declined   Antibody NEG (11/01 0857)  TDaP vaccine UTD   GTT: Early: 5.3 hga1c Third Trimester: 86  Feeding Plan: Breast/bottle BTL: no Rubella:  Immune  Contraception: ??? VBAC: no RPR: NON REACTIVE (10/13 2122) NR  Circumcision: ???   HBsAg:  NEG  Pediatrician:  ???   HIV:   NR  Prenatal Classes: no Additional [place]: 10/21/20 growth 7.0, anterior placenta, 10/28/20 BPP 8/8 GBS: NEGATIVE/-- (10/13 2131)Unknown(For PCN allergy, check sensitivities)       Chlamydia: neg    MFM  Referral/Consult:  GC: neg  Support Person: partner   PAP: 2020 ASUCS, 2021 WNL  Pain Management: epidural Neonatologist Referral:  Hgb Electrophoresis:  AA  Birth Plan: DCC   Hgb NOB: 13    28W: 10.4   OB History    Gravida  4   Para  3   Term  3   Preterm      AB      Living  3     SAB      TAB      Ectopic      Multiple  0   Live Births  3          Past Medical History:  Diagnosis Date   Abnormal Pap smear 2010   Repeat pap done;Hysteroscopy;Last pap 2012;was normal   Anemia 2010   Iron supplements PP   Asthma    Has a nebulizer;triggered by stress   Chest pain    Colon polyps    Headache(784.0)  Heart attack [facility])    MD thinks may have had a heart attack;Cardiologist @ [facility]   History of CVA (cerebrovascular accident) 04/09/2013   Pt reports presumptive "stroke" when she was 34y.o.; followed by [facility] neurology for "chronic nerve pain" and has been Rx'd Hydrocodone and Mobic   Hypertension    Infection    BV;may get frequently, UTI   Multiple food allergies    Nerve pain    Chronic;d/t possible stroke and MI   Placenta previa antepartum 2010   Placenta moved 3 days before baby was born   Pre-diabetes    Shortness of breath    Sickle cell trait (HCC)    Stroke [facility]) 2013   MD thought may have had a stroke and heart attack;has neurologist @ [facility]   Vitamin D deficiency    Past Surgical History:  Procedure Laterality Date   HYSTEROSCOPY  2010   LEFT HEART CATH AND CORONARY ANGIOGRAPHY N/A 02/18/2019   Procedure: LEFT HEART CATH AND CORONARY ANGIOGRAPHY;  Surgeon: [name] Lex, MD;  Location: [facility] INVASIVE CV LAB;  Service: Cardiovascular;  Laterality: N/A;   Family History: family history includes Anemia in her mother; Asthma in her brother, daughter, mother, and son; Cervical cancer in her paternal aunt and paternal grandmother; Diabetes in her mother, paternal aunt, paternal grandfather, and paternal  grandmother; Hypertension in her mother; Sickle cell trait in her father. Social History:  reports that she has never smoked. She has never used smokeless tobacco. She reports previous alcohol use. She reports that she does not use drugs.   Prenatal Transfer Tool  Maternal Diabetes: No Genetic Screening: Normal Maternal Ultrasounds/Referrals: Normal Fetal Ultrasounds or other Referrals:  None Maternal Substance Abuse:  No Alcohol prior to knowing she was pregnant.  Significant Maternal Medications:  None dulera, Pulmicort, steriods during pregnancy Significant Maternal Lab Results: Other: Unknown.   ROS:  Review of Systems  Constitutional: Negative.   HENT: Negative.   Eyes: Negative.   Respiratory: Negative.   Cardiovascular: Negative.   Gastrointestinal: Positive for abdominal pain.  Genitourinary: Negative.   Musculoskeletal: Negative.   Skin: Negative.   Neurological: Negative.   Endo/Heme/Allergies: Negative.   Psychiatric/Behavioral: Negative.      Physical Exam: BP 119/77    Pulse 84    Temp 98.2 F (36.8 C) (Oral)    Resp 18    Ht 5\' 5"  (1.651 m)    Wt 88.2 kg    LMP 02/19/2020    BMI 32.37 kg/m   Physical Exam Vitals and nursing note reviewed. Exam conducted with a chaperone present.  Constitutional:      Appearance: Normal appearance.  HENT:     Head: Normocephalic and atraumatic.     Nose: Nose normal.     Mouth/Throat:     Mouth: Mucous membranes are moist.  Eyes:     Conjunctiva/sclera: Conjunctivae normal.     Pupils: Pupils are equal, round, and reactive to light.  Cardiovascular:     Rate and Rhythm: Normal rate and regular rhythm.     Pulses: Normal pulses.     Heart sounds: Normal heart sounds.  Pulmonary:     Effort: Pulmonary effort is normal.     Breath sounds: Normal breath sounds.  Abdominal:     General: Bowel sounds are normal.  Genitourinary:    General: Normal vulva.     Rectum: Normal.     Comments: No lesion noted. Pelvis  adaqaute Musculoskeletal:  General: Normal range of motion.     Cervical back: Normal range of motion and neck supple.  Skin:    General: Skin is warm.     Capillary Refill: Capillary refill takes less than 2 seconds.  Neurological:     General: No focal deficit present.     Mental Status: She is alert.  Psychiatric:        Mood and Affect: Mood normal.      NST: FHR baseline 130 bpm, Variability: moderate, Accelerations:present, Decelerations:  Absent= Cat 1/Reactive UC:   none SVE: 4/80/-2 Dilation: 3 Effacement (%): 60 Station: -2 Exam by:: RN  Leopold's: Position vertex, EFW 7 lbs via leopold's.  Pelvis proven to 8.10lbs  Labs: Results for orders placed or performed during the hospital encounter of 10/31/20 (from the past 24 hour(s))  CBC     Status: Abnormal   Collection Time: 10/31/20  7:54 AM  Result Value Ref Range   WBC 5.8 4.0 - 10.5 K/uL   RBC 4.14 3.87 - 5.11 MIL/uL   Hemoglobin 10.1 (L) 12.0 - 15.0 g/dL   HCT 81.0 (L) 36 - 46 %   MCV 80.7 80.0 - 100.0 fL   MCH 24.4 (L) 26.0 - 34.0 pg   MCHC 30.2 30.0 - 36.0 g/dL   RDW 17.5 (H) 10.2 - 58.5 %   Platelets 180 150 - 400 K/uL   nRBC 0.0 0.0 - 0.2 %  Comprehensive metabolic panel     Status: Abnormal   Collection Time: 10/31/20  7:54 AM  Result Value Ref Range   Sodium 137 135 - 145 mmol/L   Potassium 3.4 (L) 3.5 - 5.1 mmol/L   Chloride 108 98 - 111 mmol/L   CO2 19 (L) 22 - 32 mmol/L   Glucose, Bld 87 70 - 99 mg/dL   BUN <5 (L) 6 - 20 mg/dL   Creatinine, Ser 2.77 0.44 - 1.00 mg/dL   Calcium 8.9 8.9 - 82.4 mg/dL   Total Protein 6.6 6.5 - 8.1 g/dL   Albumin 2.8 (L) 3.5 - 5.0 g/dL   AST 15 15 - 41 U/L   ALT 8 0 - 44 U/L   Alkaline Phosphatase 126 38 - 126 U/L   Total Bilirubin 1.0 0.3 - 1.2 mg/dL   GFR, Estimated >23 >53 mL/min   Anion gap 10 5 - 15  Type and screen     Status: None   Collection Time: 10/31/20  8:57 AM  Result Value Ref Range   ABO/RH(D) O POS    Antibody Screen NEG    Sample  Expiration      11/03/2020,2359 Performed at [facility], [address], [place], [place] [address]     Imaging:  No results found.  MAU Course: Orders Placed This Encounter  Procedures   CBC   Comprehensive metabolic panel   RPR   Diet clear liquid Room service appropriate? Yes; Fluid consistency: Thin   Discontinue Pitocin if tachysystole with non-reassuring FHR is present   Evaluate fetal heart rate to establish reassuring pattern prior to initiating Cytotec or Pitocin   If tachysystole WITH reassuring FHR present notify MD / CNM   Initiate intrauterine resuscitation if tachysystole with non-reasuring FHR is present   Labor Induction   May administer Terbutaline 0.25 mg SQ x 1 dose if tachysystole with non-reassuring FHR is presesnt   Nofify MD/CNM if tachysystole with non-reassuring FHR is present   Perform a cervical exam prior to initiating Cytotec or  Pitocin   Activity as tolerated   Fetal monitoring per unit policy   Notify Physician   Vitals signs per unit policy   Order Rapid HIV per protocol if no results on chart   Discontinue foley prior to vaginal delivery   Fundal check post delivery every 15 min x 1 hour then every 30 min x 1 hour   If Rapid HIV test positive or known HIV positive: initiate AZT orders   Initiate Carrier Fluid Protocol   Initiate Oral Care Protocol   Insert foley catheter   May in and out cath x 2 for inability to void   Measure blood pressure post delivery every 15 min x 1 hour then every 30 min x 1 hour   Patient may have epidural placement upon request   May use local infiltration of 1% lidocaine plain to produce a skin wheal prior to IV insertion   Notify in-house Anesthesia team of nausea and vomiting greater than 5 hours   Assess for signs/symptoms of PIH/preeclampsia   RN to place order for: CBC if one has not been drawn in the past 6 hours for all patients with hypertensive disease, pre-eclampsia,  eclampsia, thrombocytopenia or previous PLTC<150,000.   Identify to Anesthesia if patient plans to have postpartum tubal ligation; do not remove epidural without discussion with Anesthesiologist   Vital signs following Epidural Placement, re-bolus or re-dose monitor patient's BP and oxygen saturation every 5 minutes for 30 minutes   RN to remain at bedside continuously for 30 minutes post epidural placement, post re-bolus / re-dose   Notify Anesthesia if the patient becomes short of breath or complains of heaviness in chest, chest pain, and/or unrelieved pain   Notify Anesthesia prior to discontinuing epidural infusion   Full code   Type and screen   Insert and maintain IV Line   Admit to Inpatient (patient's expected length of stay will be greater than 2 midnights or inpatient only procedure)   Meds ordered this encounter  Medications   oxytocin (PITOCIN) IV infusion 30 units in NS 500 mL - Premix    Order Specific Question:   Begin infusion at:    Answer:   2 milli-units/min (2 mL/hr)    Order Specific Question:   Increase infusion by:    Answer:   2 milli-units/min (2 mL/hr)   terbutaline (BRETHINE) injection 0.25 mg   lactated ringers infusion   lactated ringers infusion 500-1,000 mL   oxytocin (PITOCIN) IV BOLUS FROM BAG   oxytocin (PITOCIN) IV infusion 30 units in NS 500 mL - Premix   acetaminophen (TYLENOL) tablet 650 mg   fentaNYL (SUBLIMAZE) injection 50-100 mcg   lidocaine (PF) (XYLOCAINE) 1 % injection 30 mL   ondansetron (ZOFRAN) injection 4 mg   sodium citrate-citric acid (ORACIT) solution 30 mL   ePHEDrine injection 10 mg   PHENYLephrine 40 mcg/ml in normal saline Adult IV Push Syringe (For Blood Pressure Support)   lactated ringers infusion 500 mL   fentaNYL 2 mcg/mL w/ bupivacaine 0.125% in NS 250 mL epidural infusion ([facility])   diphenhydrAMINE (BENADRYL) injection 12.5 mg   ePHEDrine injection 10 mg   PHENYLephrine 40 mcg/ml in normal  saline Adult IV Push Syringe (For Blood Pressure Support)    Assessment/Plan: Melissa [name] is a 34 y.o. female, G4P3003, IUP at 38.1 weeks, presenting for IOL for CHTN. Pt endorse + Fm. Denies vaginal leakage. Denies vaginal bleeding.  10/21/20 growth 7.0, anterior placenta, 10/28/20 BPP 8/8. GBS negative.  Prenatal Medical Problem:  Anemia of pregnancy (Hgb 10.4 at 27 weeks, rx'd Fe) Asthma (On Pulmocort and Dulera) Atypical squamous cells of undetermined significance (Repeat 1 year) Benign essential hypertension (Hx of taking Labetalol, amlodipine. Medication stopped about 6 monts before current pregnancy as her blood pressures normalized) Cardiac catheterization (Cardiology: Dr. Tenny Craw of [facility]. Catheterization done for chest pain and EKG findings in Feb 2020. Catheterization was normal) Chronic pain (Hx of pain seen at pain clinic) Decreased vitamin K (Vit d 04/25/20: 10.8, advised usage of 4000 iu Po vitamin d daily) Genital herpes simplex (Type 2 by titer, no prodromal s/sx, no lesion, not on valtrex) History of cerebrovascular accident without residual deficits (? CVA age 89 in 2011) History of pre-eclampsia (10 days pp after second pregnancy in 2014 had preeclampsia. Per patient she also had a stroke then, review of her records shows her MRI was negative) Postpartum depression (Hx therapy after last delivery 2015, no meds currently) Hyperemesis gravidarum (Controlled by steroids.) Alcohol intoxication (In first trimester before finding out she was pregnant)  FWB: Cat 1 Fetal Tracing.   Plan: Admit to Birthing Suite per consult with DR Mora Appl Routine [facility] orders IOL - AROM/ PIT Pain med/epidural prn Expectant management.  GBS - negative Anticipate labor progression  Hx BMZ X 2 H/O PreE and HTN/CVA: Pending cmp and cbc, monitor BP. H/O PPD: Monitor Mood, recommend zoloft PP   Gerhard Munch Tatum Corl NP-C, CNM, MSN 10/31/2020, 11:27 AM

## 2020-10-31 NOTE — Anesthesia Preprocedure Evaluation (Addendum)
Anesthesia Evaluation  Patient identified by MRN, date of birth, ID band Patient awake    Reviewed: Allergy & Precautions, NPO status , Patient's Chart, lab work & pertinent test results  Airway Mallampati: II  TM Distance: >3 FB Neck ROM: Full    Dental no notable dental hx.    Pulmonary asthma ,    Pulmonary exam normal breath sounds clear to auscultation       Cardiovascular hypertension, Pt. on medications + Past MI ([facility] unremarkable 01/2019)  Normal cardiovascular exam Rhythm:Regular Rate:Normal     Neuro/Psych  Headaches, CVA (per pt, residual left sided neuropathic paid and weakness affecting mostly left leg), Residual Symptoms negative psych ROS   GI/Hepatic negative GI ROS, Neg liver ROS,   Endo/Other  negative endocrine ROS  Renal/GU negative Renal ROS  negative genitourinary   Musculoskeletal negative musculoskeletal ROS (+)   Abdominal   Peds  Hematology  (+) Blood dyscrasia, Sickle cell trait and anemia ,   Anesthesia Other Findings IOL for cHTN  Reproductive/Obstetrics (+) Pregnancy                           Anesthesia Physical Anesthesia Plan  ASA: III  Anesthesia Plan: Epidural   Post-op Pain Management:    Induction:   PONV Risk Score and Plan: Treatment may vary due to age or medical condition  Airway Management Planned: Natural Airway  Additional Equipment:   Intra-op Plan:   Post-operative Plan:   Informed Consent: I have reviewed the patients History and Physical, chart, labs and discussed the procedure including the risks, benefits and alternatives for the proposed anesthesia with the patient or authorized representative who has indicated his/her understanding and acceptance.       Plan Discussed with: Anesthesiologist  Anesthesia Plan Comments: (Patient identified. Risks, benefits, options discussed with patient including but not limited to bleeding,  infection, nerve damage, paralysis, failed block, incomplete pain control, headache, blood pressure changes, nausea, vomiting, reactions to medication, itching, and post partum back pain. Confirmed with bedside nurse the patient's most recent platelet count. Confirmed with the patient that they are not taking any anticoagulation, have any bleeding history or any family history of bleeding disorders. Patient expressed understanding and wishes to proceed. All questions were answered. )        Anesthesia Quick Evaluation

## 2020-10-31 NOTE — Anesthesia Procedure Notes (Signed)
Epidural Patient location during procedure: OB Start time: 10/31/2020 12:31 PM End time: 10/31/2020 12:41 PM  Staffing Anesthesiologist: Elmer Picker, MD Performed: anesthesiologist   Preanesthetic Checklist Completed: patient identified, IV checked, risks and benefits discussed, monitors and equipment checked, pre-op evaluation and timeout performed  Epidural Patient position: sitting Prep: DuraPrep and site prepped and draped Patient monitoring: continuous pulse ox, blood pressure, heart rate and cardiac monitor Approach: midline Location: L3-L4 Injection technique: LOR air  Needle:  Needle type: Tuohy  Needle gauge: 17 G Needle length: 9 cm Needle insertion depth: 5 cm Catheter type: closed end flexible Catheter size: 19 Gauge Catheter at skin depth: 11 cm Test dose: negative  Assessment Sensory level: T8 Events: blood not aspirated, injection not painful, no injection resistance, no paresthesia and negative IV test  Additional Notes Patient identified. Risks/Benefits/Options discussed with patient including but not limited to bleeding, infection, nerve damage, paralysis, failed block, incomplete pain control, headache, blood pressure changes, nausea, vomiting, reactions to medication both or allergic, itching and postpartum back pain. Confirmed with bedside nurse the patient's most recent platelet count. Confirmed with patient that they are not currently taking any anticoagulation, have any bleeding history or any family history of bleeding disorders. Patient expressed understanding and wished to proceed. All questions were answered. Sterile technique was used throughout the entire procedure. Please see nursing notes for vital signs. Test dose was given through epidural catheter and negative prior to continuing to dose epidural or start infusion. Warning signs of high block given to the patient including shortness of breath, tingling/numbness in hands, complete motor block,  or any concerning symptoms with instructions to call for help. Patient was given instructions on fall risk and not to get out of bed. All questions and concerns addressed with instructions to call with any issues or inadequate analgesia.  Reason for block:procedure for pain

## 2020-10-31 NOTE — Progress Notes (Addendum)
Labor Progress Note  Melissa [name], 34 y.o., [phone], with an IUP @ [redacted]w[redacted]d, presented for IOL for CHTN  Subjective: Pt stable. In NAD with support system at [facility]. Reviewed R/B/[name] of AROM and Pitocin. Pt verbalized consent for both. Pt plans for epidural once ctx regular.  Patient Active Problem List   Diagnosis Date Noted  . Chronic hypertension 10/31/2020  . Preterm labor in third trimester 10/12/2020  . Anemia of pregnancy 08/18/2020  . Atypical squamous cells of undetermined significance (ASC-US) on cervical Pap smear 08/10/2020  . Hyperemesis gravidarum with dehydration 05/16/2020  . Genital herpes simplex 04/15/2020  . Acute pericarditis 02/18/2019  . Acute hypoxemic respiratory failure (HCC) 02/07/2019  . Mild persistent chronic asthma without complication 09/22/2015  . Laryngopharyngeal reflux (LPR)   . Physical deconditioning   . Chronic hypertension complicating or reason for care during pregnancy 11/26/2014  . Chronic pain syndrome 11/26/2014  . Obesity (BMI 30-39.9) 11/26/2014  . History of pre-eclampsia 11/26/2014  . History of CVA (cerebrovascular accident) 04/09/2013  . Sickle cell trait (HCC) 02/16/2013  . Severe persistent asthma with allergic rhinitis with status asthmaticus 02/16/2013  . Migraines 02/16/2013   Objective: BP 119/77   Pulse 84   Temp 98.2 F (36.8 C) (Oral)   Resp 18   Ht 5\' 5"  (1.651 m)   Wt 88.2 kg   LMP 02/19/2020   BMI 32.37 kg/m  No intake/output data recorded. No intake/output data recorded. NST: FHR baseline 130 bpm, Variability: moderate, Accelerations:present, Decelerations: negative  Cat  1/Reactive CTX:  regular, every 3-4 minutes Uterus gravid, soft non tender, moderate to palpate with contractions.  SVE:  Dilation: 4 Effacement (%): 80 Station: -2 Exam by:: Lakiesha Ralphs, CNM Pitocin at 13mUn/min AROM - clear, tolerated well  Assessment:  Melissa [name] 4m, 34 y.o., [phone], with an IUP @ [redacted]w[redacted]d, presented for IOL for CHTN.  Progressing in latent labor with pitocin and AROM Patient Active Problem List   Diagnosis Date Noted  . Chronic hypertension 10/31/2020  . Preterm labor in third trimester 10/12/2020  . Anemia of pregnancy 08/18/2020  . Atypical squamous cells of undetermined significance (ASC-US) on cervical Pap smear 08/10/2020  . Hyperemesis gravidarum with dehydration 05/16/2020  . Genital herpes simplex 04/15/2020  . Acute pericarditis 02/18/2019  . Acute hypoxemic respiratory failure (HCC) 02/07/2019  . Mild persistent chronic asthma without complication 09/22/2015  . Laryngopharyngeal reflux (LPR)   . Physical deconditioning   . Chronic hypertension complicating or reason for care during pregnancy 11/26/2014  . Chronic pain syndrome 11/26/2014  . Obesity (BMI 30-39.9) 11/26/2014  . History of pre-eclampsia 11/26/2014  . History of CVA (cerebrovascular accident) 04/09/2013  . Sickle cell trait (HCC) 02/16/2013  . Severe persistent asthma with allergic rhinitis with status asthmaticus 02/16/2013  . Migraines 02/16/2013   IOL d/t CHTN NICHD: Category 1 Membranes:  AROM, clear @ 1105, no s/s of infection Vertex by leopolds and SVE  Induction:    Pitocin - 9mu  Pain management:               IV pain management: none             Epidural placement: none  GBS Negative  Plan: Continue labor plan Continuous monitoring Rest Ambulate Frequent position changes to facilitate fetal rotation and descent. Will reassess with cervical exam at 1345 or earlier if necessary Continue pitocin per protocol 2x2 Anticipate labor progression and vaginal delivery.   Dr 4m aware of plan and verbalized agreement.  Johney Frame, CNM, MSN 10/31/2020. 11:31 AM

## 2020-11-01 DIAGNOSIS — Z8659 Personal history of other mental and behavioral disorders: Secondary | ICD-10-CM

## 2020-11-01 LAB — CBC
HCT: 32.1 % — ABNORMAL LOW (ref 36.0–46.0)
Hemoglobin: 9.8 g/dL — ABNORMAL LOW (ref 12.0–15.0)
MCH: 24.8 pg — ABNORMAL LOW (ref 26.0–34.0)
MCHC: 30.5 g/dL (ref 30.0–36.0)
MCV: 81.3 fL (ref 80.0–100.0)
Platelets: 182 10*3/uL (ref 150–400)
RBC: 3.95 MIL/uL (ref 3.87–5.11)
RDW: 15.9 % — ABNORMAL HIGH (ref 11.5–15.5)
WBC: 9 10*3/uL (ref 4.0–10.5)
nRBC: 0 % (ref 0.0–0.2)

## 2020-11-01 MED ORDER — POLYSACCHARIDE IRON COMPLEX 150 MG PO CAPS
150.0000 mg | ORAL_CAPSULE | Freq: Every day | ORAL | Status: DC
  Administered 2020-11-01 – 2020-11-02 (×2): 150 mg via ORAL
  Filled 2020-11-01 (×2): qty 1

## 2020-11-01 MED ORDER — MAGNESIUM OXIDE 400 (241.3 MG) MG PO TABS
400.0000 mg | ORAL_TABLET | Freq: Every day | ORAL | Status: DC
  Administered 2020-11-01 – 2020-11-02 (×2): 400 mg via ORAL
  Filled 2020-11-01 (×2): qty 1

## 2020-11-01 NOTE — Progress Notes (Signed)
PPD # 1 S/P NSVD  Live born female  Birth Weight: 7 lb 10 oz (3459 g) APGAR: 9, 9  Newborn Delivery   Birth date/time: 10/31/2020 17:23:00 Delivery type: Vaginal, Spontaneous     Baby name: Melissa [name] Delivering provider: Johney Frame B   Episiotomy:None   Lacerations:None   Feeding: breast  Pain control at delivery: Epidural   Subjective   Reports feeling well. Excited for the pregnancy to be over. Reports bleeding as normal and pain is well controlled with Motrin. Breastfeeding going well. Husband at the bedside.              Tolerating po/ No nausea or vomiting             Bleeding is moderate             Pain controlled with ibuprofen (OTC)             Up ad lib / ambulatory / voiding without difficulties   Objective   A & O x 3, in no apparent distress              VS:  Vitals:   11/01/20 0019 11/01/20 0415 11/01/20 0830 11/01/20 1427  BP: 110/66 108/72 106/73 102/67  Pulse: 71 62 71 71  Resp: 18 18 18 18   Temp: 98.6 F (37 C) 98.1 F (36.7 C) 98.3 F (36.8 C) 98.3 F (36.8 C)  TempSrc: Oral Oral Oral Oral  SpO2:   100% 100%  Weight:      Height:        LABS:  Recent Labs    10/31/20 0754 11/01/20 0614  WBC 5.8 9.0  HGB 10.1* 9.8*  HCT 33.4* 32.1*  PLT 180 182     Blood type: --/--/O POS (11/01 0857)  Rubella:   Immune  Vaccines:   TDaP   UTD                   Flu       Declined                             COVID-19 Desires at discharge   Gen: AAO x 3, NAD  Abdomen: soft, non-tender, non-distended             Fundus: firm, non-tender, U-1  Perineum: intact  Lochia: moderate  Extremities: no edema, no calf pain or tenderness   Assessment/Plan PPD # 1 34 y.o., 01-02-1973   Principal Problem:   Postpartum care following vaginal delivery 11/1 Active Problems:   Genital herpes simplex   Anemia of pregnancy  Hemoglobin 9.8 after delivery  Asymptomatic  Started Niferex and mag oxide   Chronic hypertension  BPs stable   WNL CBC, CMP   SVD  (spontaneous vaginal delivery) 11/1  Doing well - stable status  Routine post partum orders  Breastfeeding support prn   History of depression  Stable off meds  Declines need for medication  Will follow-up at 2 weeks postpartum for a mood check  Anticipate discharge tomorrow.   13/1, MSN, CNM 11/01/2020, 5:16 PM

## 2020-11-01 NOTE — Anesthesia Postprocedure Evaluation (Signed)
Anesthesia Post Note  Patient: Kaniah A Duffett  Procedure(s) Performed: AN AD HOC LABOR EPIDURAL     Patient location during evaluation: Mother Baby Anesthesia Type: Epidural Level of consciousness: awake and alert Pain management: pain level controlled Vital Signs Assessment: post-procedure vital signs reviewed and stable Respiratory status: spontaneous breathing, nonlabored ventilation and respiratory function stable Cardiovascular status: stable Postop Assessment: no headache, no backache and epidural receding Anesthetic complications: no   No complications documented.  Last Vitals:  Vitals:   11/01/20 0019 11/01/20 0415  BP: 110/66 108/72  Pulse: 71 62  Resp: 18 18  Temp: 37 C 36.7 C    Last Pain:  Vitals:   11/01/20 0715  TempSrc:   PainSc: 0-No pain   Pain Goal:                   Junious Silk

## 2020-11-02 ENCOUNTER — Inpatient Hospital Stay: Payer: BC Managed Care – PPO

## 2020-11-02 DIAGNOSIS — Z23 Encounter for immunization: Secondary | ICD-10-CM

## 2020-11-02 MED ORDER — POLYSACCHARIDE IRON COMPLEX 150 MG PO CAPS
150.0000 mg | ORAL_CAPSULE | Freq: Every day | ORAL | 3 refills | Status: DC
Start: 2020-11-03 — End: 2021-07-13

## 2020-11-02 MED ORDER — IBUPROFEN 600 MG PO TABS
600.0000 mg | ORAL_TABLET | Freq: Four times a day (QID) | ORAL | 0 refills | Status: DC
Start: 2020-11-02 — End: 2021-07-13

## 2020-11-02 MED ORDER — ETONOGESTREL 68 MG ~~LOC~~ IMPL
68.0000 mg | DRUG_IMPLANT | Freq: Once | SUBCUTANEOUS | Status: AC
  Administered 2020-11-02: 68 mg via SUBCUTANEOUS
  Filled 2020-11-02: qty 1

## 2020-11-02 MED ORDER — LIDOCAINE HCL 1 % IJ SOLN
0.0000 mL | Freq: Once | INTRAMUSCULAR | Status: AC | PRN
  Administered 2020-11-02: 2 mL via INTRADERMAL
  Filled 2020-11-02: qty 20

## 2020-11-02 MED ORDER — MAGNESIUM OXIDE 400 (241.3 MG) MG PO TABS
400.0000 mg | ORAL_TABLET | Freq: Every day | ORAL | Status: DC
Start: 2020-11-03 — End: 2021-07-13

## 2020-11-02 NOTE — Lactation Note (Signed)
This note was copied from a baby's chart. Lactation Consultation Note  Patient Name: Melissa [name] [id number] Date: 11/02/2020 Reason for consult: Follow-up assessment  P4 mother whose infant is now 86 hours old.  This is an ETI at 38+1 weeks.  Mother's feeding preference is breast/bottle.  Baby was swaddled and asleep in the bassinet when I arrived.  Mother had no questions/concerns related to breast feeding.  Discussed the importance of putting baby to breast first prior to giving any formula supplementation.  Reinforced the importance of stimulating breasts on a regular, consistent manner to help establish and maintain a full milk supply.  Mother verbalized understanding.  She will continue to feed 8-12 times/24 hours or sooner if baby shows feeding cues.  She will call for any questions/concerns she may have after discharge.  Mother has a DEBP for home use.  No support person present at this time.  Mother awaiting birth control from her MD.   Maternal Data    Feeding    LATCH Score                   Interventions    Lactation Tools Discussed/Used     Consult Status Consult Status: Complete    Lasheika Ortloff R Katsumi Wisler 11/02/2020, 11:35 AM

## 2020-11-02 NOTE — Procedures (Signed)
Nexplanon insertion Pts upper arm prepped with betadine.  2cc of 1% buffered lidocaine used for anesthesia.  Nexplanon inserted per protocol.  Pt tolerated the procedure.  Hemostasis noted at the end of procedure.  Nexplanon palpated by myself and the patient. Dr. Normand Sloop present for guidance without insertion.   Rhea Pink, MSN, CNM 11/02/2020 2:32 PM

## 2020-11-02 NOTE — Discharge Summary (Signed)
Postpartum Discharge Summary  Date of Service updated 11/02/20    Patient Name: Melissa [name] DOB: January 03, 1986 MRN: [id number]  Date of admission: 10/31/2020 Delivery date:10/31/2020  Delivering provider: Holli Humbles B  Date of discharge: 11/02/2020  Admitting diagnosis: Chronic hypertension [I10] Intrauterine pregnancy: [redacted]w[redacted]d     Secondary diagnosis:  Principal Problem:   Postpartum care following vaginal delivery 11/1 Active Problems:   Genital herpes simplex   Anemia of pregnancy   Chronic hypertension   SVD (spontaneous vaginal delivery) 11/1   History of depression  Additional problems:  Patient Active Problem List   Diagnosis Date Noted  . History of depression 11/01/2020  . Chronic hypertension 10/31/2020  . SVD (spontaneous vaginal delivery) 11/1 10/31/2020  . Postpartum care following vaginal delivery 11/1 10/31/2020  . Anemia of pregnancy 08/18/2020  . Atypical squamous cells of undetermined significance (ASC-US) on cervical Pap smear 08/10/2020  . Hyperemesis gravidarum with dehydration 05/16/2020  . Genital herpes simplex 04/15/2020  . Acute pericarditis 02/18/2019  . Acute hypoxemic respiratory failure ([place]) 02/07/2019  . Mild persistent chronic asthma without complication 38/09/1750  . Laryngopharyngeal reflux (LPR)   . Chronic hypertension complicating or reason for care during pregnancy 11/26/2014  . Chronic pain syndrome 11/26/2014  . Obesity (BMI 30-39.9) 11/26/2014  . History of pre-eclampsia 11/26/2014  . History of CVA (cerebrovascular accident) 04/09/2013  . Severe persistent asthma with allergic rhinitis with status asthmaticus 02/16/2013  . Migraines 02/16/2013       Discharge diagnosis: Term Pregnancy Delivered                                              Post partum procedures:Nexplanon insertion and COVID vaccine Augmentation: AROM and Pitocin Complications: None  Hospital course: Induction of Labor With Vaginal Delivery   34 y.o.  yo [id number] at [redacted]w[redacted]d was admitted to the hospital 10/31/2020 for induction of labor.  Indication for induction: chronic hypertension.  Patient had an uncomplicated labor course as follows: Membrane Rupture Time/Date: 11:05 AM ,10/31/2020   Delivery Method:Vaginal, Spontaneous  Episiotomy: None  Lacerations:  None  Details of delivery can be found in separate delivery note.  Patient had a routine postpartum course. Patient is discharged home 11/02/20.  Newborn Data: Birth date:10/31/2020  Birth time:5:23 PM  Gender:Female  Living status:Living  Apgars:9 ,9  Weight:3459 g   Magnesium Sulfate received: No BMZ received: Yes Rhophylac:N/A MMR:N/A Transfusion:No  Physical exam  Vitals:   11/01/20 0830 11/01/20 1427 11/01/20 2203 11/02/20 0548  BP: 106/73 102/67 99/69   Pulse: 71 71 81 68  Resp: $Remo'18 18 16 18  'WyKvX$ Temp: 98.3 F (36.8 C) 98.3 F (36.8 C) 98.4 F (36.9 C) 97.7 F (36.5 C)  TempSrc: Oral Oral Oral Oral  SpO2: 100% 100% 100% 100%  Weight:      Height:       General: alert, cooperative and no distress Lochia: appropriate Uterine Fundus: firm Incision: N/A DVT Evaluation: No evidence of DVT seen on physical exam. No cords or calf tenderness. No significant calf/ankle edema. Labs: Lab Results  Component Value Date   WBC 9.0 11/01/2020   HGB 9.8 (L) 11/01/2020   HCT 32.1 (L) 11/01/2020   MCV 81.3 11/01/2020   PLT 182 11/01/2020   CMP Latest Ref Rng & Units 10/31/2020  Glucose 70 - 99 mg/dL 87  BUN 6 - 20  mg/dL <5(L)  Creatinine 0.44 - 1.00 mg/dL 0.54  Sodium 135 - 145 mmol/L 137  Potassium 3.5 - 5.1 mmol/L 3.4(L)  Chloride 98 - 111 mmol/L 108  CO2 22 - 32 mmol/L 19(L)  Calcium 8.9 - 10.3 mg/dL 8.9  Total Protein 6.5 - 8.1 g/dL 6.6  Total Bilirubin 0.3 - 1.2 mg/dL 1.0  Alkaline Phos 38 - 126 U/L 126  AST 15 - 41 U/L 15  ALT 0 - 44 U/L 8   Edinburgh Score: Edinburgh Postnatal Depression Scale Screening Tool 11/02/2020  I have been able to laugh and see the  funny side of things. 0  I have looked forward with enjoyment to things. 0  I have blamed myself unnecessarily when things went wrong. 2  I have been anxious or worried for no good reason. 2  I have felt scared or panicky for no good reason. 1  Things have been getting on top of me. 2  I have been so unhappy that I have had difficulty sleeping. 1  I have felt sad or miserable. 1  I have been so unhappy that I have been crying. 0  The thought of harming myself has occurred to me. 0  Edinburgh Postnatal Depression Scale Total 9      After visit meds:  Allergies as of 11/02/2020      Reactions   Ginger Anaphylaxis      Medication List    STOP taking these medications   acetaminophen 325 MG tablet Commonly known as: TYLENOL   cyclobenzaprine 10 MG tablet Commonly known as: FLEXERIL     TAKE these medications   fluticasone 50 MCG/ACT nasal spray Commonly known as: FLONASE Place 2 sprays into both nostrils daily.   ibuprofen 600 MG tablet Commonly known as: ADVIL Take 1 tablet (600 mg total) by mouth every 6 (six) hours.   iron polysaccharides 150 MG capsule Commonly known as: NIFEREX Take 1 capsule (150 mg total) by mouth daily. Start taking on: November 03, 2020   magnesium oxide 400 (241.3 Mg) MG tablet Commonly known as: MAG-OX Take 1 tablet (400 mg total) by mouth daily. Start taking on: November 03, 2020   Misc. Devices Misc Dispense one maternity belt for patient   multivitamin-prenatal 27-0.8 MG Tabs tablet Take 1 tablet by mouth daily at 12 noon.        Discharge home in stable condition Infant Feeding: Bottle Infant Disposition:home with mother Discharge instruction: per After Visit Summary and Postpartum booklet. Activity: Advance as tolerated. Pelvic rest for 6 weeks.  Diet: routine diet Anticipated Birth Control: PP Nexplanon placed Postpartum Appointment:6 weeks Additional Postpartum F/U: none Future Appointments: Will follow-up with primary  care provider as directed Future Appointments  Date Time Provider [place]  02/27/2021  8:45 AM Minette Brine, FNP [facility]TIMA None   Follow up Visit:  [facility]. Schedule an appointment as soon as possible for a visit in 6 week(s).   Specialty: Obstetrics and Gynecology Contact information: [address] [place] [address] [phone]                  11/02/2020 Arrie Eastern, CNM

## 2020-11-02 NOTE — Progress Notes (Signed)
   Covid-19 Vaccination Clinic  Name:  Melissa [name]    MRN: [id number] DOB: 10-Nov-1986  11/02/2020  Melissa [name] was observed post Covid-19 immunization for 15 minutes without incident. She was provided with Vaccine Information Sheet and instruction to access the V-Safe system.   Melissa [name] was instructed to call 911 with any severe reactions post vaccine: [name] Difficulty breathing  . Swelling of face and throat  . A fast heartbeat  . A bad rash all over body  . Dizziness and weakness   Immunizations Administered    Name Date Dose VIS Date Route   Pfizer COVID-19 Vaccine 11/02/2020 10:50 AM 0.3 mL 10/19/2020 Intramuscular   Manufacturer: [employer], [employer]   Lot: [id number]   NDC: [address]-9

## 2020-12-12 DIAGNOSIS — F53 Postpartum depression: Secondary | ICD-10-CM | POA: Diagnosis not present

## 2021-01-05 DIAGNOSIS — F53 Postpartum depression: Secondary | ICD-10-CM | POA: Diagnosis not present

## 2021-02-27 ENCOUNTER — Encounter: Payer: Self-pay | Admitting: Nurse Practitioner

## 2021-04-12 NOTE — Congregational Nurse Program (Signed)
Dept: [phone]   Congregational Nurse Program Note  Date of Encounter: 04/04/2021   Client and her 4 children are being admitted to the [facility] shelter on today due to being put out of house by her husband.  Client states that they were locked out and police would not allow them back in due to a "civil matter" Client  Was unable to retreive any items including medications for herself or her children. Nurse screened each family member for Covid and all were negative and thereby admitted to shelter.  Client states that she and her children all have asthma/allergies/food sensitivities. Currently she has purchased OTC Zyrtec to reduce symptoms until she can get her insurance to override prescriptions so that pharmacy will refill again prior to actual refill date.  She's waiting for them to call her back. Client reports that her son Olevia Perches L. Hester 10/19/13 has diagnosis of Autism High Functioning; ADHD, ODD; Urinary Incontinence and Food Allergies. He sees both a PCP as well  a Psychiatrist (Dr. Suzanna Obey) with [facility].  One daughter Susette Racer 11/29/2014 has Preliminary diagnosis of Autism; Vitiligo: Asthma; and Urinary Incontinence. Oldest daughter Katheran James 03/11/2009  Has Asthma, Obesity and Oral Allergy symptoms. All of the children are up to date with their immunizations. Mom has had two doses of Covid, no booster.  Mom has informed the [facility] of food allergies. Plan:  Mom to let nurse know status of medications (if she's able to get them). Monitor children closely for asthma symptoms (coughing, difficulty breathing) and get help immediately. Nurse will follow up with in person visit next week.  Michaiah Maiden D. Azucena Kuba MSN, RN Congregational Nurse  [place] [phone]  Past Medical History: Past Medical History:  Diagnosis Date  . Abnormal Pap smear 2010   Repeat pap done;Hysteroscopy;Last pap 2012;was normal  . Anemia 2010   Iron supplements PP  . Asthma     Has a nebulizer;triggered by stress  . Chest pain   . Colon polyps   . Headache(784.0)   . Heart attack [facility])    MD thinks may have had a heart attack;Cardiologist @ [facility]  . History of CVA (cerebrovascular accident) 04/09/2013   Pt reports presumptive "stroke" when she was 35y.o.; followed by [facility] neurology for "chronic nerve pain" and has been Rx'd Hydrocodone and Mobic  . Hypertension   . Infection    BV;may get frequently, UTI  . Multiple food allergies   . Nerve pain    Chronic;d/t possible stroke and MI  . Placenta previa antepartum 2010   Placenta moved 3 days before baby was born  . Pre-diabetes   . Shortness of breath   . Sickle cell trait (HCC)   . Stroke [facility]) 2013   MD thought may have had a stroke and heart attack;has neurologist @ [facility]  . Vitamin D deficiency     Encounter Details:  CNP Questionnaire - 04/04/21 1506      Questionnaire   Do you give verbal consent to treat you today? Yes    Visit Setting Other    Location Patient Served At [facility]    Patient Status Homeless    Medical Provider Yes    Insurance Medicaid    Intervention Counsel;Educate    Housing/Utilities No permanent housing    Transportation --   Client has transportation   Interpersonal Safety Do not feel physically and emotionally safe where you currently live    Food --   No food insecurities  Medication Have medication insecurities    Referrals Medication Assistance    Screening Referrals --   No annual due.   ED Visit Averted Yes    Life-Saving Intervention Made Yes

## 2021-04-18 ENCOUNTER — Telehealth: Payer: Self-pay

## 2021-04-18 ENCOUNTER — Ambulatory Visit (HOSPITAL_COMMUNITY)
Admission: EM | Admit: 2021-04-18 | Discharge: 2021-04-18 | Disposition: A | Discharge status: Home / Self Care | Payer: Medicaid Other | Attending: Family Medicine | Admitting: Family Medicine

## 2021-04-18 ENCOUNTER — Other Ambulatory Visit: Payer: Self-pay

## 2021-04-18 DIAGNOSIS — I10 Essential (primary) hypertension: Secondary | ICD-10-CM | POA: Diagnosis not present

## 2021-04-18 DIAGNOSIS — Z760 Encounter for issue of repeat prescription: Secondary | ICD-10-CM | POA: Diagnosis not present

## 2021-04-18 MED ORDER — AMLODIPINE BESYLATE 5 MG PO TABS: 5.0000 mg | ORAL_TABLET | Freq: Every day | ORAL | 2 refills | Status: DC

## 2021-04-18 NOTE — ED Triage Notes (Signed)
Pt here for HTN... reports congregational nurse took her BP and it was 160/100  BP today in the [facility] is 157/96  Sx include: intermittent headaches, pressure behind left eye  Denies chest pain  Reports she has been off her BP med x1 year.   A&O x4... NAD.[name]. ambulatory

## 2021-04-18 NOTE — Congregational Nurse Program (Signed)
  Dept: [phone]   Congregational Nurse Program Note  Date of Encounter: 04/18/2021   Client just in from work and waiting for children to arrive from school.  Son Alan Mulder having difficulty adjusting to shelter, has been in trouble at school.  Mom has had to go to school and sit with him.  Alan Mulder has several diagnosis of ADHD, ODD but is currently not taking medications due to side effects and his doctor has not seen him in three months.  Mom is looking for another psychiatrist.  Other children doing well.  Client did not get the opportunity to have B/P checked last week as planned. Mom is not getting adequate sleep due to infant teething.  Client states that she was previously taking blood pressure medication but it was stopped over a year ago as it stabilized.  Complaining of continued headache  Now for over a week with some pressure behind left eye. 1550-B/P 120/85 RA; 160/100 LA, rechecked in LA 5 minutes later 170/100. No dizziness or blurred vision. 160/104 LA 150/100 RA  at 1613. Client plans to go to Urgent Care or ER now. Will follow up with client by phone later today.    Lenah Messenger D. [facility] Congregational Nurse [place] [phone] Past Medical History: Past Medical History:  Diagnosis Date  . Abnormal Pap smear 2010   Repeat pap done;Hysteroscopy;Last pap 2012;was normal  . Anemia 2010   Iron supplements PP  . Asthma    Has a nebulizer;triggered by stress  . Chest pain   . Colon polyps   . Headache(784.0)   . Heart attack [facility])    MD thinks may have had a heart attack;Cardiologist @ [facility]  . History of CVA (cerebrovascular accident) 04/09/2013   Pt reports presumptive "stroke" when she was 35y.o.; followed by [facility] neurology for "chronic nerve pain" and has been Rx'd Hydrocodone and Mobic  . Hypertension   . Infection    BV;may get frequently, UTI  . Multiple food allergies   . Nerve pain    Chronic;d/t possible stroke and MI  . Placenta previa antepartum 2010    Placenta moved 3 days before baby was born  . Pre-diabetes   . Shortness of breath   . Sickle cell trait (HCC)   . Stroke [facility]) 2013   MD thought may have had a stroke and heart attack;has neurologist @ [facility]  . Vitamin D deficiency     Encounter Details:  CNP Questionnaire - 04/18/21 1604      Questionnaire   Do you give verbal consent to treat you today? Yes    Visit Setting Other    Location Patient Served At [facility]    Patient Status Homeless    Medical Provider Yes    Insurance Medicaid    Intervention Assess (including screenings);Counsel;Educate    Housing/Utilities No permanent housing    Referrals Urgent Care    Life-Saving Intervention Made Yes

## 2021-04-18 NOTE — Telephone Encounter (Signed)
Client was not at [facility] on 04/11/2021 when nurse was there, so nurse phoned client later that evening after leaving to follow up regarding medications for herself and children.  Client reports that she was able to get refills on Epi pens, Singulair, Zyrtec and 2 Inhalers.  States she was glad to get them as two of her children Malayah and Doree Fudge had both been having a really hard time with their allergies and asthma.  She gave them both nebulizer treatments and they are both now much better and have restarted the other medications.   Client states that she herself " feels like her blood pressure may be up due to stress".  Denies being on any medication but has a history of "Heart Catherization where they cleaned out her arteries and states that she has a heart valve that's not operating as it should"  Client also reports h/o CVA, but no disabling effects from it. Nurse asked if she was having any headache, dizziness or blurred vision and client reports "no".  Nurse advised client to get blood pressure as soon as possible and client agreed she would [facility] a [facility] to see if they have a monitor where she can check it, and go to Urgent Care/ER if elevated. Plan: as above.  Nurse will check B/P next Tuesday at shelter.  Nathalia Wismer D. [facility] Congregational Nurse [place] [phone]

## 2021-04-19 NOTE — ED Provider Notes (Signed)
[facility] CARE CENTER   [id number] 04/18/21 Arrival Time: 1628  ASSESSMENT & PLAN:  1. Elevated blood pressure reading in office with diagnosis of hypertension    No s/s of hypertensive urgency. Desires to begin tx again. Start: Meds ordered this encounter  Medications  . amLODipine (NORVASC) 5 MG tablet    Sig: Take 1 tablet (5 mg total) by mouth daily.    Dispense:  30 tablet    Refill:  2     Follow-up Information    Schedule an appointment as soon as possible for a visit  with Pricilla Riffle, MD.   Specialty: Cardiology Contact information: [address] [place] [address] [phone]               Reviewed expectations re: course of current medical issues. Questions answered. Outlined signs and symptoms indicating need for more acute intervention. Patient verbalized understanding. After Visit Summary given.   SUBJECTIVE:  Melissa [name] is a 35 y.o. female who presents with concerns regarding increased blood pressures. Reports that she has been treated for hypertension in the past but off medication for quite awhile.  She reports no chest pain on exertion, no dyspnea on exertion, no swelling of ankles, no orthostatic dizziness or lightheadedness and no orthopnea or paroxysmal nocturnal dyspnea.  Social History   Tobacco Use  Smoking Status Never Smoker  Smokeless Tobacco Never Used      OBJECTIVE:  Vitals:   04/18/21 1748  BP: (!) 157/96  Pulse: 95  Resp: 16  Temp: 98.8 F (37.1 C)  TempSrc: Oral  SpO2: 100%    General appearance: alert; no distress Eyes: PERRLA; EOMI HENT: normocephalic; atraumatic Neck: supple Lungs: unlabored Abdomen: soft, non-tender; bowel sounds normal Extremities: no edema; symmetrical with no gross deformities Skin: warm and dry Psychological: alert and cooperative; normal mood and affect  ECG: Orders placed or performed in visit on 04/25/20  . EKG 12-Lead    Labs Reviewedc: Results  for orders placed or performed during the hospital encounter of 10/31/20  CBC  Result Value Ref Range   WBC 5.8 4.0 - 10.5 K/uL   RBC 4.14 3.87 - 5.11 MIL/uL   Hemoglobin 10.1 (L) 12.0 - 15.0 g/dL   HCT 19.6 (L) 22.2 - 97.9 %   MCV 80.7 80.0 - 100.0 fL   MCH 24.4 (L) 26.0 - 34.0 pg   MCHC 30.2 30.0 - 36.0 g/dL   RDW 89.2 (H) 11.9 - 41.7 %   Platelets 180 150 - 400 K/uL   nRBC 0.0 0.0 - 0.2 %  Comprehensive metabolic panel  Result Value Ref Range   Sodium 137 135 - 145 mmol/L   Potassium 3.4 (L) 3.5 - 5.1 mmol/L   Chloride 108 98 - 111 mmol/L   CO2 19 (L) 22 - 32 mmol/L   Glucose, Bld 87 70 - 99 mg/dL   BUN <5 (L) 6 - 20 mg/dL   Creatinine, Ser 4.08 0.44 - 1.00 mg/dL   Calcium 8.9 8.9 - 14.4 mg/dL   Total Protein 6.6 6.5 - 8.1 g/dL   Albumin 2.8 (L) 3.5 - 5.0 g/dL   AST 15 15 - 41 U/L   ALT 8 0 - 44 U/L   Alkaline Phosphatase 126 38 - 126 U/L   Total Bilirubin 1.0 0.3 - 1.2 mg/dL   GFR, Estimated >81 >85 mL/min   Anion gap 10 5 - 15  RPR  Result Value Ref Range   RPR Ser  Ql NON REACTIVE NON REACTIVE  CBC  Result Value Ref Range   WBC 9.0 4.0 - 10.5 K/uL   RBC 3.95 3.87 - 5.11 MIL/uL   Hemoglobin 9.8 (L) 12.0 - 15.0 g/dL   HCT 25.0 (L) 53.9 - 76.7 %   MCV 81.3 80.0 - 100.0 fL   MCH 24.8 (L) 26.0 - 34.0 pg   MCHC 30.5 30.0 - 36.0 g/dL   RDW 34.1 (H) 93.7 - 90.2 %   Platelets 182 150 - 400 K/uL   nRBC 0.0 0.0 - 0.2 %  Type and screen  Result Value Ref Range   ABO/RH(D) O POS    Antibody Screen NEG    Sample Expiration      11/03/2020,2359 Performed at [facility], [address], [place], [place] [address]    Labs Reviewed - No data to display  Imaging: No results found.  Allergies  Allergen Reactions  . Ginger Anaphylaxis    Past Medical History:  Diagnosis Date  . Abnormal Pap smear 2010   Repeat pap done;Hysteroscopy;Last pap 2012;was normal  . Anemia 2010   Iron supplements PP  . Asthma    Has a nebulizer;triggered by stress  . Chest  pain   . Colon polyps   . Headache(784.0)   . Heart attack [facility])    MD thinks may have had a heart attack;Cardiologist @ [facility]  . History of CVA (cerebrovascular accident) 04/09/2013   Pt reports presumptive "stroke" when she was 35y.o.; followed by [facility] neurology for "chronic nerve pain" and has been Rx'd Hydrocodone and Mobic  . Hypertension   . Infection    BV;may get frequently, UTI  . Multiple food allergies   . Nerve pain    Chronic;d/t possible stroke and MI  . Placenta previa antepartum 2010   Placenta moved 3 days before baby was born  . Pre-diabetes   . Shortness of breath   . Sickle cell trait (HCC)   . Stroke [facility]) 2013   MD thought may have had a stroke and heart attack;has neurologist @ [facility]  . Vitamin D deficiency    Social History   Socioeconomic History  . Marital status: Married    Spouse name: Apolinar Junes  . Number of children: 3  . Years of education: 50  . Highest education level: Not on file  Occupational History  . Not on file  Tobacco Use  . Smoking status: Never Smoker  . Smokeless tobacco: Never Used  Vaping Use  . Vaping Use: Never used  Substance and Sexual Activity  . Alcohol use: Not Currently    Alcohol/week: 0.0 standard drinks    Comment: last use 03/16/2020  . Drug use: No  . Sexual activity: Yes    Birth control/protection: None  Other Topics Concern  . Not on file  Social History Narrative   Lives at home with her three children.   Right-handed.   1 cup caffeine daily.   Social Determinants of Health   Financial Resource Strain: Not on file  Food Insecurity: Not on file  Transportation Needs: Not on file  Physical Activity: Not on file  Stress: Not on file  Social Connections: Not on file  Intimate Partner Violence: Not on file   Family History  Problem Relation Age of Onset  . Sickle cell trait Father   . Hypertension Mother   . Anemia Mother   . Asthma Mother   . Diabetes Mother   . Cervical  cancer  Paternal Aunt   . Cervical cancer Paternal Grandmother   . Diabetes Paternal Grandmother   . Diabetes Paternal Grandfather   . Diabetes Paternal Aunt   . Asthma Brother   . Asthma Daughter   . Asthma Son    Past Surgical History:  Procedure Laterality Date  . HYSTEROSCOPY  2010  . LEFT HEART CATH AND CORONARY ANGIOGRAPHY N/A 02/18/2019   Procedure: LEFT HEART CATH AND CORONARY ANGIOGRAPHY;  Surgeon: [name] Lex, MD;  Location: [facility] INVASIVE CV LAB;  Service: Cardiovascular;  Laterality: N/A;      Mardella Layman, MD 04/19/21 [phone]

## 2021-04-27 ENCOUNTER — Telehealth: Payer: Self-pay

## 2021-04-27 NOTE — Telephone Encounter (Signed)
Nurse sent  Text message to client to inquire if she started her Norvasc 5 mg medication as ordered.  Client responded back that she had.  Nurse then responded for her to continue med as ordered daily and to have it rechecked to ensure that it was coming down. Nurse will follow up next week at the [facility] shelter where client is staying.

## 2021-04-27 NOTE — Congregational Nurse Program (Signed)
Dept: [phone]   Congregational Nurse Program Note  Date of Encounter: 04/25/2021   Client arrived from work to the [facility] shelter at 1615.  Appears to be in good spirits.  Has two of her children, other two on their way from after school care. Nurse following up with client after ED visit last Tuesday.  Client reports that her BP was elevated at ED and she was ordered Norvasc 5mg . Verified in Epic review of visit by nurse.  Client reports that she just picked up medication on yesterday and plans to start taking it. Nurse educated client on importance of taking medication as ordered as well as complications of elevated BP which client states she understands. Client reports she still has headaches, no dizziness or blurred vision, just the headaches with her allergies.  Nurse checked BP LA 144/96; RA 126/98 waited 20 minutes while client rested in chair and rechecked LA 144/91 RA 124/86. Instructed client to take medication asap.  Client states she will.  Also told to get it rechecked either at Urgent Care or Drug store that may have a device to check it.  Client states she will. Nurse will follow up on next week when returning to shelter.  Krystopher Kuenzel D. [facility] Congregational Nurse [place] [phone]   Past Medical History: Past Medical History:  Diagnosis Date  . Abnormal Pap smear 2010   Repeat pap done;Hysteroscopy;Last pap 2012;was normal  . Anemia 2010   Iron supplements PP  . Asthma    Has a nebulizer;triggered by stress  . Chest pain   . Colon polyps   . Headache(784.0)   . Heart attack [facility])    MD thinks may have had a heart attack;Cardiologist @ [facility]  . History of CVA (cerebrovascular accident) 04/09/2013   Pt reports presumptive "stroke" when she was 35y.o.; followed by [facility] neurology for "chronic nerve pain" and has been Rx'd Hydrocodone and Mobic  . Hypertension   . Infection    BV;may get frequently, UTI  . Multiple food allergies   . Nerve pain     Chronic;d/t possible stroke and MI  . Placenta previa antepartum 2010   Placenta moved 3 days before baby was born  . Pre-diabetes   . Shortness of breath   . Sickle cell trait (HCC)   . Stroke [facility]) 2013   MD thought may have had a stroke and heart attack;has neurologist @ [facility]  . Vitamin D deficiency     Encounter Details:  CNP Questionnaire - 04/25/21 1600      Questionnaire   Do you give verbal consent to treat you today? Yes    Visit Setting Other    Location Patient Served At [facility]    Patient Status Homeless    Medical Provider Yes    Insurance Medicaid    Intervention Assess (including screenings);Counsel;Educate    Housing/Utilities No permanent housing    Transportation --   Client has transportation   [facility] Do not feel physically and emotionally safe where you currently live    Food --   No food insecurities   Medication Have medication insecurities   Client just obtained her prescription for Norvasc 5mg  that was ordered on April 19 by ER doctor. Today will be her first day taking it.   Referrals Urgent Care    Screening Referrals --   No annual due.   ED Visit Averted Yes    Life-Saving Intervention Made Yes

## 2021-05-10 NOTE — Congregational Nurse Program (Signed)
  Dept: [place] Nurse Program Note  Date of Encounter: 05/02/2021  Met with client at shelter who's here with her 4 children.  Client with history of hypertension.  Started taking Norvasc $RemoveBefore'5mg'uHflrlESroibI$   Regularly on last week.  Continues to have headache for which she takes Tylenol extra strength 2 tabs and gets relief.  B/P today is 132/80 (LA) 122/84 (RA).  Reinforced need to continue medication daily as ordered.  No other complaints today. Nurse will follow up next week when return to shelter.   Makaela Cando D. [place] Nurse [place] [phone]  Past Medical History: Past Medical History:  Diagnosis Date  . Abnormal Pap smear 2010   Repeat pap done;Hysteroscopy;Last pap 2012;was normal  . Anemia 2010   Iron supplements PP  . Asthma    Has a nebulizer;triggered by stress  . Chest pain   . Colon polyps   . Headache(784.0)   . Heart attack [facility])    MD thinks may have had a heart attack;Cardiologist @ [facility]  . History of CVA (cerebrovascular accident) 04/09/2013   Pt reports presumptive "stroke" when she was 35y.o.; followed by [facility] neurology for "chronic nerve pain" and has been Rx'd Hydrocodone and Mobic  . Hypertension   . Infection    BV;may get frequently, UTI  . Multiple food allergies   . Nerve pain    Chronic;d/t possible stroke and MI  . Placenta previa antepartum 2010   Placenta moved 3 days before baby was born  . Pre-diabetes   . Shortness of breath   . Sickle cell trait ([place])   . Stroke [facility]) 2013   MD thought may have had a stroke and heart attack;has neurologist @ [facility]  . Vitamin D deficiency     Encounter Details:  CNP Questionnaire - 05/02/21 1635      Questionnaire   Do you give verbal consent to treat you today? Yes    Visit Setting Other    Location Patient Served At [facility]    Patient Status Homeless    Medical Provider Yes    Insurance Medicaid    Intervention Assess (including  screenings);Counsel;Educate    Housing/Utilities No permanent housing    Transportation --   Client has transportation   Chiropractor Do not feel physically and emotionally safe where you currently live    Food --   No food insecurities   Medication Have medication insecurities   Client just obtained her prescription for Norvasc $RemoveBef'5mg'zOTLnCoasZ$  that was ordered on April 19 by ER doctor. Today will be her first day taking it.   Referrals Urgent Care    Screening Referrals --   No annual due.   ED Visit Averted Yes    Life-Saving Intervention Made Yes

## 2021-05-16 NOTE — Congregational Nurse Program (Signed)
Dept: [phone]   Congregational Nurse Program Note  Date of Encounter: 05/16/2021   Client came into the sunroom to meet with nurse.  Missed her on lats week as client returned late to shelter.  Client reports that she's still working full time, looking for housing and also trying to find a new therapist for her 2 younger children [place] and [place].   States she continues to be stressed but feels she is handling it as best she can. Infant daughter had her first visitation with the father this past week and mom doesn't feel that it went well.  She plans to inform her attorney.  Alan Mulder continues to have difficulty at school and client has to go to the school and sit with him so that he behaves.  Alan Mulder is currently only receiving Occupational Therapy which mom feels that it helps a lot. He does remain incontinent of urine.  The only medication that he is on is melatonin to help with his sleep which is why she really wants to find him another therapist.  She doesn't feel that the school system is doing all they can to assist her. Her daughter Kathryne Hitch is receiving OT as well and it's helping however she too is incontinent of urine. Mom is not sure why has been told several things.  Mom is having to buy diapers and pull ups for 3 of her children as well as pay application fees being charged as she looks for permanent housing and tries to save money for deposits and first months rent. Headaches are less frequent.  B/P today 136/88 (LA) 124?84 (RA) Nurse instructed client in deep breathing exercises, prayer and meditating when she can get some quiet moments. She continues to take her Norvasc 5mg  daily as ordered. Nurse is researching behavioral health services available in [facility] and will follow-up with client on next week or sooner when nurse hears back .  Angelo Caroll D. [facility] MSN, RN Congregational Nurse [place] [phone]  Past Medical History: Past Medical History:  Diagnosis Date  . Abnormal Pap smear  2010   Repeat pap done;Hysteroscopy;Last pap 2012;was normal  . Anemia 2010   Iron supplements PP  . Asthma    Has a nebulizer;triggered by stress  . Chest pain   . Colon polyps   . Headache(784.0)   . Heart attack [facility])    MD thinks may have had a heart attack;Cardiologist @ [facility]  . History of CVA (cerebrovascular accident) 04/09/2013   Pt reports presumptive "stroke" when she was 35y.o.; followed by [facility] neurology for "chronic nerve pain" and has been Rx'd Hydrocodone and Mobic  . Hypertension   . Infection    BV;may get frequently, UTI  . Multiple food allergies   . Nerve pain    Chronic;d/t possible stroke and MI  . Placenta previa antepartum 2010   Placenta moved 3 days before baby was born  . Pre-diabetes   . Shortness of breath   . Sickle cell trait (HCC)   . Stroke [facility]) 2013   MD thought may have had a stroke and heart attack;has neurologist @ [facility]  . Vitamin D deficiency     Encounter Details:  CNP Questionnaire - 05/16/21 1619      Questionnaire   Do you give verbal consent to treat you today? Yes    Visit Setting Other    Location Patient Served At [facility]    Patient Status Homeless    Medical Provider Yes    Insurance Medicaid  Intervention Assess (including screenings);Counsel;Educate    Housing/Utilities No permanent housing    Transportation --   Client has transportation   [employer] Do not feel physically and emotionally safe where you currently live    Food --   No food insecurities   Medication --   Client has meds and taking as ordered for B/P   Referrals Behavioral/Mental Health Provider    Screening Referrals --   No annual due.   ED Visit Averted Yes    Life-Saving Intervention Made Yes

## 2021-07-11 NOTE — Progress Notes (Signed)
Cardiology Office Note:    Date:  07/13/2021   ID:  Melissa [name], DOB 14-Dec-1986, MRN [id number]  PCP:  Pcp, No  [facility] Cardiologist:  Dietrich Pates, MD  [facility] Electrophysiologist:  None   Chief Complaint: Palpitations  History of Present Illness:    Melissa [name] is a 35 y.o. female with a hx of Inferior STEMI with normal coronaries presents for follow up.  Admitted February 2020 with active chest pain and EKG showing inferior lateral ST elevation. No family history of CAD. She was taken to Cath Lab and initially an attempt to radial access was unsuccessful because of spasm. Cath via groin access showed no evidence of coronary artery disease. Pain relieved with morphine. Echocardiogram with preserved LV function. Dr. Tenny Craw did not fall symptoms related to pericarditis but could be costochondritis  Seen by me 05/29/20 for axillary intermittent pain and questionable murmur while 3 month pregnant. This is her fourth pregnancy. No prior pregnancy complications. Poor PO intake in setting of UTI. Advised hydration and PRN follow up.   Patient is here for palpitation.  Patient had intermittent blood pressure issue including her last pregnancy.  After delivery, her blood pressure was elevated and placed on amlodipine 5 mg daily.  She had a similar issue after her second pregnancy and required amlodipine for 1 year.  For past 2 months, patient has intermittent palpitation.  Each episode lasting for about 1 minutes occurring every few days.  Occasional shortness of breath with palpitation.  No associated chest pain, dizziness or syncope.  Denies orthopnea, PND, lower extremity edema or melena.  Past Medical History:  Diagnosis Date   Abnormal Pap smear 2010   Repeat pap done;Hysteroscopy;Last pap 2012;was normal   Anemia 2010   Iron supplements PP   Asthma    Has a nebulizer;triggered by stress   Chest pain    Colon polyps    Headache(784.0)    Heart attack [facility])    MD thinks may  have had a heart attack;Cardiologist @ [facility]   History of CVA (cerebrovascular accident) 04/09/2013   Pt reports presumptive "stroke" when she was 35y.o.; followed by [facility] neurology for "chronic nerve pain" and has been Rx'd Hydrocodone and Mobic   Hypertension    Infection    BV;may get frequently, UTI   Multiple food allergies    Nerve pain    Chronic;d/t possible stroke and MI   Placenta previa antepartum 2010   Placenta moved 3 days before baby was born   Pre-diabetes    Shortness of breath    Sickle cell trait (HCC)    Stroke [facility]) 2013   MD thought may have had a stroke and heart attack;has neurologist @ [facility]   Vitamin D deficiency     Past Surgical History:  Procedure Laterality Date   HYSTEROSCOPY  2010   LEFT HEART CATH AND CORONARY ANGIOGRAPHY N/A 02/18/2019   Procedure: LEFT HEART CATH AND CORONARY ANGIOGRAPHY;  Surgeon: [name] Lex, MD;  Location: [facility] INVASIVE CV LAB;  Service: Cardiovascular;  Laterality: N/A;    Current Medications: Current Meds  Medication Sig   amLODipine (NORVASC) 5 MG tablet Take 1 tablet (5 mg total) by mouth daily.   fluticasone (FLONASE) 50 MCG/ACT nasal spray Place 2 sprays into both nostrils as needed.     Allergies:   Ginger   Social History   Socioeconomic History   Marital status: Married    Spouse name: Apolinar Junes   Number of  children: 3   Years of education: 15   Highest education level: Not on file  Occupational History   Not on file  Tobacco Use   Smoking status: Never   Smokeless tobacco: Never  Vaping Use   Vaping Use: Never used  Substance and Sexual Activity   Alcohol use: Not Currently    Alcohol/week: 0.0 standard drinks    Comment: last use 03/16/2020   Drug use: No   Sexual activity: Yes    Birth control/protection: None  Other Topics Concern   Not on file  Social History Narrative   Lives at home with her three children.   Right-handed.   1 cup caffeine daily.   Social  Determinants of Health   Financial Resource Strain: Not on file  Food Insecurity: Not on file  Transportation Needs: Not on file  Physical Activity: Not on file  Stress: Not on file  Social Connections: Not on file     Family History: The patient's family history includes Anemia in her mother; Asthma in her brother, daughter, mother, and son; Cervical cancer in her paternal aunt and paternal grandmother; Diabetes in her mother, paternal aunt, paternal grandfather, and paternal grandmother; Hypertension in her mother; Sickle cell trait in her father.    ROS:   Please see the history of present illness.    All other systems reviewed and are negative.   EKGs/Labs/Other Studies Reviewed:    The following studies were reviewed today:  LEFT HEART CATH AND CORONARY ANGIOGRAPHY 02/18/19  Conclusion   LV end diastolic pressure is normal. The left ventricular systolic function is normal. There is no aortic valve stenosis. The left ventricular ejection fraction is 55-65% by visual estimate.   SUMMARY Angiographically normal coronary arteries Normal LVEF and EDP.  No regional wall motion normality on LV gram.   RECOMMENDATIONS Admit to telemetry with presumed diagnosis of pericarditis. Check 2D echocardiogram and inflammatory markers. Start colchicine and ibuprofen. Monitor closely for exacerbation of respiratory issues.   Bryan Lemma, MD  _____________   Echocardiogram 02/19/19 IMPRESSIONS  1. The left ventricle has normal systolic function with an ejection fraction of 60-65%. The cavity size was normal. Left ventricular diastolic parameters were normal No evidence of left ventricular regional wall motion abnormalities.  2. The right ventricle has normal systolic function. The cavity was normal. There is no increase in right ventricular wall thickness.  3. The mitral valve is normal in structure.  4. The tricuspid valve is normal in structure.  5. The aortic valve is normal in  structure.  6. The pulmonic valve was normal in structure.  7. No evidence of left ventricular regional wall motion abnormalities.   FINDINGS  Left Ventricle: The left ventricle has normal systolic function, with an ejection fraction of 60-65%. The cavity size was normal. There is no increase in left ventricular wall thickness. Left ventricular diastolic parameters were normal No evidence of left ventricular regional wall motion abnormalities.. Right Ventricle: The right ventricle has normal systolic function. The cavity was normal. There is no increase in right ventricular wall thickness. Left Atrium: left atrial size was normal in size Right Atrium: right atrial size was normal in size. Interatrial Septum: No atrial level shunt detected by color flow Doppler. Pericardium: There is no evidence of pericardial effusion. Mitral Valve: The mitral valve is normal in structure. Mitral valve regurgitation is trivial by color flow Doppler. Tricuspid Valve: The tricuspid valve is normal in structure. Tricuspid valve regurgitation was not visualized by color  flow Doppler. Aortic Valve: The aortic valve is normal in structure. Aortic valve regurgitation was not visualized by color flow Doppler. Pulmonic Valve: The pulmonic valve was normal in structure. Pulmonic valve regurgitation is not visualized by color flow Doppler. Venous: The inferior vena cava is normal in size with greater than 50% respiratory variability.   EKG:  EKG is ordered today.  The ekg ordered today demonstrates sinus rhythm at rate of 91 bpm  Recent Labs: 10/31/2020: ALT 8; BUN <5; Creatinine, Ser 0.54; Potassium 3.4; Sodium 137 11/01/2020: Hemoglobin 9.8; Platelets 182  Recent Lipid Panel    Component Value Date/Time   CHOL 131 04/25/2020 1254   TRIG 78 04/25/2020 1254   HDL 52 04/25/2020 1254   CHOLHDL 2.4 02/24/2020 0947   CHOLHDL 2.4 02/18/2019 1548   VLDL 7 02/18/2019 1548   LDLCALC 64 04/25/2020 1254     Physical  Exam:    VS:  BP 122/76   Pulse 91   Ht 5\' 5"  (1.651 m)   Wt 198 lb (89.8 kg)   BMI 32.95 kg/m     Wt Readings from Last 3 Encounters:  07/13/21 198 lb (89.8 kg)  10/31/20 194 lb 8 oz (88.2 kg)  10/19/20 197 lb 8 oz (89.6 kg)     GEN: Well nourished, well developed in no acute distress HEENT: Normal NECK: No JVD; No carotid bruits LYMPHATICS: No lymphadenopathy CARDIAC: RRR, no murmurs, rubs, gallops RESPIRATORY:  Clear to auscultation without rales, wheezing or rhonchi  ABDOMEN: Soft, non-tender, non-distended MUSCULOSKELETAL:  No edema; No deformity  SKIN: Warm and dry NEUROLOGIC:  Alert and oriented x 3 PSYCHIATRIC:  Normal affect   ASSESSMENT AND PLAN:    Palpitations Associated with shortness of breath.  Denies caffeine intake.  Not associated with menstrual cycle or food.  Suspect SVT.  Encourage adequate hydration.  Get monitor and echocardiogram. Check TSH.  Rule out postpartum cardiomyopathy.  She does not have heart failure symptoms.  2.  Postpartum hypertension -Patient had postpartum hypertension after second pregnancy requiring amlodipine for 1 year. -She just delivered her fourth baby.  Intermittent blood pressure issue during fourth (last) pregnancy but did not require medication.  Postpartum she was placed on amlodipine.  Nurse noted blood pressure difference between left and right arm at home.  Blood pressure stable on both upper extremity during office visit. -Continue amlodipine 5 mg daily.  Medication Adjustments/Labs and Tests Ordered: Current medicines are reviewed at length with the patient today.  Concerns regarding medicines are outlined above.  Orders Placed This Encounter  Procedures   TSH   LONG TERM MONITOR (3-14 DAYS)   EKG 12-Lead   ECHOCARDIOGRAM COMPLETE    No orders of the defined types were placed in this encounter.   Patient Instructions  Medication Instructions:  Your physician recommends that you continue on your current  medications as directed. Please refer to the Current Medication list given to you today.  *If you need a refill on your cardiac medications before your next appointment, please call your pharmacy*   Lab Work: TODAY:  TSH  If you have labs (blood work) drawn today and your tests are completely normal, you will receive your results only by: MyChart Message (if you have MyChart) OR A paper copy in the mail If you have any lab test that is abnormal or we need to change your treatment, we will call you to review the results.   Testing/Procedures: Your physician has requested that you have an echocardiogram.  Echocardiography is a painless test that uses sound waves to create images of your heart. It provides your doctor with information about the size and shape of your heart and how well your heart's chambers and valves are working. This procedure takes approximately one hour. There are no restrictions for this procedure.   ZIO XT- Long Term Monitor Instructions  Your physician has requested you wear a ZIO patch monitor for 14 days.  This is a single patch monitor. [employer] supplies one patch monitor per enrollment. Additional stickers are not available. Please do not apply patch if you will be having a Nuclear Stress Test,  Echocardiogram, Cardiac CT, MRI, or Chest Xray during the period you would be wearing the  monitor. The patch cannot be worn during these tests. You cannot remove and re-apply the  ZIO XT patch monitor.  Your ZIO patch monitor will be mailed 3 day USPS to your address on file. It may take 3-5 days  to receive your monitor after you have been enrolled.  Once you have received your monitor, please review the enclosed instructions. Your monitor  has already been registered assigning a specific monitor serial # to you.  Billing and Patient Assistance Program Information  We have supplied [employer] with any of your insurance information on file for billing purposes. [employer]  offers a sliding scale Patient Assistance Program for patients that do not have  insurance, or whose insurance does not completely cover the cost of the ZIO monitor.  You must apply for the Patient Assistance Program to qualify for this discounted rate.  To apply, please call [employer] at [phone], select option 4, select option 2, ask to apply for  Patient Assistance Program. Meredeth Ide will ask your household income, and how many people  are in your household. They will quote your out-of-pocket cost based on that information.  [employer] will also be able to set up a 50-month, interest-free payment plan if needed.  Applying the monitor   Shave hair from upper left chest.  Hold abrader disc by orange tab. Rub abrader in 40 strokes over the upper left chest as  indicated in your monitor instructions.  Clean area with 4 enclosed alcohol pads. Let dry.  Apply patch as indicated in monitor instructions. Patch will be placed under collarbone on left  side of chest with arrow pointing upward.  Rub patch adhesive wings for 2 minutes. Remove white label marked "1". Remove the white  label marked "2". Rub patch adhesive wings for 2 additional minutes.  While looking in a mirror, press and release button in center of patch. A small green light will  flash 3-4 times. This will be your only indicator that the monitor has been turned on.  Do not shower for the first 24 hours. You may shower after the first 24 hours.  Press the button if you feel a symptom. You will hear a small click. Record Date, Time and  Symptom in the Patient Logbook.  When you are ready to remove the patch, follow instructions on the last 2 pages of Patient  Logbook. Stick patch monitor onto the last page of Patient Logbook.  Place Patient Logbook in the blue and white box. Use locking tab on box and tape box closed  securely. The blue and white box has prepaid postage on it. Please place it in the mailbox as  soon as possible. Your  physician should have your test results approximately 7 days after the  monitor has been mailed back  to [employer].  Call [facility] Customer Care at [phone] if you have questions regarding  your ZIO XT patch monitor. Call them immediately if you see an orange light blinking on your  monitor.  If your monitor falls off in less than 4 days, contact our Monitor department at [phone].  If your monitor becomes loose or falls off after 4 days call [employer] at [phone] for  suggestions on securing your monitor    Follow-Up: At [facility], you and your health needs are our priority.  As part of our continuing mission to provide you with exceptional heart care, we have created designated Provider Care Teams.  These Care Teams include your primary Cardiologist (physician) and Advanced Practice Providers (APPs -  Physician Assistants and Nurse Practitioners) who all work together to provide you with the care you need, when you need it.  We recommend signing up for the patient portal called "MyChart".  Sign up information is provided on this After Visit Summary.  MyChart is used to connect with patients for Virtual Visits (Telemedicine).  Patients are able to view lab/test results, encounter notes, upcoming appointments, etc.  Non-urgent messages can be sent to your provider as well.   To learn more about what you can do with MyChart, go to [email or web address].    Your next appointment:   2 month(s)    09/13/2021 ARRIVE AT 11:00   The format for your next appointment:   In Person  Provider:   You may see Dietrich Pates, MD or one of the following Advanced Practice Providers on your designated Care Team:   Tereso Newcomer, PA-C Chelsea Aus, PA-C   Other Instructions    Signed, Manson Passey, [place]  07/13/2021 8:40 AM    [facility]

## 2021-07-13 ENCOUNTER — Other Ambulatory Visit: Payer: Self-pay

## 2021-07-13 ENCOUNTER — Ambulatory Visit (INDEPENDENT_AMBULATORY_CARE_PROVIDER_SITE_OTHER): Payer: Medicaid Other

## 2021-07-13 ENCOUNTER — Ambulatory Visit: Payer: Medicaid Other | Admitting: Physician Assistant

## 2021-07-13 ENCOUNTER — Ambulatory Visit: Payer: Medicaid Other

## 2021-07-13 ENCOUNTER — Other Ambulatory Visit: Payer: Self-pay | Admitting: *Deleted

## 2021-07-13 VITALS — BP 122/76 | HR 91 | Ht 65.0 in | Wt 198.0 lb

## 2021-07-13 DIAGNOSIS — R002 Palpitations: Secondary | ICD-10-CM

## 2021-07-13 DIAGNOSIS — I10 Essential (primary) hypertension: Secondary | ICD-10-CM

## 2021-07-13 DIAGNOSIS — R079 Chest pain, unspecified: Secondary | ICD-10-CM

## 2021-07-13 LAB — TSH: TSH: 0.644 u[IU]/mL (ref 0.450–4.500)

## 2021-07-13 NOTE — Progress Notes (Unsigned)
14 day ZIO XT applied in office from office inventory.

## 2021-07-13 NOTE — Patient Instructions (Addendum)
Medication Instructions:  Your physician recommends that you continue on your current medications as directed. Please refer to the Current Medication list given to you today.  *If you need a refill on your cardiac medications before your next appointment, please call your pharmacy*   Lab Work: TODAY:  TSH  If you have labs (blood work) drawn today and your tests are completely normal, you will receive your results only by: MyChart Message (if you have MyChart) OR A paper copy in the mail If you have any lab test that is abnormal or we need to change your treatment, we will call you to review the results.   Testing/Procedures: Your physician has requested that you have an echocardiogram. Echocardiography is a painless test that uses sound waves to create images of your heart. It provides your doctor with information about the size and shape of your heart and how well your heart's chambers and valves are working. This procedure takes approximately one hour. There are no restrictions for this procedure.   ZIO XT- Long Term Monitor Instructions  Your physician has requested you wear a ZIO patch monitor for 14 days.  This is a single patch monitor. [employer] supplies one patch monitor per enrollment. Additional stickers are not available. Please do not apply patch if you will be having a Nuclear Stress Test,  Echocardiogram, Cardiac CT, MRI, or Chest Xray during the period you would be wearing the  monitor. The patch cannot be worn during these tests. You cannot remove and re-apply the  ZIO XT patch monitor.  Your ZIO patch monitor will be mailed 3 day USPS to your address on file. It may take 3-5 days  to receive your monitor after you have been enrolled.  Once you have received your monitor, please review the enclosed instructions. Your monitor  has already been registered assigning a specific monitor serial # to you.  Billing and Patient Assistance Program Information  We have supplied  [employer] with any of your insurance information on file for billing purposes. [employer] offers a sliding scale Patient Assistance Program for patients that do not have  insurance, or whose insurance does not completely cover the cost of the ZIO monitor.  You must apply for the Patient Assistance Program to qualify for this discounted rate.  To apply, please call [employer] at [phone], select option 4, select option 2, ask to apply for  Patient Assistance Program. Meredeth Ide will ask your household income, and how many people  are in your household. They will quote your out-of-pocket cost based on that information.  [employer] will also be able to set up a 11-month, interest-free payment plan if needed.  Applying the monitor   Shave hair from upper left chest.  Hold abrader disc by orange tab. Rub abrader in 40 strokes over the upper left chest as  indicated in your monitor instructions.  Clean area with 4 enclosed alcohol pads. Let dry.  Apply patch as indicated in monitor instructions. Patch will be placed under collarbone on left  side of chest with arrow pointing upward.  Rub patch adhesive wings for 2 minutes. Remove white label marked "1". Remove the white  label marked "2". Rub patch adhesive wings for 2 additional minutes.  While looking in a mirror, press and release button in center of patch. A small green light will  flash 3-4 times. This will be your only indicator that the monitor has been turned on.  Do not shower for the first 24 hours. You may  shower after the first 24 hours.  Press the button if you feel a symptom. You will hear a small click. Record Date, Time and  Symptom in the Patient Logbook.  When you are ready to remove the patch, follow instructions on the last 2 pages of Patient  Logbook. Stick patch monitor onto the last page of Patient Logbook.  Place Patient Logbook in the blue and white box. Use locking tab on box and tape box closed  securely. The blue and white box  has prepaid postage on it. Please place it in the mailbox as  soon as possible. Your physician should have your test results approximately 7 days after the  monitor has been mailed back to [facility].  Call [facility] Customer Care at [phone] if you have questions regarding  your ZIO XT patch monitor. Call them immediately if you see an orange light blinking on your  monitor.  If your monitor falls off in less than 4 days, contact our Monitor department at [phone].  If your monitor becomes loose or falls off after 4 days call [employer] at [phone] for  suggestions on securing your monitor    Follow-Up: At [facility], you and your health needs are our priority.  As part of our continuing mission to provide you with exceptional heart care, we have created designated Provider Care Teams.  These Care Teams include your primary Cardiologist (physician) and Advanced Practice Providers (APPs -  Physician Assistants and Nurse Practitioners) who all work together to provide you with the care you need, when you need it.  We recommend signing up for the patient portal called "MyChart".  Sign up information is provided on this After Visit Summary.  MyChart is used to connect with patients for Virtual Visits (Telemedicine).  Patients are able to view lab/test results, encounter notes, upcoming appointments, etc.  Non-urgent messages can be sent to your provider as well.   To learn more about what you can do with MyChart, go to [email or web address].    Your next appointment:   2 month(s)    09/13/2021 ARRIVE AT 11:00   The format for your next appointment:   In Person  Provider:   You may see Dietrich Pates, MD or one of the following Advanced Practice Providers on your designated Care Team:   Tereso Newcomer, PA-C Chelsea Aus, [place]   Other Instructions

## 2021-07-17 ENCOUNTER — Telehealth: Payer: Self-pay | Admitting: Internal Medicine

## 2021-07-17 NOTE — Telephone Encounter (Signed)
Spoke with pt and told her to call the monitor company to make sure everything is working properly.  Pt appreciative for call.

## 2021-07-17 NOTE — Telephone Encounter (Signed)
Pt states that her heart monitor is blinking red, please advise.

## 2021-07-27 ENCOUNTER — Other Ambulatory Visit: Payer: Self-pay

## 2021-07-27 ENCOUNTER — Ambulatory Visit (HOSPITAL_COMMUNITY): Payer: Medicaid Other | Attending: Cardiology

## 2021-07-27 DIAGNOSIS — R002 Palpitations: Secondary | ICD-10-CM | POA: Diagnosis present

## 2021-07-27 LAB — ECHOCARDIOGRAM COMPLETE
Area-P 1/2: 4.96 cm2
S' Lateral: 2.8 cm

## 2021-07-27 MED ORDER — PERFLUTREN LIPID MICROSPHERE
1.0000 mL | INTRAVENOUS | Status: AC | PRN
  Administered 2021-07-27: 1 mL via INTRAVENOUS

## 2021-09-13 ENCOUNTER — Ambulatory Visit: Payer: Medicaid Other | Admitting: Physician Assistant

## 2021-09-13 ENCOUNTER — Encounter: Payer: Self-pay | Admitting: Physician Assistant

## 2021-09-13 ENCOUNTER — Other Ambulatory Visit: Payer: Self-pay

## 2021-09-13 VITALS — BP 126/60 | HR 82 | Ht 65.0 in | Wt 199.4 lb

## 2021-09-13 DIAGNOSIS — I10 Essential (primary) hypertension: Secondary | ICD-10-CM

## 2021-09-13 DIAGNOSIS — R002 Palpitations: Secondary | ICD-10-CM | POA: Diagnosis not present

## 2021-09-13 NOTE — Patient Instructions (Addendum)
Medication Instructions:  Your physician recommends that you continue on your current medications as directed. Please refer to the Current Medication list given to you today.  *If you need a refill on your cardiac medications before your next appointment, please call your pharmacy*   Lab Work: None ordered  If you have labs (blood work) drawn today and your tests are completely normal, you will receive your results only by: MyChart Message (if you have MyChart) OR A paper copy in the mail If you have any lab test that is abnormal or we need to change your treatment, we will call you to review the results.   Testing/Procedures: None ordered   Follow-Up: At [facility], you and your health needs are our priority.  As part of our continuing mission to provide you with exceptional heart care, we have created designated Provider Care Teams.  These Care Teams include your primary Cardiologist (physician) and Advanced Practice Providers (APPs -  Physician Assistants and Nurse Practitioners) who all work together to provide you with the care you need, when you need it.  We recommend signing up for the patient portal called "MyChart".  Sign up information is provided on this After Visit Summary.  MyChart is used to connect with patients for Virtual Visits (Telemedicine).  Patients are able to view lab/test results, encounter notes, upcoming appointments, etc.  Non-urgent messages can be sent to your provider as well.   To learn more about what you can do with MyChart, go to [email or web address].    Your next appointment:   As needed  The format for your next appointment:   In Person  Provider:   You may see Dietrich Pates, MD or one of the following Advanced Practice Providers on your designated Care Team:   Tereso Newcomer, PA-C Chelsea Aus, [place]   Other Instructions

## 2021-09-13 NOTE — Progress Notes (Signed)
Cardiology Office Note:    Date:  09/13/2021   ID:  Melissa [name], DOB 1986-02-05, MRN [id number]  PCP:  Pcp, No  [facility] Cardiologist:  Dietrich Pates, MD  [facility] Electrophysiologist:  None   Chief Complaint: study follow up   History of Present Illness:    Melissa [name] is a 35 y.o. female with a hx of Inferior STEMI with normal coronaries, Postpartum hypertension and palpitations presents for follow up.   Admitted February 2020 with active chest pain and EKG showing inferior lateral ST elevation. No family history of CAD. She was taken to Cath Lab and initially an attempt to radial access was unsuccessful because of spasm. Cath via groin access showed no evidence of coronary artery disease. Pain relieved with morphine. Echocardiogram with preserved LV function. Dr. Tenny Craw did not fall symptoms related to pericarditis but could be costochondritis.   Seen by me 05/29/20 for axillary intermittent pain and questionable murmur while 3 month pregnant. This is her fourth pregnancy. No prior pregnancy complications. Poor PO intake in setting of UTI. Advised hydration and PRN follow up.   Patient had postpartum hypertension after second pregnancy requiring amlodipine for 1 year. Also after 4th pregnancy requiring amlodipine.    Seen for palpitations 06/2021.  Each episode lasting for about 1 minutes occurring every few days.  Follow up echo with normal LVEF and no valvular abnormality. Monitor without arrhythmias.   Seen for follow-up today.  No complaints.  Finally able to find a place to live.  Denies chest pain, shortness of breath,, orthopnea, PND, syncope or lower extremity edema.  Very seldom here and there heart racing but nothing significant.  No syncope.   Past Medical History:  Diagnosis Date   Abnormal Pap smear 2010   Repeat pap done;Hysteroscopy;Last pap 2012;was normal   Anemia 2010   Iron supplements PP   Asthma    Has a nebulizer;triggered by stress   Chest pain     Colon polyps    Headache(784.0)    Heart attack [facility])    MD thinks may have had a heart attack;Cardiologist @ [facility]   History of CVA (cerebrovascular accident) 04/09/2013   Pt reports presumptive "stroke" when she was 35y.o.; followed by [facility] neurology for "chronic nerve pain" and has been Rx'd Hydrocodone and Mobic   Hypertension    Infection    BV;may get frequently, UTI   Multiple food allergies    Nerve pain    Chronic;d/t possible stroke and MI   Placenta previa antepartum 2010   Placenta moved 3 days before baby was born   Pre-diabetes    Shortness of breath    Sickle cell trait (HCC)    Stroke [facility]) 2013   MD thought may have had a stroke and heart attack;has neurologist @ [facility]   Vitamin D deficiency     Past Surgical History:  Procedure Laterality Date   HYSTEROSCOPY  2010   LEFT HEART CATH AND CORONARY ANGIOGRAPHY N/A 02/18/2019   Procedure: LEFT HEART CATH AND CORONARY ANGIOGRAPHY;  Surgeon: [name] Lex, MD;  Location: [facility] INVASIVE CV LAB;  Service: Cardiovascular;  Laterality: N/A;    Current Medications: Current Meds  Medication Sig   JUNEL FE 1.5/30 1.5-30 MG-MCG tablet Take by mouth.     Allergies:   Ginger   Social History   Socioeconomic History   Marital status: Legally Separated    Spouse name: Apolinar Junes   Number of children: 3   Years  of education: 15   Highest education level: Not on file  Occupational History   Not on file  Tobacco Use   Smoking status: Never   Smokeless tobacco: Never  Vaping Use   Vaping Use: Never used  Substance and Sexual Activity   Alcohol use: Not Currently    Alcohol/week: 0.0 standard drinks    Comment: last use 03/16/2020   Drug use: No   Sexual activity: Yes    Birth control/protection: None  Other Topics Concern   Not on file  Social History Narrative   Lives at home with her three children.   Right-handed.   1 cup caffeine daily.   Social Determinants of Health   Financial  Resource Strain: Not on file  Food Insecurity: Not on file  Transportation Needs: Not on file  Physical Activity: Not on file  Stress: Not on file  Social Connections: Not on file     Family History: The patient's family history includes Anemia in her mother; Asthma in her brother, daughter, mother, and son; Cervical cancer in her paternal aunt and paternal grandmother; Diabetes in her mother, paternal aunt, paternal grandfather, and paternal grandmother; Hypertension in her mother; Sickle cell trait in her father.    ROS:   Please see the history of present illness.    All other systems reviewed and are negative.   EKGs/Labs/Other Studies Reviewed:    The following studies were reviewed today:  Monitor 07/2021 Patch Wear Time:  13 days and 20 hours (2022-07-14T09:29:47-0400 to 2022-07-28T06:18:49-0400)   Sinus rhythm   Rate 53 to 147 bpm  Average HR 88 bpm    Rare PAC, PVC   Few short bursts of SVT, longest 6 beats Triggered events/symtpoms correlated with SR      Echo 06/2021 1. Left ventricular ejection fraction, by estimation, is 55%. The left  ventricle has normal function. The left ventricle has no regional wall  motion abnormalities. Left ventricular diastolic parameters were normal.   2. Right ventricular systolic function is normal. The right ventricular  size is normal. Tricuspid regurgitation signal is inadequate for assessing  PA pressure.   3. The mitral valve is normal in structure. No evidence of mitral valve  regurgitation. No evidence of mitral stenosis.   4. The aortic valve is tricuspid. Aortic valve regurgitation is not  visualized. No aortic stenosis is present.   5. The inferior vena cava is normal in size with greater than 50%  respiratory variability, suggesting right atrial pressure of 3 mmHg.   Comparison(s): 07/20/19 EF 60-65%.     EKG:  EKG is not  ordered today.    Recent Labs: 10/31/2020: ALT 8; BUN <5; Creatinine, Ser 0.54; Potassium 3.4; Sodium  137 11/01/2020: Hemoglobin 9.8; Platelets 182 07/13/2021: TSH 0.644  Recent Lipid Panel    Component Value Date/Time   CHOL 131 04/25/2020 1254   TRIG 78 04/25/2020 1254   HDL 52 04/25/2020 1254   CHOLHDL 2.4 02/24/2020 0947   CHOLHDL 2.4 02/18/2019 1548   VLDL 7 02/18/2019 1548   LDLCALC 64 04/25/2020 1254     Physical Exam:    VS:  BP 126/60   Pulse 82   Ht 5\' 5"  (1.651 m)   Wt 199 lb 6.4 oz (90.4 kg)   SpO2 95%   BMI 33.18 kg/m     Wt Readings from Last 3 Encounters:  09/13/21 199 lb 6.4 oz (90.4 kg)  07/13/21 198 lb (89.8 kg)  10/31/20 194 lb 8 oz (88.2  kg)     GEN:  Well nourished, well developed in no acute distress HEENT: Normal NECK: No JVD; No carotid bruits LYMPHATICS: No lymphadenopathy CARDIAC: RRR, no murmurs, rubs, gallops RESPIRATORY:  Clear to auscultation without rales, wheezing or rhonchi  ABDOMEN: Soft, non-tender, non-distended MUSCULOSKELETAL:  No edema; No deformity  SKIN: Warm and dry NEUROLOGIC:  Alert and oriented x 3 PSYCHIATRIC:  Normal affect   ASSESSMENT AND PLAN:    Palpitation Echocardiogram and monitor reassuring as above.  Symptoms slightly better with improved stress in life.  No syncope.  Encourage hydration and relaxing technique.  Discussed deep breathing.  2.  Postpartum hypertension -Currently on amlodipine  Medication Adjustments/Labs and Tests Ordered: Current medicines are reviewed at length with the patient today.  Concerns regarding medicines are outlined above.  No orders of the defined types were placed in this encounter.  No orders of the defined types were placed in this encounter.   Patient Instructions  Medication Instructions:  Your physician recommends that you continue on your current medications as directed. Please refer to the Current Medication list given to you today.  *If you need a refill on your cardiac medications before your next appointment, please call your pharmacy*   Lab Work: None  ordered  If you have labs (blood work) drawn today and your tests are completely normal, you will receive your results only by: MyChart Message (if you have MyChart) OR A paper copy in the mail If you have any lab test that is abnormal or we need to change your treatment, we will call you to review the results.   Testing/Procedures: None ordered   Follow-Up: At [facility], you and your health needs are our priority.  As part of our continuing mission to provide you with exceptional heart care, we have created designated Provider Care Teams.  These Care Teams include your primary Cardiologist (physician) and Advanced Practice Providers (APPs -  Physician Assistants and Nurse Practitioners) who all work together to provide you with the care you need, when you need it.  We recommend signing up for the patient portal called "MyChart".  Sign up information is provided on this After Visit Summary.  MyChart is used to connect with patients for Virtual Visits (Telemedicine).  Patients are able to view lab/test results, encounter notes, upcoming appointments, etc.  Non-urgent messages can be sent to your provider as well.   To learn more about what you can do with MyChart, go to [email or web address].    Your next appointment:   As needed  The format for your next appointment:   In Person  Provider:   You may see Dietrich Pates, MD or one of the following Advanced Practice Providers on your designated Care Team:   Tereso Newcomer, PA-C Chelsea Aus, PA-C   Other Instructions    Signed, Manson Passey, [place]  09/13/2021 11:33 AM    [facility]

## 2022-08-08 ENCOUNTER — Encounter (INDEPENDENT_AMBULATORY_CARE_PROVIDER_SITE_OTHER): Payer: Self-pay

## 2022-10-15 LAB — HM PAP SMEAR: HM Pap smear: NORMAL

## 2023-01-21 ENCOUNTER — Telehealth: Payer: Self-pay | Admitting: Internal Medicine

## 2023-01-21 NOTE — Telephone Encounter (Signed)
   Pre-operative Risk Assessment    Patient Name: Melissa [name]  DOB: 1986-03-14 MRN: [id number]      Request for Surgical Clearance    Procedure:   ROBOTIC HYSTERECTOMY   Date of Surgery:  Clearance TBD                                 Surgeon:  DR. Ivin Poot Surgeon's Group or Practice Name:  [facility] Phone number:  [phone]  Fax number:  [phone]     Type of Clearance Requested:   - Medical    Type of Anesthesia:  General    Additional requests/questions:    Astrid Divine   01/21/2023, 4:22 PM

## 2023-01-21 NOTE — Telephone Encounter (Signed)
Dr. Gailen Shelter states she is returning a call about a clearance.

## 2023-01-22 NOTE — Telephone Encounter (Signed)
   Name: Melissa [name]  DOB: 09/05/1986  MRN: [id number]  Primary Cardiologist: Dorris Carnes, MD  Chart reviewed as part of pre-operative protocol coverage. Because of Melissa [name]'s past medical history and time since last visit, she will require a follow-up in-office visit in order to better assess preoperative cardiovascular risk.  Pre-op covering staff: - Please schedule appointment and call patient to inform them. If patient already had an upcoming appointment within acceptable timeframe, please add "pre-op clearance" to the appointment notes so provider is aware. - Please contact requesting surgeon's office via preferred method (i.e, phone, fax) to inform them of need for appointment prior to surgery.   Lenna Sciara, NP  01/22/2023, 11:53 AM

## 2023-01-23 NOTE — Telephone Encounter (Signed)
Left message for the pt to call back and schedule an appt in the office for pre op clearance.

## 2023-01-23 NOTE — Telephone Encounter (Signed)
Patient returned call to schedule her preop appt. She is scheduled for 01/29/23 at 8:50am.

## 2023-01-28 NOTE — Progress Notes (Unsigned)
Office Visit    Patient Name: Melissa [name] Date of Encounter: 01/29/2023  Primary Care Provider:  Pcp, No Primary Cardiologist:  Dorris Carnes, MD Primary Electrophysiologist: None  Chief Complaint    Melissa [name] is a 37 y.o. female with PMH of postpartum HTN, asthma, chest pain with inferior lateral ST elevation s/p LHC normal coronaries who presents today for preoperative clearance.  Past Medical History    Past Medical History:  Diagnosis Date   Abnormal Pap smear 2010   Repeat pap done;Hysteroscopy;Last pap 2012;was normal   Anemia 2010   Iron supplements PP   Asthma    Has a nebulizer;triggered by stress   Chest pain    Colon polyps    Headache(784.0)    Heart attack [facility])    MD thinks may have had a heart attack;Cardiologist @ [facility]   History of CVA (cerebrovascular accident) 04/09/2013   Pt reports presumptive "stroke" when she was 37y.o.; followed by [facility] neurology for "chronic nerve pain" and has been Rx'd Hydrocodone and Mobic   Hypertension    Infection    BV;may get frequently, UTI   Multiple food allergies    Nerve pain    Chronic;d/t possible stroke and MI   Placenta previa antepartum 2010   Placenta moved 3 days before baby was born   Pre-diabetes    Shortness of breath    Sickle cell trait ([place])    Stroke [facility]) 2013   MD thought may have had a stroke and heart attack;has neurologist @ [facility]   Vitamin D deficiency    Past Surgical History:  Procedure Laterality Date   HYSTEROSCOPY  2010   LEFT HEART CATH AND CORONARY ANGIOGRAPHY N/A 02/18/2019   Procedure: LEFT HEART CATH AND CORONARY ANGIOGRAPHY;  Surgeon: Leonie Man, MD;  Location: [facility] CV LAB;  Service: Cardiovascular;  Laterality: N/A;    Allergies  Allergies  Allergen Reactions   Ginger Anaphylaxis    History of Present Illness    Melissa [name]  is a 37year old female with the above mention past medical history who presents today for preoperative  clearance.  Ms. Luster was initially seen in 2020 when she presented to the ED with complaint of active chest pain and found to have inferior ST elevation.  Code STEMI was activated with emergent cath completed that showed normal coronaries.  2D echo was completed showing EF of 60 to 65% with no RWMA and normal valvular structure.  She was discharged with amlodipine due to possible vasospasm versus costochondritis.  She was last seen in follow-up due to global pandemic.  She was seen on 07/13/2021 by Robbie Lis, PA for complaint of palpitations associated with shortness of breath following her most recent pregnancy.  She underwent 2D echo to rule out postpartum cardiomyopathy that showed EF of 55% with no RWMA or LVH with normal valve function.  14-day ZIO monitor was worn that showed sinus rhythm with no significant arrhythmias and rare PACs and PVCs.  She was last seen on 09/13/2021 with improvement to symptoms and BP well-controlled.  Ms. Haertel presents today for preoperative clearance alone.  Since last being seen in the office patient reports that she is experienced intermittent chest pain that occurs with and without activity.  She denies any shortness of breath or dizziness and reports that her discomfort gets better with rest.  Her blood pressure today at goal of 130/80.  She does not check her blood pressures at  home.  She was previously on amlodipine following her pregnancy due to hypertension and has not been on any antihypertensive since 2021.  She reports that she does not eat some days due to her busy schedule with her children.  She has some indiscretions with sodium and we discussed importance of abstaining from excess salt in her diet.  She has history of hyperlipidemia on her mom side and prediabetes on mother and father side.  She reports that her mother was adopted and does not know her family's medical history.  She has patient denies chest pain, palpitations, dyspnea, PND, orthopnea, nausea,  vomiting, dizziness, syncope, edema, weight gain, or early satiety.  Home Medications    No current outpatient medications on file.   No current facility-administered medications for this visit.     Review of Systems  Please see the history of present illness.    (+) Chest discomfort (+) Palpitations  All other systems reviewed and are otherwise negative except as noted above.  Physical Exam    Wt Readings from Last 3 Encounters:  01/29/23 218 lb (98.9 kg)  09/13/21 199 lb 6.4 oz (90.4 kg)  07/13/21 198 lb (89.8 kg)   VS: Vitals:   01/29/23 0849  BP: 130/80  Pulse: 92  SpO2: 99%  ,Body mass index is 36.28 kg/m.  Constitutional:      Appearance: Healthy appearance. Not in distress.  Neck:     Vascular: JVD normal.  Pulmonary:     Effort: Pulmonary effort is normal.     Breath sounds: No wheezing. No rales. Diminished in the bases Cardiovascular:     Normal rate. Regular rhythm. Normal S1. Normal S2.      Murmurs: There is no murmur.  Edema:    Peripheral edema absent.  Abdominal:     Palpations: Abdomen is soft non tender. There is no hepatomegaly.  Skin:    General: Skin is warm and dry.  Neurological:     General: No focal deficit present.     Mental Status: Alert and oriented to person, place and time.     Cranial Nerves: Cranial nerves are intact.  EKG/LABS/Other Studies Reviewed    ECG personally reviewed by me today -sinus rhythm with rate of 92 bpm and no acute changes consistent with previous EKG.  Lab Results  Component Value Date   WBC 9.0 11/01/2020   HGB 9.8 (L) 11/01/2020   HCT 32.1 (L) 11/01/2020   MCV 81.3 11/01/2020   PLT 182 11/01/2020   Lab Results  Component Value Date   CREATININE 0.54 10/31/2020   BUN <5 (L) 10/31/2020   NA 137 10/31/2020   K 3.4 (L) 10/31/2020   CL 108 10/31/2020   CO2 19 (L) 10/31/2020   Lab Results  Component Value Date   ALT 8 10/31/2020   AST 15 10/31/2020   ALKPHOS 126 10/31/2020   BILITOT 1.0  10/31/2020   Lab Results  Component Value Date   CHOL 131 04/25/2020   HDL 52 04/25/2020   LDLCALC 64 04/25/2020   TRIG 78 04/25/2020   CHOLHDL 2.4 02/24/2020    Lab Results  Component Value Date   HGBA1C 5.2 04/25/2020    Assessment & Plan    -Preoperative clearance not granted at this time due to patient's complaint of chest pain with and without activity.  She will undergo treadmill stress test for further evaluation and to rule out ischemia prior to hysterectomy. 1.  Preoperative clearance: -  Recommendations:  Antiplatelet and/or Anticoagulation Recommendations:      2.  Palpitations: -Patient reported previous palpitations with 14-day ZIO monitor worn and 2022 with no significant arrhythmias noted. -Today patient reports continued palpitations -We will start amlodipine 5 mg daily today  3.  Postpartum hypertension: -Today patient's blood pressure was at baseline of 130/80 however she reports palpitations and elevated BP at home. -We will initiate antihypertensive as noted above. -She will obtain BP cuff and begin checking blood pressures and will return readings to office and 2 weeks   4.  Severe persistent asthma: -Currently controlled -Avoid nonselective beta-blockers  Disposition: Follow-up with Dorris Carnes, MD or APP in as scheduled months Shared Decision Making/Informed Consent The risks [chest pain, shortness of breath, cardiac arrhythmias, dizziness, blood pressure fluctuations, myocardial infarction, stroke/transient ischemic attack, nausea, vomiting, allergic reaction, radiation exposure, metallic taste sensation and life-threatening complications (estimated to be 1 in 10,000)], benefits (risk stratification, diagnosing coronary artery disease, treatment guidance) and alternatives of a nuclear stress test were discussed in detail with Ms. Coval and she agrees to proceed.   Medication Adjustments/Labs and Tests Ordered: Current medicines are reviewed  at length with the patient today.  Concerns regarding medicines are outlined above.   Signed, Mable Fill, Marissa Nestle, NP 01/29/2023, 9:14 AM [facility]  Note:  This document was prepared using Dragon voice recognition software and may include unintentional dictation errors.

## 2023-01-29 ENCOUNTER — Encounter: Payer: Self-pay | Admitting: Nurse Practitioner

## 2023-01-29 ENCOUNTER — Ambulatory Visit: Payer: Medicaid Other | Attending: Nurse Practitioner | Admitting: Nurse Practitioner

## 2023-01-29 VITALS — BP 130/80 | HR 92 | Ht 65.0 in | Wt 218.0 lb

## 2023-01-29 DIAGNOSIS — R002 Palpitations: Secondary | ICD-10-CM | POA: Diagnosis not present

## 2023-01-29 DIAGNOSIS — Z01810 Encounter for preprocedural cardiovascular examination: Secondary | ICD-10-CM

## 2023-01-29 DIAGNOSIS — I10 Essential (primary) hypertension: Secondary | ICD-10-CM | POA: Diagnosis not present

## 2023-01-29 DIAGNOSIS — J4552 Severe persistent asthma with status asthmaticus: Secondary | ICD-10-CM | POA: Diagnosis not present

## 2023-01-29 MED ORDER — AMLODIPINE BESYLATE 5 MG PO TABS: 5.0000 mg | ORAL_TABLET | Freq: Every day | ORAL | 6 refills | Status: DC

## 2023-01-29 NOTE — Patient Instructions (Addendum)
Medication Instructions:  RESTART Norvasc (Amlodipine) 5mg  Take 1 tablet once a day  *If you need a refill on your cardiac medications before your next appointment, please call your pharmacy*   Lab Work: TODAY-BMET, LFT, LIPID, A1C If you have labs (blood work) drawn today and your tests are completely normal, you will receive your results only by: [place] (if you have MyChart) OR A paper copy in the mail If you have any lab test that is abnormal or we need to change your treatment, we will call you to review the results.   Testing/Procedures: Your physician has requested that you have en exercise stress myoview. For further information please visit [email or web address]. Please follow instruction sheet, as given.   Follow-Up: At [facility], you and your health needs are our priority.  As part of our continuing mission to provide you with exceptional heart care, we have created designated Provider Care Teams.  These Care Teams include your primary Cardiologist (physician) and Advanced Practice Providers (APPs -  Physician Assistants and Nurse Practitioners) who all work together to provide you with the care you need, when you need it.  We recommend signing up for the patient portal called "MyChart".  Sign up information is provided on this After Visit Summary.  MyChart is used to connect with patients for Virtual Visits (Telemedicine).  Patients are able to view lab/test results, encounter notes, upcoming appointments, etc.  Non-urgent messages can be sent to your provider as well.   To learn more about what you can do with MyChart, go to [email or web address].    Your next appointment:   6 month(s)  Provider:   Ambrose Pancoast, NP      Other Instructions CHECK YOUR BLOOD PRESSURE DAILY FOR 2 WEEKS AND SEND YOUR READINGS TO [place] 8-36mmHg Darleen Crocker, NP at [facility] [phone] just call and schedule a  new patient appt

## 2023-01-30 ENCOUNTER — Telehealth (HOSPITAL_COMMUNITY): Payer: Self-pay | Admitting: *Deleted

## 2023-01-30 LAB — HEMOGLOBIN A1C
Est. average glucose Bld gHb Est-mCnc: 117 mg/dL
Hgb A1c MFr Bld: 5.7 % — ABNORMAL HIGH (ref 4.8–5.6)

## 2023-01-30 LAB — BASIC METABOLIC PANEL
BUN/Creatinine Ratio: 9 (ref 9–23)
BUN: 8 mg/dL (ref 6–20)
CO2: 21 mmol/L (ref 20–29)
Calcium: 9.7 mg/dL (ref 8.7–10.2)
Chloride: 103 mmol/L (ref 96–106)
Creatinine, Ser: 0.88 mg/dL (ref 0.57–1.00)
Glucose: 101 mg/dL — ABNORMAL HIGH (ref 70–99)
Potassium: 4.3 mmol/L (ref 3.5–5.2)
Sodium: 140 mmol/L (ref 134–144)
eGFR: 88 mL/min/{1.73_m2} (ref >59)

## 2023-01-30 LAB — HEPATIC FUNCTION PANEL
ALT: 26 IU/L (ref 0–32)
AST: 26 IU/L (ref 0–40)
Albumin: 4.4 g/dL (ref 3.9–4.9)
Alkaline Phosphatase: 95 IU/L (ref 44–121)
Bilirubin Total: 0.5 mg/dL (ref 0.0–1.2)
Bilirubin, Direct: 0.13 mg/dL (ref 0.00–0.40)
Total Protein: 7.8 g/dL (ref 6.0–8.5)

## 2023-01-30 LAB — LIPID PANEL
Chol/HDL Ratio: 2.4 ratio (ref 0.0–4.4)
Cholesterol, Total: 137 mg/dL (ref 100–199)
HDL: 56 mg/dL (ref >39)
LDL Chol Calc (NIH): 70 mg/dL (ref 0–99)
Triglycerides: 51 mg/dL (ref 0–149)
VLDL Cholesterol Cal: 11 mg/dL (ref 5–40)

## 2023-01-30 NOTE — Telephone Encounter (Signed)
Called patient on 01/30/23 regarding her Stress test for 02/01/23 at 7:15. Not able to leave message. No DPR on file. Will try later to reach patient and give detailed instructions.

## 2023-01-30 NOTE — Telephone Encounter (Signed)
Pt reached and given instructions for MPI study scheduled on 02/01/23.

## 2023-02-01 ENCOUNTER — Encounter (HOSPITAL_COMMUNITY): Payer: Medicaid Other

## 2023-02-04 ENCOUNTER — Telehealth: Payer: Self-pay

## 2023-02-04 DIAGNOSIS — Z01810 Encounter for preprocedural cardiovascular examination: Secondary | ICD-10-CM

## 2023-02-04 DIAGNOSIS — I10 Essential (primary) hypertension: Secondary | ICD-10-CM

## 2023-02-04 DIAGNOSIS — R002 Palpitations: Secondary | ICD-10-CM

## 2023-02-04 DIAGNOSIS — R079 Chest pain, unspecified: Secondary | ICD-10-CM

## 2023-02-04 NOTE — Telephone Encounter (Signed)
-----   Message from Marylu Lund., NP sent at 02/04/2023  1:14 PM EST ----- Regarding: RE: AUTH DENIAL Please cancel patient's myocardial perfusion scan.  Please contact patient to see if she is interested in performing a treadmill test instead for clearance purposes.  If she is interested we will go ahead and order this test in place of the perfusion scan.  Please let me know if you have any additional questions Ambrose Pancoast, MD ----- Message ----- From: Leanord Asal Sent: 02/04/2023   9:14 AM EST To: Marylu Lund., NP; Lorenza Evangelist, RN; # Subject: RE: Theophilus Bones                                No problem. I'll contact the patient. Thanks! ----- Message ----- From: Sharol Harness Sent: 02/04/2023   9:11 AM EST To: Marylu Lund., NP; Lorenza Evangelist, RN; # Subject: Theophilus Bones                                    Good morning,   [employer] has denied pt's auth. I haven't faxed an appeal yet because I couldn't find any of the risk factors in chart. Roderic Ovens are you able to reschedule?  Following are the details.  Here are the policy requirements your request did not meet: Your doctor told us that there is a concern related to the blood vessels in your heart. It must be  needed to determine if a high or intermediate risk surgery your doctor is considering would put  your heart at risk. You must have one of the following risk factors for which you are unable to perform a treadmill  test (a tracing of your heart's actions (electrocardiogram or ECG) and blood pressure check  done before, during, and after walking on a treadmill).  -A history of cardiac ischemia (decreased oxygen to your heart muscle),  -A type of heart failure where your heart works well enough that you either do not notice any  problems or the symptoms are easy to manage.  -A history of stroke or TIA (transient ischemic attack). This is when blood flow to a part of your  brain is cut off.  -Diabetes Mellitus  (a problem involving controlling your blood's sugar levels). -Creatinine (a blood test to check your kidney function) of two mg/dl (milligrams per deciliter).  This finding was based on review of [employer] Cardiac Imaging Guidelines Section(s):  Stress Testing with Imaging - Preoperative (CD 1.5).  Thanks, [employer]

## 2023-02-04 NOTE — Telephone Encounter (Signed)
Patient agreeable with treadmill test.   ETT ordered.

## 2023-02-05 ENCOUNTER — Encounter (HOSPITAL_COMMUNITY): Payer: Medicaid Other

## 2023-02-12 ENCOUNTER — Ambulatory Visit: Payer: Medicaid Other | Attending: Nurse Practitioner

## 2023-02-14 ENCOUNTER — Ambulatory Visit: Payer: Medicaid Other | Attending: Internal Medicine

## 2023-02-14 DIAGNOSIS — R002 Palpitations: Secondary | ICD-10-CM

## 2023-02-14 DIAGNOSIS — Z01810 Encounter for preprocedural cardiovascular examination: Secondary | ICD-10-CM

## 2023-02-14 DIAGNOSIS — I10 Essential (primary) hypertension: Secondary | ICD-10-CM | POA: Diagnosis not present

## 2023-02-14 DIAGNOSIS — R079 Chest pain, unspecified: Secondary | ICD-10-CM | POA: Diagnosis not present

## 2023-02-14 LAB — EXERCISE TOLERANCE TEST
Angina Index: 0
Base ST Depression (mm): 0 mm
Duke Treadmill Score: 8
Estimated workload: 10.1
Exercise duration (min): 8 min
Exercise duration (sec): 0 s
MPHR: 185 {beats}/min
Peak HR: 173 {beats}/min
Percent HR: 93 %
RPE: 16
Rest HR: 93 {beats}/min
ST Depression (mm): 0 mm

## 2023-02-15 ENCOUNTER — Observation Stay (HOSPITAL_BASED_OUTPATIENT_CLINIC_OR_DEPARTMENT_OTHER): Payer: Medicaid Other

## 2023-02-15 ENCOUNTER — Emergency Department (HOSPITAL_COMMUNITY): Payer: Medicaid Other

## 2023-02-15 ENCOUNTER — Other Ambulatory Visit: Payer: Self-pay

## 2023-02-15 ENCOUNTER — Telehealth: Payer: Self-pay | Admitting: Nurse Practitioner

## 2023-02-15 ENCOUNTER — Encounter (HOSPITAL_COMMUNITY): Payer: Self-pay

## 2023-02-15 ENCOUNTER — Observation Stay (HOSPITAL_COMMUNITY)
Admission: EM | Admit: 2023-02-15 | Discharge: 2023-02-17 | Disposition: A | Discharge status: Home / Self Care | Payer: Medicaid Other | Attending: Internal Medicine | Admitting: Internal Medicine

## 2023-02-15 DIAGNOSIS — I2699 Other pulmonary embolism without acute cor pulmonale: Secondary | ICD-10-CM

## 2023-02-15 DIAGNOSIS — I2609 Other pulmonary embolism with acute cor pulmonale: Secondary | ICD-10-CM | POA: Diagnosis not present

## 2023-02-15 DIAGNOSIS — R0789 Other chest pain: Secondary | ICD-10-CM | POA: Diagnosis present

## 2023-02-15 DIAGNOSIS — I10 Essential (primary) hypertension: Secondary | ICD-10-CM | POA: Insufficient documentation

## 2023-02-15 DIAGNOSIS — Z79899 Other long term (current) drug therapy: Secondary | ICD-10-CM | POA: Insufficient documentation

## 2023-02-15 DIAGNOSIS — Z8673 Personal history of transient ischemic attack (TIA), and cerebral infarction without residual deficits: Secondary | ICD-10-CM | POA: Diagnosis not present

## 2023-02-15 DIAGNOSIS — J45909 Unspecified asthma, uncomplicated: Secondary | ICD-10-CM | POA: Insufficient documentation

## 2023-02-15 LAB — PROTIME-INR
INR: 1.2 (ref 0.8–1.2)
Prothrombin Time: 15.5 seconds — ABNORMAL HIGH (ref 11.4–15.2)

## 2023-02-15 LAB — BASIC METABOLIC PANEL
Anion gap: 10 (ref 5–15)
BUN: 6 mg/dL (ref 6–20)
CO2: 21 mmol/L — ABNORMAL LOW (ref 22–32)
Calcium: 9.1 mg/dL (ref 8.9–10.3)
Chloride: 106 mmol/L (ref 98–111)
Creatinine, Ser: 0.83 mg/dL (ref 0.44–1.00)
GFR, Estimated: >60 mL/min (ref >60)
Glucose, Bld: 119 mg/dL — ABNORMAL HIGH (ref 70–99)
Potassium: 3.6 mmol/L (ref 3.5–5.1)
Sodium: 137 mmol/L (ref 135–145)

## 2023-02-15 LAB — TSH: TSH: 1.195 u[IU]/mL (ref 0.350–4.500)

## 2023-02-15 LAB — APTT: aPTT: 53 seconds — ABNORMAL HIGH (ref 24–36)

## 2023-02-15 LAB — HIV ANTIBODY (ROUTINE TESTING W REFLEX): HIV Screen 4th Generation wRfx: NONREACTIVE

## 2023-02-15 LAB — CBC
HCT: 41.7 % (ref 36.0–46.0)
Hemoglobin: 13.2 g/dL (ref 12.0–15.0)
MCH: 26.6 pg (ref 26.0–34.0)
MCHC: 31.7 g/dL (ref 30.0–36.0)
MCV: 83.9 fL (ref 80.0–100.0)
Platelets: 288 10*3/uL (ref 150–400)
RBC: 4.97 MIL/uL (ref 3.87–5.11)
RDW: 14.6 % (ref 11.5–15.5)
WBC: 6.3 10*3/uL (ref 4.0–10.5)
nRBC: 0 % (ref 0.0–0.2)

## 2023-02-15 LAB — CBG MONITORING, ED: Glucose-Capillary: 97 mg/dL (ref 70–99)

## 2023-02-15 LAB — TROPONIN I (HIGH SENSITIVITY)
Troponin I (High Sensitivity): <2 ng/L (ref <18)
Troponin I (High Sensitivity): 2 ng/L (ref <18)

## 2023-02-15 LAB — I-STAT BETA HCG BLOOD, ED (MC, WL, AP ONLY): I-stat hCG, quantitative: <5 m[IU]/mL (ref <5)

## 2023-02-15 MED ORDER — IBUPROFEN 400 MG PO TABS: 400.0000 mg | ORAL_TABLET | Freq: Two times a day (BID) | ORAL | Status: DC | PRN

## 2023-02-15 MED ORDER — APIXABAN 5 MG PO TABS: 5.0000 mg | ORAL_TABLET | Freq: Two times a day (BID) | ORAL | Status: DC

## 2023-02-15 MED ORDER — ONDANSETRON HCL 4 MG/2ML IJ SOLN
4.0000 mg | Freq: Once | INTRAMUSCULAR | Status: AC
  Administered 2023-02-15: 4 mg via INTRAVENOUS
  Filled 2023-02-15: qty 2

## 2023-02-15 MED ORDER — ACETAMINOPHEN 650 MG RE SUPP: 650.0000 mg | Freq: Four times a day (QID) | RECTAL | Status: DC | PRN

## 2023-02-15 MED ORDER — ACETAMINOPHEN 325 MG PO TABS
650.0000 mg | ORAL_TABLET | Freq: Four times a day (QID) | ORAL | Status: DC | PRN
  Administered 2023-02-15 – 2023-02-17 (×6): 650 mg via ORAL
  Filled 2023-02-15 (×6): qty 2

## 2023-02-15 MED ORDER — HEPARIN BOLUS VIA INFUSION
5000.0000 [IU] | Freq: Once | INTRAVENOUS | Status: AC
  Administered 2023-02-15: 5000 [IU] via INTRAVENOUS
  Filled 2023-02-15: qty 5000

## 2023-02-15 MED ORDER — ALBUTEROL SULFATE (2.5 MG/3ML) 0.083% IN NEBU: 2.5000 mg | INHALATION_SOLUTION | RESPIRATORY_TRACT | Status: DC | PRN

## 2023-02-15 MED ORDER — MORPHINE SULFATE (PF) 2 MG/ML IV SOLN
2.0000 mg | Freq: Once | INTRAVENOUS | Status: AC
  Administered 2023-02-15: 2 mg via INTRAVENOUS
  Filled 2023-02-15: qty 1

## 2023-02-15 MED ORDER — HEPARIN (PORCINE) 25000 UT/250ML-% IV SOLN
1400.0000 [IU]/h | INTRAVENOUS | Status: DC
  Administered 2023-02-15: 1400 [IU]/h via INTRAVENOUS
  Filled 2023-02-15: qty 250

## 2023-02-15 MED ORDER — TRAMADOL HCL 50 MG PO TABS
50.0000 mg | ORAL_TABLET | Freq: Two times a day (BID) | ORAL | Status: DC | PRN
  Administered 2023-02-15 – 2023-02-16 (×2): 50 mg via ORAL
  Filled 2023-02-15 (×2): qty 1

## 2023-02-15 MED ORDER — ONDANSETRON HCL 4 MG PO TABS: 4.0000 mg | ORAL_TABLET | Freq: Four times a day (QID) | ORAL | Status: DC | PRN

## 2023-02-15 MED ORDER — IOHEXOL 350 MG/ML SOLN
55.0000 mL | Freq: Once | INTRAVENOUS | Status: AC | PRN
  Administered 2023-02-15: 55 mL via INTRAVENOUS

## 2023-02-15 MED ORDER — SODIUM CHLORIDE 0.9 % IV BOLUS
1000.0000 mL | Freq: Once | INTRAVENOUS | Status: AC
  Administered 2023-02-15: 1000 mL via INTRAVENOUS

## 2023-02-15 MED ORDER — APIXABAN 5 MG PO TABS
10.0000 mg | ORAL_TABLET | Freq: Two times a day (BID) | ORAL | Status: DC
  Administered 2023-02-15 – 2023-02-17 (×5): 10 mg via ORAL
  Filled 2023-02-15 (×5): qty 2

## 2023-02-15 MED ORDER — ONDANSETRON HCL 4 MG/2ML IJ SOLN: 4.0000 mg | Freq: Four times a day (QID) | INTRAMUSCULAR | Status: DC | PRN

## 2023-02-15 NOTE — ED Triage Notes (Addendum)
Patient had a stress test yesterday and woke up this am complaining of chest pain and feeling like she has been hit by a truck. Patient acts as though she can't answer questions and body is flaccid.  Reports she didn't eat anything last night or this am.

## 2023-02-15 NOTE — Progress Notes (Signed)
ANTICOAGULATION CONSULT NOTE - Initial Consult  Pharmacy Consult for heparin Indication: pulmonary embolus  Allergies  Allergen Reactions   Ginger Anaphylaxis    Patient Measurements: Height: 5' 5"$  (165.1 cm) Weight: 98.9 kg (218 lb) IBW/kg (Calculated) : 57 Heparin Dosing Weight: 80kg  Vital Signs: Temp: 98.9 F (37.2 C) (02/16 0928) Temp Source: Oral (02/16 0928) BP: 132/100 (02/16 0928) Pulse Rate: 123 (02/16 0928)  Labs: Recent Labs    02/15/23 0936  HGB 13.2  HCT 41.7  PLT 288  CREATININE 0.83  TROPONINIHS <2    Estimated Creatinine Clearance: 110.2 mL/min (by C-G formula based on SCr of 0.83 mg/dL).   Medical History: Past Medical History:  Diagnosis Date   Abnormal Pap smear 2010   Repeat pap done;Hysteroscopy;Last pap 2012;was normal   Anemia 2010   Iron supplements PP   Asthma    Has a nebulizer;triggered by stress   Chest pain    Colon polyps    Headache(784.0)    Heart attack [facility])    MD thinks may have had a heart attack;Cardiologist @ [facility]   History of CVA (cerebrovascular accident) 04/09/2013   Pt reports presumptive "stroke" when she was 37y.o.; followed by [facility] neurology for "chronic nerve pain" and has been Rx'd Hydrocodone and Mobic   Hypertension    Infection    BV;may get frequently, UTI   Multiple food allergies    Nerve pain    Chronic;d/t possible stroke and MI   Placenta previa antepartum 2010   Placenta moved 3 days before baby was born   Pre-diabetes    Shortness of breath    Sickle cell trait ([place])    Stroke ([place]) 2013   MD thought may have had a stroke and heart attack;has neurologist @ [facility]   Vitamin D deficiency    Assessment: 9 YOF presenting with CP and SOB, CT angio chest shows PE without right heart strain.  She is not on anticoagulation PTA.  Goal of Therapy:  Heparin level 0.3-0.7 units/ml Monitor platelets by anticoagulation protocol: Yes   Plan:  Heparin 5000 units IV x 1, and gtt  at 1400 units/hr F/u 6 hour heparin level F/u long term [facility] plan  Bertis Ruddy, PharmD, [place] Pharmacist ED Pharmacist Phone # [phone] 02/15/2023 1:18 PM

## 2023-02-15 NOTE — H&P (Cosign Needed Addendum)
Date: 02/15/2023               Patient Name:  Melissa [name] MRN: [id number]  DOB: 17-Apr-1986 Age / Sex: 37 y.o., female   PCP: Pcp, No         Medical Service: Internal Medicine Teaching Service         Attending Physician: Dr. Aldine Contes, MD    First Contact: [place] Nickel, MD      Pager: [phone]      Second Contact: Madelyn Flavors, MD      Pager: [phone]           After Hours (After 5p/  First Contact Pager: [phone]  weekends / holidays): Second Contact Pager: [phone]   SUBJECTIVE   Chief Complaint: Chest pain   History of Present Illness:  Ms Drain is a 37 year old female with a past medical history of sickle cell trait, obesity, hypertension, asthma, CVA, NSTEMI, and depression who presents with chest pain.  Patient was evaluated by cardiology yesterday 2-15 for surgical clearance and consideration of hysterectomy given her history of abnormal uterine bleeding. A couple hours after completing the cardiac stress test, she developed left-sided chest pain. Describes it as a sharp pain located behind the left breast and involving her armpit. Worsens with inhalation and sitting upright. Pain prevents deep inspiration and is contributing to breath shortness. She took her home albuterol nebulizer believing that she may have been experiencing an asthma attack, but her symptoms persisted. This morning, she developed nausea upon awakening and the left-sided chest pain was still present. At that time, she tried albuterol, which yet again proved ineffective. Called cardiology office and was directed to the emergency department for further evaluation. Denies fever, chills, cough, leg pain or swelling, and long periods of inactivity lasting greater than three hours.  Per patient, she has experienced two strokes, one in 2010 and another in 2014. She states that one of them was shortly after pregnancy, which may have contributed. Additionally, she reports experiencing a heart  attack in 2020. According to the patient, an obvious cause of these blood clots was neither investigated nor identified. Family history noncontributory, though maternal side is largely unknown.   ED Course: Upon arrival to the ED, vital signs were notable for HR 123 and BP 132/100. Laboratory testing demonstrated Bicarb 21, Gluc 119, Hgb 9.8, and Trop <2. Chest CT angiogram demonstrated left lingular and lower lobe pulmonary emboli without significant burden or right heart strain. Intravenous heparin started in the ED. Ondansetron, morphine, and 1L NS bolus were administered.   Meds:  No outpatient medications have been marked as taking for the 02/15/23 encounter [facility] Encounter).    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    OBJECTIVE:   Physical Exam: Blood pressure 121/65, pulse 92, temperature 97.9 F (36.6 C), temperature source Oral, resp. rate 20, height 5' 5"$  (1.651 m), weight 98.9 kg, SpO2 100 %, currently breastfeeding.   General:      awake and alert, lying comfortably in bed, cooperative, not in acute distress Skin:       warm and dry, intact without  any obvious lesions or scars, no rashes Lungs:      normal respiratory effort, breathing unlabored, symmetrical chest rise, no crackles or wheezing Cardiac:      tachycardic with regular rhythm, normal S1 and S2, no pitting edema or lower extremity swelling Abdomen:      soft and non-distended, normoactive bowel sounds, no tenderness to palpation or guarding Musculoskeletal:  no lower extremity erythema or tenderness Neurologic:      oriented to person-place-time, moving all extremities, no gross focal deficits Psychiatric:      euthymic mood with congruent affect, intelligible speech   Labs: CBC    Component Value Date/Time   WBC 6.3 02/15/2023 0936   RBC 4.97 02/15/2023 0936   HGB 13.2 02/15/2023 0936   HGB 13.3 02/24/2020 0947   HCT 41.7 02/15/2023 0936   HCT 40.5 02/24/2020 0947   PLT 288 02/15/2023 0936   PLT 222 02/24/2020 0947   MCV 83.9 02/15/2023 0936   MCV 85 02/24/2020 0947   MCH 26.6 02/15/2023 0936   MCHC 31.7 02/15/2023 0936   RDW 14.6 02/15/2023 0936   RDW 13.7 02/24/2020 0947   LYMPHSABS 1.9 03/19/2020 0024   MONOABS 0.4 03/19/2020 0024   EOSABS 0.2 03/19/2020 0024   BASOSABS 0.0 03/19/2020 0024     CMP     Component Value Date/Time   NA 137 02/15/2023 0936   NA 140 01/29/2023 0949   K 3.6 02/15/2023 0936   CL 106 02/15/2023 0936   CO2 21 (L) 02/15/2023 0936   GLUCOSE 119 (H) 02/15/2023 0936   BUN 6 02/15/2023 0936   BUN 8 01/29/2023 0949   CREATININE 0.83 02/15/2023 0936   CALCIUM 9.1 02/15/2023 0936   PROT 7.8 01/29/2023 0949   ALBUMIN 4.4 01/29/2023 0949   AST 26 01/29/2023 0949   ALT 26 01/29/2023 0949   ALKPHOS 95 01/29/2023 0949   BILITOT 0.5 01/29/2023 0949   GFRNONAA >60 02/15/2023 0936   GFRAA >60 05/24/2020 1800     Imaging:  CT Angio Chest PE W/Cm &/Or Wo Cm Result Date: 02/15/2023 IMPRESSION: 1. Left lingular and lower lobe pulmonary emboli. No significant embolic burden. No right heart strain. 2. Patchy mosaic pattern of  ground-glass attenuation in the lungs. This is typically seen with small airways disease such as asthma or constrictive bronchiolitis. Other possibilities would include atypical/viral pneumonia, hypersensitivity pneumonitis or cryptogenic organizing pneumonia.   DG Chest 2 View Result Date: 02/15/2023 IMPRESSION: No evidence of active cardiopulmonary disease.    Exercise Tolerance Test Result Date: 02/14/2023   There is PR depression in the inferior leads on resting EKG.   A Bruce protocol stress test was performed. Patient exercised for 8 min and 0 sec. Maximum HR of 173 bpm. MPHR 93.0 %. Peak METS 10.1 . The patient experienced no angina during the test. The patient achieved the target heart rate. Normal blood pressure and normal heart rate response noted during stress. Heart rate recovery was normal.   No ST deviation was noted. ECG was interpretable and conclusive. The ECG  was negative for ischemia.   This is a low risk study.       ECG: My personal interpretation is sinus tachycardia with QT-interval prolongation, which is new from prior ECG on 07-08-2019    ASSESSMENT & PLAN:   Assessment & Plan by Problem: Principal Problem:   Acute pulmonary embolism [facility])   Ms Vince is a 37 year old female with a past medical history of sickle cell trait, obesity, hypertension, asthma, and prediabetes who presented with chest pain, now admitted for management of pulmonary emboli and hypercoagulable workup.   ---Acute left lingular and lower lobe pulmonary emboli History notable for etonogestrel implant, which was removed in 11-2023. Patient presented with chest pain that started on 2-15 several hours after cardiac stress test for preoperative clearance. Upon arrival, CT angiogram demonstrated pulmonary emboli in left lingula and lower lobe. Echocardiogram negative for right heart strain. Electrocardiogram negative for acute ischemia and lower extremity swelling absent on exam. Heparin bolus was  administered in the ED, primary team has since transitioned to apixaban. She is currently breathing well without supplemental oxygen. Given two unprovoked pulmonary emboli and young age, hypercoagulable workup is warranted. Her PESI value of 65 indicates very low risk for 30-day mortality. > Apixaban 35m q12, then [id number] on 2-23 > Acetaminophen 65064m6 PRN > Tramadol 55m25m2 PRN > Ondansetron 4mg 4mPRN > Supplemental oxygen as needed to maintain SpO2>92 > Cardiac telemetry > Echocardiogram with bubble study > Doppler ultrasound of lower extremities > Check aPTT and PT-INR > Check lupus anticoagulant panel > Check cardiolipin antibodies   ---History of left-sided hemiparesis and ST-elevations ---Possible conversion disorder Patient states experiencing two prior strokes, but electronic medical record puts the accuracy of this report into question. In 2014, she developed left-sided hemiparesis and CT scan was concerning for stroke. However, subsequent MRI was negative for CVA and patient thought to have conversion disorder. In 2020, she presented with chest pain and electrocardiogram captured ST elevations in inferolateral leads. Catheterization negative for evidence of CAD, casting doubt on original diagnosis of STEMI. Alternatively patient may have experienced pericarditis, though diagnosis surrounding that particular event remains unclear. Prior hypercoagulability workup including PT II, FV Leiden, Beta-2 glycoprotein, and antithrombin III were all negative or within normal limits. > Echocardiogram with bubble study > Check aPTT and PT-INR > Check lupus anticoagulant panel > Check cardiolipin antibodies   ---Hypertension Patient reports longstanding history of hypertension previously treated with amlodipine. Upon arrival, her blood pressure was elevated to 132/100. Blood pressure has remained stable within normal range since admission. > Hold home amlodipine given normal  BP   ---Asthma Patient has history of asthma that is well controlled through trigger avoidance. She is prescribed albuterol and budesonide, but takes neither regularly. Last asthma exacerbation per patient was several years ago. Upon arrival, the patient was breathing comfortably without supplemental oxygen and wheezing absent on exam.  > Albuterol 2.5mg q54mRN     Diet: Normal VTE: NOAC IVF: None,None Code: Full  Prior to Admission Living Arrangement: Home, living with daughters Anticipated Discharge Location: Home Barriers to Discharge: anticoagulation medication, diagnostic workup  Dispo: Admit patient to Observation with expected length of stay less than 2 midnights.  Signed: HarperSerita Butchernternal Medicine Resident PGY-1  02/15/2023, 3:59 PM

## 2023-02-15 NOTE — Telephone Encounter (Signed)
Patient stated she has been having chest pain since her exercise stress test yesterday afternoon. Patient also stated she has been SOB. Encouraged patient to go to the ED to get evaluated. Patient agreed to plan.

## 2023-02-15 NOTE — Telephone Encounter (Signed)
Patient c/o Palpitations:  High priority if patient c/o lightheadedness, shortness of breath, or chest pain  How long have you had palpitations/irregular HR/ Afib? Are you having the symptoms now? 4:30-5pm, yes  Are you currently experiencing lightheadedness, SOB or CP? SOB, chest pain, lightheaded  Do you have a history of afib (atrial fibrillation) or irregular heart rhythm? no  Have you checked your BP or HR? (document readings if available): yesterday it was checked for her stress test  Are you experiencing any other symptoms? no   Patient states about an hour or 2 hours after her stress test yesterday she started to not feel well. She says she has been having labored breathing, chest pain, and lightheadedness. She says she also has asthma and did do an asthma treatment. She says she has not had her BP checked today, but it was taken prior to her stress test yesterday. She says she has not taken her BP medication yet. She says she has not been able to sleep because it feels like her heart will beat out of her chest.

## 2023-02-15 NOTE — Progress Notes (Signed)
Lower extremity venous bilateral study completed.   Please see CV Proc for preliminary results.   Pierre Dellarocco, RDMS, RVT  

## 2023-02-15 NOTE — Hospital Course (Signed)
Stress test > clearance for hysterectomy Developed left axillary chest discomfort a couple of hours later  Tried nebulizer treatment, didn't help  Felt nauseous this AM, came in  Tachy 123 Normal sats

## 2023-02-15 NOTE — ED Provider Notes (Signed)
[facility] Provider Note   CSN: [id number] Arrival date & time: 02/15/23  [id number]     History  Chief Complaint  Patient presents with   Chest Pain    Melissa [name] is a 37 y.o. female.  37 year old female with history of MI (states went to the cath lab, no blockage, no stents, 02/18/2019), CVA ("presumptive stoke at 37yo), HTN, pre-diabetes, asthma, undergoing cardiac clearance at request of GYN for hysterectomy, presents with CP and SHOB. Had a stress test yesterday, told everything was normal, went back home and about an hour later developed left axillary pain described as tightness around her chest and someone sitting on her chest with associated SHOB. Tried a nebulizer treatment without improvement in symptoms (does not feel like an asthma attack). Discomfort and SHOB continue throughout the night, unable to find position of comfort.  Called her cardiologist office this morning and was directed to the emergency room for evaluation.  Reports 1 episode of vomiting on arrival, tingling in her fingers and toes, generally weak.        Home Medications Prior to Admission medications   Medication Sig Start Date End Date Taking? Authorizing Provider  amLODipine (NORVASC) 5 MG tablet Take 1 tablet (5 mg total) by mouth daily. 01/29/23   Marylu Lund., NP      Allergies    Ginger    Review of Systems   Review of Systems Negative except as per HPI Physical Exam Updated Vital Signs BP 121/65   Pulse 92   Temp 97.9 F (36.6 C) (Oral)   Resp 20   Ht 5' 5"$  (1.651 m)   Wt 98.9 kg   SpO2 100%   BMI 36.28 kg/m  Physical Exam Vitals and nursing note reviewed.  Constitutional:      General: She is not in acute distress.    Appearance: She is well-developed. She is not diaphoretic.  HENT:     Head: Normocephalic and atraumatic.  Cardiovascular:     Rate and Rhythm: Regular rhythm. Tachycardia present.     Heart sounds: Normal heart  sounds. No murmur heard. Pulmonary:     Effort: Pulmonary effort is normal.     Breath sounds: Normal breath sounds.  Chest:     Chest wall: Tenderness present.       Comments: Left midaxillary TTP Abdominal:     Palpations: Abdomen is soft.     Tenderness: There is no abdominal tenderness.  Musculoskeletal:     Right lower leg: No tenderness. No edema.     Left lower leg: No tenderness. No edema.  Skin:    General: Skin is warm and dry.     Findings: No erythema or rash.  Neurological:     Mental Status: She is alert and oriented to person, place, and time.  Psychiatric:        Behavior: Behavior normal.     ED Results / Procedures / Treatments   Labs (all labs ordered are listed, but only abnormal results are displayed) Labs Reviewed  BASIC METABOLIC PANEL - Abnormal; Notable for the following components:      Result Value   CO2 21 (*)    Glucose, Bld 119 (*)    All other components within normal limits  CBC  TSH  HEPARIN LEVEL (UNFRACTIONATED)  I-STAT BETA HCG BLOOD, ED (MC, [facility], AP ONLY)  CBG MONITORING, ED  TROPONIN I (HIGH SENSITIVITY)  TROPONIN I (HIGH  SENSITIVITY)    EKG None  Radiology CT Angio Chest PE W/Cm &/Or Wo Cm  Result Date: 02/15/2023 CLINICAL DATA:  Chest pain since a stress test yesterday. EXAM: CT ANGIOGRAPHY CHEST WITH CONTRAST TECHNIQUE: Multidetector CT imaging of the chest was performed using the standard protocol during bolus administration of intravenous contrast. Multiplanar CT image reconstructions and MIPs were obtained to evaluate the vascular anatomy. RADIATION DOSE REDUCTION: This exam was performed according to the departmental dose-optimization program which includes automated exposure control, adjustment of the mA and/or kV according to patient size and/or use of iterative reconstruction technique. CONTRAST:  34m OMNIPAQUE IOHEXOL 350 MG/ML SOLN COMPARISON:  Remote chest CT from 2011 FINDINGS: Cardiovascular: The heart is within  normal limits in size. No pericardial effusion. The aorta is normal in caliber. No dissection or atherosclerotic calcifications. The pulmonary arterial tree is well opacified. Filling defects noted in the left lingular and lower lobe pulmonary arteries consistent with pulmonary embolism. No significant embolic burden. No right heart strain. No definite right-sided emboli. Mediastinum/Nodes: No mediastinal or hilar mass or lymphadenopathy. There is a small hiatal hernia noted. Lungs/Pleura: Patchy mosaic pattern of ground-glass attenuation in the lungs. This is typically seen with small airways disease such as asthma or constrictive bronchiolitis. Other possibilities would include atypical/viral pneumonia, hypersensitivity pneumonitis or cryptogenic organizing pneumonia. No pleural effusions. No pulmonary lesions or worrisome pulmonary nodules. A right middle lobe calcified granuloma is noted incidentally. Upper Abdomen: No significant upper abdominal findings. Musculoskeletal: No breast masses, supraclavicular or axillary adenopathy. The bony thorax is intact. Review of the MIP images confirms the above findings. IMPRESSION: 1. Left lingular and lower lobe pulmonary emboli. No significant embolic burden. No right heart strain. 2. Patchy mosaic pattern of ground-glass attenuation in the lungs. This is typically seen with small airways disease such as asthma or constrictive bronchiolitis. Other possibilities would include atypical/viral pneumonia, hypersensitivity pneumonitis or cryptogenic organizing pneumonia. Electronically Signed   By: PMarijo SanesM.D.   On: 02/15/2023 13:02   DG Chest 2 View  Result Date: 02/15/2023 CLINICAL DATA:  Provided history: Chest pain. EXAM: CHEST - 2 VIEW COMPARISON:  Radiographs 07/08/2019 and earlier FINDINGS: Heart size within normal limits. No appreciable airspace consolidation. No evidence of pleural effusion or pneumothorax. No acute bony abnormality identified. IMPRESSION:  No evidence of active cardiopulmonary disease. Electronically Signed   By: KKellie SimmeringD.O.   On: 02/15/2023 10:22   Exercise Tolerance Test  Result Date: 02/14/2023   There is PR depression in the inferior leads on resting EKG.   A Bruce protocol stress test was performed. Patient exercised for 8 min and 0 sec. Maximum HR of 173 bpm. MPHR 93.0 %. Peak METS 10.1 . The patient experienced no angina during the test. The patient achieved the target heart rate. Normal blood pressure and normal heart rate response noted during stress. Heart rate recovery was normal.   No ST deviation was noted. ECG was interpretable and conclusive. The ECG was negative for ischemia.   This is a low risk study.    Procedures Procedures    Medications Ordered in ED Medications  heparin ADULT infusion 100 units/mL (25000 units/2528m (1,400 Units/hr Intravenous New Bag/Given 02/15/23 1345)  morphine (PF) 2 MG/ML injection 2 mg (2 mg Intravenous Given 02/15/23 1044)  ondansetron (ZOFRAN) injection 4 mg (4 mg Intravenous Given 02/15/23 1043)  sodium chloride 0.9 % bolus 1,000 mL (0 mLs Intravenous Stopped 02/15/23 1319)  iohexol (OMNIPAQUE) 350 MG/ML injection 55 mL (55  mLs Intravenous Contrast Given 02/15/23 1252)  heparin bolus via infusion 5,000 Units (5,000 Units Intravenous Bolus from Bag 02/15/23 1348)    ED Course/ Medical Decision Making/ A&P                             Medical Decision Making Amount and/or Complexity of Data Reviewed Labs: ordered. Radiology: ordered.  Risk Prescription drug management. Decision regarding hospitalization.   This patient presents to the ED for concern of left side CP, chest tightness, SHOB, this involves an extensive number of treatment options, and is a complaint that carries with it a high risk of complications and morbidity.  The differential diagnosis includes but not limited to PE, ACS, thyroid disorder, musculoskeletal pain, zoster   Co morbidities that complicate  the patient evaluation  Prior ST elevation with cardiac cath with clean coronaries, report of prior stroke, prediabetes, hypertension, asthma   Additional history obtained:  Additional history obtained from friend at bedside who contributes to history as above External records from outside source obtained and reviewed including dc summary from 02/19/19, presented to the ER with CP, ST elevation inferior lateral leads. Cath without evidence of CAD. Had recently been admitted to the hospital for Flu A and asthma, considered pericarditis but history not consistent.  Exercise tolerance test 02/14/23 non ischemic Echo 07/27/21 with EF 55%   Lab Tests:  I Ordered, and personally interpreted labs.  The pertinent results include: CBC within normal notes.  Troponin x 2 unremarkable, negative/less than 2, TSH within normal notes.  hCG negative.  BMP without significant findings.   Imaging Studies ordered:  I ordered imaging studies including chest x-ray, CT PE study I independently visualized and interpreted imaging which showed chest x-ray without acute abnormality.  CT chest as read by radiology positive for PE, no heart strain I agree with the radiologist interpretation   Cardiac Monitoring: / EKG:  The patient was maintained on a cardiac monitor.  I personally viewed and interpreted the cardiac monitored which showed an underlying rhythm of: Sinus tachycardia, rate 120   Consultations Obtained:  I requested consultation with the ER attending, Dr. Jeanell Sparrow,  and discussed lab and imaging findings as well as pertinent plan - they recommend: consult for admission, heparin per pharmacy Discussed with IM service who will consult for admission.    Problem List / ED Course / Critical interventions / Medication management  37 year old female presents with complaint of left side chest pain, SHOB, vomiting.  Pain reproduced with palpation left axillary chest without overlying skin changes, lungs clear  to auscultation.  Patient is tachycardic.  EKG without ischemic changes, troponins negative.  CT PE study for unexplained tachycardia with left-sided chest pain and shortness of breath reveals left-sided PE without heart strain.  Patient is started on heparin.  Discussed with hospitalist for admission. I ordered medication including morphine, zofran, heparin  for pain and PE  Reevaluation of the patient after these medicines showed that the patient stayed the same I have reviewed the patients home medicines and have made adjustments as needed   Social Determinants of Health:  Lives with family   Test / Admission - Considered:  Admit for further work up         Final Clinical Impression(s) / ED Diagnoses Final diagnoses:  Acute pulmonary embolism without acute cor pulmonale, unspecified pulmonary embolism type ([place])    Rx / DC Orders ED Discharge Orders  None         Tacy Learn, PA-C 02/15/23 1448    Pattricia Boss, MD 02/16/23 1524

## 2023-02-15 NOTE — ED Notes (Signed)
ED TO INPATIENT HANDOFF REPORT  ED Nurse Name and Phone #: Su Grand [phone]  S Name/Age/Gender Melissa [name] 37 y.o. female Room/Bed: 040C/040C  Code Status   Code Status: Full Code  Home/SNF/Other Home Patient oriented to: self, place, time, and situation Is this baseline? Yes   Triage Complete: Triage complete  Chief Complaint Acute pulmonary embolism [facility]) [I26.99]  Triage Note Patient had a stress test yesterday and woke up this am complaining of chest pain and feeling like she has been hit by a truck. Patient acts as though she can't answer questions and body is flaccid.  Reports she didn't eat anything last night or this am.    Allergies Allergies  Allergen Reactions   Ginger Anaphylaxis    Level of Care/Admitting Diagnosis ED Disposition     ED Disposition  Admit   Condition  --   [place]: [facility] [100100]  Level of Care: Telemetry Medical [104]  May place patient in observation at [facility] or [facility] if equivalent level of care is available:: No  Covid Evaluation: Asymptomatic - no recent exposure (last 10 days) testing not required  Diagnosis: Acute pulmonary embolism [facility]) [id number]  Admitting Physician: Aldine Contes [phone]  Attending Physician: Aldine Contes [id number]          B Medical/Surgery History Past Medical History:  Diagnosis Date   Abnormal Pap smear 2010   Repeat pap done;Hysteroscopy;Last pap 2012;was normal   Anemia 2010   Iron supplements PP   Asthma    Has a nebulizer;triggered by stress   Chest pain    Colon polyps    Headache(784.0)    Heart attack [facility])    MD thinks may have had a heart attack;Cardiologist @ [facility]   History of CVA (cerebrovascular accident) 04/09/2013   Pt reports presumptive "stroke" when she was 37y.o.; followed by [facility] neurology for "chronic nerve pain" and has been Rx'd Hydrocodone and Mobic   Hypertension    Infection    BV;may get  frequently, UTI   Multiple food allergies    Nerve pain    Chronic;d/t possible stroke and MI   Placenta previa antepartum 2010   Placenta moved 3 days before baby was born   Pre-diabetes    Shortness of breath    Sickle cell trait ([place])    Stroke [facility]) 2013   MD thought may have had a stroke and heart attack;has neurologist @ [facility]   Vitamin D deficiency    Past Surgical History:  Procedure Laterality Date   HYSTEROSCOPY  2010   LEFT HEART CATH AND CORONARY ANGIOGRAPHY N/A 02/18/2019   Procedure: LEFT HEART CATH AND CORONARY ANGIOGRAPHY;  Surgeon: Leonie Man, MD;  Location: [facility];  Service: Cardiovascular;  Laterality: N/A;     A IV Location/Drains/Wounds Patient Lines/Drains/Airways Status     Active Line/Drains/Airways     Name Placement date Placement time Site Days   Peripheral IV 02/15/23 20 G Left Antecubital 02/15/23  1043  Antecubital  less than 1   Peripheral IV 02/15/23 20 G 1" Right Antecubital 02/15/23  1325  Antecubital  less than 1            Intake/Output Last 24 hours  Intake/Output Summary (Last 24 hours) at 02/15/2023 1456 Last data filed at 02/15/2023 1319 Gross per 24 hour  Intake 1000 ml  Output --  Net 1000 ml    Labs/Imaging Results for orders placed or performed during  the hospital encounter of 02/15/23 (from the past 48 hour(s))  POC CBG, ED     Status: None   Collection Time: 02/15/23  9:32 AM  Result Value Ref Range   Glucose-Capillary 97 70 - 99 mg/dL    Comment: Glucose reference range applies only to samples taken after fasting for at least 8 hours.  Basic metabolic panel     Status: Abnormal   Collection Time: 02/15/23  9:36 AM  Result Value Ref Range   Sodium 137 135 - 145 mmol/L   Potassium 3.6 3.5 - 5.1 mmol/L   Chloride 106 98 - 111 mmol/L   CO2 21 (L) 22 - 32 mmol/L   Glucose, Bld 119 (H) 70 - 99 mg/dL    Comment: Glucose reference range applies only to samples taken after fasting for at  least 8 hours.   BUN 6 6 - 20 mg/dL   Creatinine, Ser 0.83 0.44 - 1.00 mg/dL   Calcium 9.1 8.9 - 10.3 mg/dL   GFR, Estimated >60 >60 mL/min    Comment: (NOTE) Calculated using the CKD-EPI Creatinine Equation (2021)    Anion gap 10 5 - 15    Comment: Performed at [facility] [address] [place], [place] [address]  CBC     Status: None   Collection Time: 02/15/23  9:36 AM  Result Value Ref Range   WBC 6.3 4.0 - 10.5 K/uL   RBC 4.97 3.87 - 5.11 MIL/uL   Hemoglobin 13.2 12.0 - 15.0 g/dL   HCT 41.7 36.0 - 46.0 %   MCV 83.9 80.0 - 100.0 fL   MCH 26.6 26.0 - 34.0 pg   MCHC 31.7 30.0 - 36.0 g/dL   RDW 14.6 11.5 - 15.5 %   Platelets 288 150 - 400 K/uL   nRBC 0.0 0.0 - 0.2 %    Comment: Performed at [facility], [place] [address] [place], [place] [address]  Troponin I (High Sensitivity)     Status: None   Collection Time: 02/15/23  9:36 AM  Result Value Ref Range   Troponin I (High Sensitivity) <2 <18 ng/L    Comment: (NOTE) Elevated high sensitivity troponin I (hsTnI) values and significant  changes across serial measurements may suggest ACS but many other  chronic and acute conditions are known to elevate hsTnI results.  Refer to the "Links" section for chest pain algorithms and additional  guidance. Performed at [facility], [place] [address], [place], [place] [address]   I-Stat beta hCG blood, ED     Status: None   Collection Time: 02/15/23 10:00 AM  Result Value Ref Range   I-stat hCG, quantitative <5.0 <5 mIU/mL   Comment 3            Comment:   GEST. AGE      CONC.  (mIU/mL)   <=1 WEEK        5 - 50     2 WEEKS       50 - 500     3 WEEKS       100 - 10,000     4 WEEKS     1,000 - 30,000        FEMALE AND NON-PREGNANT FEMALE:     LESS THAN 5 mIU/mL   TSH     Status: None   Collection Time: 02/15/23 11:30 AM  Result Value Ref Range   TSH 1.195 0.350 - 4.500 uIU/mL    Comment:  Performed by a 3rd Generation assay with a functional sensitivity of  <=0.01 uIU/mL. Performed at [facility], [place] [address], [place],  [address]    CT Angio Chest PE W/Cm &/Or Wo Cm  Result Date: 02/15/2023 CLINICAL DATA:  Chest pain since a stress test yesterday. EXAM: CT ANGIOGRAPHY CHEST WITH CONTRAST TECHNIQUE: Multidetector CT imaging of the chest was performed using the standard protocol during bolus administration of intravenous contrast. Multiplanar CT image reconstructions and MIPs were obtained to evaluate the vascular anatomy. RADIATION DOSE REDUCTION: This exam was performed according to the departmental dose-optimization program which includes automated exposure control, adjustment of the mA and/or kV according to patient size and/or use of iterative reconstruction technique. CONTRAST:  80m OMNIPAQUE IOHEXOL 350 MG/ML SOLN COMPARISON:  Remote chest CT from 2011 FINDINGS: Cardiovascular: The heart is within normal limits in size. No pericardial effusion. The aorta is normal in caliber. No dissection or atherosclerotic calcifications. The pulmonary arterial tree is well opacified. Filling defects noted in the left lingular and lower lobe pulmonary arteries consistent with pulmonary embolism. No significant embolic burden. No right heart strain. No definite right-sided emboli. Mediastinum/Nodes: No mediastinal or hilar mass or lymphadenopathy. There is a small hiatal hernia noted. Lungs/Pleura: Patchy mosaic pattern of ground-glass attenuation in the lungs. This is typically seen with small airways disease such as asthma or constrictive bronchiolitis. Other possibilities would include atypical/viral pneumonia, hypersensitivity pneumonitis or cryptogenic organizing pneumonia. No pleural effusions. No pulmonary lesions or worrisome pulmonary nodules. A right middle lobe calcified granuloma is noted incidentally. Upper Abdomen: No significant upper abdominal findings. Musculoskeletal: No breast masses, supraclavicular or axillary adenopathy. The bony  thorax is intact. Review of the MIP images confirms the above findings. IMPRESSION: 1. Left lingular and lower lobe pulmonary emboli. No significant embolic burden. No right heart strain. 2. Patchy mosaic pattern of ground-glass attenuation in the lungs. This is typically seen with small airways disease such as asthma or constrictive bronchiolitis. Other possibilities would include atypical/viral pneumonia, hypersensitivity pneumonitis or cryptogenic organizing pneumonia. Electronically Signed   By: PMarijo SanesM.D.   On: 02/15/2023 13:02   DG Chest 2 View  Result Date: 02/15/2023 CLINICAL DATA:  Provided history: Chest pain. EXAM: CHEST - 2 VIEW COMPARISON:  Radiographs 07/08/2019 and earlier FINDINGS: Heart size within normal limits. No appreciable airspace consolidation. No evidence of pleural effusion or pneumothorax. No acute bony abnormality identified. IMPRESSION: No evidence of active cardiopulmonary disease. Electronically Signed   By: KKellie SimmeringD.O.   On: 02/15/2023 10:22   Exercise Tolerance Test  Result Date: 02/14/2023   There is PR depression in the inferior leads on resting EKG.   A Bruce protocol stress test was performed. Patient exercised for 8 min and 0 sec. Maximum HR of 173 bpm. MPHR 93.0 %. Peak METS 10.1 . The patient experienced no angina during the test. The patient achieved the target heart rate. Normal blood pressure and normal heart rate response noted during stress. Heart rate recovery was normal.   No ST deviation was noted. ECG was interpretable and conclusive. The ECG was negative for ischemia.   This is a low risk study.    Pending Labs Unresulted Labs (From admission, onward)     Start     Ordered   02/16/23 0500  Heparin level (unfractionated)  Daily,   R     See Hyperspace for full Linked Orders Report.   02/15/23 1319   02/16/23 0500  CBC  Daily,  R     See Hyperspace for full Linked Orders Report.   02/15/23 1319   02/16/23 [id number]  Basic metabolic panel   Tomorrow morning,   R        02/15/23 1450   02/16/23 0500  CBC  Tomorrow morning,   R        02/15/23 1450   02/16/23 0500  Protime-INR  Tomorrow morning,   R        02/15/23 1450   02/16/23 0500  APTT  Tomorrow morning,   R        02/15/23 1450   02/15/23 2000  Heparin level (unfractionated)  Once-Timed,   URGENT        02/15/23 1319   02/15/23 1450  APTT  Once,   R        02/15/23 1450   02/15/23 1450  Protime-INR  Once,   R        02/15/23 1450   02/15/23 1432  HIV Antibody (routine testing w rflx)  (HIV Antibody (Routine testing w reflex) panel)  Once,   R        02/15/23 1450            Vitals/Pain Today's Vitals   02/15/23 0929 02/15/23 1111 02/15/23 1334 02/15/23 1337  BP:    121/65  Pulse:    92  Resp:    20  Temp:   97.9 F (36.6 C)   TempSrc:   Oral   SpO2:    100%  Weight: 98.9 kg     Height: 5' 5"$  (1.651 m)     PainSc: 8  7       Isolation Precautions No active isolations  Medications Medications  heparin ADULT infusion 100 units/mL (25000 units/265m) (1,400 Units/hr Intravenous New Bag/Given 02/15/23 1345)  acetaminophen (TYLENOL) tablet 650 mg (has no administration in time range)    Or  acetaminophen (TYLENOL) suppository 650 mg (has no administration in time range)  ondansetron (ZOFRAN) tablet 4 mg (has no administration in time range)    Or  ondansetron (ZOFRAN) injection 4 mg (has no administration in time range)  albuterol (PROVENTIL) (2.5 MG/3ML) 0.083% nebulizer solution 2.5 mg (has no administration in time range)  morphine (PF) 2 MG/ML injection 2 mg (2 mg Intravenous Given 02/15/23 1044)  ondansetron (ZOFRAN) injection 4 mg (4 mg Intravenous Given 02/15/23 1043)  sodium chloride 0.9 % bolus 1,000 mL (0 mLs Intravenous Stopped 02/15/23 1319)  iohexol (OMNIPAQUE) 350 MG/ML injection 55 mL (55 mLs Intravenous Contrast Given 02/15/23 1252)  heparin bolus via infusion 5,000 Units (5,000 Units Intravenous Bolus from Bag 02/15/23 1348)     Mobility walks     Focused Assessments Cardiac Assessment Handoff:  Cardiac Rhythm: Normal sinus rhythm Lab Results  Component Value Date   CKTOTAL 90 07/08/2010   CKMB 0.6 07/08/2010   TROPONINI <0.03 02/19/2019   Lab Results  Component Value Date   DDIMER 0.38 02/11/2019   Does the Patient currently have chest pain? No   , Pulmonary Assessment Handoff:  Lung sounds:   O2 Device: Room Air      R Recommendations: See Admitting Provider Note  Report given to:   Additional Notes:

## 2023-02-16 ENCOUNTER — Other Ambulatory Visit (HOSPITAL_COMMUNITY): Payer: Medicaid Other

## 2023-02-16 ENCOUNTER — Observation Stay (HOSPITAL_BASED_OUTPATIENT_CLINIC_OR_DEPARTMENT_OTHER): Payer: Medicaid Other

## 2023-02-16 DIAGNOSIS — I2699 Other pulmonary embolism without acute cor pulmonale: Secondary | ICD-10-CM

## 2023-02-16 DIAGNOSIS — I501 Left ventricular failure: Secondary | ICD-10-CM | POA: Diagnosis not present

## 2023-02-16 LAB — BASIC METABOLIC PANEL
Anion gap: 7 (ref 5–15)
BUN: 7 mg/dL (ref 6–20)
CO2: 24 mmol/L (ref 22–32)
Calcium: 8 mg/dL — ABNORMAL LOW (ref 8.9–10.3)
Chloride: 105 mmol/L (ref 98–111)
Creatinine, Ser: 0.96 mg/dL (ref 0.44–1.00)
GFR, Estimated: >60 mL/min (ref >60)
Glucose, Bld: 123 mg/dL — ABNORMAL HIGH (ref 70–99)
Potassium: 3.3 mmol/L — ABNORMAL LOW (ref 3.5–5.1)
Sodium: 136 mmol/L (ref 135–145)

## 2023-02-16 LAB — CBC
HCT: 35.4 % — ABNORMAL LOW (ref 36.0–46.0)
Hemoglobin: 11.5 g/dL — ABNORMAL LOW (ref 12.0–15.0)
MCH: 26.9 pg (ref 26.0–34.0)
MCHC: 32.5 g/dL (ref 30.0–36.0)
MCV: 82.9 fL (ref 80.0–100.0)
Platelets: 238 10*3/uL (ref 150–400)
RBC: 4.27 MIL/uL (ref 3.87–5.11)
RDW: 14.7 % (ref 11.5–15.5)
WBC: 7.8 10*3/uL (ref 4.0–10.5)
nRBC: 0 % (ref 0.0–0.2)

## 2023-02-16 LAB — PROTIME-INR
INR: 1.4 — ABNORMAL HIGH (ref 0.8–1.2)
Prothrombin Time: 16.8 seconds — ABNORMAL HIGH (ref 11.4–15.2)

## 2023-02-16 LAB — ECHOCARDIOGRAM LIMITED BUBBLE STUDY
Est EF: 55
S' Lateral: 2.9 cm

## 2023-02-16 LAB — APTT: aPTT: 35 seconds (ref 24–36)

## 2023-02-16 MED ORDER — POTASSIUM CHLORIDE CRYS ER 20 MEQ PO TBCR
40.0000 meq | EXTENDED_RELEASE_TABLET | Freq: Once | ORAL | Status: AC
  Administered 2023-02-16: 40 meq via ORAL
  Filled 2023-02-16: qty 2

## 2023-02-16 MED ORDER — APIXABAN 5 MG PO TABS: ORAL_TABLET | ORAL | 0 refills | Status: DC

## 2023-02-16 NOTE — Progress Notes (Signed)
*  PRELIMINARY RESULTS* Echocardiogram Limited 2-D Echocardiogram  has been performed with saline bubble study.  Samuel Germany 02/16/2023, 4:48 PM

## 2023-02-16 NOTE — Plan of Care (Signed)

## 2023-02-16 NOTE — Progress Notes (Signed)
Patient is complaining of left side chest pain and feeling shaky when she ambulates. She would feel better if she stayed one more night in the hospital. Dr. Stann Mainland notified and discharge order canceled.  Massie Bougie, RN

## 2023-02-16 NOTE — Discharge Instructions (Signed)

## 2023-02-16 NOTE — Evaluation (Signed)
Physical Therapy Evaluation Patient Details Name: Melissa [name] MRN: [id number] DOB: 03-01-1986 Today's Date: 02/16/2023  History of Present Illness  Pt is 37 yo female presenting to ED for chest pain. PMH: Sickle cell trait, HTN, asthma, CVA, NSTEMI and depression.  Clinical Impression  Pt is presenting below her PLOF. Currently pt is Supervision to [place] [facility] for all functional mobility. Unable to attempt steps today due to pt becoming diaphoretic, nauseous and reporting pre-syncope during gait. Pt was assisted back to supine. Due to pt current functional status, available assistance at home, home set up and PLOF currently no recommended skilled physical therapy services on discharge from acute care hospital setting. Pt will require a RW due to weakness in order to assist with Functional activity tolerance and endurance. Pt HR/O2 sats remained WNL throughout session. BP unable to be taken in standing when pt became ill due to all hands on pt until pt was in supine.        Recommendations for follow up therapy are one component of a multi-disciplinary discharge planning process, led by the attending physician.  Recommendations may be updated based on patient status, additional functional criteria and insurance authorization.  Follow Up Recommendations No PT follow up      Assistance Recommended at Discharge Intermittent Supervision/Assistance  Patient can return home with the following  A little help with walking and/or transfers;Assist for transportation;Assistance with cooking/housework;Help with stairs or ramp for entrance    Equipment Recommendations Rolling walker (2 wheels)  Recommendations for Other Services       Functional Status Assessment Patient has had a recent decline in their functional status and demonstrates the ability to make significant improvements in function in a reasonable and predictable amount of time.     Precautions / Restrictions Precautions Precautions:  None Restrictions Weight Bearing Restrictions: No      Mobility  Bed Mobility Overal bed mobility: Needs Assistance Bed Mobility: Sit to Supine       Sit to supine: Min assist   General bed mobility comments: for bil LE due to fatigue after gait. Patient Response: Cooperative  Transfers Overall transfer level: Needs assistance Equipment used: Rolling walker (2 wheels), None Transfers: Sit to/from Stand Sit to Stand: Supervision           General transfer comment: No physical assist needed. Pt is supervision due to weakness and respiratory status.    Ambulation/Gait Ambulation/Gait assistance: Supervision, Min assist Gait Distance (Feet): 50 Feet (60 ft then 15 ft.) Assistive device: Rolling walker (2 wheels) Gait Pattern/deviations: Step-through pattern, Decreased step length - left, Decreased step length - right   Gait velocity interpretation: <1.31 ft/sec, indicative of household ambulator   General Gait Details: Partial step through pattern. Pt ambulated 50 ft then rested HR/O2 sats remained WNL. Pt then sat and rested and walked 15 ft. She became diaphoretic and was unable to continue required assistance to sit in chair. Pt reported nausea stating " I am going to pass out or throw up." Pt was assisted back to supine. BP was taken once in supine at 131/74.  Stairs Stairs:  (Deferred due to fatigue, nausea, weakness)               Balance Overall balance assessment: Mild deficits observed, not formally tested         Pertinent Vitals/Pain Pain Assessment Pain Assessment: 0-10 Pain Score: 5  Pain Descriptors / Indicators: Aching, Heaviness Pain Intervention(s): Monitored during session    Home Living Family/patient  expects to be discharged to:: Private residence Living Arrangements: Children (2, 8, 9, 14.) Available Help at Discharge: Available PRN/intermittently Type of Home: House Home Access: Stairs to enter Entrance Stairs-Rails:  None Entrance Stairs-Number of Steps: 4   Home Layout: One level Home Equipment: None      Prior Function Prior Level of Function : Driving             Mobility Comments: Pt reports no falls or use of assistive device. ADLs Comments: Independent with ADL's.     Hand Dominance   Dominant Hand: Right    Extremity/Trunk Assessment   Upper Extremity Assessment Upper Extremity Assessment: Overall WFL for tasks assessed    Lower Extremity Assessment Lower Extremity Assessment: Overall WFL for tasks assessed    Cervical / Trunk Assessment Cervical / Trunk Assessment: Normal  Communication   Communication: No difficulties  Cognition Arousal/Alertness: Awake/alert Behavior During Therapy: WFL for tasks assessed/performed Overall Cognitive Status: Within Functional Limits for tasks assessed          General Comments General comments (skin integrity, edema, etc.): Pt has generalized weakness and decreased activity tolerance due to current medical status.        Assessment/Plan    PT Assessment Patient needs continued PT services  PT Problem List Decreased mobility;Decreased activity tolerance       PT Treatment Interventions DME instruction;Functional mobility training;Balance training;Patient/family education;Gait training;Therapeutic activities;Neuromuscular re-education;Stair training;Therapeutic exercise;Manual techniques;Wheelchair mobility training    PT Goals (Current goals can be found in the Care Plan section)  Acute Rehab PT Goals Patient Stated Goal: To go home PT Goal Formulation: With patient Time For Goal Achievement: 03/02/23 Potential to Achieve Goals: Good    Frequency Min 3X/week        AM-PAC PT "6 Clicks" Mobility  Outcome Measure Help needed turning from your back to your side while in a flat bed without using bedrails?: None Help needed moving from lying on your back to sitting on the side of a flat bed without using bedrails?:  None Help needed moving to and from a bed to a chair (including a wheelchair)?: A Little Help needed standing up from a chair using your arms (e.g., wheelchair or bedside chair)?: A Little Help needed to walk in hospital room?: A Little Help needed climbing 3-5 steps with a railing? : A Little 6 Click Score: 20    End of Session Equipment Utilized During Treatment: Gait belt Activity Tolerance: Patient limited by fatigue;Other (comment) (pt became diaphoretic, reporting nausea/pre-syncopy during gait.) Patient left: in bed;with call bell/phone within reach Nurse Communication: Mobility status;Other (comment) (concerns for othostatic HTN) PT Visit Diagnosis: Unsteadiness on feet (R26.81);Other abnormalities of gait and mobility (R26.89)    Time: [id number] PT Time Calculation (min) (ACUTE ONLY): 38 min   Charges:   PT Evaluation $PT Eval Low Complexity: 1 Low PT Treatments $Gait Training: 8-22 mins $Therapeutic Activity: 8-22 mins        Tomma Rakers, DPT, CLT  Acute Rehabilitation Services Office: [phone] (Secure chat preferred)   Ander Purpura 02/16/2023, 1:25 PM

## 2023-02-16 NOTE — TOC Transition Note (Signed)
Transition of Care [facility]) - CM/SW Discharge Note   Patient Details  Name: Melissa [name] MRN: [id number] Date of Birth: [birth year]  [facility] CM/SW Contact:  Tom-Johnson, Renea Ee, RN Phone Number: 02/16/2023, 11:42 AM   Clinical Narrative:     Patient is scheduled for discharge today. No PCP noted, Per MD,  follow-up visit will be scheduled in the internal medicine clinic and patient should receive a call soon from their staff.  No TOC needs or recommendations noted. Family to transport at discharge. Non further TOC needs noted.          Final next level of care: Home/Self Care Barriers to Discharge: Barriers Resolved   Patient Goals and CMS Choice CMS Medicare.gov Compare Post Acute Care list provided to:: Patient Choice offered to / list presented to : NA  Discharge Placement                  Patient to be transferred to facility by: Family      Discharge Plan and Services Additional resources added to the After Visit Summary for                  DME Arranged: N/A DME Agency: NA       HH Arranged: NA [place] Agency: NA        Social Determinants of Health (SDOH) Interventions SDOH Screenings   Depression (PHQ2-9): Medium Risk (04/25/2020)  Tobacco Use: Low Risk  (02/15/2023)     Readmission Risk Interventions     No data to display

## 2023-02-16 NOTE — Discharge Summary (Addendum)
Name: Melissa [name] MRN: [id number] DOB: 1986-02-14 37 y.o. PCP: Pcp, No  Date of Admission: 02/15/2023  9:26 AM Date of Discharge: 02/16/2023 Attending Physician: Sid Falcon, MD  Discharge Diagnosis: Principal Problem:   Acute pulmonary embolism [facility])     Discharge Medications: Allergies as of 02/16/2023       Reactions   Ginger Anaphylaxis        Medication List     TAKE these medications    amLODipine 5 MG tablet Commonly known as: NORVASC Take 1 tablet (5 mg total) by mouth daily. What changed: when to take this   apixaban 5 MG Tabs tablet Commonly known as: ELIQUIS Take 2 tablets (10 mg total) by mouth 2 (two) times daily for 6 days, THEN 1 tablet (5 mg total) 2 (two) times daily. Start taking on: February 16, 2023   VITAMIN D-3 PO Take 1 capsule by mouth every evening.         Disposition and follow-up:    Melissa [name] was discharged from [facility] in Good condition.  At the hospital follow up visit please address:   1.  Ensure resolution of patient's symptoms including chest pain and breath shortness  2.  Verify twice-daily adherence to apixaban 38m then 570mstarting on 2-23   3. Labs / imaging needed at time of follow-up: none  4.  Pending labs / tests needing follow-up: Lupus anticoagulant panel, Cardiolipin antibodies    Follow-up Appointments:  Internal Medicine Clinic, appointment in 1-2 weeks   Hospital Course by problem list:  Melissa [name] a [age]ear old female with a past medical history of sickle cell trait, obesity, hypertension, asthma, and prediabetes who presented with chest pain, now admitted for management of pulmonary emboli and hypercoagulable workup.     ---Acute left lingular and lower lobe pulmonary emboli History notable for etonogestrel implant, which was removed in 10-2022. Patient presented with chest pain that started on 2-15 several hours after cardiac stress test for preoperative  clearance for consideration of hysterectomy. Upon arrival, CT angiogram demonstrated pulmonary emboli in left lingula and lower lobe. Echocardiogram negative for right heart strain. Electrocardiogram negative for acute ischemia and lower extremity swelling absent on exam. Heparin bolus was administered in the ED and then patient transitioned to apixaban. She was breathing well without supplemental oxygen throughout hospitalization. Given two unprovoked pulmonary emboli and young age, hypercoagulable workup was initiated and results need following in outpatient setting. Discharged with apixaban and instructions to follow up with internal medicine clinic.     ---History of left-sided hemiparesis and ST-elevations ---Possible conversion disorder Patient states experiencing two prior strokes, but the electronic medical record challenges the accuracy of this report. In 2014, she developed left-sided hemiparesis and CT scan was concerning for an acute stroke. Subsequent MRI, however, was negative for CVA and patient instead thought to have conversion disorder. In 2020, she presented with chest pain and electrocardiogram captured ST elevations in inferolateral leads. Catheterization was negative for evidence of CAD, which cast doubt on original diagnosis of STEMI. Alternatively patient may have experienced pericarditis, though definitive diagnosis surrounding that particular event remains unclear. Prior hypercoagulability workup including PT II, FV Leiden, Beta-2 glycoprotein, and antithrombin III were all negative or within normal limits. During hospitalization, the patient developed numbness-tingling sensation across her left upper extremity. Neurological exam was unremarkable and reassurance was provided.     ---Hypertension Patient reports longstanding history of hypertension previously treated with amlodipine. Upon arrival, her blood  pressure was elevated to 132/100. Blood pressure has remained stable within  normal range since admission. Amlodipine was held throughout hospitalization and resumed upon discharge.     ---Asthma Patient has history of asthma that is well controlled through trigger avoidance. She is prescribed albuterol and budesonide, but takes neither regularly. Last asthma exacerbation per patient was several years ago. Throughout hospitalization, the patient was breathing comfortably without wheezing or supplemental oxygen.    Discharge Exam:    BP 127/63 (BP Location: Left Leg)   Pulse 79   Temp 97.8 F (36.6 C) (Oral)   Resp 14   Ht 5' 5"$  (1.651 m)   Wt 98.5 kg   SpO2 100%   BMI 36.14 kg/m   Subjective: Patient reports feeling okay this morning, started experiencing numbness-tingling in her left arm. Provided reassurance after unremarkable exam. Communicated natural course of blood clots and need to continue anticoagulation, most likely indefinitely. Discussed importance of close outpatient follow-up.   General:                       awake and alert, sitting comfortably in chair, cooperative, not in acute distress Skin:                             warm and dry, intact without any obvious lesions or scars, no rashes Lungs:                          normal respiratory effort, breathing unlabored, symmetrical chest rise, no crackles or wheezing Cardiac:                        regular rate and rhythm, normal S1 and S2, no lower extremity swelling Neurologic:                   oriented to person-place-time, moving all extremities, subjective numbness-tingling worsened with light touch involving all fingers and entire left arm up to shoulder, strength 5/5 RUE and 4+/5 LUE including grip Psychiatric:                   euthymic mood with congruent affect, intelligible speech    Pertinent Labs, Studies, and Procedures:   Labs:    Latest Ref Rng & Units 02/16/2023    3:27 AM 02/15/2023    9:36 AM 11/01/2020    6:14 AM  CBC  WBC 4.0 - 10.5 K/uL 7.8  6.3  9.0    Hemoglobin 12.0 - 15.0 g/dL 11.5  13.2  9.8   Hematocrit 36.0 - 46.0 % 35.4  41.7  32.1   Platelets 150 - 400 K/uL 238  288  182        Latest Ref Rng & Units 02/16/2023    3:27 AM 02/15/2023    9:36 AM 01/29/2023    9:49 AM  BMP  Glucose 70 - 99 mg/dL 123  119  101   BUN 6 - 20 mg/dL 7  6  8   $ Creatinine 0.44 - 1.00 mg/dL 0.96  0.83  0.88   BUN/Creat Ratio 9 - 23   9   Sodium 135 - 145 mmol/L 136  137  140   Potassium 3.5 - 5.1 mmol/L 3.3  3.6  4.3   Chloride 98 - 111 mmol/L 105  106  103   CO2 22 - 32 mmol/L 24  21  21   Calcium 8.9 - 10.3 mg/dL 8.0  9.1  9.7     ______________________  Imaging:  VAS [place] LOWER EXTREMITY VENOUS (DVT) Result Date: 02/15/2023 Summary: RIGHT: - There is no evidence of deep vein thrombosis in the lower extremity.  - No cystic structure found in the popliteal fossa. LEFT: - There is no evidence of deep vein thrombosis in the lower extremity.  - No cystic structure found in the popliteal fossa.  *See table(s) above for measurements and observations.   CT Angio Chest PE W/Cm &/Or Wo Cm Result Date: 02/15/2023 IMPRESSION: 1. Left lingular and lower lobe pulmonary emboli. No significant embolic burden. No right heart strain. 2. Patchy mosaic pattern of ground-glass attenuation in the lungs. This is typically seen with small airways disease such as asthma or constrictive bronchiolitis. Other possibilities would include atypical/viral pneumonia, hypersensitivity pneumonitis or cryptogenic organizing pneumonia.  DG Chest 2 View Result Date: 02/15/2023 IMPRESSION: No evidence of active cardiopulmonary disease.   ______________________  Procedures:  none  ______________________   Discharge Instructions:  Discharge Instructions     Diet - low sodium heart healthy   Complete by: As directed    Discharge instructions   Complete by: As directed    Melissa Daire,  It was a pleasure taking care of you while you were in the hospital. Your chest pain and  breath shortness were caused by two small blood clots in your lungs. We gave you a blood thinner called apixaban, which you will continue to take at home at least for six months and probably indefinitely. This will help your body dissolve the clots that you currently have and prevent new ones from forming in the future. We have sent this medication to your preferred pharmacy.  We are scheduling you a follow-up visit with [place] in the internal medicine clinic. You should receive a call soon from our staff. If your chest pain or breath shortness return please visit the emergency department.   Increase activity slowly   Complete by: As directed      Melissa Fessenden,  It was a pleasure taking care of you while you were in the hospital. Your chest pain and breath shortness were caused by two small blood clots in your lungs. We gave you a blood thinner called apixaban, which you will continue to take at home at least for six months and probably indefinitely. This will help your body dissolve the clots that you currently have and prevent new ones from forming in the future. We have sent this medication to your preferred pharmacy.  We are scheduling you a follow-up visit with [place] in the internal medicine clinic. You should receive a call soon from our staff. If your chest pain or breath shortness return please visit the emergency department.  All the best,  Signed: Roswell Nickel, MD Internal Medicine PGY-1 Pager [phone]

## 2023-02-16 NOTE — Progress Notes (Signed)
Pt reported having slight tingling in her L hand/fingers- had this previously and was r/t her magnesium, this started about 20 mins ago, also reported hot & cold flashes overnight. Attending notified, responded will discuss during rounds, pt also aware to discuss this during rounds.

## 2023-02-17 LAB — GLUCOSE, CAPILLARY: Glucose-Capillary: 133 mg/dL — ABNORMAL HIGH (ref 70–99)

## 2023-02-17 NOTE — Progress Notes (Signed)
After working with PT patient started c/o of some numbness tingling on L side of body as well as some mild weakness to left leg. Patient has been having intermittent tingling/numbness on her left side this admit. Dr. Allyson Sabal notified and at bedside to assess patient.

## 2023-02-17 NOTE — TOC Transition Note (Signed)
Transition of Care [facility]) - CM/SW Discharge Note   Patient Details  Name: ALENCIA MAGSTADT MRN: [id number] Date of Birth: April 04, 1986  [facility]) CM/SW Contact:  Zenon Mayo, RN Phone Number: 02/17/2023, 2:38 PM   Clinical Narrative:    Patient is for dc today, she has follow up apt on AVS, she has no needs.    Final next level of care: Home/Self Care Barriers to Discharge: Barriers Resolved   Patient Goals and CMS Choice CMS Medicare.gov Compare Post Acute Care list provided to:: Patient Choice offered to / list presented to : NA  Discharge Placement                  Patient to be transferred to facility by: Family      Discharge Plan and Services Additional resources added to the After Visit Summary for                  DME Arranged: N/A DME Agency: NA       HH Arranged: NA [place] Agency: NA        Social Determinants of Health (SDOH) Interventions SDOH Screenings   Depression (PHQ2-9): Medium Risk (04/25/2020)  Tobacco Use: Low Risk  (02/15/2023)     Readmission Risk Interventions     No data to display

## 2023-02-17 NOTE — Progress Notes (Signed)
Patient is being discharged home. All discharge instructions reviewed with patient including medications. Patient verbalized a full understanding of instructions. Pt mom is her ride home.

## 2023-02-17 NOTE — Progress Notes (Signed)
Patient stayed overnight as she was feeling wobbly when ambulating yesterday. She feels much better today and was able to walk around the halls today with RN, felt much more comfortable. Denies any left sided chest pain at this time. Lungs are clear bilaterally with no adventitious sounds and normal WOB on RA. Discussed plan with mother and patient, they are in agreement. She is ready to go home today. Please refer to discharge summary from yesterday for full details. Will see her in the [facility] in 1-2 weeks for hospital follow up.

## 2023-02-17 NOTE — Progress Notes (Signed)
Physical Therapy Treatment Patient Details Name: Melissa [name] MRN: [id number] DOB: May 16, 1986 Today's Date: 02/17/2023   History of Present Illness Pt is 37 yo female presenting to ED for chest pain. PMH: Sickle cell trait, HTN, asthma, CVA, NSTEMI and depression.    PT Comments    Continuing work on functional mobility and activity tolerance;  session focused on progressive amb and stair training in prep for dc home; Overall able to walk household distances and manage 5 steps with min/minguard assist; We discussed activity tolerance, Questions solicited and answered; Encouraged pt to gently increase activity at home, and to sit down if she feels tremors coming on, or if her eyelids start to flutter shut; Pt reported L sided tingling and weakness after amb; this PT notified RN, who came to assist in evaluating pt; Dr. Allyson [name] up to see pt as well before dc; see vitals flowsheets for serial BPs   Recommendations for follow up therapy are one component of a multi-disciplinary discharge planning process, led by the attending physician.  Recommendations may be updated based on patient status, additional functional criteria and insurance authorization.  Follow Up Recommendations  No PT follow up     Assistance Recommended at Discharge Intermittent Supervision/Assistance  Patient can return home with the following A little help with walking and/or transfers;Assist for transportation;Assistance with cooking/housework;Help with stairs or ramp for entrance   Equipment Recommendations  Rolling walker (2 wheels)    Recommendations for Other Services       Precautions / Restrictions Precautions Precautions: Other (comment) Precaution Comments: Discussed self-monitoring for activity tolerance Restrictions Weight Bearing Restrictions: No     Mobility  Bed Mobility Overal bed mobility: Needs Assistance Bed Mobility: Supine to Sit     Supine to sit: Supervision     General bed mobility  comments: slow moving, but no physical assist needed    Transfers Overall transfer level: Needs assistance Equipment used: None Transfers: Sit to/from Stand Sit to Stand: Supervision           General transfer comment: No physical assist needed.Tends to reach out for UE support    Ambulation/Gait Ambulation/Gait assistance: Supervision, Min guard Gait Distance (Feet): 135 Feet [phone]) Assistive device: None Gait Pattern/deviations: Step-through pattern, Decreased step length - left, Decreased step length - right Gait velocity: slow     General Gait Details: initial walk with short step length, guarded steps; after a "warm up period" steps smoothed out to be more functional and efficient; Cues to self-monitor for activity tolerance; 2 seated rest breaks   Stairs Stairs: Yes Stairs assistance: Min assist Stair Management: No rails, Forwards Number of Stairs: 5 General stair comments: Min assist for support and balance; Cues to self-monitor for activity tolerance   Wheelchair Mobility    Modified Rankin (Stroke Patients Only)       Balance Overall balance assessment: Mild deficits observed, not formally tested                                          Cognition Arousal/Alertness: Awake/alert Behavior During Therapy: WFL for tasks assessed/performed Overall Cognitive Status: Within Functional Limits for tasks assessed                                          Exercises  General Comments General comments (skin integrity, edema, etc.): O2 sats stable trhroughout session on room air; Pt describes feeling shaky, with onset of visible tremor before she needs to sit; Noteworthy that pt's BP was quite elevated, with DBPs in the high 90s to low 110s with serial BPs taken towards end of session; see vitals flwosheets for details      Pertinent Vitals/Pain Pain Assessment Pain Assessment: Faces Faces Pain Scale: Hurts a little  bit Pain Location: chest soreness Pain Descriptors / Indicators: Sore Pain Intervention(s): Monitored during session    Home Living                          Prior Function            PT Goals (current goals can now be found in the care plan section) Acute Rehab PT Goals Patient Stated Goal: To go home PT Goal Formulation: With patient Time For Goal Achievement: 03/02/23 Potential to Achieve Goals: Good Progress towards PT goals: Progressing toward goals    Frequency    Min 3X/week      PT Plan Current plan remains appropriate    Co-evaluation              AM-PAC PT "6 Clicks" Mobility   Outcome Measure  Help needed turning from your back to your side while in a flat bed without using bedrails?: None Help needed moving from lying on your back to sitting on the side of a flat bed without using bedrails?: None Help needed moving to and from a bed to a chair (including a wheelchair)?: A Little Help needed standing up from a chair using your arms (e.g., wheelchair or bedside chair)?: A Little Help needed to walk in hospital room?: A Little Help needed climbing 3-5 steps with a railing? : A Little 6 Click Score: 20    End of Session Equipment Utilized During Treatment: Gait belt Activity Tolerance: Patient tolerated treatment well (though some similar pre-syncopal symptoms similar to yesterday's session) Patient left: in bed;with call bell/phone within reach Nurse Communication: Mobility status;Other (comment) (L weakness and L sided tingling) PT Visit Diagnosis: Unsteadiness on feet (R26.81);Other abnormalities of gait and mobility (R26.89)     Time: 1151-1250 PT Time Calculation (min) (ACUTE ONLY): 59 min  Charges:  $Gait Training: 23-37 mins $Therapeutic Activity: 8-22 mins                     Melissa [name], PT  Acute Rehabilitation Services Office [phone]    Melissa [name] 02/17/2023, 3:48 PM

## 2023-02-18 LAB — CARDIOLIPIN ANTIBODIES, IGM+IGG
Anticardiolipin IgG: <9 GPL U/mL (ref 0–14)
Anticardiolipin IgM: <9 MPL U/mL (ref 0–12)

## 2023-02-18 LAB — LUPUS ANTICOAGULANT PANEL
DRVVT: 45.4 s (ref 0.0–47.0)
PTT Lupus Anticoagulant: 38.5 s (ref 0.0–43.5)

## 2023-02-19 ENCOUNTER — Telehealth: Payer: Self-pay

## 2023-02-19 NOTE — Telephone Encounter (Signed)
Melissa [name], CMA 02/15/2023  1:29 PM EST     Spoke with the patient who voiced understanding but states she has a pulmonary embolism in her lung. She states she is at the hospital right now    Jaquelyn Bitter made aware.

## 2023-02-25 NOTE — Progress Notes (Unsigned)
   CC: hospital follow up  HPI:  [name] is a 37 y.o. with medical history of HTN, PE, and vitamin D deficiency presenting to [facility] for a hospital follow up.   Please see problem-based list for further details, assessments, and plans.  Past Medical History:  Diagnosis Date   Abnormal Pap smear 2010   Repeat pap done;Hysteroscopy;Last pap 2012;was normal   Anemia 2010   Iron supplements PP   Asthma    Has a nebulizer;triggered by stress   Chest pain    Colon polyps    Headache(784.0)    Heart attack [facility])    MD thinks may have had a heart attack;Cardiologist @ [facility]   History of CVA (cerebrovascular accident) 04/09/2013   Pt reports presumptive "stroke" when she was 37y.o.; followed by [facility] neurology for "chronic nerve pain" and has been Rx'd Hydrocodone and Mobic   Hypertension    Infection    BV;may get frequently, UTI   Multiple food allergies    Nerve pain    Chronic;d/t possible stroke and MI   Placenta previa antepartum 2010   Placenta moved 3 days before baby was born   Pre-diabetes    Shortness of breath    Sickle cell trait ([place])    Stroke ([place]) 2013   MD thought may have had a stroke and heart attack;has neurologist @ [facility]   Vitamin D deficiency      Current Outpatient Medications (Cardiovascular):    amLODipine (NORVASC) 5 MG tablet, Take 1 tablet (5 mg total) by mouth daily. (Patient taking differently: Take 5 mg by mouth every evening.)    Current Outpatient Medications (Hematological):    apixaban (ELIQUIS) 5 MG TABS tablet, Take 2 tablets (10 mg total) by mouth 2 (two) times daily for 6 days, THEN 1 tablet (5 mg total) 2 (two) times daily.  Current Outpatient Medications (Other):    Cholecalciferol (VITAMIN D-3 PO), Take 1 capsule by mouth every evening.  Review of Systems:  Review of system negative unless stated in the problem list or HPI.    Physical Exam:  There were no vitals filed for this visit.  Physical  Exam General: NAD HENT: NCAT Lungs: CTAB, no wheeze, rhonchi or rales.  Cardiovascular: Normal heart sounds, no r/m/g, 2+ pulses in all extremities. No LE edema Abdomen: No TTP, normal bowel sounds MSK: No asymmetry or muscle atrophy.  Skin: no lesions noted on exposed skin Neuro: Alert and oriented x4. CN grossly intact Psych: Normal mood and normal affect   Assessment & Plan:   No problem-specific Assessment & Plan notes found for this encounter.   See Encounters Tab for problem based charting.  Patient Discussed with Dr. {NAMES:[id number]::"Guilloud","Hoffman","Mullen","Narendra","Vincent","Machen","Lau","Hatcher","Williams"} Idamae Schuller, MD Tillie Rung. [facility], PGY-2   HTN On amlodipine 5 mg qd. Bmp with hypokalemia but normal renal fxn.

## 2023-02-26 ENCOUNTER — Other Ambulatory Visit: Payer: Self-pay

## 2023-02-26 ENCOUNTER — Ambulatory Visit: Payer: Medicaid Other | Admitting: Internal Medicine

## 2023-02-26 ENCOUNTER — Encounter: Payer: Self-pay | Admitting: Internal Medicine

## 2023-02-26 VITALS — BP 132/77 | HR 93 | Temp 98.4°F | Ht 65.0 in | Wt 220.1 lb

## 2023-02-26 DIAGNOSIS — Z1159 Encounter for screening for other viral diseases: Secondary | ICD-10-CM

## 2023-02-26 DIAGNOSIS — R87610 Atypical squamous cells of undetermined significance on cytologic smear of cervix (ASC-US): Secondary | ICD-10-CM | POA: Diagnosis not present

## 2023-02-26 DIAGNOSIS — J4520 Mild intermittent asthma, uncomplicated: Secondary | ICD-10-CM | POA: Diagnosis not present

## 2023-02-26 DIAGNOSIS — I2699 Other pulmonary embolism without acute cor pulmonale: Secondary | ICD-10-CM | POA: Diagnosis not present

## 2023-02-26 DIAGNOSIS — I10 Essential (primary) hypertension: Secondary | ICD-10-CM

## 2023-02-26 DIAGNOSIS — Z0000 Encounter for general adult medical examination without abnormal findings: Secondary | ICD-10-CM

## 2023-02-26 MED ORDER — ALBUTEROL SULFATE HFA 108 (90 BASE) MCG/ACT IN AERS: 2.0000 | INHALATION_SPRAY | Freq: Four times a day (QID) | RESPIRATORY_TRACT | 2 refills | Status: AC | PRN

## 2023-02-26 NOTE — Patient Instructions (Signed)
Ms.Mckensi A Wishard, it was a pleasure seeing you today! You endorsed feeling well today. Below are some of the things we talked about this visit. We look forward to seeing you in the follow up appointment!  Today we discussed: Continue your medications especially your apixaban. I would advise taking this medication daily for at least 3 months before any surgical procedures can be scheduled. Please reach out to your gynecologist to see the plan for hysterectomy.  You can hold off your amlodipine and refer to the handout for lifestyle modifications.   I have ordered the following labs today:   Lab Orders         BMP8+Anion Gap         Hepatitis C Ab reflex to Quant PCR       Referrals ordered today:   Referral Orders  No referral(s) requested today     I have ordered the following medication/changed the following medications:   Stop the following medications: There are no discontinued medications.   Start the following medications: Meds ordered this encounter  Medications   albuterol (VENTOLIN HFA) 108 (90 Base) MCG/ACT inhaler    Sig: Inhale 2 puffs into the lungs every 6 (six) hours as needed for wheezing or shortness of breath.    Dispense:  8 g    Refill:  2     Follow-up: 3 month follow up or earlier if needed   Please make sure to arrive 15 minutes prior to your next appointment. If you arrive late, you may be asked to reschedule.   We look forward to seeing you next time. Please call our clinic at [phone] if you have any questions or concerns. The best time to call is Monday-Friday from 9am-4pm, but there is someone available 24/7. If after hours or the weekend, call the main hospital number and ask for the Internal Medicine Resident On-Call. If you need medication refills, please notify your pharmacy one week in advance and they will send [place] a request.  Thank you for letting us take part in your care. Wishing you the best!  Thank you, Idamae Schuller, MD

## 2023-02-27 DIAGNOSIS — Z0000 Encounter for general adult medical examination without abnormal findings: Secondary | ICD-10-CM | POA: Insufficient documentation

## 2023-02-27 NOTE — Assessment & Plan Note (Addendum)
Pt with unprovoked PE and on eliquis 5 mg BID. Hypercoagulable work up has been negative. Will continue eliquis indefinitely and for minimum of 3 months without interruptions. Advised pt to take regular tylenol up to 1000 mg TID.

## 2023-02-27 NOTE — Assessment & Plan Note (Signed)
Pt has hx of AUB and hx of difficult pap smears: Sees Gynecology for AUB, was planning on hysterectomy. Still having slight bleeding and not worsened with eliquis. Hgb stable. [facility] and was getting cardiac clearance prior to PE. Advised pt to follow up with gynecology.

## 2023-02-27 NOTE — Assessment & Plan Note (Addendum)
Pt has HTN and was on amlodipine 5 mg qd. Previous Bmp with hypokalemia but normal renal fxn and repeat shows normal K. BP near goal without amlodipine. Advised pt to continue to monitor this and provided handout on lifestyle modications.

## 2023-02-27 NOTE — Assessment & Plan Note (Signed)
Hep C testing ordered.

## 2023-02-28 ENCOUNTER — Encounter: Payer: Self-pay | Admitting: Internal Medicine

## 2023-02-28 LAB — HCV INTERPRETATION

## 2023-02-28 LAB — BMP8+ANION GAP
Anion Gap: 15 mmol/L (ref 10.0–18.0)
BUN/Creatinine Ratio: 8 — ABNORMAL LOW (ref 9–23)
BUN: 7 mg/dL (ref 6–20)
CO2: 21 mmol/L (ref 20–29)
Calcium: 9.7 mg/dL (ref 8.7–10.2)
Chloride: 105 mmol/L (ref 96–106)
Creatinine, Ser: 0.86 mg/dL (ref 0.57–1.00)
Glucose: 90 mg/dL (ref 70–99)
Potassium: 4.4 mmol/L (ref 3.5–5.2)
Sodium: 141 mmol/L (ref 134–144)
eGFR: 90 mL/min/{1.73_m2} (ref >59)

## 2023-02-28 LAB — HCV AB W REFLEX TO QUANT PCR: HCV Ab: NONREACTIVE

## 2023-02-28 NOTE — Progress Notes (Signed)
Internal Medicine Clinic Attending  Case discussed with Dr. Khan  At the time of the visit.  We reviewed the resident's history and exam and pertinent patient test results.  I agree with the assessment, diagnosis, and plan of care documented in the resident's note.  

## 2023-03-10 ENCOUNTER — Emergency Department (HOSPITAL_COMMUNITY): Payer: Medicaid Other

## 2023-03-10 ENCOUNTER — Other Ambulatory Visit: Payer: Self-pay

## 2023-03-10 ENCOUNTER — Encounter (HOSPITAL_COMMUNITY): Payer: Self-pay

## 2023-03-10 ENCOUNTER — Emergency Department (HOSPITAL_COMMUNITY)
Admission: EM | Admit: 2023-03-10 | Discharge: 2023-03-10 | Disposition: A | Discharge status: Home / Self Care | Payer: Medicaid Other | Attending: Emergency Medicine | Admitting: Emergency Medicine

## 2023-03-10 DIAGNOSIS — R0602 Shortness of breath: Secondary | ICD-10-CM | POA: Diagnosis not present

## 2023-03-10 DIAGNOSIS — R079 Chest pain, unspecified: Secondary | ICD-10-CM | POA: Insufficient documentation

## 2023-03-10 DIAGNOSIS — Z1152 Encounter for screening for COVID-19: Secondary | ICD-10-CM | POA: Diagnosis not present

## 2023-03-10 DIAGNOSIS — Z7901 Long term (current) use of anticoagulants: Secondary | ICD-10-CM | POA: Diagnosis not present

## 2023-03-10 LAB — CBC WITH DIFFERENTIAL/PLATELET
Abs Immature Granulocytes: 0.02 10*3/uL (ref 0.00–0.07)
Basophils Absolute: 0 10*3/uL (ref 0.0–0.1)
Basophils Relative: 0 %
Eosinophils Absolute: 0.3 10*3/uL (ref 0.0–0.5)
Eosinophils Relative: 5 %
HCT: 37.3 % (ref 36.0–46.0)
Hemoglobin: 12 g/dL (ref 12.0–15.0)
Immature Granulocytes: 0 %
Lymphocytes Relative: 36 %
Lymphs Abs: 2 10*3/uL (ref 0.7–4.0)
MCH: 26.8 pg (ref 26.0–34.0)
MCHC: 32.2 g/dL (ref 30.0–36.0)
MCV: 83.3 fL (ref 80.0–100.0)
Monocytes Absolute: 0.4 10*3/uL (ref 0.1–1.0)
Monocytes Relative: 7 %
Neutro Abs: 2.8 10*3/uL (ref 1.7–7.7)
Neutrophils Relative %: 52 %
Platelets: 242 10*3/uL (ref 150–400)
RBC: 4.48 MIL/uL (ref 3.87–5.11)
RDW: 15.5 % (ref 11.5–15.5)
WBC: 5.5 10*3/uL (ref 4.0–10.5)
nRBC: 0 % (ref 0.0–0.2)

## 2023-03-10 LAB — COMPREHENSIVE METABOLIC PANEL
ALT: 58 U/L — ABNORMAL HIGH (ref 0–44)
AST: 45 U/L — ABNORMAL HIGH (ref 15–41)
Albumin: 3.7 g/dL (ref 3.5–5.0)
Alkaline Phosphatase: 87 U/L (ref 38–126)
Anion gap: 8 (ref 5–15)
BUN: 8 mg/dL (ref 6–20)
CO2: 25 mmol/L (ref 22–32)
Calcium: 9.2 mg/dL (ref 8.9–10.3)
Chloride: 106 mmol/L (ref 98–111)
Creatinine, Ser: 0.83 mg/dL (ref 0.44–1.00)
GFR, Estimated: >60 mL/min (ref >60)
Glucose, Bld: 100 mg/dL — ABNORMAL HIGH (ref 70–99)
Potassium: 4 mmol/L (ref 3.5–5.1)
Sodium: 139 mmol/L (ref 135–145)
Total Bilirubin: 0.8 mg/dL (ref 0.3–1.2)
Total Protein: 7.7 g/dL (ref 6.5–8.1)

## 2023-03-10 LAB — URINALYSIS, ROUTINE W REFLEX MICROSCOPIC
Bacteria, UA: NONE SEEN
Bilirubin Urine: NEGATIVE
Glucose, UA: NEGATIVE mg/dL
Ketones, ur: NEGATIVE mg/dL
Leukocytes,Ua: NEGATIVE
Nitrite: NEGATIVE
Protein, ur: NEGATIVE mg/dL
Specific Gravity, Urine: 1.004 — ABNORMAL LOW (ref 1.005–1.030)
pH: 6 (ref 5.0–8.0)

## 2023-03-10 LAB — TROPONIN I (HIGH SENSITIVITY): Troponin I (High Sensitivity): <2 ng/L (ref <18)

## 2023-03-10 LAB — RESP PANEL BY RT-PCR (RSV, FLU A&B, COVID)  RVPGX2
Influenza A by PCR: NEGATIVE
Influenza B by PCR: NEGATIVE
Resp Syncytial Virus by PCR: NEGATIVE
SARS Coronavirus 2 by RT PCR: NEGATIVE

## 2023-03-10 LAB — LACTIC ACID, PLASMA: Lactic Acid, Venous: 1.1 mmol/L (ref 0.5–1.9)

## 2023-03-10 MED ORDER — MORPHINE SULFATE (PF) 4 MG/ML IV SOLN
4.0000 mg | Freq: Once | INTRAVENOUS | Status: AC
  Administered 2023-03-10: 4 mg via INTRAVENOUS
  Filled 2023-03-10: qty 1

## 2023-03-10 MED ORDER — IOHEXOL 350 MG/ML SOLN
75.0000 mL | Freq: Once | INTRAVENOUS | Status: AC | PRN
  Administered 2023-03-10: 75 mL via INTRAVENOUS

## 2023-03-10 NOTE — Discharge Instructions (Signed)
Return for any problem.  ?

## 2023-03-10 NOTE — ED Triage Notes (Signed)
Pt came in via POV sudden, tingling, feeling achy, dizziness, nausea & feeling "winded." Pt was diagnosed 3-4 weeks ago with 2 PE's & has took her Eliquis this morning. A/Ox4, 8/10 pain felt under Lt breast.

## 2023-03-10 NOTE — ED Provider Notes (Signed)
[facility] Provider Note   CSN: [id number] Arrival date & time: 03/10/23  1128     History  No chief complaint on file.   Melissa [name] is a 37 y.o. female.  37 year old female with prior medical history as detailed below presents for evaluation.  Patient with recent diagnosis of PE.  Patient reports full compliance with previously prescribed Eliquis.  Patient reports feeling short of breath since this morning.  This is associate with mild nausea.  Patient is otherwise without complaint.  Patient is concerned about recurrent PE and/or treatment failure.  The history is provided by the patient and medical records.       Home Medications Prior to Admission medications   Medication Sig Start Date End Date Taking? Authorizing Provider  albuterol (VENTOLIN HFA) 108 (90 Base) MCG/ACT inhaler Inhale 2 puffs into the lungs every 6 (six) hours as needed for wheezing or shortness of breath. 02/26/23   Idamae Schuller, MD  amLODipine (NORVASC) 5 MG tablet Take 1 tablet (5 mg total) by mouth daily. Patient taking differently: Take 5 mg by mouth every evening. 01/29/23   Marylu Lund., NP  apixaban (ELIQUIS) 5 MG TABS tablet Take 2 tablets (10 mg total) by mouth 2 (two) times daily for 6 days, THEN 1 tablet (5 mg total) 2 (two) times daily. 02/16/23 03/24/23  Serita Butcher, MD  Cholecalciferol (VITAMIN D-3 PO) Take 1 capsule by mouth every evening.    [provider]      Allergies    Ginger    Review of Systems   Review of Systems  All other systems reviewed and are negative.   Physical Exam Updated Vital Signs BP 128/83   Pulse 72   Temp 98.4 F (36.9 C) (Oral)   Resp 17   SpO2 99%  Physical Exam Vitals and nursing note reviewed.  Constitutional:      General: She is not in acute distress.    Appearance: Normal appearance. She is well-developed.  HENT:     Head: Normocephalic and atraumatic.  Eyes:      Conjunctiva/sclera: Conjunctivae normal.     Pupils: Pupils are equal, round, and reactive to light.  Cardiovascular:     Rate and Rhythm: Normal rate and regular rhythm.     Heart sounds: Normal heart sounds.  Pulmonary:     Effort: Pulmonary effort is normal. No respiratory distress.     Breath sounds: Normal breath sounds.  Abdominal:     General: There is no distension.     Palpations: Abdomen is soft.     Tenderness: There is no abdominal tenderness.  Musculoskeletal:        General: No deformity. Normal range of motion.     Cervical back: Normal range of motion and neck supple.  Skin:    General: Skin is warm and dry.  Neurological:     General: No focal deficit present.     Mental Status: She is alert and oriented to person, place, and time.     ED Results / Procedures / Treatments   Labs (all labs ordered are listed, but only abnormal results are displayed) Labs Reviewed  COMPREHENSIVE METABOLIC PANEL - Abnormal; Notable for the following components:      Result Value   Glucose, Bld 100 (*)    AST 45 (*)    ALT 58 (*)    All other components within normal limits  URINALYSIS, ROUTINE W REFLEX  MICROSCOPIC - Abnormal; Notable for the following components:   Color, Urine STRAW (*)    Specific Gravity, Urine 1.004 (*)    Hgb urine dipstick MODERATE (*)    All other components within normal limits  RESP PANEL BY RT-PCR (RSV, FLU A&B, COVID)  [id number]  CBC WITH DIFFERENTIAL/PLATELET  LACTIC ACID, PLASMA  LACTIC ACID, PLASMA  TROPONIN I (HIGH SENSITIVITY)    EKG EKG Interpretation  Date/Time:  Sunday March 10 2023 11:49:35 EDT Ventricular Rate:  92 PR Interval:  147 QRS Duration: 76 QT Interval:  347 QTC Calculation: 425 R Axis:   28 Text Interpretation: Sinus rhythm Ventricular premature complex Aberrant conduction of SV complex(es) Probable left atrial enlargement Confirmed by Dene Gentry [phone]) on 03/10/2023 12:27:39 PM  Radiology CT Angio Chest PE W  and/or Wo Contrast  Result Date: 03/10/2023 CLINICAL DATA:  Pulmonary embolism (PE) suspected, high prob hx of pe - on eliquis - concern for tx failure EXAM: CT ANGIOGRAPHY CHEST WITH CONTRAST TECHNIQUE: Multidetector CT imaging of the chest was performed using the standard protocol during bolus administration of intravenous contrast. Multiplanar CT image reconstructions and MIPs were obtained to evaluate the vascular anatomy. RADIATION DOSE REDUCTION: This exam was performed according to the departmental dose-optimization program which includes automated exposure control, adjustment of the mA and/or kV according to patient size and/or use of iterative reconstruction technique. CONTRAST:  71m OMNIPAQUE IOHEXOL 350 MG/ML SOLN COMPARISON:  02/15/2023 FINDINGS: Cardiovascular: Satisfactory opacification of the pulmonary arteries to the segmental branch level. The previously seen lingular and left lower lobe pulmonary emboli are no longer evident. No new pulmonary arterial filling defects to suggest acute PE. Main pulmonary trunk measures 3.0 cm in diameter. Thoracic aorta is normal in course and caliber. Heart size is normal. No pericardial effusion. Mediastinum/Nodes: No enlarged mediastinal, hilar, or axillary lymph nodes. Thyroid gland, trachea, and esophagus demonstrate no significant findings. Lungs/Pleura: Slightly low lung volumes. Subtle mosaic attenuation bilaterally is less prominent compared to the previous study. No new airspace consolidation, pleural effusion, or pneumothorax. Upper Abdomen: No acute abnormality. Musculoskeletal: No chest wall abnormality. No acute or significant osseous findings. Review of the MIP images confirms the above findings. IMPRESSION: 1. No evidence of acute pulmonary embolism. The previously seen lingular and left lower lobe pulmonary emboli are no longer evident. 2. Subtle mosaic attenuation bilaterally, less prominent compared to the previous study. This is nonspecific,  but may be seen in the setting of small airways disease. No new airspace consolidation. 3. Main pulmonary trunk measures 3.0 cm in diameter, which can be seen in the setting of pulmonary arterial hypertension. Electronically Signed   By: NDavina PokeD.O.   On: 03/10/2023 13:57   DG Chest Port 1 View  Result Date: 03/10/2023 CLINICAL DATA:  Weakness EXAM: PORTABLE CHEST 1 VIEW COMPARISON:  02/15/2023 and prior studies FINDINGS: The cardiomediastinal silhouette is unremarkable. There is no evidence of focal airspace disease, pulmonary edema, suspicious pulmonary nodule/mass, pleural effusion, or pneumothorax. No acute bony abnormalities are identified. IMPRESSION: No active disease. Electronically Signed   By: JMargarette CanadaM.D.   On: 03/10/2023 12:03    Procedures Procedures    Medications Ordered in ED Medications  morphine (PF) 4 MG/ML injection 4 mg (4 mg Intravenous Given 03/10/23 1241)  iohexol (OMNIPAQUE) 350 MG/ML injection 75 mL (75 mLs Intravenous Contrast Given 03/10/23 1342)    ED Course/ Medical Decision Making/ A&P  Medical Decision Making Amount and/or Complexity of Data Reviewed Labs: ordered. Radiology: ordered.  Risk Prescription drug management.    Medical Screen Complete  This patient presented to the ED with complaint of dyspnea.  This complaint involves an extensive number of treatment options. The initial differential diagnosis includes, but is not limited to, recurrent PE, metabolic abnormality, etc.  This presentation is: Acute, Self-Limited, Previously Undiagnosed, Uncertain Prognosis, Complicated, Systemic Symptoms, and Threat to Life/Bodily Function  Patient with previously diagnosed PE.  Patient is reporting full compliance with previously prescribed Eliquis.  Patient with dyspnea since this morning.  She is concerned about possible recurrent PE.    Labs and imaging obtained are without evidence of recurrent PE or other  significant acute pathology.  Patient reassured by these findings.  Patient does understand need for close outpatient follow-up.  Strict return precautions given and understood.  Additional history obtained:  External records from outside sources obtained and reviewed including prior ED visits and prior Inpatient records.    Lab Tests:  I ordered and personally interpreted labs.  The pertinent results include: CBC, CMP, troponin, UA, COVID, flu, lactic acid   Imaging Studies ordered:  I ordered imaging studies including CT angio chest, chest x-ray I independently visualized and interpreted obtained imaging which showed NAD I agree with the radiologist interpretation.   Cardiac Monitoring:  The patient was maintained on a cardiac monitor.  I personally viewed and interpreted the cardiac monitor which showed an underlying rhythm of: NSR   Medicines ordered:  I ordered medication including morphine for pain Reevaluation of the patient after these medicines showed that the patient: improved    Problem List / ED Course:  Dyspnea   Reevaluation:  After the interventions noted above, I reevaluated the patient and found that they have: resolved   Disposition:  After consideration of the diagnostic results and the patients response to treatment, I feel that the patent would benefit from close outpatient follow-up.          Final Clinical Impression(s) / ED Diagnoses Final diagnoses:  Chest pain, unspecified type    Rx / DC Orders ED Discharge Orders     None         Valarie Merino, MD 03/10/23 1556

## 2023-03-29 ENCOUNTER — Other Ambulatory Visit: Payer: Self-pay | Admitting: Student

## 2023-04-24 ENCOUNTER — Ambulatory Visit: Payer: BC Managed Care – PPO | Admitting: Family Medicine

## 2023-04-25 ENCOUNTER — Ambulatory Visit (INDEPENDENT_AMBULATORY_CARE_PROVIDER_SITE_OTHER): Payer: Medicaid Other | Admitting: Family Medicine

## 2023-04-25 VITALS — BP 110/80 | HR 100 | Temp 98.4°F | Ht 65.0 in | Wt 219.8 lb

## 2023-04-25 DIAGNOSIS — J4520 Mild intermittent asthma, uncomplicated: Secondary | ICD-10-CM

## 2023-04-25 DIAGNOSIS — R079 Chest pain, unspecified: Secondary | ICD-10-CM

## 2023-04-25 DIAGNOSIS — E669 Obesity, unspecified: Secondary | ICD-10-CM

## 2023-04-25 DIAGNOSIS — F10920 Alcohol use, unspecified with intoxication, uncomplicated: Secondary | ICD-10-CM | POA: Insufficient documentation

## 2023-04-25 DIAGNOSIS — Z6836 Body mass index (BMI) 36.0-36.9, adult: Secondary | ICD-10-CM | POA: Diagnosis not present

## 2023-04-25 DIAGNOSIS — I10 Essential (primary) hypertension: Secondary | ICD-10-CM | POA: Diagnosis not present

## 2023-04-25 DIAGNOSIS — Z7689 Persons encountering health services in other specified circumstances: Secondary | ICD-10-CM

## 2023-04-25 MED ORDER — AMLODIPINE BESYLATE 5 MG PO TABS: 5.0000 mg | ORAL_TABLET | Freq: Every day | ORAL | 3 refills | Status: DC

## 2023-04-25 MED ORDER — BD ASSURE BPM/AUTO ARM CUFF MISC: 1.0000 | 0 refills | Status: AC

## 2023-04-25 NOTE — Progress Notes (Signed)
Hershal Coria Martin,acting as a Neurosurgeon for [employer], NP.,have documented all relevant documentation on the behalf of Remmie Bembenek, NP,as directed by  Arrow Tomko Moshe Salisbury, NP while in the presence of Kenard Morawski, NP.    Subjective:     Patient ID: Melissa [name] , female    DOB: 1986-05-09 , 37 y.o.   MRN: [id number]   Chief Complaint  Patient presents with   Establish Care    HPI  Patient presents today to establish care, states she was in the ED last month for chest pain after several diagnostic tests such as chest x-ray and chest CT, considering her previous history of pulmonary embolism in 01/2023,she was advised to follow up with a PCP outpatient . Patient came in today , wanting to know if she can stop Eliquis  5mg  tablet p.o twice daily that she is currently taking and also Amlodipine 5mg  tablet once daily(admitted she has not had it for a while)  Patient was advised to continue eliquis 5mg  until she sees the Cardiologist because this is the maintenance dose, also she was advised to make her appointment to follow up with cardiology immediately. Also advised to start taking her Amlodipine 5mg  daily again but only if SBP > than 100, since she stopped it earlier because according to her , Eliquis + Amlodipine it made her Blood pressure low. BP Readings from Last 3 Encounters: 04/25/23 : 110/80 03/10/23 : 128/83 02/26/23 : 132/77       Past Medical History:  Diagnosis Date   Abnormal Pap smear 2010   Repeat pap done;Hysteroscopy;Last pap 2012;was normal   Anemia 2010   Iron supplements PP   Asthma    Has a nebulizer;triggered by stress   Chest pain    Colon polyps    Headache(784.0)    Heart attack [facility])    MD thinks may have had a heart attack;Cardiologist @ [facility]   History of CVA (cerebrovascular accident) 04/09/2013   Pt reports presumptive "stroke" when she was 37y.o.; followed by [facility] neurology for "chronic nerve pain" and has been Rx'd Hydrocodone and Mobic   Hypertension     Infection    BV;may get frequently, UTI   Multiple food allergies    Nerve pain    Chronic;d/t possible stroke and MI   Placenta previa antepartum 2010   Placenta moved 3 days before baby was born   Pre-diabetes    Shortness of breath    Sickle cell trait (HCC)    Stroke (HCC) 2013   MD thought may have had a stroke and heart attack;has neurologist @ [facility]   Vitamin D deficiency      Family History  Problem Relation Age of Onset   Sickle cell trait Father    Hypertension Mother    Anemia Mother    Asthma Mother    Diabetes Mother    Cervical cancer Paternal Aunt    Cervical cancer Paternal Grandmother    Diabetes Paternal Grandmother    Diabetes Paternal Grandfather    Diabetes Paternal Aunt    Asthma Brother    Asthma Daughter    Asthma Son      Current Outpatient Medications:    albuterol (VENTOLIN HFA) 108 (90 Base) MCG/ACT inhaler, Inhale 2 puffs into the lungs every 6 (six) hours as needed for wheezing or shortness of breath., Disp: 8 g, Rfl: 2   amLODipine (NORVASC) 5 MG tablet, Take 1 tablet (5 mg total) by mouth daily., Disp: 30 tablet, Rfl:  3   Blood Pressure Monitoring (B-D ASSURE BPM/AUTO ARM CUFF) MISC, 1 each by Does not apply route as directed., Disp: 1 each, Rfl: 0   Cholecalciferol (VITAMIN D-3 PO), Take 1 capsule by mouth every evening., Disp: , Rfl:    ELIQUIS 5 MG TABS tablet, TAKE 2 TABLETS 2 (TWO) TIMES DAILY FOR 6 DAYS, THEN 1 TABLET (5 MG TOTAL) 2 (TWO) TIMES DAILY., Disp: 70 tablet, Rfl: 0   Allergies  Allergen Reactions   Ginger Anaphylaxis     Review of Systems  Constitutional: Negative.   HENT: Negative.    Eyes: Negative.   Respiratory: Negative.    Cardiovascular:  Positive for chest pain.       Patient states she has slight pain in her chest occasionally, that comes and goes away. Advised patient to schedule a follow up with Cardiology for evaluation.  Neurological: Negative.      Today's Vitals   04/25/23 1513   BP: 110/80  Pulse: 100  Temp: 98.4 F (36.9 C)  TempSrc: Oral  Weight: 219 lb 12.8 oz (99.7 kg)  Height: 5\' 5"  (1.651 m)  PainSc: 6   PainLoc: Chest   Body mass index is 36.58 kg/m.  Wt Readings from Last 3 Encounters:  04/25/23 219 lb 12.8 oz (99.7 kg)  02/26/23 220 lb 1.6 oz (99.8 kg)  02/16/23 217 lb 2.5 oz (98.5 kg)    Objective:  Physical Exam Constitutional:      Appearance: Normal appearance.  Cardiovascular:     Rate and Rhythm: Normal rate and regular rhythm.  Pulmonary:     Effort: Pulmonary effort is normal.     Breath sounds: Normal breath sounds.  Skin:    General: Skin is warm and dry.  Neurological:     Mental Status: She is alert.         Assessment And Plan:     1. Encounter to establish care Comments: Patient in clinic today to establish care, was in the ED 03/2023 for evaluation of chest pain and she was referred to the clinic.  2. Essential hypertension Comments: Continue  Amlodipine 5mg  by mouth daily . Take only for SBP>100. Prescription sent to pharmacy  3. Obesity, Class II, BMI 35-39.9 Comments: weight loss encouraged; of walking 3-4 days of the week encouraged  or as much as patient can tolerate  4. Chest pain, unspecified type Comments: Patient was in the ED 03/2023, for same c/o.chestpain, had labs drawn. EKG,chest x-ray & CT which were all neg.States less pain now. Follow up with cardiologist     Patient was given opportunity to ask questions. Patient verbalized understanding of the plan and was able to repeat key elements of the plan. All questions were answered to their satisfaction.  Marshella Tello Moshe Salisbury, NP   I, Jasilyn Holderman Moshe Salisbury, NP, have reviewed all documentation for this visit. The documentation on 04/30/23 for the exam, diagnosis, procedures, and orders are all accurate and complete.   IF YOU HAVE BEEN REFERRED TO A SPECIALIST, IT MAY TAKE 1-2 WEEKS TO SCHEDULE/PROCESS THE REFERRAL. IF YOU HAVE NOT HEARD FROM US/SPECIALIST IN TWO  WEEKS, PLEASE GIVE [place] A CALL AT [phone].   THE PATIENT IS ENCOURAGED TO PRACTICE SOCIAL DISTANCING DUE TO THE COVID-19 PANDEMIC.

## 2023-04-25 NOTE — Patient Instructions (Addendum)
Continue Amlodipine 5 mg p.o daily for SBP > 100 mg. Monitor BP  daily (keep a record) before next visit.   Continue Eliquis as directed by cardiologist.  Schedule follow up with cardiologist as soon as possible, patient has not returned to cardiology since she had a pulmonary embolism in 01/2023.  4. Follow up to see me in 4 weeks .

## 2023-04-30 ENCOUNTER — Encounter: Payer: Self-pay | Admitting: Family Medicine

## 2023-05-02 ENCOUNTER — Ambulatory Visit: Payer: Medicaid Other | Attending: Nurse Practitioner | Admitting: Nurse Practitioner

## 2023-05-02 ENCOUNTER — Encounter: Payer: Self-pay | Admitting: Nurse Practitioner

## 2023-05-02 VITALS — BP 124/86 | HR 82 | Ht 65.0 in | Wt 220.8 lb

## 2023-05-02 DIAGNOSIS — J4552 Severe persistent asthma with status asthmaticus: Secondary | ICD-10-CM

## 2023-05-02 DIAGNOSIS — R002 Palpitations: Secondary | ICD-10-CM

## 2023-05-02 DIAGNOSIS — Z86711 Personal history of pulmonary embolism: Secondary | ICD-10-CM

## 2023-05-02 DIAGNOSIS — R0602 Shortness of breath: Secondary | ICD-10-CM

## 2023-05-02 DIAGNOSIS — I10 Essential (primary) hypertension: Secondary | ICD-10-CM | POA: Diagnosis not present

## 2023-05-02 DIAGNOSIS — R079 Chest pain, unspecified: Secondary | ICD-10-CM

## 2023-05-02 MED ORDER — BISOPROLOL FUMARATE 5 MG PO TABS: 5.0000 mg | ORAL_TABLET | Freq: Every day | ORAL | 3 refills | Status: AC

## 2023-05-02 NOTE — Progress Notes (Signed)
Office Visit    Patient Name: Melissa [name] Date of Encounter: 05/02/2023  Primary Care Provider:  Ellender Hose, NP Primary Cardiologist:  Dietrich Pates, MD  Chief Complaint   37 year old female with history of palpitations, PE, hypertension, chest pain, asthma, and possible conversion disorder who presents for follow-up related to palpitations and chest pain.   Past Medical History    Past Medical History:  Diagnosis Date   Abnormal Pap smear 2010   Repeat pap done;Hysteroscopy;Last pap 2012;was normal   Anemia 2010   Iron supplements PP   Asthma    Has a nebulizer;triggered by stress   Chest pain    Colon polyps    Headache(784.0)    Heart attack [facility])    MD thinks may have had a heart attack;Cardiologist @ [facility]   History of CVA (cerebrovascular accident) 04/09/2013   Pt reports presumptive "stroke" when she was 37y.o.; followed by [facility] neurology for "chronic nerve pain" and has been Rx'd Hydrocodone and Mobic   Hypertension    Infection    BV;may get frequently, UTI   Multiple food allergies    Nerve pain    Chronic;d/t possible stroke and MI   Placenta previa antepartum 2010   Placenta moved 3 days before baby was born   Pre-diabetes    Shortness of breath    Sickle cell trait (HCC)    Stroke [facility]) 2013   MD thought may have had a stroke and heart attack;has neurologist @ [facility]   Vitamin D deficiency    Past Surgical History:  Procedure Laterality Date   HYSTEROSCOPY  2010   LEFT HEART CATH AND CORONARY ANGIOGRAPHY N/A 02/18/2019   Procedure: LEFT HEART CATH AND CORONARY ANGIOGRAPHY;  Surgeon: [name] Lex, MD;  Location: [facility] INVASIVE CV LAB;  Service: Cardiovascular;  Laterality: N/A;    Allergies  Allergies  Allergen Reactions   Ginger Anaphylaxis     Labs/Other Studies Reviewed    The following studies were reviewed today: ETT 02-09-23:    There is PR depression in the inferior leads on resting EKG.   A Bruce protocol stress  test was performed. Patient exercised for 8 min and 0 sec. Maximum HR of 173 bpm. MPHR 93.0 %. Peak METS 10.1 . The patient experienced no angina during the test. The patient achieved the target heart rate. Normal blood pressure and normal heart rate response noted during stress. Heart rate recovery was normal.   No ST deviation was noted. ECG was interpretable and conclusive. The ECG was negative for ischemia.   This is a low risk study.  Limited echo with bubble study 02-09-23: IMPRESSIONS     1. EF 55% distal septal hypokinesis.   2. Limted echo no doppler or color flow.   3. Negative bubble study for right to left shunt.   4. Distal septal hypokinesis . Left ventricular ejection fraction, by  estimation, is 55%. The left ventricular internal cavity size was mildly  dilated.   5. Right ventricular systolic function is normal. The right ventricular  size is normal.   6. The inferior vena cava is normal in size with greater than 50%  respiratory variability, suggesting right atrial pressure of 3 mmHg.   FINDINGS   Left Ventricle: Distal septal hypokinesis. Left ventricular ejection  fraction, by estimation, is 55%. The left ventricular internal cavity size  was mildly dilated.   Right Ventricle: The right ventricular size is normal. No increase in  right ventricular wall thickness.  Right ventricular systolic function is  normal.   Pericardium: There is no evidence of pericardial effusion.   Venous: The inferior vena cava is normal in size with greater than 50%  respiratory variability, suggesting right atrial pressure of 3 mmHg.   IAS/Shunts: Agitated saline contrast was given intravenously to evaluate  for intracardiac shunting.   Additional Comments: Limted echo no doppler or color flow. EF 55% distal  septal hypokinesis. Negative bubble study for right to left shunt.     Recent Labs: 02/15/2023: TSH 1.195 03/10/2023: ALT 58; BUN 8; Creatinine, Ser 0.83; Hemoglobin 12.0;  Platelets 242; Potassium 4.0; Sodium 139  Recent Lipid Panel    Component Value Date/Time   CHOL 137 01/29/2023 0949   TRIG 51 01/29/2023 0949   HDL 56 01/29/2023 0949   CHOLHDL 2.4 01/29/2023 0949   CHOLHDL 2.4 02/18/2019 1548   VLDL 7 02/18/2019 1548   LDLCALC 70 01/29/2023 0949    History of Present Illness    37 year old female with the above past medical history including palpitations, PE, hypertension, chest pain, asthma, and possible conversion disorder.  She was initially evaluated by cardiology in 2020 following an ED visit in the setting.  She was found to have inferior ST elevation. Emergent cardiac catheterization revealed normal coronary arteries.  Echocardiogram at the time showed EF 60 to 65%, no RWMA, she was started on amlodipine due to possible vasospasm.  Repeat echocardiogram in 2022 in the setting of palpitations, shortness of breath showed EF 55%, no RWMA.  14-day ZIO showed sinus rhythm with rare PACs and PVCs she was last seen in the office on 01/29/2023 and reported intermittent chest pain and palpitations.  She was restarted on amlodipine.  ETT was normal.  She was hospitalized in 01/2023 in the setting of PE.  CT angiogram demonstrated pulmonary emboli in the left lingula and lower lobe.  Echocardiogram was negative for right heart strain.  She was started on Eliquis.  Lower extremity Dopplers were negative for DVT.  She returned to the ED on 03/10/2023 in setting of dyspnea.  Repeat CT angio of the chest showed no evidence of acute PE, the previously seen lingular and left lower lobe PEs were no longer evident.  She presents today for follow-up. Since her last visit she has been stable overall from a cardiac standpoint though she does note ongoing intermittent chest tightness, palpitations, and tingling in her hands.  She also notes some shortness of breath in the setting of palpitations and chest tightness. She just restarted her amlodipine last week per PCP.  She has  been taking her Eliquis as prescribed. She is concerned that her symptoms are coming from her heart, she is particularly concerned that she may have "inflammation around her heart."    Home Medications    Current Outpatient Medications  Medication Sig Dispense Refill   albuterol (VENTOLIN HFA) 108 (90 Base) MCG/ACT inhaler Inhale 2 puffs into the lungs every 6 (six) hours as needed for wheezing or shortness of breath. 8 g 2   amLODipine (NORVASC) 5 MG tablet Take 1 tablet (5 mg total) by mouth daily. 30 tablet 3   Blood Pressure Monitoring (B-D ASSURE BPM/AUTO ARM CUFF) MISC 1 each by Does not apply route as directed. 1 each 0   Cholecalciferol (VITAMIN D-3 PO) Take 1 capsule by mouth every evening.     ELIQUIS 5 MG TABS tablet TAKE 2 TABLETS 2 (TWO) TIMES DAILY FOR 6 DAYS, THEN 1 TABLET (5 MG TOTAL) 2 (  TWO) TIMES DAILY. 70 tablet 0   No current facility-administered medications for this visit.     Review of Systems   She denies dyspnea, pnd, orthopnea, n, v, dizziness, syncope, edema, weight gain, or early satiety. All other systems reviewed and are otherwise negative except as noted above.   Physical Exam    VS:  BP 124/86   Pulse 82   Ht 5\' 5"  (1.651 m)   Wt 220 lb 12.8 oz (100.2 kg)   LMP 04/19/2023 Comment: Patient reports she is always on her cycle.  BMI 36.74 kg/m  GEN: Well nourished, well developed, in no acute distress. HEENT: normal. Neck: Supple, no JVD, carotid bruits, or masses. Cardiac: RRR, no murmurs, rubs, or gallops. No clubbing, cyanosis, edema.  Radials/DP/PT 2+ and equal bilaterally.  Respiratory:  Respirations regular and unlabored, clear to auscultation bilaterally. GI: Soft, nontender, nondistended, BS + x 4. MS: no deformity or atrophy. Skin: warm and dry, no rash. Neuro:  Strength and sensation are intact. Psych: Normal affect.  Accessory Clinical Findings    ECG personally reviewed by me today -NSR, 82 bpm, sinus arrhythmia- no acute changes.    Lab Results  Component Value Date   WBC 5.5 03/10/2023   HGB 12.0 03/10/2023   HCT 37.3 03/10/2023   MCV 83.3 03/10/2023   PLT 242 03/10/2023   Lab Results  Component Value Date   CREATININE 0.83 03/10/2023   BUN 8 03/10/2023   NA 139 03/10/2023   K 4.0 03/10/2023   CL 106 03/10/2023   CO2 25 03/10/2023   Lab Results  Component Value Date   ALT 58 (H) 03/10/2023   AST 45 (H) 03/10/2023   ALKPHOS 87 03/10/2023   BILITOT 0.8 03/10/2023   Lab Results  Component Value Date   CHOL 137 01/29/2023   HDL 56 01/29/2023   LDLCALC 70 01/29/2023   TRIG 51 01/29/2023   CHOLHDL 2.4 01/29/2023    Lab Results  Component Value Date   HGBA1C 5.7 (H) 01/29/2023    Assessment & Plan    1. Chest pain: Cath in 2020 revealed normal coronary arteries.  Echocardiogram at the time showed EF 60 to 65%, no RWMA, she was started on amlodipine due to possible vasospasm. ETT in 01/2023 was normal.  Limited echo in 02/19/2023 showed EF 55%, distal septal hypokinesis.  Workup to date overall reassuring.  She does note ongoing intermittent chest tightness, palpitations, shortness of breath.  She is concerned that she has "inflammation around her heart" and is interested in further testing.  Recently restarted her amlodipine.  Will repeat limited echo given recent PE, ongoing symptoms as above.   2. Palpitations: 14-day ZIO showed sinus rhythm with rare PACs and PVCs.  Continues to note intermittent palpitations with associated chest tightness and shortness of breath.  Denies any presyncope, syncope.  Will start bisoprolol 5 mg daily given history of persistent asthma.  Continue to monitor symptoms.  3. Hypertension: BP well controlled. Continue current antihypertensive regimen.   4. History of PE: Diagnosed in 01/2023.  Limited echo at that time showed no evidence of right heart strain.  Most recent CT angio of the chest showed no evidence of acute PE, the previously seen lingular and left lower lobe PEs  were no longer evident.  Continue Eliquis.  5. Asthma: She states she read previously following with pulmonology but has not seen a pulmonologist in quite some time.  Will refer to pulmonology for ongoing management of persistent asthma.  6. Disposition: Follow-up in 2 months.       Joylene Grapes, NP 05/02/2023, 8:42 AM

## 2023-05-02 NOTE — Patient Instructions (Signed)
Medication Instructions:  Start Bisoprolol 5 mg daily   *If you need a refill on your cardiac medications before your next appointment, please call your pharmacy*   Lab Work: NONE ordered at this time of appointment   If you have labs (blood work) drawn today and your tests are completely normal, you will receive your results only by: MyChart Message (if you have MyChart) OR A paper copy in the mail If you have any lab test that is abnormal or we need to change your treatment, we will call you to review the results.   Testing/Procedures: Your physician has requested that you have an echocardiogram. Echocardiography is a painless test that uses sound waves to create images of your heart. It provides your doctor with information about the size and shape of your heart and how well your heart's chambers and valves are working. This procedure takes approximately one hour. There are no restrictions for this procedure. Please do NOT wear cologne, perfume, aftershave, or lotions (deodorant is allowed). Please arrive 15 minutes prior to your appointment time.    Follow-Up: At [facility], you and your health needs are our priority.  As part of our continuing mission to provide you with exceptional heart care, we have created designated Provider Care Teams.  These Care Teams include your primary Cardiologist (physician) and Advanced Practice Providers (APPs -  Physician Assistants and Nurse Practitioners) who all work together to provide you with the care you need, when you need it.  We recommend signing up for the patient portal called "MyChart".  Sign up information is provided on this After Visit Summary.  MyChart is used to connect with patients for Virtual Visits (Telemedicine).  Patients are able to view lab/test results, encounter notes, upcoming appointments, etc.  Non-urgent messages can be sent to your provider as well.   To learn more about what you can do with MyChart, go to  [email or web address].    Your next appointment:   2 month(s)  Provider:   Chelsea Aus, PA-C, Jari Favre, PA-C, Ronie Spies, PA-C, Robin Searing, NP, Nada Boozer, NP, Jacolyn Reedy, PA-C, Eligha Bridegroom, NP, Tereso Newcomer, PA-C, or Cyndi Bender, NP         Other Instructions

## 2023-05-23 ENCOUNTER — Ambulatory Visit (INDEPENDENT_AMBULATORY_CARE_PROVIDER_SITE_OTHER): Payer: Medicaid Other | Admitting: Family Medicine

## 2023-05-23 ENCOUNTER — Encounter: Payer: Self-pay | Admitting: Family Medicine

## 2023-05-23 VITALS — BP 112/70 | HR 89 | Temp 98.5°F | Ht 65.0 in | Wt 219.0 lb

## 2023-05-23 DIAGNOSIS — R079 Chest pain, unspecified: Secondary | ICD-10-CM

## 2023-05-23 DIAGNOSIS — I10 Essential (primary) hypertension: Secondary | ICD-10-CM

## 2023-05-23 DIAGNOSIS — F321 Major depressive disorder, single episode, moderate: Secondary | ICD-10-CM | POA: Insufficient documentation

## 2023-05-23 DIAGNOSIS — J4530 Mild persistent asthma, uncomplicated: Secondary | ICD-10-CM

## 2023-05-23 DIAGNOSIS — R7309 Other abnormal glucose: Secondary | ICD-10-CM

## 2023-05-23 NOTE — Progress Notes (Signed)
I,Jameka J Llittleton,acting as a Neurosurgeon for [employer], NP.,have documented all relevant documentation on the behalf of Shawntee Mainwaring, NP,as directed by  Sherhonda Gaspar Moshe Salisbury, NP while in the presence of Amaro Mangold, NP.    Subjective:     Patient ID: Melissa [name] , female    DOB: [birth year] , 37 y.o.   MRN: [id number]   Chief Complaint  Patient presents with   Hypertension    HPI  Patient presents today in the office for Blood pressure check. She was recently at [facility] where she went for evaluation of chest pain and palpitations. She had a stress test done that was normal and her previous echocardiogram resulted with EF 55%. Patient states she has been compliant with her medications but she has not been checking her blood pressure at home because she has no BP monitoring machine. Bisprolol 5mg  once daily was added to her medication regimen by the Cardiologist.  Patient's PHQ-9 score is 14; offered depression counseling and medication management. Patient stated that she has been seeing a psychotherapist for the past 3 months and would not want any medications.  Hypertension This is a chronic problem. The current episode started more than 1 year ago. Associated symptoms include anxiety and palpitations. Pertinent negatives include no shortness of breath. There are no associated agents to hypertension. Risk factors for coronary artery disease include sedentary lifestyle. Past treatments include calcium channel blockers. There are no compliance problems.  There is no history of angina. There is no history of chronic renal disease.  Depression      The patient presents with depression.  This is a new problem.  The current episode started more than 1 month ago.   The onset quality is undetermined.   The problem occurs daily.  The problem has been waxing and waning since onset.  Associated symptoms include fatigue, helplessness, insomnia, irritable, restlessness and decreased interest.     The symptoms  are aggravated by family issues and social issues (Patient is currently going through a divorce and she has four children).  Past treatments include psychotherapy.  Compliance with treatment is good.  Risk factors include marital problems.   Past medical history includes anxiety and depression.      Past Medical History:  Diagnosis Date   Abnormal Pap smear 2010   Repeat pap done;Hysteroscopy;Last pap 2012;was normal   Anemia 2010   Iron supplements PP   Asthma    Has a nebulizer;triggered by stress   Chest pain    Colon polyps    Headache(784.0)    Heart attack [facility])    MD thinks may have had a heart attack;Cardiologist @ [facility]   History of CVA (cerebrovascular accident) 04/09/2013   Pt reports presumptive "stroke" when she was 37y.o.; followed by [facility] neurology for "chronic nerve pain" and has been Rx'd Hydrocodone and Mobic   Hypertension    Infection    BV;may get frequently, UTI   Multiple food allergies    Nerve pain    Chronic;d/t possible stroke and MI   Placenta previa antepartum 2010   Placenta moved 3 days before baby was born   Pre-diabetes    Shortness of breath    Sickle cell trait (HCC)    Stroke [facility]) 2013   MD thought may have had a stroke and heart attack;has neurologist @ [facility]   Vitamin D deficiency      Family History  Problem Relation Age of Onset   Sickle cell trait Father  Hypertension Mother    Anemia Mother    Asthma Mother    Diabetes Mother    Cervical cancer Paternal Aunt    Cervical cancer Paternal Grandmother    Diabetes Paternal Grandmother    Diabetes Paternal Grandfather    Diabetes Paternal Aunt    Asthma Brother    Asthma Daughter    Asthma Son      Current Outpatient Medications:    albuterol (VENTOLIN HFA) 108 (90 Base) MCG/ACT inhaler, Inhale 2 puffs into the lungs every 6 (six) hours as needed for wheezing or shortness of breath., Disp: 8 g, Rfl: 2   amLODipine (NORVASC) 5 MG tablet, Take 1 tablet (5 mg  total) by mouth daily., Disp: 30 tablet, Rfl: 3   bisoprolol (ZEBETA) 5 MG tablet, Take 1 tablet (5 mg total) by mouth daily., Disp: 90 tablet, Rfl: 3   Blood Pressure Monitoring (B-D ASSURE BPM/AUTO ARM CUFF) MISC, 1 each by Does not apply route as directed., Disp: 1 each, Rfl: 0   ELIQUIS 5 MG TABS tablet, TAKE 2 TABLETS 2 (TWO) TIMES DAILY FOR 6 DAYS, THEN 1 TABLET (5 MG TOTAL) 2 (TWO) TIMES DAILY., Disp: 70 tablet, Rfl: 0   Cholecalciferol (VITAMIN D-3 PO), Take 1 capsule by mouth every evening. (Patient not taking: Reported on 05/23/2023), Disp: , Rfl:    Allergies  Allergen Reactions   Ginger Anaphylaxis     Review of Systems  Constitutional:  Positive for fatigue.  HENT: Negative.    Respiratory: Negative.  Negative for shortness of breath.   Cardiovascular:  Positive for palpitations.       These occur intermittently  Musculoskeletal: Negative.   Neurological: Negative.   Psychiatric/Behavioral:  Positive for depression. The patient has insomnia.      Today's Vitals   05/23/23 0830 05/23/23 0937  BP: (!) 130/90 112/70  Pulse: 89   Temp: 98.5 F (36.9 C)   TempSrc: Oral   Weight: 219 lb (99.3 kg)   Height: 5\' 5"  (1.651 m)   PainSc: 6    PainLoc: Chest    Body mass index is 36.44 kg/m.  The ASCVD Risk score (Arnett DK, et al., 2019) failed to calculate for the following reasons:   The 2019 ASCVD risk score is only valid for ages 29 to 29   The patient has a prior MI or stroke diagnosis ++ Objective:  Physical Exam Constitutional:      General: She is irritable.     Appearance: Normal appearance.  Cardiovascular:     Rate and Rhythm: Normal rate and regular rhythm.     Pulses: Normal pulses.     Heart sounds: Normal heart sounds.  Pulmonary:     Effort: Pulmonary effort is normal.     Breath sounds: Normal breath sounds.  Abdominal:     General: Bowel sounds are normal.  Musculoskeletal:        General: Normal range of motion.  Skin:    General: Skin is  warm and dry.  Neurological:     General: No focal deficit present.     Mental Status: She is alert and oriented to person, place, and time.  Psychiatric:        Mood and Affect: Mood normal.         Assessment And Plan:     1. Essential hypertension Comments: Current Amlodipine 5mg  daily - Basic metabolic panel - CBC no Diff  2. Chest pain, unspecified type Comments: continue current treatment regimen as  stated by Cardiologist  3. Mild persistent chronic asthma without complication Comments: Refer to Pulmonology - Ambulatory referral to Pulmonology  4. Abnormal glucose - Hemoglobin A1c  5. Current moderate episode of major depressive disorder, unspecified whether recurrent (HCC) Comments: continue with Psychotherapy and re-evaluate in 6 weeks.    Return in about 4 months (around 09/23/2023) for bp check.  Patient was given opportunity to ask questions. Patient verbalized understanding of the plan and was able to repeat key elements of the plan. All questions were answered to their satisfaction.  Hezekiah Veltre Moshe Salisbury, NP   I, Waynetta Metheny Moshe Salisbury, NP, have reviewed all documentation for this visit. The documentation on 05/23/23 for the exam, diagnosis, procedures, and orders are all accurate and complete.   IF YOU HAVE BEEN REFERRED TO A SPECIALIST, IT MAY TAKE 1-2 WEEKS TO SCHEDULE/PROCESS THE REFERRAL. IF YOU HAVE NOT HEARD FROM US/SPECIALIST IN TWO WEEKS, PLEASE GIVE [place] A CALL AT [phone].   THE PATIENT IS ENCOURAGED TO PRACTICE SOCIAL DISTANCING DUE TO THE COVID-19 PANDEMIC.

## 2023-05-23 NOTE — Assessment & Plan Note (Signed)
Several abnormal glucose levels will lab draws, will check A1c levels

## 2023-05-23 NOTE — Patient Instructions (Signed)
Hypertension, Adult ?Hypertension is another name for high blood pressure. High blood pressure forces your heart to work harder to pump blood. This can cause problems over time. ?There are two numbers in a blood pressure reading. There is a top number (systolic) over a bottom number (diastolic). It is best to have a blood pressure that is below 120/80. ?What are the causes? ?The cause of this condition is not known. Some other conditions can lead to high blood pressure. ?What increases the risk? ?Some lifestyle factors can make you more likely to develop high blood pressure: ?Smoking. ?Not getting enough exercise or physical activity. ?Being overweight. ?Having too much fat, sugar, calories, or salt (sodium) in your diet. ?Drinking too much alcohol. ?Other risk factors include: ?Having any of these conditions: ?Heart disease. ?Diabetes. ?High cholesterol. ?Kidney disease. ?Obstructive sleep apnea. ?Having a family history of high blood pressure and high cholesterol. ?Age. The risk increases with age. ?Stress. ?What are the signs or symptoms? ?High blood pressure may not cause symptoms. Very high blood pressure (hypertensive crisis) may cause: ?Headache. ?Fast or uneven heartbeats (palpitations). ?Shortness of breath. ?Nosebleed. ?Vomiting or feeling like you may vomit (nauseous). ?Changes in how you see. ?Very bad chest pain. ?Feeling dizzy. ?Seizures. ?How is this treated? ?This condition is treated by making healthy lifestyle changes, such as: ?Eating healthy foods. ?Exercising more. ?Drinking less alcohol. ?Your doctor may prescribe medicine if lifestyle changes do not help enough and if: ?Your top number is above 130. ?Your bottom number is above 80. ?Your personal target blood pressure may vary. ?Follow these instructions at home: ?Eating and drinking ? ?If told, follow the DASH eating plan. To follow this plan: ?Fill one half of your plate at each meal with fruits and vegetables. ?Fill one fourth of your plate  at each meal with whole grains. Whole grains include whole-wheat pasta, brown rice, and whole-grain bread. ?Eat or drink low-fat dairy products, such as skim milk or low-fat yogurt. ?Fill one fourth of your plate at each meal with low-fat (lean) proteins. Low-fat proteins include fish, chicken without skin, eggs, beans, and tofu. ?Avoid fatty meat, cured and processed meat, or chicken with skin. ?Avoid pre-made or processed food. ?Limit the amount of salt in your diet to less than 1,500 mg each day. ?Do not drink alcohol if: ?Your doctor tells you not to drink. ?You are pregnant, may be pregnant, or are planning to become pregnant. ?If you drink alcohol: ?Limit how much you have to: ?0-1 drink a day for women. ?0-2 drinks a day for men. ?Know how much alcohol is in your drink. In the [place] one drink equals one 12 oz bottle of beer (355 mL), one 5 oz glass of wine (148 mL), or one 1? oz glass of hard liquor (44 mL). ?Lifestyle ? ?Work with your doctor to stay at a healthy weight or to lose weight. Ask your doctor what the best weight is for you. ?Get at least 30 minutes of exercise that causes your heart to beat faster (aerobic exercise) most days of the week. This may include walking, swimming, or biking. ?Get at least 30 minutes of exercise that strengthens your muscles (resistance exercise) at least 3 days a week. This may include lifting weights or doing Pilates. ?Do not smoke or use any products that contain nicotine or tobacco. If you need help quitting, ask your doctor. ?Check your blood pressure at home as told by your doctor. ?Keep all follow-up visits. ?Medicines ?Take over-the-counter and prescription medicines   only as told by your doctor. Follow directions carefully. ?Do not skip doses of blood pressure medicine. The medicine does not work as well if you skip doses. Skipping doses also puts you at risk for problems. ?Ask your doctor about side effects or reactions to medicines that you should watch  for. ?Contact a doctor if: ?You think you are having a reaction to the medicine you are taking. ?You have headaches that keep coming back. ?You feel dizzy. ?You have swelling in your ankles. ?You have trouble with your vision. ?Get help right away if: ?You get a very bad headache. ?You start to feel mixed up (confused). ?You feel weak or numb. ?You feel faint. ?You have very bad pain in your: ?Chest. ?Belly (abdomen). ?You vomit more than once. ?You have trouble breathing. ?These symptoms may be an emergency. Get help right away. Call 911. ?Do not wait to see if the symptoms will go away. ?Do not drive yourself to the hospital. ?Summary ?Hypertension is another name for high blood pressure. ?High blood pressure forces your heart to work harder to pump blood. ?For most people, a normal blood pressure is less than 120/80. ?Making healthy choices can help lower blood pressure. If your blood pressure does not get lower with healthy choices, you may need to take medicine. ?This information is not intended to replace advice given to you by your health care provider. Make sure you discuss any questions you have with your health care provider. ?Document Revised: 10/05/2021 Document Reviewed: 10/05/2021 ?Elsevier Patient Education ? 2023 [employer] ? ?

## 2023-05-23 NOTE — Assessment & Plan Note (Signed)
Patient will be referred to a pulmonologist, has not seen one since she had a Pulmonary Emboli(PE).

## 2023-05-24 LAB — CBC
Hematocrit: 38.5 % (ref 34.0–46.6)
Hemoglobin: 12.4 g/dL (ref 11.1–15.9)
MCH: 26.6 pg (ref 26.6–33.0)
MCHC: 32.2 g/dL (ref 31.5–35.7)
MCV: 83 fL (ref 79–97)
Platelets: 285 10*3/uL (ref 150–450)
RBC: 4.66 x10E6/uL (ref 3.77–5.28)
RDW: 14.4 % (ref 11.7–15.4)
WBC: 7.8 10*3/uL (ref 3.4–10.8)

## 2023-05-24 LAB — BASIC METABOLIC PANEL
BUN/Creatinine Ratio: 6 — ABNORMAL LOW (ref 9–23)
BUN: 6 mg/dL (ref 6–20)
CO2: 20 mmol/L (ref 20–29)
Calcium: 9.5 mg/dL (ref 8.7–10.2)
Chloride: 105 mmol/L (ref 96–106)
Creatinine, Ser: 1 mg/dL (ref 0.57–1.00)
Glucose: 96 mg/dL (ref 70–99)
Potassium: 4.4 mmol/L (ref 3.5–5.2)
Sodium: 140 mmol/L (ref 134–144)
eGFR: 75 mL/min/{1.73_m2} (ref >59)

## 2023-05-24 LAB — HEMOGLOBIN A1C
Est. average glucose Bld gHb Est-mCnc: 117 mg/dL
Hgb A1c MFr Bld: 5.7 % — ABNORMAL HIGH (ref 4.8–5.6)

## 2023-05-30 NOTE — Progress Notes (Signed)
No significant changes. A1c is still 5.7-Prediabetes; minimize concentrated sweets and carbohydrates

## 2023-05-31 ENCOUNTER — Ambulatory Visit (HOSPITAL_COMMUNITY): Payer: Medicaid Other | Attending: Nurse Practitioner

## 2023-05-31 DIAGNOSIS — R0602 Shortness of breath: Secondary | ICD-10-CM | POA: Insufficient documentation

## 2023-05-31 DIAGNOSIS — R002 Palpitations: Secondary | ICD-10-CM | POA: Diagnosis present

## 2023-05-31 DIAGNOSIS — R079 Chest pain, unspecified: Secondary | ICD-10-CM

## 2023-05-31 LAB — ECHOCARDIOGRAM LIMITED
Area-P 1/2: 5.23 cm2
S' Lateral: 3.3 cm

## 2023-06-03 ENCOUNTER — Telehealth: Payer: Self-pay

## 2023-06-03 NOTE — Telephone Encounter (Signed)
Spoke with pt. Pt was notified of Echo results. Pt will continue current mediation and f/u as planned.

## 2023-06-05 ENCOUNTER — Ambulatory Visit: Payer: BC Managed Care – PPO | Admitting: Nurse Practitioner

## 2023-06-28 ENCOUNTER — Encounter: Payer: Self-pay | Admitting: Internal Medicine

## 2023-06-28 ENCOUNTER — Ambulatory Visit: Payer: Medicaid Other | Admitting: Internal Medicine

## 2023-06-28 VITALS — BP 124/74 | HR 92 | Temp 97.7°F | Ht 65.0 in | Wt 216.6 lb

## 2023-06-28 DIAGNOSIS — G901 Familial dysautonomia [Riley-Day]: Secondary | ICD-10-CM | POA: Diagnosis not present

## 2023-06-28 DIAGNOSIS — I2699 Other pulmonary embolism without acute cor pulmonale: Secondary | ICD-10-CM

## 2023-06-28 DIAGNOSIS — J4520 Mild intermittent asthma, uncomplicated: Secondary | ICD-10-CM

## 2023-06-28 DIAGNOSIS — J42 Unspecified chronic bronchitis: Secondary | ICD-10-CM

## 2023-06-28 DIAGNOSIS — I2692 Saddle embolus of pulmonary artery without acute cor pulmonale: Secondary | ICD-10-CM

## 2023-06-28 DIAGNOSIS — R002 Palpitations: Secondary | ICD-10-CM

## 2023-06-28 NOTE — Patient Instructions (Addendum)
I think your symptoms of chest pain are probably residual from your pulmonary embolism in February 2024.  It is not uncommon for ongoing symptoms of chest pain to occur especially after an unexpected and traumatic event.  I would recommend NSAID medication such as ibuprofen at low doses as needed to help with the pain in addition to the Tylenol you already taking.  You can also use lidocaine patches available over-the-counter such as Salonpas to help with the pain.  I think in time it will improve.    Because your blood clot was unprovoked I am recommending lifelong anticoagulation with blood thinners because your risk for developing another blood clot is still high.  As far as your symptoms of shortness of breath and palpitations are concerned, I think it is very possible that you are developing this after your blood clot.  It is not uncommon to develop dysautonomia and palpitations after a big event like a pulmonary embolism.  The heart rate medication should help slow down this.  You should start trying to exercise a little bit as tolerated to help condition your heart.  Please start wearing her Apple Watch to try to keep track of these episodes and see if we can monitor your heart rate.  Managing dysautonomia and includes taking care of yourself.  You need to make sure that you are eating and drinking at regular intervals.  If you have these episodes try to stop sit down have something to drink and make sure you are eating something with a little sugar and salt.  There are lots of resources on the Internet on how to manage dysautonomia.  From my standpoint there are no objections to you undergoing surgery for your hysterectomy if necessary.  For your asthma please continue the albuterol nebulizer treatments as needed.  If you end up using them more than twice a week let me know and we may need to start a maintenance medication.  Please come back to see me if you have any  issues.  [email or web address]

## 2023-06-28 NOTE — Progress Notes (Signed)
Melissa [name]    [id number]    [birth year]  Primary Care Physician:Ohenhen, Dennie Bible, NP  Referring Physician: Ellender Hose, NP [address] [place],  [place] [address] Reason for Consultation: palpitations, shortness of breath Date of Consultation: 06/28/2023  Chief complaint:   Chief Complaint  Patient presents with   Consult    Follow up of PE history of asthma      HPI: Melissa [name] is a 37 y.o. woman who presents for new patient evaluation for pulmonary embolism in February 2024. Felt to be provoked.   She is up to date on pap smear. She does have dysfunctional uterine bleeding and she had an abnormal stress test in pre-op evaluation. This led to diagnosis of acute PE. She has been started on eliquis. No bleeding complications.   No history of miscarriage. No previous history of VTE.   She is having shortness of breath on the left arm pit, behind her left breast. She has sharp stabbing pain and then goes away on its on. Lasts for seconds to a couple of minutes. There with shallow breathing and deep breathing. It wakes her up from her sleep. She is having heart palpitations and has been started on bisoprolol. She is having symptoms of alternating high blood pressure and low blood pressure. All of her symptoms have started after her PE. She did have hypertension that was well controlled before the PE.   The tylenol helps a little with the pain she's having. She's had some episodes of pre-syncope but never actually passed out.   Has childhood asthma and takes albuterol inhaler as needed. All of her children have asthma. Her albuterol use is less than once a month.  Asthma Control Test ACT Total Score  06/28/2023  1:33 PM 17   She works third shift and then has her children during the day. She doesn't sleep much. She has two special needs kids. She doesn't eat and drink regularly.    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  Exposures: lives at home with 4 kids.  Smoking history: never smoker, no passive smoke exposure  Social History   Occupational History   Not on file  Tobacco Use   Smoking status: Never   Smokeless tobacco: Never  Vaping Use   Vaping Use: Never used  Substance and Sexual Activity   Alcohol use: Not Currently    Alcohol/week: 0.0 standard drinks of alcohol    Comment: last use 03/16/2020   Drug use: No   Sexual activity: Yes    Birth control/protection: None    Relevant family history:  Family History  Problem Relation Age of Onset   Sickle cell trait Father    Hypertension Mother    Anemia Mother    Asthma Mother    Diabetes Mother    Cervical cancer Paternal Aunt    Cervical cancer Paternal Grandmother    Diabetes Paternal Grandmother    Diabetes Paternal Grandfather    Diabetes Paternal Aunt    Asthma Brother    Asthma Daughter    Asthma Son     Past Medical History:  Diagnosis Date   Abnormal Pap smear 2010   Repeat pap done;Hysteroscopy;Last pap 2012;was normal   Anemia 2010   Iron supplements PP   Asthma    Has a nebulizer;triggered by stress   Chest pain    Colon polyps  Headache(784.0)    Heart attack [facility])    MD thinks may have had a heart attack;Cardiologist @ [facility]   History of CVA (cerebrovascular accident) 04/09/2013   Pt reports presumptive "stroke" when she was 37y.o.; followed by [facility] neurology for "chronic nerve pain" and has been Rx'd Hydrocodone and Mobic   Hypertension    Infection    BV;may get frequently, UTI   Multiple food allergies    Nerve pain    Chronic;d/t possible stroke and MI   Placenta previa antepartum 2010   Placenta moved 3 days before baby was born   Pre-diabetes    Shortness of breath    Sickle cell trait (HCC)    Stroke [facility]) 2013   MD thought may have had a stroke and heart attack;has neurologist @ [facility]   Vitamin D deficiency     Past Surgical History:   Procedure Laterality Date   HYSTEROSCOPY  2010   LEFT HEART CATH AND CORONARY ANGIOGRAPHY N/A 02/18/2019   Procedure: LEFT HEART CATH AND CORONARY ANGIOGRAPHY;  Surgeon: [name] Lex, MD;  Location: [facility] INVASIVE CV LAB;  Service: Cardiovascular;  Laterality: N/A;     Physical Exam: Blood pressure 124/74, pulse 92, temperature 97.7 F (36.5 C), temperature source Oral, height 5\' 5"  (1.651 m), weight 216 lb 9.6 oz (98.2 kg), SpO2 97 %. Gen:      No acute distress ENT:  no nasal polyps, mucus membranes moist Lungs:    No increased respiratory effort, symmetric chest wall excursion, clear to auscultation bilaterally, no wheezes or crackles CV:         Regular rate and rhythm; no murmurs, rubs, or gallops.  No pedal edema, no reproducible tenderness to palpitation Abd:      + bowel sounds; soft, non-tender; no distension MSK: no acute synovitis of DIP or PIP joints, no mechanics hands.  Skin:      Warm and dry; no rashes Neuro: normal speech, no focal facial asymmetry Psych: alert and oriented x3, normal mood and affect   Data Reviewed/Medical Decision Making:  Independent interpretation of tests: Imaging:  Review of patient's CTPE study March 2024  images revealed no PE. The patient's images have been independently reviewed by me.    Hypercoag work up reviewed  - negative  PFTs:   Echocardiogram May 2024 -   1. Left ventricular ejection fraction, by estimation, is 50 to 55%. The  left ventricle has low normal function. The left ventricle has no regional  wall motion abnormalities.   2. Right ventricular systolic function is normal. The right ventricular  size is normal. There is normal pulmonary artery systolic pressure.   3. The mitral valve is normal in structure. No evidence of mitral valve  regurgitation. No evidence of mitral stenosis.   4. The aortic valve is normal in structure. Aortic valve regurgitation is  not visualized. No aortic stenosis is present.   5. The  inferior vena cava is normal in size with greater than 50%  respiratory variability, suggesting right atrial pressure of 3 mmHg.   Labs:  Lab Results  Component Value Date   WBC 7.8 05/23/2023   HGB 12.4 05/23/2023   HCT 38.5 05/23/2023   MCV 83 05/23/2023   PLT 285 05/23/2023   Lab Results  Component Value Date   NA 140 05/23/2023   K 4.4 05/23/2023   CO2 20 05/23/2023   GLUCOSE 96 05/23/2023   BUN 6 05/23/2023   CREATININE 1.00 05/23/2023  CALCIUM 9.5 05/23/2023   EGFR 75 05/23/2023   GFRNONAA >60 03/10/2023     Immunization status:  Immunization History  Administered Date(s) Administered   Influenza,inj,Quad PF,6+ Mos 10/20/2013, 11/30/2014, 09/17/2019, 10/18/2020   Influenza-Unspecified 10/18/2020, 10/22/2022   PFIZER Comirnaty(Gray Top)Covid-19 Tri-Sucrose Vaccine 02/28/2021   PFIZER(Purple Top)SARS-COV-2 Vaccination 11/02/2020   Pneumococcal Polysaccharide-23 10/30/2013   Tdap 09/03/2013, 10/18/2020     I reviewed prior external note(s) from ED, cardiology, pcp  I reviewed the result(s) of the labs and imaging as noted above.   I have ordered    Assessment:  Chest pain, palpitations Unprovoked acute pulmonary embolism Feb 2024 Mild intermittent asthma   Plan/Recommendations: I think your symptoms of chest pain are probably residual from your pulmonary embolism in February 2024.  It is not uncommon for ongoing symptoms of chest pain to occur especially after an unexpected and traumatic event.  I would recommend NSAID medication such as ibuprofen at low doses as needed to help with the pain in addition to the Tylenol you already taking.  You can also use lidocaine patches available over-the-counter such as Salonpas to help with the pain.  I think in time it will improve.    Because your blood clot was unprovoked I am recommending lifelong anticoagulation with blood thinners because your risk for developing another blood clot is still high.  As far as your  symptoms of shortness of breath and palpitations are concerned, I think it is very possible that you are developing this after your blood clot.  It is not uncommon to develop dysautonomia and palpitations after a big event like a pulmonary embolism.  The heart rate medication should help slow down this.  You should start trying to exercise a little bit as tolerated to help condition your heart.  Please start wearing her Apple Watch to try to keep track of these episodes and see if we can monitor your heart rate.  Managing dysautonomia and includes taking care of yourself.  You need to make sure that you are eating and drinking at regular intervals.  If you have these episodes try to stop sit down have something to drink and make sure you are eating something with a little sugar and salt.  There are lots of resources on the Internet on how to manage dysautonomia.  From my standpoint there are no objections to you undergoing surgery for your hysterectomy if necessary.  For your asthma please continue the albuterol nebulizer treatments as needed.  If you end up using them more than twice a week let me know and we may need to start a maintenance medication.  Please come back to see me if you have any issues.  [email or web address]  I spent 45 minutes in the care of this patient today including pre-charting, chart review, review of results, face-to-face care, coordination of care and communication with consultants etc.).     Return to Care: Return if symptoms worsen or fail to improve.  Durel Salts, MD Pulmonary and Critical Care Medicine [facility] Office:[phone]  CC: Ellender Hose, NP

## 2023-07-05 ENCOUNTER — Other Ambulatory Visit: Payer: Self-pay | Admitting: Internal Medicine

## 2023-07-09 NOTE — Progress Notes (Deleted)
  Cardiology Office Note:  .   Date:  07/09/2023  ID:  Melissa [name], DOB 11-Jun-1986, MRN [id number] PCP: Melissa Hose, NP  [facility] Providers Cardiologist:  Melissa Pates, MD { Click to update primary MD,subspecialty MD or APP then REFRESH:1}   Patient Profile: .      PMH: Palpitations Hypertension Pulmonary embolus CTA 01/2023 with emboli in left lingula and lower lobe On Eliquis for [facility] CVA Presumed stroke in 2014 at age 39 y/o followed by [facility] for chronic nerve pain Sickle cell trait  Initially evaluated by cardiology in 2020 following an ED visit.  Found to have inferior ST elevation with emergent cardiac cath that revealed normal coronary arteries.  Echo at that time showed EF 60 to 65%, no RWMA.  She was started on amlodipine due to possible vasospasm. Repeat echo in 2022 in setting of palpitations and SOB showed EF 55%, no rwma. 14-day Zio monitor showed sinus rhythm with rare PACs and PVCs.  Normal ETT 02/13/2023.  Hospitalized February 2024 in the setting of PE.  CT angiogram demonstrated pulmonary emboli in the left lingula and lower lobe.  Echo was negative for right heart strain.  She was started on Eliquis.  Lower extremity Dopplers were negative for DVT.  Last cardiology clinic visit was 05/02/2023 with Melissa Person, NP at which time she reported chest tightness, palpitations, and tingling in her hands.  She also notes some shortness of breath in the setting of the symptoms.  Has been taking Eliquis as prescribed.  Is concerned about "inflammation around her heart."  Limited echocardiogram 05/31/2023 revealed normal EF 50-55%, no evidence of pericardial effusion, no rwma, no significant valvular abnormalities.  She was referred to pulmonology for evaluation of asthma and advised to return in 2 months.       History of Present Illness: Melissa Kitchen   Clarie A [name] is a *** 37 y.o. female here today for 2 month follow-up of chest tightness, palpitations, and shortness of breath.   ROS:  ***       Studies Reviewed: .        *** Risk Assessment/Calculations:   {Does this patient have ATRIAL FIBRILLATION?:[phone]} No BP recorded.  {Refresh Note OR Click here to enter BP  :1}***       Physical Exam:   VS:  There were no vitals taken for this visit.   Wt Readings from Last 3 Encounters:  06/28/23 216 lb 9.6 oz (98.2 kg)  05/23/23 219 lb (99.3 kg)  05/02/23 220 lb 12.8 oz (100.2 kg)    GEN: Well nourished, well developed in no acute distress NECK: No JVD; No carotid bruits CARDIAC: ***RRR, no murmurs, rubs, gallops RESPIRATORY:  Clear to auscultation without rales, wheezing or rhonchi  ABDOMEN: Soft, non-tender, non-distended EXTREMITIES:  No edema; No deformity     ASSESSMENT AND PLAN: .    History of PE: CTA with CTPE study March 2024 revealed no PE. Hypercoagulable workup was negative. Recommendation from pulmonology for lifelong anticoagulation. Symptoms of chest pain thought to be residual from PE.  Hypertension: Palpitations:    {Are you ordering a CV Procedure (e.g. stress test, cath, DCCV, TEE, etc)?   Press F2        :[id number]}  Dispo: ***  Signed, Melissa Bridegroom, NP-C

## 2023-07-11 ENCOUNTER — Encounter: Payer: Self-pay | Admitting: Nurse Practitioner

## 2023-07-11 ENCOUNTER — Ambulatory Visit: Payer: Medicaid Other | Attending: Nurse Practitioner | Admitting: Nurse Practitioner

## 2023-07-22 ENCOUNTER — Other Ambulatory Visit: Payer: Self-pay | Admitting: Family Medicine

## 2023-07-24 ENCOUNTER — Other Ambulatory Visit: Payer: Self-pay

## 2023-07-24 MED ORDER — APIXABAN 5 MG PO TABS: 5.0000 mg | ORAL_TABLET | Freq: Two times a day (BID) | ORAL | 0 refills | Status: DC

## 2023-09-25 ENCOUNTER — Ambulatory Visit: Payer: Medicaid Other | Admitting: Family Medicine

## 2023-09-25 VITALS — BP 122/80 | HR 91 | Temp 98.1°F | Ht 65.0 in | Wt 221.0 lb

## 2023-09-25 DIAGNOSIS — Z6836 Body mass index (BMI) 36.0-36.9, adult: Secondary | ICD-10-CM

## 2023-09-25 DIAGNOSIS — D6859 Other primary thrombophilia: Secondary | ICD-10-CM | POA: Insufficient documentation

## 2023-09-25 DIAGNOSIS — E6609 Other obesity due to excess calories: Secondary | ICD-10-CM

## 2023-09-25 DIAGNOSIS — I10 Essential (primary) hypertension: Secondary | ICD-10-CM | POA: Diagnosis not present

## 2023-09-25 DIAGNOSIS — F331 Major depressive disorder, recurrent, moderate: Secondary | ICD-10-CM

## 2023-09-25 DIAGNOSIS — Z2821 Immunization not carried out because of patient refusal: Secondary | ICD-10-CM

## 2023-09-25 MED ORDER — AMLODIPINE BESYLATE 5 MG PO TABS: 5.0000 mg | ORAL_TABLET | Freq: Every day | ORAL | 2 refills | Status: DC

## 2023-09-25 MED ORDER — APIXABAN 5 MG PO TABS: 5.0000 mg | ORAL_TABLET | Freq: Two times a day (BID) | ORAL | 2 refills | Status: AC

## 2023-09-25 NOTE — Assessment & Plan Note (Signed)
Continue Amlodipine 5mg  QD

## 2023-09-25 NOTE — Assessment & Plan Note (Addendum)
On eliquis BID d/t Pulmonary Embolism.

## 2023-09-25 NOTE — Assessment & Plan Note (Addendum)
Has been seeing  a therapist([facility])

## 2023-09-25 NOTE — Assessment & Plan Note (Signed)
She is encouraged to strive for BMI less than 30 to decrease cardiac risk. Advised to aim for at least 150 minutes of exercise per week.  

## 2023-09-25 NOTE — Progress Notes (Addendum)
 Madelaine Bhat, CMA,acting as a Neurosurgeon for Ellender Hose, NP.,have documented all relevant documentation on the behalf of Ellender Hose, NP,as directed by  Ellender Hose, NP while in the presence of Ellender Hose, NP.  Subjective:  Patient ID: Melissa [name] , female    DOB: [birth year] , 37 y.o.   MRN: [id number]  Chief Complaint  Patient presents with   Hypertension    HPI  Patient presents today for a  follow up on her Blood pressure. She went to see the Pulmonologist in June,2024, who diagnosed her with dysautonomia.Patient continues to take Eliquis for history of Pes.Patient reports compliance with medication. Patient denies any chest pain, SOB, or headaches. Patient states she has been seeing her therapist for depression and she is satisfied, does not want medication for depression.     Past Medical History:  Diagnosis Date   Abnormal Pap smear 2010   Repeat pap done;Hysteroscopy;Last pap 2012;was normal   Anemia 2010   Iron supplements PP   Asthma    Has a nebulizer;triggered by stress   Chest pain    Colon polyps    Headache(784.0)    Heart attack [facility])    MD thinks may have had a heart attack;Cardiologist @ [facility]   History of CVA (cerebrovascular accident) 04/09/2013   Pt reports presumptive "stroke" when she was 37y.o.; followed by [facility] neurology for "chronic nerve pain" and has been Rx'd Hydrocodone and Mobic   Hypertension    Infection    BV;may get frequently, UTI   Multiple food allergies    Nerve pain    Chronic;d/t possible stroke and MI   Placenta previa antepartum 2010   Placenta moved 3 days before baby was born   Pre-diabetes    Shortness of breath    Sickle cell trait (HCC)    Stroke (HCC) 2013   MD thought may have had a stroke and heart attack;has neurologist @ [facility]   Vitamin D deficiency      Family History  Problem Relation Age of Onset   Sickle cell trait Father    Hypertension Mother    Anemia Mother    Asthma Mother    Diabetes  Mother    Cervical cancer Paternal Aunt    Cervical cancer Paternal Grandmother    Diabetes Paternal Grandmother    Diabetes Paternal Grandfather    Diabetes Paternal Aunt    Asthma Brother    Asthma Daughter    Asthma Son      Current Outpatient Medications:    albuterol (VENTOLIN HFA) 108 (90 Base) MCG/ACT inhaler, Inhale 2 puffs into the lungs every 6 (six) hours as needed for wheezing or shortness of breath., Disp: 8 g, Rfl: 2   bisoprolol (ZEBETA) 5 MG tablet, Take 1 tablet (5 mg total) by mouth daily., Disp: 90 tablet, Rfl: 3   Blood Pressure Monitoring (B-D ASSURE BPM/AUTO ARM CUFF) MISC, 1 each by Does not apply route as directed., Disp: 1 each, Rfl: 0   Cholecalciferol (VITAMIN D-3 PO), Take 1 capsule by mouth every evening., Disp: , Rfl:    amLODipine (NORVASC) 5 MG tablet, Take 1 tablet (5 mg total) by mouth daily., Disp: 90 tablet, Rfl: 2   apixaban (ELIQUIS) 5 MG TABS tablet, Take 1 tablet (5 mg total) by mouth 2 (two) times daily., Disp: 180 tablet, Rfl: 2   Allergies  Allergen Reactions   Ginger Anaphylaxis     Review of Systems  Constitutional: Negative.   HENT: Negative.  Eyes: Negative.   Respiratory: Negative.    Cardiovascular: Negative.   Gastrointestinal: Negative.   Psychiatric/Behavioral: Negative.       Today's Vitals   09/25/23 0832  BP: 122/80  Pulse: 91  Temp: 98.1 F (36.7 C)  SpO2: 96%  Weight: 221 lb (100.2 kg)  Height: 5\' 5"  (1.651 m)  PainSc: 0-No pain   Body mass index is 36.78 kg/m.  Wt Readings from Last 3 Encounters:  09/25/23 221 lb (100.2 kg)  06/28/23 216 lb 9.6 oz (98.2 kg)  05/23/23 219 lb (99.3 kg)    The ASCVD Risk score (Arnett DK, et al., 2019) failed to calculate for the following reasons:   The 2019 ASCVD risk score is only valid for ages 79 to 46   The patient has a prior MI or stroke diagnosis  Objective:  Physical Exam Constitutional:      Appearance: Normal appearance.  Cardiovascular:     Rate and  Rhythm: Normal rate and regular rhythm.     Pulses: Normal pulses.     Heart sounds: Normal heart sounds.  Pulmonary:     Effort: Pulmonary effort is normal.     Breath sounds: Normal breath sounds.  Abdominal:     General: Bowel sounds are normal.  Skin:    General: Skin is warm and dry.  Neurological:     Mental Status: She is alert.         Assessment And Plan:  Essential hypertension Assessment & Plan: Continue Amlodipine 5mg  QD  Orders: -     amLODIPine Besylate; Take 1 tablet (5 mg total) by mouth daily.  Dispense: 90 tablet; Refill: 2  Thrombophilia (HCC) Assessment & Plan: On eliquis BID d/t Pulmonary Embolism.  Orders: -     Apixaban; Take 1 tablet (5 mg total) by mouth 2 (two) times daily.  Dispense: 180 tablet; Refill: 2  Moderate episode of recurrent major depressive disorder (HCC) Assessment & Plan: Has been seeing  a therapist([facility])   Class 2 obesity due to excess calories with body mass index (BMI) of 36.0 to 36.9 in adult, unspecified whether serious comorbidity present Assessment & Plan: She is encouraged to strive for BMI less than 30 to decrease cardiac risk. Advised to aim for at least 150 minutes of exercise per week.    COVID-19 vaccination declined  Influenza vaccination declined    Return in 3 months (on 12/25/2023) for physical.  Patient was given opportunity to ask questions. Patient verbalized understanding of the plan and was able to repeat key elements of the plan. All questions were answered to their satisfaction.    I, Ellender Hose, NP, have reviewed all documentation for this visit. The documentation on 09/25/23 for the exam, diagnosis, procedures, and orders are all accurate and complete.    IF YOU HAVE BEEN REFERRED TO A SPECIALIST, IT MAY TAKE 1-2 WEEKS TO SCHEDULE/PROCESS THE REFERRAL. IF YOU HAVE NOT HEARD FROM US/SPECIALIST IN TWO WEEKS, PLEASE GIVE [place] A CALL AT [phone].

## 2023-11-15 ENCOUNTER — Telehealth: Payer: Self-pay | Admitting: Internal Medicine

## 2023-11-15 NOTE — Telephone Encounter (Signed)
Fax received from Dr. Sallye Ober with [facility] to perform a robotic assisted hysterectomy with salipingectomy on patient.  Patient needs surgery clearance. Surgery is pending. Patient was seen on 06/28/23. Office protocol is a risk assessment can be sent to surgeon if patient has been seen in 60 days or less.   Dr Celine Mans- it looks like you gave the clearance at last visit, but this is past our protocol (seen on 06/28/23) so I wanted to make sure you still feel comfortable with clearing her, or would you like for me to schedule her a follow up first?  Thanks!

## 2023-11-15 NOTE — Telephone Encounter (Signed)
For Melissa [name] no objections to proceeding with hysterectomy if medically necessary from a pulmonary perspective. Please refer to my previous evaluation for further details.

## 2023-11-19 NOTE — Telephone Encounter (Signed)
Copy of this note and the note from 06/28/23 faxed to central [place] [facility]

## 2023-12-19 ENCOUNTER — Ambulatory Visit (INDEPENDENT_AMBULATORY_CARE_PROVIDER_SITE_OTHER): Payer: Medicaid Other | Admitting: Family Medicine

## 2023-12-19 ENCOUNTER — Encounter: Payer: Self-pay | Admitting: Family Medicine

## 2023-12-19 VITALS — BP 120/84 | HR 67 | Temp 98.2°F | Ht 65.0 in | Wt 221.0 lb

## 2023-12-19 DIAGNOSIS — G44219 Episodic tension-type headache, not intractable: Secondary | ICD-10-CM | POA: Diagnosis not present

## 2023-12-19 DIAGNOSIS — E66812 Obesity, class 2: Secondary | ICD-10-CM | POA: Diagnosis not present

## 2023-12-19 DIAGNOSIS — F10920 Alcohol use, unspecified with intoxication, uncomplicated: Secondary | ICD-10-CM

## 2023-12-19 DIAGNOSIS — R7303 Prediabetes: Secondary | ICD-10-CM

## 2023-12-19 DIAGNOSIS — G901 Familial dysautonomia [Riley-Day]: Secondary | ICD-10-CM | POA: Diagnosis not present

## 2023-12-19 DIAGNOSIS — D6869 Other thrombophilia: Secondary | ICD-10-CM | POA: Diagnosis not present

## 2023-12-19 DIAGNOSIS — Z6836 Body mass index (BMI) 36.0-36.9, adult: Secondary | ICD-10-CM

## 2023-12-19 DIAGNOSIS — R519 Headache, unspecified: Secondary | ICD-10-CM

## 2023-12-19 MED ORDER — NURTEC 75 MG PO TBDP: 75.0000 mg | ORAL_TABLET | Freq: Every day | ORAL | 0 refills | Status: DC | PRN

## 2023-12-19 MED ORDER — WEGOVY 0.25 MG/0.5ML ~~LOC~~ SOAJ: 0.2500 mg | SUBCUTANEOUS | 1 refills | Status: DC

## 2023-12-19 NOTE — Assessment & Plan Note (Signed)
 She is encouraged to strive for BMI less than 30 to decrease cardiac risk. Advised to aim for at least 150 minutes of exercise per week.

## 2023-12-19 NOTE — Assessment & Plan Note (Signed)
Eliquis 5 mg BID d/t PE

## 2023-12-19 NOTE — Progress Notes (Signed)
I,Jameka J Llittleton, CMA,acting as a Neurosurgeon for [employer], NP.,have documented all relevant documentation on the behalf of Ellender Hose, NP,as directed by  Ellender Hose, NP while in the presence of Ellender Hose, NP.  Subjective:  Patient ID: [name] , female    DOB: [birth year] , 37 y.o.   MRN: [id number]  No chief complaint on file.   HPI  Patient is a 37 year old who presents today because she is not feeling well. She states that she has been feeling "off" since she flew to and fro [place] some weeks ago, she thinks it is related to her diagnosis of dysautonomia and wants to know how to treat. Advised patient that dysautonomia is a combination of various symptoms because it is based on malfunction of the autonomic nervous system, so symptoms are usually treated as they occur. Patient voiced understanding.  Patient is an established patient at [facility], she has her pap smear scheduled with Dr Sallye Ober 01/14/2024 , patient states that she had her last 3 children there and would like to continue her GYN care there.     Past Medical History:  Diagnosis Date   Abnormal Pap smear 2010   Repeat pap done;Hysteroscopy;Last pap 2012;was normal   Anemia 2010   Iron supplements PP   Asthma    Has a nebulizer;triggered by stress   Chest pain    Colon polyps    Headache(784.0)    Heart attack [facility])    MD thinks may have had a heart attack;Cardiologist @ [facility]   History of CVA (cerebrovascular accident) 04/09/2013   Pt reports presumptive "stroke" when she was 37y.o.; followed by [facility] neurology for "chronic nerve pain" and has been Rx'd Hydrocodone and Mobic   Hypertension    Infection    BV;may get frequently, UTI   Multiple food allergies    Nerve pain    Chronic;d/t possible stroke and MI   Placenta previa antepartum 2010   Placenta moved 3 days before baby was born   Pre-diabetes    Shortness of breath    Sickle cell trait (HCC)    Stroke (HCC) 2013   MD thought may have  had a stroke and heart attack;has neurologist @ [facility]   Vitamin D deficiency      Family History  Problem Relation Age of Onset   Sickle cell trait Father    Hypertension Mother    Anemia Mother    Asthma Mother    Diabetes Mother    Cervical cancer Paternal Aunt    Cervical cancer Paternal Grandmother    Diabetes Paternal Grandmother    Diabetes Paternal Grandfather    Diabetes Paternal Aunt    Asthma Brother    Asthma Daughter    Asthma Son      Current Outpatient Medications:    albuterol (VENTOLIN HFA) 108 (90 Base) MCG/ACT inhaler, Inhale 2 puffs into the lungs every 6 (six) hours as needed for wheezing or shortness of breath., Disp: 8 g, Rfl: 2   amLODipine (NORVASC) 5 MG tablet, Take 1 tablet (5 mg total) by mouth daily., Disp: 90 tablet, Rfl: 2   apixaban (ELIQUIS) 5 MG TABS tablet, Take 1 tablet (5 mg total) by mouth 2 (two) times daily., Disp: 180 tablet, Rfl: 2   bisoprolol (ZEBETA) 5 MG tablet, Take 1 tablet (5 mg total) by mouth daily., Disp: 90 tablet, Rfl: 3   Blood Pressure Monitoring (B-D ASSURE BPM/AUTO ARM CUFF) MISC, 1 each by  Does not apply route as directed., Disp: 1 each, Rfl: 0   Cholecalciferol (VITAMIN D-3 PO), Take 1 capsule by mouth every evening., Disp: , Rfl:    Rimegepant Sulfate (NURTEC) 75 MG TBDP, Take 1 tablet (75 mg total) by mouth daily as needed., Disp: 15 tablet, Rfl: 0   Semaglutide-Weight Management (WEGOVY) 0.25 MG/0.5ML SOAJ, Inject 0.25 mg into the skin every 7 (seven) days., Disp: 2 mL, Rfl: 1   Allergies  Allergen Reactions   Ginger Anaphylaxis     Review of Systems  Constitutional: Negative.   HENT: Negative.    Eyes: Negative.   Respiratory: Negative.    Cardiovascular: Negative.   Endocrine: Negative.   Genitourinary: Negative.   Musculoskeletal: Negative.   Neurological:  Positive for headaches.  Psychiatric/Behavioral: Negative.       Today's Vitals   12/19/23 1403  BP: 120/84  Pulse: 67  Temp:  98.2 F (36.8 C)  Weight: 221 lb (100.2 kg)  Height: 5\' 5"  (1.651 m)  PainSc: 4   PainLoc: Head   Body mass index is 36.78 kg/m.  Wt Readings from Last 3 Encounters:  12/19/23 221 lb (100.2 kg)  09/25/23 221 lb (100.2 kg)  06/28/23 216 lb 9.6 oz (98.2 kg)    The ASCVD Risk score (Arnett DK, et al., 2019) failed to calculate for the following reasons:   The 2019 ASCVD risk score is only valid for ages 50 to 39   Risk score cannot be calculated because patient has a medical history suggesting prior/existing ASCVD  Objective:  Physical Exam Cardiovascular:     Rate and Rhythm: Normal rate and regular rhythm.  Pulmonary:     Effort: Pulmonary effort is normal.     Breath sounds: Normal breath sounds.  Neurological:     Mental Status: She is alert and oriented to person, place, and time.  Psychiatric:        Behavior: Behavior normal.         Assessment And Plan:  Dysautonomia (HCC)  Acquired thrombophilia (HCC) Assessment & Plan: Eliquis 5 mg BID d/t PE   Episodic tension-type headache, not intractable -     Nurtec; Take 1 tablet (75 mg total) by mouth daily as needed.  Dispense: 15 tablet; Refill: 0  Class 2 severe obesity due to excess calories with serious comorbidity and body mass index (BMI) of 36.0 to 36.9 in adult [facility]) Assessment & Plan: She is encouraged to strive for BMI less than 30 to decrease cardiac risk. Advised to aim for at least 150 minutes of exercise per week.    Other orders -     Wegovy; Inject 0.25 mg into the skin every 7 (seven) days.  Dispense: 2 mL; Refill: 1    Return in 2 months (on 02/19/2024), or if symptoms worsen or fail to improve, for follow up wt mgt.  Patient was given opportunity to ask questions. Patient verbalized understanding of the plan and was able to repeat key elements of the plan. All questions were answered to their satisfaction.    I, Ellender Hose, NP, have reviewed all documentation for this visit. The documentation  on 12/30/23 for the exam, diagnosis, procedures, and orders are all accurate and complete.   IF YOU HAVE BEEN REFERRED TO A SPECIALIST, IT MAY TAKE 1-2 WEEKS TO SCHEDULE/PROCESS THE REFERRAL. IF YOU HAVE NOT HEARD FROM US/SPECIALIST IN TWO WEEKS, PLEASE GIVE [place] A CALL AT [phone].

## 2023-12-30 DIAGNOSIS — G901 Familial dysautonomia [Riley-Day]: Secondary | ICD-10-CM | POA: Insufficient documentation

## 2023-12-30 DIAGNOSIS — R519 Headache, unspecified: Secondary | ICD-10-CM | POA: Insufficient documentation

## 2023-12-30 DIAGNOSIS — R7303 Prediabetes: Secondary | ICD-10-CM | POA: Insufficient documentation

## 2024-01-08 ENCOUNTER — Encounter: Payer: Self-pay | Admitting: Family Medicine

## 2024-01-08 ENCOUNTER — Ambulatory Visit (INDEPENDENT_AMBULATORY_CARE_PROVIDER_SITE_OTHER): Payer: Medicaid Other | Admitting: Family Medicine

## 2024-01-08 VITALS — BP 112/80 | HR 83 | Temp 97.9°F | Ht 65.0 in | Wt 220.0 lb

## 2024-01-08 DIAGNOSIS — D6869 Other thrombophilia: Secondary | ICD-10-CM

## 2024-01-08 DIAGNOSIS — E66812 Obesity, class 2: Secondary | ICD-10-CM

## 2024-01-08 DIAGNOSIS — F321 Major depressive disorder, single episode, moderate: Secondary | ICD-10-CM | POA: Diagnosis not present

## 2024-01-08 DIAGNOSIS — Z83438 Family history of other disorder of lipoprotein metabolism and other lipidemia: Secondary | ICD-10-CM | POA: Insufficient documentation

## 2024-01-08 DIAGNOSIS — Z0000 Encounter for general adult medical examination without abnormal findings: Secondary | ICD-10-CM | POA: Diagnosis not present

## 2024-01-08 DIAGNOSIS — Z2821 Immunization not carried out because of patient refusal: Secondary | ICD-10-CM

## 2024-01-08 DIAGNOSIS — I10 Essential (primary) hypertension: Secondary | ICD-10-CM | POA: Diagnosis not present

## 2024-01-08 DIAGNOSIS — R7309 Other abnormal glucose: Secondary | ICD-10-CM

## 2024-01-08 DIAGNOSIS — Z6836 Body mass index (BMI) 36.0-36.9, adult: Secondary | ICD-10-CM

## 2024-01-08 LAB — POCT URINALYSIS DIPSTICK
Bilirubin, UA: NEGATIVE
Blood, UA: NEGATIVE
Glucose, UA: NEGATIVE
Ketones, UA: NEGATIVE
Leukocytes, UA: NEGATIVE
Nitrite, UA: NEGATIVE
Protein, UA: NEGATIVE
Spec Grav, UA: >=1.03 — AB (ref 1.010–1.025)
Urobilinogen, UA: 0.2 U/dL
pH, UA: 5.5 (ref 5.0–8.0)

## 2024-01-08 MED ORDER — ESCITALOPRAM OXALATE 10 MG PO TABS: 10.0000 mg | ORAL_TABLET | Freq: Every day | ORAL | 5 refills | Status: DC

## 2024-01-08 NOTE — Progress Notes (Signed)
 I,Melissa [name], CMA,acting as a neurosurgeon for Melissa Lynch, NP.,have documented all relevant documentation on the behalf of Melissa Creighton, NP,as directed by  Melissa Creighton, NP while in the presence of Melissa Creighton, NP.  Subjective:    Patient ID: Melissa [name] , female    DOB: 1986-02-17 , 38 y.o.   MRN: [id number]  Chief Complaint  Patient presents with   Annual Exam    HPI  Patient is a 38 year old female who presents today for her annual physical exam. Patient is followed by Dr.Dillard at [name] and her next  appointment which will include pap smear is coming up on 01/26/2024. Patient denies having chest pain, headaches or shortness of breath. Patient doesn't have any questions or concerns at this time.  Discussed positive depression score and patient wants to get on medication for depression.     Past Medical History:  Diagnosis Date   Abnormal Pap smear 2010   Repeat pap done;Hysteroscopy;Last pap 2012;was normal   Anemia 2010   Iron  supplements PP   Asthma    Has a nebulizer;triggered by stress   Chest pain    Colon polyps    Headache(784.0)    Heart attack [facility])    MD thinks may have had a heart attack;Cardiologist @ [name]   History of CVA (cerebrovascular accident) 04/09/2013   Pt reports presumptive stroke when she was 38y.o.; followed by [name]y for chronic nerve pain and has been Rx'd Hydrocodone  and Mobic   Hypertension    Infection    BV;may get frequently, UTI   Multiple food allergies    Nerve pain    Chronic;d/t possible stroke and MI   Placenta previa antepartum 2010   Placenta moved 3 days before baby was born   Pre-diabetes    Shortness of breath    Sickle cell trait (HCC)    Stroke (HCC) 2013   MD thought may have had a stroke and heart attack;has [facility] @ [name]   Vitamin D deficiency      Family History  Problem Relation Age of Onset   Sickle cell trait Father    Hypertension Mother    Anemia  Mother    Asthma Mother    Diabetes Mother    Cervical cancer Paternal Aunt    Cervical cancer Paternal Grandmother    Diabetes Paternal Grandmother    Diabetes Paternal Grandfather    Diabetes Paternal Aunt    Asthma Brother    Asthma Daughter    Asthma Son      Current Outpatient Medications:    albuterol  (VENTOLIN  HFA) 108 (90 Base) MCG/ACT inhaler, Inhale 2 puffs into the lungs every 6 (six) hours as needed for wheezing or shortness of breath., Disp: 8 g, Rfl: 2   amLODipine  (NORVASC ) 5 MG tablet, Take 1 tablet (5 mg total) by mouth daily., Disp: 90 tablet, Rfl: 2   apixaban  (ELIQUIS ) 5 MG TABS tablet, Take 1 tablet (5 mg total) by mouth 2 (two) times daily., Disp: 180 tablet, Rfl: 2   bisoprolol  (ZEBETA ) 5 MG tablet, Take 1 tablet (5 mg total) by mouth daily., Disp: 90 tablet, Rfl: 3   Blood Pressure Monitoring (B-D ASSURE BPM/AUTO ARM CUFF) MISC, 1 each by Does not apply route as directed., Disp: 1 each, Rfl: 0   Cholecalciferol (VITAMIN D-3 PO), Take 1 capsule by mouth every evening., Disp: , Rfl:    escitalopram  (LEXAPRO ) 10 MG tablet, Take 1 tablet (10 mg total)  by mouth daily., Disp: 30 tablet, Rfl: 5   Rimegepant Sulfate (NURTEC) 75 MG TBDP, Take 1 tablet (75 mg total) by mouth daily as needed., Disp: 15 tablet, Rfl: 0   Semaglutide -Weight Management (WEGOVY ) 0.25 MG/0.5ML SOAJ, Inject 0.25 mg into the skin every 7 (seven) days., Disp: 2 mL, Rfl: 1   Allergies  Allergen Reactions   Ginger Anaphylaxis         Social History   Tobacco Use  Smoking Status Never  Smokeless Tobacco Never   Social History   Substance and Sexual Activity  Alcohol Use Not Currently   Alcohol/week: 0.0 standard drinks of alcohol   Comment: last use 03/16/2020      Review of Systems  Constitutional: Negative.   HENT: Negative.    Eyes: Negative.   Respiratory: Negative.    Cardiovascular: Negative.   Gastrointestinal: Negative.   Endocrine: Negative.   Genitourinary: Negative.    Musculoskeletal: Negative.   Skin: Negative.   Allergic/Immunologic: Negative.   Neurological: Negative.   Hematological: Negative.   Psychiatric/Behavioral:  Positive for behavioral problems.      Today's Vitals   01/08/24 0814  BP: 112/80  Pulse: 83  Temp: 97.9 F (36.6 C)  TempSrc: Oral  Weight: 220 lb (99.8 kg)  PainSc: 0-No pain   Body mass index is 36.61 kg/m.  Wt Readings from Last 3 Encounters:  01/08/24 220 lb (99.8 kg)  12/19/23 221 lb (100.2 kg)  09/25/23 221 lb (100.2 kg)     Objective:  Physical Exam Constitutional:      Appearance: Normal appearance.  HENT:     Head: Normocephalic.     Nose: Nose normal.  Cardiovascular:     Rate and Rhythm: Normal rate and regular rhythm.     Pulses: Normal pulses.     Heart sounds: Normal heart sounds.  Pulmonary:     Effort: Pulmonary effort is normal.     Breath sounds: Normal breath sounds.  Abdominal:     General: Bowel sounds are normal.  Musculoskeletal:        General: Normal range of motion.  Skin:    General: Skin is warm and dry.  Neurological:     General: No focal deficit present.     Mental Status: She is alert and oriented to person, place, and time. Mental status is at baseline.  Psychiatric:        Mood and Affect: Mood normal.         Assessment And Plan:     Encounter for general adult medical examination w/o abnormal findings  Essential hypertension -     POCT urinalysis dipstick -     Microalbumin / creatinine urine ratio -     EKG 12-Lead -     CBC -     CMP14+EGFR  Abnormal glucose -     Hemoglobin A1c  Current moderate episode of major depressive disorder, unspecified whether recurrent (HCC) Assessment & Plan: Start escitalopram  10 mg every day.  Orders: -     Escitalopram  Oxalate; Take 1 tablet (10 mg total) by mouth daily.  Dispense: 30 tablet; Refill: 5  Family history of mixed hyperlipidemia -     Lipid panel  Acquired thrombophilia (HCC) Assessment &  Plan: On eliquis  5 mg BID for Pulmon.embolism.   COVID-19 vaccination declined  Class 2 severe obesity due to excess calories with serious comorbidity and body mass index (BMI) of 36.0 to 36.9 in adult [name]) Assessment & Plan: She is  encouraged to strive for BMI less than 30 to decrease cardiac risk. Advised to aim for at least 150 minutes of exercise per week. She states her efforts to lose weight has not yield result. Will start Wegovy  0.25 mg SQ.      Return for 1 year physical, move next appt by 1 month. Patient was given opportunity to ask questions. Patient verbalized understanding of the plan and was able to repeat key elements of the plan. All questions were answered to their satisfaction.   Melissa Creighton, NP I, [facility] Creighton, NP, have reviewed all documentation for this visit. The documentation on 01/08/2024 for the exam, diagnosis, procedures, and orders are all accurate and complete.

## 2024-01-08 NOTE — Patient Instructions (Signed)

## 2024-01-09 LAB — CMP14+EGFR
ALT: 10 [IU]/L (ref 0–32)
AST: 14 [IU]/L (ref 0–40)
Albumin: 4.4 g/dL (ref 3.9–4.9)
Alkaline Phosphatase: 91 [IU]/L (ref 44–121)
BUN/Creatinine Ratio: 8 — ABNORMAL LOW (ref 9–23)
BUN: 6 mg/dL (ref 6–20)
Bilirubin Total: 0.5 mg/dL (ref 0.0–1.2)
CO2: 21 mmol/L (ref 20–29)
Calcium: 9.5 mg/dL (ref 8.7–10.2)
Chloride: 103 mmol/L (ref 96–106)
Creatinine, Ser: 0.76 mg/dL (ref 0.57–1.00)
Globulin, Total: 3.1 g/dL (ref 1.5–4.5)
Glucose: 95 mg/dL (ref 70–99)
Potassium: 4.5 mmol/L (ref 3.5–5.2)
Sodium: 140 mmol/L (ref 134–144)
Total Protein: 7.5 g/dL (ref 6.0–8.5)
eGFR: 104 mL/min/{1.73_m2} (ref >59)

## 2024-01-09 LAB — CBC
Hematocrit: 37.8 % (ref 34.0–46.6)
Hemoglobin: 11.6 g/dL (ref 11.1–15.9)
MCH: 25.3 pg — ABNORMAL LOW (ref 26.6–33.0)
MCHC: 30.7 g/dL — ABNORMAL LOW (ref 31.5–35.7)
MCV: 83 fL (ref 79–97)
Platelets: 274 10*3/uL (ref 150–450)
RBC: 4.58 x10E6/uL (ref 3.77–5.28)
RDW: 14.6 % (ref 11.7–15.4)
WBC: 5.8 10*3/uL (ref 3.4–10.8)

## 2024-01-09 LAB — LIPID PANEL
Chol/HDL Ratio: 2.8 {ratio} (ref 0.0–4.4)
Cholesterol, Total: 132 mg/dL (ref 100–199)
HDL: 47 mg/dL (ref >39)
LDL Chol Calc (NIH): 70 mg/dL (ref 0–99)
Triglycerides: 74 mg/dL (ref 0–149)
VLDL Cholesterol Cal: 15 mg/dL (ref 5–40)

## 2024-01-09 LAB — HEMOGLOBIN A1C
Est. average glucose Bld gHb Est-mCnc: 120 mg/dL
Hgb A1c MFr Bld: 5.8 % — ABNORMAL HIGH (ref 4.8–5.6)

## 2024-01-09 NOTE — Assessment & Plan Note (Signed)
 Start escitalopram 10 mg every day.

## 2024-01-09 NOTE — Assessment & Plan Note (Signed)
 She is encouraged to strive for BMI less than 30 to decrease cardiac risk. Advised to aim for at least 150 minutes of exercise per week. She states her efforts to lose weight has not yield result. Will start Wegovy 0.25 mg SQ.

## 2024-01-09 NOTE — Assessment & Plan Note (Signed)
 On eliquis 5 mg BID for Pulmon.embolism.

## 2024-01-10 LAB — MICROALBUMIN / CREATININE URINE RATIO
Creatinine, Urine: 326.1 mg/dL
Microalb/Creat Ratio: 3 mg/g{creat} (ref 0–29)
Microalbumin, Urine: 8.8 ug/mL

## 2024-01-22 ENCOUNTER — Other Ambulatory Visit: Payer: Self-pay | Admitting: Family Medicine

## 2024-01-22 DIAGNOSIS — F321 Major depressive disorder, single episode, moderate: Secondary | ICD-10-CM

## 2024-01-23 ENCOUNTER — Other Ambulatory Visit: Payer: Self-pay | Admitting: Family Medicine

## 2024-02-19 ENCOUNTER — Ambulatory Visit: Payer: Medicaid Other | Admitting: Family Medicine

## 2024-02-20 ENCOUNTER — Other Ambulatory Visit: Payer: Self-pay | Admitting: Family Medicine

## 2024-02-20 DIAGNOSIS — G43909 Migraine, unspecified, not intractable, without status migrainosus: Secondary | ICD-10-CM | POA: Insufficient documentation

## 2024-02-20 MED ORDER — RIZATRIPTAN BENZOATE 10 MG PO TABS: 10.0000 mg | ORAL_TABLET | Freq: Once | ORAL | 2 refills | Status: AC | PRN

## 2024-03-17 ENCOUNTER — Encounter: Payer: Self-pay | Admitting: Pulmonary Disease

## 2024-03-17 ENCOUNTER — Ambulatory Visit: Admitting: Pulmonary Disease

## 2024-03-17 VITALS — BP 114/78 | HR 106 | Ht 65.0 in | Wt 224.0 lb

## 2024-03-17 DIAGNOSIS — J45901 Unspecified asthma with (acute) exacerbation: Secondary | ICD-10-CM | POA: Diagnosis not present

## 2024-03-17 DIAGNOSIS — J4541 Moderate persistent asthma with (acute) exacerbation: Secondary | ICD-10-CM

## 2024-03-17 MED ORDER — PREDNISONE 20 MG PO TABS: ORAL_TABLET | ORAL | 0 refills | Status: AC

## 2024-03-17 MED ORDER — FLUTICASONE-SALMETEROL 500-50 MCG/ACT IN AEPB: 1.0000 | INHALATION_SPRAY | Freq: Two times a day (BID) | RESPIRATORY_TRACT | 11 refills | Status: DC

## 2024-03-17 MED ORDER — METHYLPREDNISOLONE ACETATE 80 MG/ML IJ SUSP
80.0000 mg | Freq: Once | INTRAMUSCULAR | Status: AC
  Administered 2024-03-17: 80 mg via INTRAMUSCULAR

## 2024-03-17 NOTE — Patient Instructions (Addendum)
 Steroid injection today  Take prednisone 40 mg for 5 days and 20 mg for 5 days.  New prescription for Advair, 1 puff twice a day.  If things are improved we could get better you can try to Trelegy I provided this is a sample.  1 puff once a day on hand.  If using Advair or Trelegy need to rinse out your mouth thoroughly with water after every use.  If you find the Trelegy is more beneficial we may need to add back the second inhaler to add to the Advair, please let us know  Return to clinic in 4 to 8 weeks with Dr. Celine Mans or NP

## 2024-03-17 NOTE — Progress Notes (Signed)
 @Patient  ID: Melissa [name], female    DOB: 1986-12-15, 38 y.o.   MRN: [id number]  Chief Complaint  Patient presents with   Acute Visit    Pt states been three days now with dry cough, tightness and coughing spells     Referring provider: Ellender Hose, NP  HPI:   38 y.o. woman history of PE on chronic Eliquis doing unprovoked nature as well as asthma on only as needed nebulized albuterol presents with 3 days of cough congestion chest discomfort chest tightness concerning for acute asthma exacerbation.  Multiple prior pulmonary notes reviewed.  Sounds like # controlled.  Only using albuterol as needed.  In the past have been on triple inhaled therapy after discussion with her in terms of is not prior inhalers etc.  Regardless.  Feels like pollens are starting to come out.  This is affected her breathing.  A lot of nasal congestion.  Other congestion in her chest.  Productive cough.  Doing worse.  Chest tightness, shortness of breath.  New and worse.  Historically gets better with steroids.  Chest exam is clear but air movement is diminished, excursion diminished.  No accessory muscle use.  We discussed trial of steroids and if not improving or worsening in the coming 24 to 48 hours we need to seek emergency care.  We also discussed the role and rationale for resuming mains inhalers given exacerbation.  See below.  Questionaires / Pulmonary Flowsheets:   ACT:  Asthma Control Test ACT Total Score  06/28/2023  1:33 PM 17    MMRC:     No data to display          Epworth:      No data to display          Tests:   FENO:  No results found for: "NITRICOXIDE"  PFT:     No data to display          WALK:     09/08/2015   10:08 AM  SIX MIN WALK  Supplimental Oxygen during Test? (L/min) No  Tech Comments: slow pace/stopped x 1 to rest/c/o chest tightness and SOB//lmr    Imaging: Personally reviewed and as per EMR and discussion of this note No results found.  Lab  Results: Personally reviewed, eosinophils notably 300 and 03/2023 CBC    Component Value Date/Time   WBC 5.8 01/08/2024 0903   WBC 5.5 03/10/2023 1140   RBC 4.58 01/08/2024 0903   RBC 4.48 03/10/2023 1140   HGB 11.6 01/08/2024 0903   HCT 37.8 01/08/2024 0903   PLT 274 01/08/2024 0903   MCV 83 01/08/2024 0903   MCH 25.3 (L) 01/08/2024 0903   MCH 26.8 03/10/2023 1140   MCHC 30.7 (L) 01/08/2024 0903   MCHC 32.2 03/10/2023 1140   RDW 14.6 01/08/2024 0903   LYMPHSABS 2.0 03/10/2023 1140   MONOABS 0.4 03/10/2023 1140   EOSABS 0.3 03/10/2023 1140   BASOSABS 0.0 03/10/2023 1140    BMET    Component Value Date/Time   NA 140 01/08/2024 0903   K 4.5 01/08/2024 0903   CL 103 01/08/2024 0903   CO2 21 01/08/2024 0903   GLUCOSE 95 01/08/2024 0903   GLUCOSE 100 (H) 03/10/2023 1140   BUN 6 01/08/2024 0903   CREATININE 0.76 01/08/2024 0903   CALCIUM 9.5 01/08/2024 0903   GFRNONAA >60 03/10/2023 1140   GFRAA >60 05/24/2020 1800    BNP    Component Value Date/Time   BNP 16.4  02/11/2019 1536    ProBNP    Component Value Date/Time   PROBNP 17.0 09/08/2015 1045    Specialty Problems       Pulmonary Problems   Mild persistent chronic asthma without complication   Onset reported in childhood  Spirometry 09/08/2015 wnl including fef 25-75. -  02/20/2019  After extensive coaching inhaler device,  effectiveness =    75% from a baseline of 25% > continue dulera 100 2bid/ singulair   03/06/2019  added spacer. >>Trial dulera 200. Add PPI   - 03/06/2019 Allergy profile: IgE 25 and RAST neg - 04/20/2019   rec dulera 200 up to 2 q 12 h prn and try off singulair - 05/27/2019  After extensive coaching inhaler device,  effectiveness =    90% restart dulera 100 2bid           Allergies  Allergen Reactions   Ginger Anaphylaxis    Immunization History  Administered Date(s) Administered   Influenza,inj,Quad PF,6+ Mos 10/20/2013, 11/30/2014, 09/17/2019, 10/18/2020   Influenza-Unspecified  10/18/2020, 10/22/2022   PFIZER Comirnaty(Gray Top)Covid-19 Tri-Sucrose Vaccine 02/28/2021   PFIZER(Purple Top)SARS-COV-2 Vaccination 11/02/2020   Pneumococcal Polysaccharide-23 10/30/2013   Tdap 09/03/2013, 10/18/2020    Past Medical History:  Diagnosis Date   Abnormal Pap smear 2010   Repeat pap done;Hysteroscopy;Last pap 2012;was normal   Anemia 2010   Iron supplements PP   Asthma    Has a nebulizer;triggered by stress   Chest pain    Colon polyps    Headache(784.0)    Heart attack [facility])    MD thinks may have had a heart attack;Cardiologist @ [facility]   History of CVA (cerebrovascular accident) 04/09/2013   Pt reports presumptive "stroke" when she was 38y.o.; followed by [facility] neurology for "chronic nerve pain" and has been Rx'd Hydrocodone and Mobic   Hypertension    Infection    BV;may get frequently, UTI   Multiple food allergies    Nerve pain    Chronic;d/t possible stroke and MI   Placenta previa antepartum 2010   Placenta moved 3 days before baby was born   Pre-diabetes    Shortness of breath    Sickle cell trait (HCC)    Stroke (HCC) 2013   MD thought may have had a stroke and heart attack;has neurologist @ [facility]   Vitamin D deficiency     Tobacco History: Social History   Tobacco Use  Smoking Status Never  Smokeless Tobacco Never   Counseling given: Not Answered   Continue to not smoke  Outpatient Encounter Medications as of 03/17/2024  Medication Sig   albuterol (VENTOLIN HFA) 108 (90 Base) MCG/ACT inhaler Inhale 2 puffs into the lungs every 6 (six) hours as needed for wheezing or shortness of breath.   apixaban (ELIQUIS) 5 MG TABS tablet Take 1 tablet (5 mg total) by mouth 2 (two) times daily.   bisoprolol (ZEBETA) 5 MG tablet Take 1 tablet (5 mg total) by mouth daily.   Blood Pressure Monitoring (B-D ASSURE BPM/AUTO ARM CUFF) MISC 1 each by Does not apply route as directed.   Cholecalciferol (VITAMIN D-3 PO) Take 1 capsule by mouth  every evening.   escitalopram (LEXAPRO) 10 MG tablet TAKE 1 TABLET BY MOUTH EVERY DAY   fluticasone-salmeterol (ADVAIR DISKUS) 500-50 MCG/ACT AEPB Inhale 1 puff into the lungs in the morning and at bedtime.   predniSONE (DELTASONE) 20 MG tablet Take 2 tablets (40 mg total) by mouth daily with breakfast for 5 days, THEN 1  tablet (20 mg total) daily with breakfast for 5 days.   rizatriptan (MAXALT) 10 MG tablet Take 1 tablet (10 mg total) by mouth once as needed for migraine. May repeat in 2 hours if needed   Semaglutide-Weight Management (WEGOVY) 0.25 MG/0.5ML SOAJ Inject 0.25 mg into the skin every 7 (seven) days.   amLODipine (NORVASC) 5 MG tablet Take 1 tablet (5 mg total) by mouth daily.   [EXPIRED] methylPREDNISolone acetate (DEPO-MEDROL) injection 80 mg    No facility-administered encounter medications on file as of 03/17/2024.     Review of Systems  Review of Systems  No orthopnea or PND.  Comprehensive review of systems otherwise negative. Physical Exam  BP 114/78 (BP Location: Left Arm, Patient Position: Sitting, Cuff Size: Normal)   Pulse (!) 106   Ht 5\' 5"  (1.651 m)   Wt 223 lb 15.8 oz (101.6 kg)   SpO2 99%   BMI 37.27 kg/m   Wt Readings from Last 5 Encounters:  03/17/24 223 lb 15.8 oz (101.6 kg)  01/08/24 220 lb (99.8 kg)  12/19/23 221 lb (100.2 kg)  09/25/23 221 lb (100.2 kg)  06/28/23 216 lb 9.6 oz (98.2 kg)    BMI Readings from Last 5 Encounters:  03/17/24 37.27 kg/m  01/08/24 36.61 kg/m  12/19/23 36.78 kg/m  09/25/23 36.78 kg/m  06/28/23 36.04 kg/m     Physical Exam General: Mildly ill-appearing, sitting up in chair Neck: Supple, no JVP Pulmonary: Normal work of breathing, no accessory muscle use, chest excursion is decreased, no abnormal breath sounds noted Cardiovascular: Borderline tachycardic, regular rate and rhythm, no murmur MSK: No synovitis no joint effusion Neuro: Normal gait, no weakness Psych: Normal mood, full affect   Assessment &  Plan:   Acute exacerbation of asthma: Chest tightness chest discomfort.  Increased cough.  Lung exam is tight.  No overt wheezing.  Depo Solu-Medrol 80 mg today.  Prednisone taper.  Resume Advair high-dose discus.  May need to add LAMA therapy in the future, trial of Trelegy via samples provided if Advair is not properly controlling symptoms in the coming weeks.  If symptoms worsen or fail to improve in the next 24 to 48 hours advised she present to the ED for further evaluation.  Chest tightness, discomfort: Suspect related to asthma.  She reports good adherence to Eliquis.  No missed doses.  PE felt unlikely.  Not clearly reproducible with exertion, coronary disease felt unlikely.  Assess response to steroid therapy.  If not improving must present to the ED as discussed above.   Return in about 4 weeks (around 04/14/2024) for 4-8 weeks Dr. Celine Mans or NP.   Karren Burly, MD 03/17/2024   This appointment required 41 minutes of patient care (this includes precharting, chart review, review of results, face-to-face care, etc.).

## 2024-03-18 ENCOUNTER — Ambulatory Visit: Payer: Medicaid Other | Admitting: Family Medicine

## 2024-04-10 ENCOUNTER — Ambulatory Visit: Payer: Self-pay | Admitting: Family Medicine

## 2024-04-10 ENCOUNTER — Encounter: Payer: Self-pay | Admitting: Family Medicine

## 2024-04-10 VITALS — BP 136/70 | Temp 98.5°F | Ht 65.0 in | Wt 220.8 lb

## 2024-04-10 DIAGNOSIS — E66812 Obesity, class 2: Secondary | ICD-10-CM | POA: Diagnosis not present

## 2024-04-10 DIAGNOSIS — D6869 Other thrombophilia: Secondary | ICD-10-CM

## 2024-04-10 DIAGNOSIS — Z6836 Body mass index (BMI) 36.0-36.9, adult: Secondary | ICD-10-CM

## 2024-04-10 DIAGNOSIS — I10 Essential (primary) hypertension: Secondary | ICD-10-CM

## 2024-04-10 NOTE — Assessment & Plan Note (Signed)
 BP Readings from Last 3 Encounters:  04/10/24 136/70  03/17/24 114/78  01/08/24 112/80   Chronic. Fair.Continue Amlodipine 5mg  every day and Bisoprolol 5 mg every day.

## 2024-04-10 NOTE — Assessment & Plan Note (Addendum)
 Filed Weights   04/10/24 0822  Weight: 220 lb 12.8 oz (100.2 kg)   She is encouraged to strive for BMI less than 30 to decrease cardiac risk. Advised to aim for at least 150 minutes of exercise per week. She states her efforts to lose weight has not yielded much result. Will start Wegovy 0.25 mg SQ.

## 2024-04-10 NOTE — Assessment & Plan Note (Signed)
 On eliquis 5 mg BID for Pulmon.embolism.

## 2024-04-10 NOTE — Progress Notes (Addendum)
 I,Brynli Ollis T Deloria Lair, CMA,acting as a Neurosurgeon for [employer], NP.,have documented all relevant documentation on the behalf of Ellender Hose, NP,as directed by  Ellender Hose, NP while in the presence of Ellender Hose, NP.  Subjective:  Patient ID: Melissa [name] , female    DOB: 1986-07-31 , 38 y.o.   MRN: [id number]  Chief Complaint  Patient presents with   Weight Check    HPI  Patient is a 38 year old female who presents today for weight management. She states that she has not received Wegovy SQ injections since her last visit due to prior authorization issues. She denied having a recent weight documented. She has no specific questions or concerns.      Past Medical History:  Diagnosis Date   Abnormal Pap smear 2010   Repeat pap done;Hysteroscopy;Last pap 2012;was normal   Anemia 2010   Iron supplements PP   Asthma    Has a nebulizer;triggered by stress   Chest pain    Colon polyps    Headache(784.0)    Heart attack [facility])    MD thinks may have had a heart attack;Cardiologist @ [facility]   History of CVA (cerebrovascular accident) 04/09/2013   Pt reports presumptive "stroke" when she was 38y.o.; followed by [facility] neurology for "chronic nerve pain" and has been Rx'd Hydrocodone and Mobic   Hypertension    Infection    BV;may get frequently, UTI   Multiple food allergies    Nerve pain    Chronic;d/t possible stroke and MI   Placenta previa antepartum 2010   Placenta moved 3 days before baby was born   Pre-diabetes    Shortness of breath    Sickle cell trait (HCC)    Stroke (HCC) 2013   MD thought may have had a stroke and heart attack;has neurologist @ [facility]   Vitamin D deficiency      Family History  Problem Relation Age of Onset   Sickle cell trait Father    Hypertension Mother    Anemia Mother    Asthma Mother    Diabetes Mother    Cervical cancer Paternal Aunt    Cervical cancer Paternal Grandmother    Diabetes Paternal Grandmother    Diabetes Paternal  Grandfather    Diabetes Paternal Aunt    Asthma Brother    Asthma Daughter    Asthma Son      Current Outpatient Medications:    albuterol (VENTOLIN HFA) 108 (90 Base) MCG/ACT inhaler, Inhale 2 puffs into the lungs every 6 (six) hours as needed for wheezing or shortness of breath., Disp: 8 g, Rfl: 2   apixaban (ELIQUIS) 5 MG TABS tablet, Take 1 tablet (5 mg total) by mouth 2 (two) times daily., Disp: 180 tablet, Rfl: 2   bisoprolol (ZEBETA) 5 MG tablet, Take 1 tablet (5 mg total) by mouth daily., Disp: 90 tablet, Rfl: 3   Blood Pressure Monitoring (B-D ASSURE BPM/AUTO ARM CUFF) MISC, 1 each by Does not apply route as directed., Disp: 1 each, Rfl: 0   Cholecalciferol (VITAMIN D-3 PO), Take 1 capsule by mouth every evening., Disp: , Rfl:    escitalopram (LEXAPRO) 10 MG tablet, TAKE 1 TABLET BY MOUTH EVERY DAY, Disp: 90 tablet, Rfl: 2   fluticasone-salmeterol (ADVAIR DISKUS) 500-50 MCG/ACT AEPB, Inhale 1 puff into the lungs in the morning and at bedtime., Disp: 1 each, Rfl: 11   rizatriptan (MAXALT) 10 MG tablet, Take 1 tablet (10 mg total) by mouth once as needed  for migraine. May repeat in 2 hours if needed, Disp: 30 tablet, Rfl: 2   amLODipine (NORVASC) 5 MG tablet, Take 1 tablet (5 mg total) by mouth daily., Disp: 90 tablet, Rfl: 2   Semaglutide-Weight Management (WEGOVY) 0.25 MG/0.5ML SOAJ, Inject 0.25 mg into the skin every 7 (seven) days. (Patient not taking: Reported on 04/10/2024), Disp: 2 mL, Rfl: 1   Allergies  Allergen Reactions   Ginger Anaphylaxis     Review of Systems  Constitutional: Negative.   HENT: Negative.    Respiratory: Negative.    Cardiovascular: Negative.   Neurological: Negative.   Psychiatric/Behavioral: Negative.       Today's Vitals   04/10/24 0822  BP: 136/70  Temp: 98.5 F (36.9 C)  SpO2: 98%  Weight: 220 lb 12.8 oz (100.2 kg)  Height: 5\' 5"  (1.651 m)   Body mass index is 36.74 kg/m.  Wt Readings from Last 3 Encounters:  04/10/24 220 lb 12.8  oz (100.2 kg)  03/17/24 223 lb 15.8 oz (101.6 kg)  01/08/24 220 lb (99.8 kg)     Objective:  Physical Exam HENT:     Head: Normocephalic.  Pulmonary:     Effort: Pulmonary effort is normal.     Breath sounds: Normal breath sounds.  Abdominal:     General: Bowel sounds are normal.  Neurological:     Mental Status: She is alert.         Assessment And Plan:  Class 2 severe obesity due to excess calories with serious comorbidity and body mass index (BMI) of 36.0 to 36.9 in adult [facility]) Assessment & Plan: [facility] Weights   04/10/24 0822  Weight: 220 lb 12.8 oz (100.2 kg)   She is encouraged to strive for BMI less than 30 to decrease cardiac risk. Advised to aim for at least 150 minutes of exercise per week. She states her efforts to lose weight has not yielded much result. Will start Wegovy 0.25 mg SQ.   Acquired thrombophilia (HCC) Assessment & Plan: On eliquis 5 mg BID for Pulmon.embolism.   Essential hypertension Assessment & Plan: BP Readings from Last 3 Encounters:  04/10/24 136/70  03/17/24 114/78  01/08/24 112/80   Chronic. Fair.Continue Amlodipine 5mg  every day and Bisoprolol 5 mg every day.      Return in about 2 months (around 06/10/2024) for Weight Management.  Patient was given opportunity to ask questions. Patient verbalized understanding of the plan and was able to repeat key elements of the plan. All questions were answered to their satisfaction.   I, Ellender Hose, NP, have reviewed all documentation for this visit. The documentation on 04/10/2024 for the exam, diagnosis, procedures, and orders are all accurate and complete.     IF YOU HAVE BEEN REFERRED TO A SPECIALIST, IT MAY TAKE 1-2 WEEKS TO SCHEDULE/PROCESS THE REFERRAL. IF YOU HAVE NOT HEARD FROM US/SPECIALIST IN TWO WEEKS, PLEASE GIVE [place] A CALL AT [phone].   THE PATIENT IS ENCOURAGED TO PRACTICE SOCIAL DISTANCING DUE TO THE COVID-19 PANDEMIC.

## 2024-04-14 ENCOUNTER — Other Ambulatory Visit: Payer: Self-pay | Admitting: Obstetrics and Gynecology

## 2024-04-28 NOTE — Progress Notes (Signed)
 Cardiology Office Note:  .   Date:  05/11/2024  ID:  CAYCIE ESPAILLAT, DOB [birth year], MRN [id number] PCP: [name], NP  [facility] Providers Cardiologist:  Ola Berger, MD    History of Present Illness: Aaron Aas   Derrica A Arave is a 38 y.o. female with history of palpitations, PE, hypertension, chest pain, asthma, and possible conversion disorder.  She was initially evaluated by cardiology in 2020 following an ED visit in the setting.  She was found to have inferior ST elevation. Emergent cardiac catheterization revealed normal coronary arteries.  Echocardiogram at the time showed EF 60 to 65%, no RWMA, she was started on amlodipine  due to possible vasospasm.  Repeat echocardiogram in 2022 in the setting of palpitations, shortness of breath showed EF 55%, no RWMA.  14-day ZIO showed sinus rhythm with rare PACs and PVCs she was last seen in the office on 01/29/2023 and reported intermittent chest pain and palpitations.  She was restarted on amlodipine .  ETT was normal.  She was hospitalized in 01/2023 in the setting of PE. Echocardiogram was negative for right heart strain. She was started on Eliquis . Lower extremity Dopplers were negative for DVT.  She's here for Preop clearance for hysteroscopic endometrium ablation by Dr. Renea Carrion May 20. Patient denies chest pain, dyspnea, palpitations, edema. She is on her feet all day at work and has 4 children 15,11,10 & 4 yrs old. Stays hydrated and keeps a gatorade with her since diagnosed with dysautotonia. She says her PCP wants us  to take over her Eliquis  Rx., but we haven't received a message regarding this.  ROS:    Studies Reviewed: Aaron Aas         Prior CV Studies:    Echo 05/31/23 IMPRESSIONS     1. Left ventricular ejection fraction, by estimation, is 50 to 55%. The  left ventricle has low normal function. The left ventricle has no regional  wall motion abnormalities.   2. Right ventricular systolic function is normal. The right  ventricular  size is normal. There is normal pulmonary artery systolic pressure.   3. The mitral valve is normal in structure. No evidence of mitral valve  regurgitation. No evidence of mitral stenosis.   4. The aortic valve is normal in structure. Aortic valve regurgitation is  not visualized. No aortic stenosis is present.   5. The inferior vena cava is normal in size with greater than 50%  respiratory variability, suggesting right atrial pressure of 3 mmHg.   ETT 01/2023:    There is PR depression in the inferior leads on resting EKG.   A Bruce protocol stress test was performed. Patient exercised for 8 min and 0 sec. Maximum HR of 173 bpm. MPHR 93.0 %. Peak METS 10.1 . The patient experienced no angina during the test. The patient achieved the target heart rate. Normal blood pressure and normal heart rate response noted during stress. Heart rate recovery was normal.   No ST deviation was noted. ECG was interpretable and conclusive. The ECG was negative for ischemia.   This is a low risk study.   Limited echo with bubble study 01/2023: IMPRESSIONS     1. EF 55% distal septal hypokinesis.   2. Limted echo no doppler or color flow.   3. Negative bubble study for right to left shunt.   4. Distal septal hypokinesis . Left ventricular ejection fraction, by  estimation, is 55%. The left ventricular internal cavity size was mildly  dilated.   5.  Right ventricular systolic function is normal. The right ventricular  size is normal.   6. The inferior vena cava is normal in size with greater than 50%  respiratory variability, suggesting right atrial pressure of 3 mmHg.   FINDINGS   Left Ventricle: Distal septal hypokinesis. Left ventricular ejection  fraction, by estimation, is 55%. The left ventricular internal cavity size  was mildly dilated.   Right Ventricle: The right ventricular size is normal. No increase in  right ventricular wall thickness. Right ventricular systolic function is   normal.   Pericardium: There is no evidence of pericardial effusion.   Venous: The inferior vena cava is normal in size with greater than 50%  respiratory variability, suggesting right atrial pressure of 3 mmHg.   IAS/Shunts: Agitated saline contrast was given intravenously to evaluate  for intracardiac shunting.   Additional Comments: Limted echo no doppler or color flow. EF 55% distal  septal hypokinesis. Negative bubble study for right to left shunt.       Risk Assessment/Calculations:             Physical Exam:   VS:  BP 122/76   Pulse 81   Ht 5\' 5"  (1.651 m)   Wt 217 lb (98.4 kg)   BMI 36.11 kg/m    Wt Readings from Last 3 Encounters:  05/11/24 217 lb (98.4 kg)  05/07/24 220 lb (99.8 kg)  05/04/24 218 lb 6.4 oz (99.1 kg)    GEN: Obese, in no acute distress NECK: No JVD; No carotid bruits CARDIAC: RRR, no murmurs, rubs, gallops RESPIRATORY:  Clear to auscultation without rales, wheezing or rhonchi  ABDOMEN: Soft, non-tender, non-distended EXTREMITIES:  No edema; No deformity   ASSESSMENT AND PLAN: .    Preop clearance for hysteroscopic endometrium ablation by Dr. Renea Carrion May 20. Patient has been stable from a cardiac standpoint, periop risk low and METs almost 9. Can proceed with above surgery without prior cardiac workup. Pulmonary has addressed her eliquis  hold. According to the Revised Cardiac Risk Index (RCRI), her Perioperative Risk of Major Cardiac Event is (%): 0.4  Her Functional Capacity in METs is: 8.97 according to the [facility] Activity Status Index (DASI).   Chest pain: Cath in 2020 revealed normal coronary arteries.  Echocardiogram at the time showed EF 60 to 65%, no RWMA, she was started on amlodipine  due to possible vasospasm. ETT in 01/2023 was normal.  Limited echo in 02/19/2023 showed EF 55%, distal septal hypokinesis.   limited echo 05/2023 normal LVEF no WMA. No recent chest pain. LDL 70 on labs in Jan.   Palpitations: 14-day ZIO showed sinus  rhythm with rare PACs and PVCs.  No symptoms on zebeta    Hypertension: BP well controlled. Continue current antihypertensive regimen.    History of PE: Diagnosed in 01/2023.  Limited echo at that time showed no evidence of right heart strain.  Most recent CT angio of the chest showed no evidence of acute PE, the previously seen lingular and left lower lobe PEs were no longer evident.  Continue Eliquis .   Dysautonomia post covid 19 improved with electrolyte management          Dispo: f/u with DR. Ross in 1 yr.  Signed, Theotis Flake, PA-C

## 2024-04-29 ENCOUNTER — Telehealth: Payer: Self-pay

## 2024-04-29 NOTE — Telephone Encounter (Signed)
   Pre-operative Risk Assessment    Patient Name: Melissa [name]  DOB: 21-Mar-1986 MRN: [id number]   Date of last office visit: 05/02/23 Marlana Silvan, NP Date of next office visit: 05/11/24 Theotis Flake, PA   Request for Surgical Clearance    Procedure:   HYSTEROSCOPY WITH ENDOMETRIAL ABLATION  Date of Surgery:  Clearance 05/19/24                                Surgeon:  DR Renea Carrion, MD Surgeon's Group or Practice Name:  [facility] Phone number:  [phone]  EXT. 1102  OR EXT. 1504 Fax number:  [phone]   Type of Clearance Requested:   - Medical  - Pharmacy:  Hold Apixaban  (Eliquis )     Type of Anesthesia:  Not Indicated   Additional requests/questions:    Signed, Collin Deal   04/29/2024, 11:31 AM

## 2024-04-29 NOTE — Telephone Encounter (Signed)
   Name: Melissa [name]  DOB: [birth year]  MRN: [id number]  Primary Cardiologist: Ola Berger, MD  Chart reviewed as part of pre-operative protocol coverage. The patient has an upcoming visit scheduled with Reesa Cannon, PA on 05/11/24 at which time clearance can be addressed in case there are any issues that would impact surgical recommendations.  Hysteroscopy is not scheduled until 05/19/24 as below. I added preop FYI to appointment note so that provider is aware to address at time of outpatient visit.  Per office protocol the cardiology provider should forward their finalized clearance decision and recommendations regarding antiplatelet therapy to the requesting party below.    Eliquis  is not prescribed by cardiology, recommendations regarding holding of Eliquis  will need to come from prescribing provider.   I will route this message as FYI to requesting party and remove this message from the preop box as separate preop APP input not needed at this time.   Please call with any questions.  Adilyn Humes D Ermon Sagan, NP  04/29/2024, 12:56 PM

## 2024-04-30 ENCOUNTER — Other Ambulatory Visit: Payer: Self-pay

## 2024-04-30 MED ORDER — WEGOVY 0.25 MG/0.5ML ~~LOC~~ SOAJ
0.2500 mg | SUBCUTANEOUS | 1 refills | Status: DC
Start: 2024-04-30 — End: 2024-05-04

## 2024-05-04 ENCOUNTER — Encounter: Payer: Self-pay | Admitting: Internal Medicine

## 2024-05-04 ENCOUNTER — Ambulatory Visit: Admitting: Internal Medicine

## 2024-05-04 VITALS — BP 114/77 | HR 78 | Ht 65.0 in | Wt 218.4 lb

## 2024-05-04 DIAGNOSIS — J4530 Mild persistent asthma, uncomplicated: Secondary | ICD-10-CM | POA: Diagnosis not present

## 2024-05-04 DIAGNOSIS — I2782 Chronic pulmonary embolism: Secondary | ICD-10-CM

## 2024-05-04 DIAGNOSIS — Z01811 Encounter for preprocedural respiratory examination: Secondary | ICD-10-CM | POA: Diagnosis not present

## 2024-05-04 DIAGNOSIS — I2609 Other pulmonary embolism with acute cor pulmonale: Secondary | ICD-10-CM

## 2024-05-04 DIAGNOSIS — Z7901 Long term (current) use of anticoagulants: Secondary | ICD-10-CM

## 2024-05-04 DIAGNOSIS — J301 Allergic rhinitis due to pollen: Secondary | ICD-10-CM

## 2024-05-04 DIAGNOSIS — G901 Familial dysautonomia [Riley-Day]: Secondary | ICD-10-CM | POA: Diagnosis not present

## 2024-05-04 MED ORDER — FLUTICASONE-SALMETEROL 250-50 MCG/ACT IN AEPB: 1.0000 | INHALATION_SPRAY | Freq: Two times a day (BID) | RESPIRATORY_TRACT | 11 refills | Status: AC

## 2024-05-04 NOTE — Patient Instructions (Addendum)
 It was a pleasure to see you today!  Please schedule follow up with myself in 12 months.  If my schedule is not open yet, we will contact you with a reminder closer to that time. Please call [phone] if you haven't heard from us  a month before, and always call us  sooner if issues or concerns arise. You can also send us  a message through MyChart, but but aware that this is not to be used for urgent issues and it may take up to 5-7 days to receive a reply. Please be aware that you will likely be able to view your results before I have a chance to respond to them. Please give us  5 business days to respond to any non-urgent results.   VISIT SUMMARY:  You came in today for a follow-up after experiencing an asthma flare last month. You are currently on Advair 500 and have seen improvement in your symptoms. You also have a history of dysautonomia following COVID-19, which is being managed with a beta blocker and lifestyle changes. You are scheduled for a uterine ablation and tubal removal and require medical clearance for the procedure.  YOUR PLAN:  -ASTHMA: Asthma is a condition where your airways narrow and swell, making it difficult to breathe. Your asthma is currently controlled with Advair 500, and you have not needed your albuterol  rescue inhaler recently. We are prescribing Advair 250, one puff twice daily. If your symptoms remain well-controlled, you can reduce this to one puff daily in 1-2 months. If you become symptomatic, increase back to twice daily. Refills for Advair have been provided. For any allergy  symptoms, you can use over-the-counter Claritin  or Flonase . Remember to gargle with water after using your inhaler to prevent thrush.  -DYSAUTONOMIA POST-COVID: Dysautonomia is a condition where the autonomic nervous system does not work properly, affecting heart rate and blood pressure. Your symptoms have improved with a beta blocker and lifestyle modifications. Continue taking your beta  blocker and maintain your sugar and salt intake. Keep Gatorade available to help manage your symptoms.  - PREOPERATIVE PULMONARY EVALUATION: You require medical clearance for your upcoming uterine ablation and tubal removal. You are low risk for pulmonary complications.  - PULMONARY EMBOLISM - continue eliquis . Hold for 24 hours prior to any scheduled procedures and resume the next day.   INSTRUCTIONS:  Schedule a follow-up appointment in one year to monitor your asthma control. Return sooner if your respiratory symptoms worsen.

## 2024-05-04 NOTE — Progress Notes (Signed)
 Melissa [name]    [id number]    25-Apr-1986  Primary Care Physician:Ohenhen, Deatra Face, NP Date of Appointment: 05/04/2024 Established Patient Visit  Chief complaint:   Chief Complaint  Patient presents with   Follow-up     HPI: Melissa [name] is a woman with mild persistent asthma and dysautonomia  Interval Updates:  Discussed the use of AI scribe software for clinical note transcription with the patient, who gave verbal consent to proceed.  History of Present Illness Melissa [name] is a 38 year old female with asthma and dysautonomia who presents for follow-up after an asthma flare.  Last month, she experienced an asthma flare that necessitated IV steroids and a course of oral steroids. Currently, she is on Advair 500, taking one puff in the morning and one at night, and reports improvement. She has not needed to use her albuterol  rescue inhaler recently. She suspects that weather changes and pollen may have triggered her symptoms, as she experiences episodes where her heart rate increases and oxygen  levels drop during physical activity, but these resolve with rest and deep breathing.  She has a history of dysautonomia following COVID-19, for which she is on a beta blocker. Her symptoms are not as severe as before, and managing her sugar and salt intake, along with keeping Gatorade nearby, helps regulate her condition.  She works from home, which helps minimize exposure to potential asthma triggers. She does not currently take any medications for allergies and reports that her allergy  symptoms are rare and well-controlled. No reflux or heartburn is reported.  She is scheduled for a uterine ablation and tubal removal and requires medical clearance for the procedure.  Current Regimen: advair 500 1 puff bid, albuterol  prn Asthma Triggers: seasonal allergies, exertion Exacerbations in the last year: one in march 2025 History of hospitalization or intubation: never Allergy   Testing/rhinitis: seasonal GERD: denies ACT:  Asthma Control Test ACT Total Score  05/04/2024 10:04 AM 21  06/28/2023  1:33 PM 17    I have reviewed the patient's family social and past medical history and updated as appropriate.   Past Medical History:  Diagnosis Date   Abnormal Pap smear 2010   Repeat pap done;Hysteroscopy;Last pap 2012;was normal   Anemia 2010   Iron  supplements PP   Asthma    Has a nebulizer;triggered by stress   Chest pain    Colon polyps    Headache(784.0)    Heart attack [facility])    MD thinks may have had a heart attack;Cardiologist @ [facility]   History of CVA (cerebrovascular accident) 04/09/2013   Pt reports presumptive "stroke" when she was 38y.o.; followed by [facility] neurology for "chronic nerve pain" and has been Rx'd Hydrocodone  and Mobic   Hypertension    Infection    BV;may get frequently, UTI   Multiple food allergies    Nerve pain    Chronic;d/t possible stroke and MI   Placenta previa antepartum 2010   Placenta moved 3 days before baby was born   Pre-diabetes    Shortness of breath    Sickle cell trait (HCC)    Stroke (HCC) 2013   MD thought may have had a stroke and heart attack;has neurologist @ [facility]   Vitamin D deficiency     Past Surgical History:  Procedure Laterality Date   HYSTEROSCOPY  2010   LEFT HEART CATH AND CORONARY ANGIOGRAPHY N/A 02/18/2019   Procedure: LEFT HEART CATH AND CORONARY ANGIOGRAPHY;  Surgeon: Arleen Lacer, MD;  Location: [facility] INVASIVE CV LAB;  Service: Cardiovascular;  Laterality: N/A;    Family History  Problem Relation Age of Onset   Sickle cell trait Father    Hypertension Mother    Anemia Mother    Asthma Mother    Diabetes Mother    Cervical cancer Paternal Aunt    Cervical cancer Paternal Grandmother    Diabetes Paternal Grandmother    Diabetes Paternal Grandfather    Diabetes Paternal Aunt    Asthma Brother    Asthma Daughter    Asthma Son     Social History    Occupational History   Not on file  Tobacco Use   Smoking status: Never    Passive exposure: Past   Smokeless tobacco: Never  Vaping Use   Vaping status: Never Used  Substance and Sexual Activity   Alcohol use: Not Currently    Alcohol/week: 0.0 standard drinks of alcohol    Comment: last use 03/16/2020   Drug use: No   Sexual activity: Yes    Birth control/protection: None     Physical Exam: Blood pressure 114/77, pulse 78, height 5\' 5"  (1.651 m), weight 218 lb 6.4 oz (99.1 kg), last menstrual period 03/19/2024, SpO2 95%.  Gen:      No acute distress ENT:  no nasal polyps, mucus membranes moist, mild thrush Lungs:    No increased respiratory effort, symmetric chest wall excursion, clear to auscultation bilaterally, no wheezes or crackles CV:         Regular rate and rhythm; no murmurs, rubs, or gallops.  No pedal edema   Data Reviewed: Imaging: I have personally reviewed the CTPE study march 2024 with mosaic attenuation, no PE  PFTs:   Labs: Lab Results  Component Value Date   WBC 5.8 01/08/2024   HGB 11.6 01/08/2024   HCT 37.8 01/08/2024   MCV 83 01/08/2024   PLT 274 01/08/2024   Lab Results  Component Value Date   NA 140 01/08/2024   K 4.5 01/08/2024   CL 103 01/08/2024   CO2 21 01/08/2024     Immunization status: Immunization History  Administered Date(s) Administered   Influenza,inj,Quad PF,6+ Mos 10/20/2013, 11/30/2014, 09/17/2019, 10/18/2020   Influenza-Unspecified 10/18/2020, 10/22/2022   PFIZER Comirnaty(Gray Top)Covid-19 Tri-Sucrose Vaccine 02/28/2021   PFIZER(Purple Top)SARS-COV-2 Vaccination 11/02/2020   Pneumococcal Polysaccharide-23 10/30/2013   Tdap 09/03/2013, 10/18/2020    External Records Personally Reviewed: pulmonary Assessment and Plan Assessment & Plan Mild persistent Asthma Asthma controlled on Advair 500. No current albuterol  need. Possible triggers: seasonal allergies, weather changes. - Prescribe Advair 250, one puff  twice daily. - Allow reduction to one puff daily in 1-2 months if well-controlled. - Advise to increase to twice daily if symptomatic. - Provide refills for Advair. - Instruct to gargle with water post-inhalation to prevent thrush.  Seasonal allergic rhinitis - Recommend over-the-counter Claritin  or Flonase  for allergy  symptoms.  Dysautonomia post-COVID Dysautonomia post-COVID improved with beta blocker and lifestyle modifications. Symptoms include heart rate fluctuations and oxygen  desaturation, improved with rest and electrolyte management. - Continue beta blocker. - Maintain sugar and salt intake and keep Gatorade available for symptom management.  Chronic pulmonary embolism - continue eliquis   Preoperative pulmonary evaluation - low risk for hysteroscopy, score of 0 - hold eliquis  24 hours prior to procedure   Preoperative Risk Calculation: Postoperative respiratory failure (PRF) is considered as failure to wean from mechanical ventilation within 48 hours of surgery or unplanned intubation/reintubation postoperatively. The  validated risk calculator provides a risk estimate of PRF and is anticipated to aid in surgical decision-making and informed patient consent.  However risk can be accepted given the potential benefit of this intervention and it is not prohibitive.  The features of this patient's history that contribute to the pulmonary risk assessment include:    0 to 25 points: Low risk: 1.6% pulmonary complication rate  26 to 44 points: Intermediate risk: 13.3% pulmonary complication rate  45 to 123 points: High risk: 42.1% pulmonary complication rate   ARISCAT: Mazo et al. Anesthesiology 2014; [phone]       Return to Care: Return in about 1 year (around 05/04/2025).   Louie Rover, MD Pulmonary and Critical Care Medicine [facility] Office:[phone]

## 2024-05-05 ENCOUNTER — Telehealth: Payer: Self-pay | Admitting: Internal Medicine

## 2024-05-05 ENCOUNTER — Other Ambulatory Visit: Payer: Self-pay

## 2024-05-05 MED ORDER — WEGOVY 0.25 MG/0.5ML ~~LOC~~ SOAJ: 0.2500 mg | SUBCUTANEOUS | 1 refills | Status: DC

## 2024-05-05 NOTE — Telephone Encounter (Signed)
 Fax received from [facility] to perform a hysteroscopy with endometrial ablation on patient.  Patient needs surgery clearance. Surgery is 05/19/24. Patient was seen on 05/04/24. Office protocol is a risk assessment can be sent to surgeon if patient has been seen in 60 days or less.   Risk assessment done at ov 05/04/24 and copy was faxed to [facility]

## 2024-05-06 ENCOUNTER — Other Ambulatory Visit: Payer: Self-pay | Admitting: Obstetrics and Gynecology

## 2024-05-07 ENCOUNTER — Encounter: Payer: Self-pay | Admitting: Family Medicine

## 2024-05-07 ENCOUNTER — Ambulatory Visit (INDEPENDENT_AMBULATORY_CARE_PROVIDER_SITE_OTHER): Payer: Self-pay | Admitting: Family Medicine

## 2024-05-07 VITALS — BP 124/80 | HR 89 | Temp 98.1°F | Ht 65.0 in | Wt 220.0 lb

## 2024-05-07 DIAGNOSIS — Z01818 Encounter for other preprocedural examination: Secondary | ICD-10-CM | POA: Diagnosis not present

## 2024-05-07 DIAGNOSIS — D6869 Other thrombophilia: Secondary | ICD-10-CM | POA: Diagnosis not present

## 2024-05-07 DIAGNOSIS — G43909 Migraine, unspecified, not intractable, without status migrainosus: Secondary | ICD-10-CM

## 2024-05-07 DIAGNOSIS — Z6836 Body mass index (BMI) 36.0-36.9, adult: Secondary | ICD-10-CM

## 2024-05-07 DIAGNOSIS — E66812 Obesity, class 2: Secondary | ICD-10-CM

## 2024-05-07 MED ORDER — TOPIRAMATE 50 MG PO TABS: 50.0000 mg | ORAL_TABLET | Freq: Every day | ORAL | 2 refills | Status: AC

## 2024-05-07 NOTE — Progress Notes (Signed)
 I,Jameka Melissa [name], CMA,acting as a Neurosurgeon for [employer], NP.,have documented all relevant documentation on the behalf of Melissa Spry, NP,as directed by  Melissa Spry, NP while in the presence of Melissa Spry, NP.  Subjective:  Patient ID: Melissa [name] , female    DOB: 11-23-1986 , 38 y.o.   MRN: [id number]  Chief Complaint  Patient presents with   Pre-op Exam    HPI  Patient is a 38 year old female who presents today for pre- op examination. Patient is scheduled to have surgery on 05/19/24 for ablation s/p hysteroscopic endometrium due to menometrorrhagia.  Patient is on Eliquis  5 mg twice daily for DVT prophylaxis., she is advised to stop Eliquis  48 hours before surgery and start 24 hours after surgery. She is cleared medically but she has an appointment with Cardiology on 05/11/2024.      Past Medical History:  Diagnosis Date   Abnormal Pap smear 2010   Repeat pap done;Hysteroscopy;Last pap 2012;was normal   Anemia 2010   Iron  supplements PP   Asthma    Has a nebulizer;triggered by stress   Chest pain    Colon polyps    Headache(784.0)    Heart attack [facility])    MD thinks may have had a heart attack;Cardiologist @ [facility]   History of CVA (cerebrovascular accident) 04/09/2013   Pt reports presumptive "stroke" when she was 38y.o.; followed by [facility] neurology for "chronic nerve pain" and has been Rx'd Hydrocodone  and Mobic   Hypertension    Infection    BV;may get frequently, UTI   Multiple food allergies    Nerve pain    Chronic;d/t possible stroke and MI   Placenta previa antepartum 2010   Placenta moved 3 days before baby was born   Pre-diabetes    Shortness of breath    Sickle cell trait (HCC)    Stroke (HCC) 2013   MD thought may have had a stroke and heart attack;has neurologist @ [facility]   Vitamin D deficiency      Family History  Problem Relation Age of Onset   Sickle cell trait Father    Hypertension Mother    Anemia Mother    Asthma Mother     Diabetes Mother    Cervical cancer Paternal Aunt    Cervical cancer Paternal Grandmother    Diabetes Paternal Grandmother    Diabetes Paternal Grandfather    Diabetes Paternal Aunt    Asthma Brother    Asthma Daughter    Asthma Son      Current Outpatient Medications:    albuterol  (VENTOLIN  HFA) 108 (90 Base) MCG/ACT inhaler, Inhale 2 puffs into the lungs every 6 (six) hours as needed for wheezing or shortness of breath., Disp: 8 g, Rfl: 2   apixaban  (ELIQUIS ) 5 MG TABS tablet, Take 1 tablet (5 mg total) by mouth 2 (two) times daily., Disp: 180 tablet, Rfl: 2   bisoprolol  (ZEBETA ) 5 MG tablet, Take 1 tablet (5 mg total) by mouth daily., Disp: 90 tablet, Rfl: 3   Blood Pressure Monitoring (B-D ASSURE BPM/AUTO ARM CUFF) MISC, 1 each by Does not apply route as directed., Disp: 1 each, Rfl: 0   Cholecalciferol (VITAMIN D-3 PO), Take 1 capsule by mouth every evening., Disp: , Rfl:    fluticasone -salmeterol (ADVAIR DISKUS) 250-50 MCG/ACT AEPB, Inhale 1 puff into the lungs in the morning and at bedtime., Disp: 1 each, Rfl: 11   rizatriptan  (MAXALT ) 10 MG tablet, Take 1 tablet (10 mg  total) by mouth once as needed for migraine. May repeat in 2 hours if needed, Disp: 30 tablet, Rfl: 2   topiramate  (TOPAMAX ) 50 MG tablet, Take 1 tablet (50 mg total) by mouth daily., Disp: 30 tablet, Rfl: 2   amLODipine  (NORVASC ) 5 MG tablet, Take 5 mg by mouth daily., Disp: , Rfl:    Allergies  Allergen Reactions   Ginger Anaphylaxis     Review of Systems  Constitutional: Negative.   HENT: Negative.    Respiratory: Negative.    Cardiovascular: Negative.   Gastrointestinal: Negative.   Musculoskeletal: Negative.   Neurological:  Positive for headaches.     Today's Vitals   05/07/24 0940  BP: 124/80  Pulse: 89  Temp: 98.1 F (36.7 C)  TempSrc: Oral  Weight: 220 lb (99.8 kg)  Height: 5\' 5"  (1.651 m)  PainSc: 0-No pain   Body mass index is 36.61 kg/m.  Wt Readings from Last 3 Encounters:   05/11/24 217 lb (98.4 kg)  05/07/24 220 lb (99.8 kg)  05/04/24 218 lb 6.4 oz (99.1 kg)    The ASCVD Risk score (Arnett DK, et al., 2019) failed to calculate for the following reasons:   The 2019 ASCVD risk score is only valid for ages 43 to 70   Risk score cannot be calculated because patient has a medical history suggesting prior/existing ASCVD  Objective:  Physical Exam HENT:     Head: Normocephalic.  Cardiovascular:     Rate and Rhythm: Normal rate and regular rhythm.  Pulmonary:     Effort: Pulmonary effort is normal.     Breath sounds: Normal breath sounds.  Skin:    General: Skin is warm and dry.  Neurological:     General: No focal deficit present.     Mental Status: She is alert and oriented to person, place, and time.         Assessment And Plan:  Migraine syndrome Assessment & Plan: Start Topiramate  50 mg every day.  Orders: -     Topiramate ; Take 1 tablet (50 mg total) by mouth daily.  Dispense: 30 tablet; Refill: 2  Pre-op exam -     EKG 12-Lead  Acquired thrombophilia (HCC) -     AMB Referral to [facility]  Class 2 severe obesity due to excess calories with serious comorbidity and body mass index (BMI) of 36.0 to 36.9 in adult [facility]) Assessment & Plan: She is encouraged to strive for BMI less than 30 to decrease cardiac risk. Advised to aim for at least 150 minutes of exercise per week.      Return for Keep next appt.  Patient was given opportunity to ask questions. Patient verbalized understanding of the plan and was able to repeat key elements of the plan. All questions were answered to their satisfaction.    I, Melissa Spry, NP, have reviewed all documentation for this visit. The documentation on 05/14/2024 for the exam, diagnosis, procedures, and orders are all accurate and complete.     IF YOU HAVE BEEN REFERRED TO A SPECIALIST, IT MAY TAKE 1-2 WEEKS TO SCHEDULE/PROCESS THE REFERRAL. IF YOU HAVE NOT HEARD FROM US /SPECIALIST IN TWO  WEEKS, PLEASE GIVE US  A CALL AT [phone].

## 2024-05-11 ENCOUNTER — Ambulatory Visit: Attending: Physician Assistant | Admitting: Physician Assistant

## 2024-05-11 ENCOUNTER — Encounter: Payer: Self-pay | Admitting: Physician Assistant

## 2024-05-11 VITALS — BP 122/76 | HR 81 | Ht 65.0 in | Wt 217.0 lb

## 2024-05-11 DIAGNOSIS — I10 Essential (primary) hypertension: Secondary | ICD-10-CM | POA: Insufficient documentation

## 2024-05-11 DIAGNOSIS — Z01818 Encounter for other preprocedural examination: Secondary | ICD-10-CM | POA: Diagnosis present

## 2024-05-11 DIAGNOSIS — G901 Familial dysautonomia [Riley-Day]: Secondary | ICD-10-CM | POA: Insufficient documentation

## 2024-05-11 DIAGNOSIS — Z86711 Personal history of pulmonary embolism: Secondary | ICD-10-CM | POA: Insufficient documentation

## 2024-05-11 DIAGNOSIS — Z87898 Personal history of other specified conditions: Secondary | ICD-10-CM | POA: Insufficient documentation

## 2024-05-11 DIAGNOSIS — R002 Palpitations: Secondary | ICD-10-CM | POA: Insufficient documentation

## 2024-05-11 LAB — SURGICAL PATHOLOGY

## 2024-05-11 NOTE — Patient Instructions (Addendum)
 Medication Instructions:  Your physician recommends that you continue on your current medications as directed. Please refer to the Current Medication list given to you today.  *If you need a refill on your cardiac medications before your next appointment, please call your pharmacy*  Lab Work: NONE If you have labs (blood work) drawn today and your tests are completely normal, you will receive your results only by: MyChart Message (if you have MyChart) OR A paper copy in the mail If you have any lab test that is abnormal or we need to change your treatment, we will call you to review the results.  Testing/Procedures: NONE  Follow-Up: At [facility], you and your health needs are our priority.  As part of our continuing mission to provide you with exceptional heart care, our providers are all part of one team.  This team includes your primary Cardiologist (physician) and Advanced Practice Providers or APPs (Physician Assistants and Nurse Practitioners) who all work together to provide you with the care you need, when you need it.  Your next appointment:   1 year(s)  Provider:   Ola Berger, MD   We recommend signing up for the patient portal called "MyChart".  Sign up information is provided on this After Visit Summary.  MyChart is used to connect with patients for Virtual Visits (Telemedicine).  Patients are able to view lab/test results, encounter notes, upcoming appointments, etc.  Non-urgent messages can be sent to your provider as well.   To learn more about what you can do with MyChart, go to [email or web address].   Other Instructions

## 2024-05-14 ENCOUNTER — Telehealth: Payer: Self-pay | Admitting: Internal Medicine

## 2024-05-14 ENCOUNTER — Ambulatory Visit: Payer: Self-pay | Admitting: Family Medicine

## 2024-05-14 ENCOUNTER — Encounter: Payer: Self-pay | Admitting: Internal Medicine

## 2024-05-14 NOTE — Telephone Encounter (Signed)
 I will re-fax notes from Theotis Flake, [facility] who saw the pt on 05/11/24. PAC cleared the pt. Also see notes from Katlyn West, NP: Eliquis  is not prescribed by cardiology, recommendations regarding holding of Eliquis  will need to come from prescribing provider.

## 2024-05-14 NOTE — Assessment & Plan Note (Signed)
 She is encouraged to strive for BMI less than 30 to decrease cardiac risk. Advised to aim for at least 150 minutes of exercise per week.

## 2024-05-14 NOTE — Telephone Encounter (Signed)
 Shaylie A Brunty to [facility] Triage (supporting Flo Hummingbird, PA-C)     05/14/24  3:30 PM Good afternoon I was wondering when you would be able to send over my medical clearance for my surgery on Tuesday I am currently at the surgeons office and they are stating , they have not received my clearance from you. They also wanted to know if it was possible to email it as well as fax it. [email or web address] is the email address. Thank you!

## 2024-05-14 NOTE — Telephone Encounter (Signed)
 Pt requesting clearance approval to be sent over. Please advise

## 2024-05-14 NOTE — Telephone Encounter (Signed)
 [facility] needs the risk assessment for this patient. They have not received it yet and the patient 's surgery is May 20th. Please refax or email. Aaron Aas

## 2024-05-14 NOTE — Assessment & Plan Note (Signed)
 Start Topiramate 50 mg every day

## 2024-05-15 NOTE — Telephone Encounter (Signed)
Do you have this one?

## 2024-05-18 ENCOUNTER — Other Ambulatory Visit: Payer: Self-pay

## 2024-05-18 ENCOUNTER — Encounter (HOSPITAL_COMMUNITY): Payer: Self-pay | Admitting: Obstetrics and Gynecology

## 2024-05-18 NOTE — Anesthesia Preprocedure Evaluation (Addendum)
 Anesthesia Evaluation  Patient identified by MRN, date of birth, ID band Patient awake    Reviewed: Allergy  & Precautions, NPO status , Patient's Chart, lab work & pertinent test results, reviewed documented beta blocker date and time   Airway Mallampati: III  TM Distance: >3 FB Neck ROM: Full    Dental  (+) Missing, Dental Advisory Given, Poor Dentition,    Pulmonary asthma , PE   Pulmonary exam normal breath sounds clear to auscultation       Cardiovascular hypertension, Pt. on home beta blockers and Pt. on medications + Past MI  Normal cardiovascular exam Rhythm:Regular Rate:Normal  TTE 2024 1. Left ventricular ejection fraction, by estimation, is 50 to 55%. The  left ventricle has low normal function. The left ventricle has no regional  wall motion abnormalities.   2. Right ventricular systolic function is normal. The right ventricular  size is normal. There is normal pulmonary artery systolic pressure.   3. The mitral valve is normal in structure. No evidence of mitral valve  regurgitation. No evidence of mitral stenosis.   4. The aortic valve is normal in structure. Aortic valve regurgitation is  not visualized. No aortic stenosis is present.   5. The inferior vena cava is normal in size with greater than 50%  respiratory variability, suggesting right atrial pressure of 3 mmHg.   Stress Test 2024   There is PR depression in the inferior leads on resting EKG.   A Bruce protocol stress test was performed. Patient exercised for 8 min and 0 sec. Maximum HR of 173 bpm. MPHR 93.0 %. Peak METS 10.1 . The patient experienced no angina during the test. The patient achieved the target heart rate. Normal blood pressure and normal heart rate response noted during stress. Heart rate recovery was normal.   No ST deviation was noted. ECG was interpretable and conclusive. The ECG was negative for ischemia.   This is a low risk  study.    Neuro/Psych  Headaches PSYCHIATRIC DISORDERS  Depression    CVA, No Residual Symptoms  negative psych ROS   GI/Hepatic negative GI ROS, Neg liver ROS,,,  Endo/Other  negative endocrine ROS    Renal/GU negative Renal ROS  negative genitourinary   Musculoskeletal negative musculoskeletal ROS (+)    Abdominal   Peds  Hematology  (+) Blood dyscrasia (eliquis )   Anesthesia Other Findings   Reproductive/Obstetrics                             Anesthesia Physical Anesthesia Plan  ASA: 3  Anesthesia Plan: General   Post-op Pain Management: Tylenol  PO (pre-op)*   Induction: Intravenous  PONV Risk Score and Plan: 3 and Midazolam, Dexamethasone  and Ondansetron   Airway Management Planned: Oral ETT  Additional Equipment:   Intra-op Plan:   Post-operative Plan: Extubation in OR  Informed Consent: I have reviewed the patients History and Physical, chart, labs and discussed the procedure including the risks, benefits and alternatives for the proposed anesthesia with the patient or authorized representative who has indicated his/her understanding and acceptance.     Dental advisory given  Plan Discussed with: CRNA  Anesthesia Plan Comments: (  )        Anesthesia Quick Evaluation

## 2024-05-18 NOTE — Progress Notes (Signed)
 PCP -[name], NP   Cardiologist -  Elmyra Haggard, MD   PPM/ICD - denies Device Orders - n/a Rep Notified - n/a  Chest x-ray - 02-15-23 EKG - 05-07-24 Stress Test - 02-14-23 (exercise test) ECHO - 05-31-23 Cardiac Cath - 02-18-19  CPAP - denies  DM -denies  Blood Thinner Instructions: apixaban  (ELIQUIS ) Last Dose 05-16-24 Aspirin  Instructions: denies  ERAS Protcol - Clear liquids until 11:00  COVID TEST- n/a  Anesthesia review: yes  Patient verbally denies any shortness of breath, fever, cough and chest pain during phone call   -------------  SDW INSTRUCTIONS given:  Your procedure is scheduled on May 19, 2024.  Report to [facility] "A" at 12:00 P.M., and check in at the Admitting office.  Call this number if you have problems the morning of surgery:  [phone]   Remember:  Do not eat after midnight the night before your surgery  You may drink clear liquids until 11:00 the morning of your surgery.   Clear liquids allowed are: Water, Non-Citrus Juices (without pulp), Carbonated Beverages, Clear Tea, Black Coffee Only, and Gatorade    Take these medicines the morning of surgery with A SIP OF WATER  albuterol  (VENTOLIN  HFA) May Bring with you amLODipine  (NORVASC )  bisoprolol  (ZEBETA )  fluticasone -salmeterol (ADVAIR DISKUS)  topiramate  (TOPAMAX )    As of today, STOP taking any Aspirin  (unless otherwise instructed by your surgeon) Aleve , Naproxen , Ibuprofen , Motrin , Advil , Goody's, BC's, all herbal medications, fish oil, and all vitamins.                      Do not wear jewelry, make up, or nail polish            Do not wear lotions, powders, perfumes/colognes, or deodorant.            Do not shave 48 hours prior to surgery.  Men may shave face and neck.            Do not bring valuables to the hospital.            [facility] is not responsible for any belongings or valuables.  Do NOT Smoke (Tobacco/Vaping) 24 hours prior to your procedure If  you use a CPAP at night, you may bring all equipment for your overnight stay.   Contacts, glasses, dentures or bridgework may not be worn into surgery.      For patients admitted to the hospital, discharge time will be determined by your treatment team.   Patients discharged the day of surgery will not be allowed to drive home, and someone needs to stay with them for 24 hours.    Special instructions:   [place]- Preparing For Surgery  Before surgery, you can play an important role. Because skin is not sterile, your skin needs to be as free of germs as possible. You can reduce the number of germs on your skin by washing with CHG (chlorahexidine gluconate) Soap before surgery.  CHG is an antiseptic cleaner which kills germs and bonds with the skin to continue killing germs even after washing.    Oral Hygiene is also important to reduce your risk of infection.  Remember - BRUSH YOUR TEETH THE MORNING OF SURGERY WITH YOUR REGULAR TOOTHPASTE  Please do not use if you have an allergy  to CHG or antibacterial soaps. If your skin becomes reddened/irritated stop using the CHG.  Do not shave (including legs and underarms) for at least 48  hours prior to first CHG shower. It is OK to shave your face.  Please follow these instructions carefully.   Shower the NIGHT BEFORE SURGERY and the MORNING OF SURGERY with DIAL Soap.   Pat yourself dry with a CLEAN TOWEL.  Wear CLEAN PAJAMAS to bed the night before surgery  Place CLEAN SHEETS on your bed the night of your first shower and DO NOT SLEEP WITH PETS.   Day of Surgery: Please shower morning of surgery  Wear Clean/Comfortable clothing the morning of surgery Do not apply any deodorants/lotions.   Remember to brush your teeth WITH YOUR REGULAR TOOTHPASTE.   Questions were answered. Patient verbalized understanding of instructions.

## 2024-05-18 NOTE — Progress Notes (Signed)
 Anesthesia Chart Review: Same day workup  38 y.o. female with history of palpitations, PE, hypertension, chest pain, asthma, mild Raynaud's, and possible conversion disorder.   She was initially evaluated by cardiology in 2020 following an ED visit in the setting.  She was found to have inferior ST elevation. Emergent cardiac catheterization revealed normal coronary arteries.  Echocardiogram at the time showed EF 60 to 65%, no RWMA, she was started on amlodipine  due to possible vasospasm.  Repeat echocardiogram in 2022 in the setting of palpitations, shortness of breath showed EF 55%, no RWMA.  14-day ZIO showed sinus rhythm with rare PACs and PVCs she was last seen in the office on 01/29/2023 and reported intermittent chest pain and palpitations.  She was restarted on amlodipine .  ETT was normal.  She was hospitalized in 01/2023 in the setting of PE. Echocardiogram was negative for right heart strain. She was started on Eliquis . Lower extremity Dopplers were negative for DVT.  Last seen by Reesa Cannon, PA-C on 05/11/2024 for preop evaluation.  Per note, "Preop clearance for hysteroscopic endometrium ablation by Dr. Renea Carrion May 20. Patient has been stable from a cardiac standpoint, periop risk low and METs almost 9. Can proceed with above surgery without prior cardiac workup. Pulmonary has addressed her eliquis  hold. According to the Revised Cardiac Risk Index (RCRI), her Perioperative Risk of Major Cardiac Event is (%): 0.4. Her Functional Capacity in METs is: 8.97 according to the [facility] Activity Status Index (DASI)."  Follows with pulmonologist Dr. Dione Franks for history of mild persistent asthma and PE. Last seen 05/04/2024.  Per note, symptoms currently well-controlled on Advair.  No recent use of rescue inhaler.  Upcoming surgery was also discussed and she was considered low risk for hysteroscopy.  She was instructed to hold Eliquis  24 hours prior to the procedure.  She will need day of surgery labs  and evaluation.  EKG 05/08/2023: NSR.  Rate 68.  TTE 05/31/2023: 1. Left ventricular ejection fraction, by estimation, is 50 to 55%. The  left ventricle has low normal function. The left ventricle has no regional  wall motion abnormalities.   2. Right ventricular systolic function is normal. The right ventricular  size is normal. There is normal pulmonary artery systolic pressure.   3. The mitral valve is normal in structure. No evidence of mitral valve  regurgitation. No evidence of mitral stenosis.   4. The aortic valve is normal in structure. Aortic valve regurgitation is  not visualized. No aortic stenosis is present.   5. The inferior vena cava is normal in size with greater than 50%  respiratory variability, suggesting right atrial pressure of 3 mmHg.   Exercise tolerance test 02/14/2023:   There is PR depression in the inferior leads on resting EKG.   A Bruce protocol stress test was performed. Patient exercised for 8 min and 0 sec. Maximum HR of 173 bpm. MPHR 93.0 %. Peak METS 10.1 . The patient experienced no angina during the test. The patient achieved the target heart rate. Normal blood pressure and normal heart rate response noted during stress. Heart rate recovery was normal.   No ST deviation was noted. ECG was interpretable and conclusive. The ECG was negative for ischemia.   This is a low risk study.    Edilia Gordon [facility] Stay [facility]/Anesthesiology Phone [phone] 05/18/2024 11:16 AM

## 2024-05-18 NOTE — Telephone Encounter (Signed)
 Yes, I took care of this already, but went ahead and faxed it over to them again (fax [phone])    Refer to phone noted 05/05/24

## 2024-05-19 ENCOUNTER — Telehealth: Payer: Self-pay | Admitting: *Deleted

## 2024-05-19 ENCOUNTER — Other Ambulatory Visit: Payer: Self-pay

## 2024-05-19 ENCOUNTER — Ambulatory Visit (HOSPITAL_COMMUNITY): Admit: 2024-05-19 | Admitting: Obstetrics and Gynecology

## 2024-05-19 ENCOUNTER — Encounter (HOSPITAL_COMMUNITY): Payer: Self-pay | Admitting: Obstetrics and Gynecology

## 2024-05-19 ENCOUNTER — Encounter (HOSPITAL_COMMUNITY)
Admission: RE | Disposition: A | Discharge status: Home / Self Care | Payer: Self-pay | Source: Home / Self Care | Attending: Obstetrics and Gynecology

## 2024-05-19 ENCOUNTER — Ambulatory Visit (HOSPITAL_COMMUNITY)
Admission: RE | Admit: 2024-05-19 | Discharge: 2024-05-19 | Disposition: A | Discharge status: Home / Self Care | Attending: Obstetrics and Gynecology | Admitting: Obstetrics and Gynecology

## 2024-05-19 ENCOUNTER — Ambulatory Visit (HOSPITAL_COMMUNITY): Payer: Self-pay | Admitting: Physician Assistant

## 2024-05-19 ENCOUNTER — Other Ambulatory Visit (HOSPITAL_COMMUNITY): Payer: Self-pay

## 2024-05-19 ENCOUNTER — Ambulatory Visit (HOSPITAL_BASED_OUTPATIENT_CLINIC_OR_DEPARTMENT_OTHER): Payer: Self-pay | Admitting: Physician Assistant

## 2024-05-19 DIAGNOSIS — Z302 Encounter for sterilization: Secondary | ICD-10-CM | POA: Insufficient documentation

## 2024-05-19 DIAGNOSIS — Z7901 Long term (current) use of anticoagulants: Secondary | ICD-10-CM | POA: Insufficient documentation

## 2024-05-19 DIAGNOSIS — Z7722 Contact with and (suspected) exposure to environmental tobacco smoke (acute) (chronic): Secondary | ICD-10-CM | POA: Insufficient documentation

## 2024-05-19 DIAGNOSIS — I252 Old myocardial infarction: Secondary | ICD-10-CM | POA: Insufficient documentation

## 2024-05-19 DIAGNOSIS — N920 Excessive and frequent menstruation with regular cycle: Secondary | ICD-10-CM | POA: Diagnosis not present

## 2024-05-19 DIAGNOSIS — F32A Depression, unspecified: Secondary | ICD-10-CM | POA: Diagnosis not present

## 2024-05-19 DIAGNOSIS — N921 Excessive and frequent menstruation with irregular cycle: Secondary | ICD-10-CM | POA: Insufficient documentation

## 2024-05-19 DIAGNOSIS — D759 Disease of blood and blood-forming organs, unspecified: Secondary | ICD-10-CM | POA: Insufficient documentation

## 2024-05-19 DIAGNOSIS — I10 Essential (primary) hypertension: Secondary | ICD-10-CM

## 2024-05-19 DIAGNOSIS — R002 Palpitations: Secondary | ICD-10-CM | POA: Insufficient documentation

## 2024-05-19 DIAGNOSIS — Z86711 Personal history of pulmonary embolism: Secondary | ICD-10-CM | POA: Diagnosis not present

## 2024-05-19 DIAGNOSIS — Z8673 Personal history of transient ischemic attack (TIA), and cerebral infarction without residual deficits: Secondary | ICD-10-CM | POA: Diagnosis not present

## 2024-05-19 DIAGNOSIS — Z79899 Other long term (current) drug therapy: Secondary | ICD-10-CM | POA: Diagnosis not present

## 2024-05-19 DIAGNOSIS — J45909 Unspecified asthma, uncomplicated: Secondary | ICD-10-CM

## 2024-05-19 HISTORY — PX: HYSTEROSCOPY: SHX211

## 2024-05-19 HISTORY — PX: DILATION AND CURETTAGE OF UTERUS: SHX78

## 2024-05-19 HISTORY — PX: LAPAROSCOPIC BILATERAL SALPINGECTOMY: SHX5889

## 2024-05-19 LAB — CBC
HCT: 38.3 % (ref 36.0–46.0)
Hemoglobin: 12.2 g/dL (ref 12.0–15.0)
MCH: 27 pg (ref 26.0–34.0)
MCHC: 31.9 g/dL (ref 30.0–36.0)
MCV: 84.7 fL (ref 80.0–100.0)
Platelets: 246 10*3/uL (ref 150–400)
RBC: 4.52 MIL/uL (ref 3.87–5.11)
RDW: 15.2 % (ref 11.5–15.5)
WBC: 7.1 10*3/uL (ref 4.0–10.5)
nRBC: 0 % (ref 0.0–0.2)

## 2024-05-19 LAB — BASIC METABOLIC PANEL WITH GFR
Anion gap: 9 (ref 5–15)
BUN: 6 mg/dL (ref 6–20)
CO2: 21 mmol/L — ABNORMAL LOW (ref 22–32)
Calcium: 9 mg/dL (ref 8.9–10.3)
Chloride: 107 mmol/L (ref 98–111)
Creatinine, Ser: 0.74 mg/dL (ref 0.44–1.00)
GFR, Estimated: >60 mL/min (ref >60)
Glucose, Bld: 96 mg/dL (ref 70–99)
Potassium: 3.7 mmol/L (ref 3.5–5.1)
Sodium: 137 mmol/L (ref 135–145)

## 2024-05-19 LAB — TYPE AND SCREEN
ABO/RH(D): O POS
Antibody Screen: NEGATIVE

## 2024-05-19 LAB — POCT PREGNANCY, URINE: Preg Test, Ur: NEGATIVE

## 2024-05-19 SURGERY — ABLATION, ENDOMETRIUM, HYSTEROSCOPIC
Anesthesia: General

## 2024-05-19 SURGERY — ABLATION, ENDOMETRIUM, HYSTEROSCOPIC
Anesthesia: General | Site: Vagina

## 2024-05-19 MED ORDER — FENTANYL CITRATE (PF) 100 MCG/2ML IJ SOLN
INTRAMUSCULAR | Status: AC
  Filled 2024-05-19: qty 2

## 2024-05-19 MED ORDER — DEXAMETHASONE SODIUM PHOSPHATE 10 MG/ML IJ SOLN
INTRAMUSCULAR | Status: DC | PRN
  Administered 2024-05-19: 10 mg via INTRAVENOUS

## 2024-05-19 MED ORDER — HYDROMORPHONE HCL 1 MG/ML IJ SOLN
INTRAMUSCULAR | Status: DC | PRN
Start: 2024-05-19 — End: 2024-05-19
  Administered 2024-05-19: .5 mg via INTRAVENOUS

## 2024-05-19 MED ORDER — ACETAMINOPHEN 500 MG PO TABS
1000.0000 mg | ORAL_TABLET | Freq: Once | ORAL | Status: AC
  Administered 2024-05-19: 1000 mg via ORAL
  Filled 2024-05-19: qty 2

## 2024-05-19 MED ORDER — KETOROLAC TROMETHAMINE 30 MG/ML IJ SOLN
INTRAMUSCULAR | Status: DC | PRN
Start: 2024-05-19 — End: 2024-05-19
  Administered 2024-05-19: 30 mg via INTRAVENOUS

## 2024-05-19 MED ORDER — HYDROMORPHONE HCL 1 MG/ML IJ SOLN
INTRAMUSCULAR | Status: AC
  Filled 2024-05-19: qty 0.5

## 2024-05-19 MED ORDER — FENTANYL CITRATE (PF) 250 MCG/5ML IJ SOLN
INTRAMUSCULAR | Status: DC | PRN
  Administered 2024-05-19: 100 ug via INTRAVENOUS

## 2024-05-19 MED ORDER — OXYCODONE HCL 5 MG PO TABS
5.0000 mg | ORAL_TABLET | Freq: Four times a day (QID) | ORAL | 0 refills | Status: AC | PRN
  Filled 2024-05-19: qty 15, 4d supply, fill #0

## 2024-05-19 MED ORDER — LACTATED RINGERS IV SOLN: INTRAVENOUS | Status: DC

## 2024-05-19 MED ORDER — BUPIVACAINE HCL (PF) 0.25 % IJ SOLN
INTRAMUSCULAR | Status: DC | PRN
  Administered 2024-05-19: 18 mL

## 2024-05-19 MED ORDER — OXYCODONE HCL 5 MG PO TABS
ORAL_TABLET | ORAL | Status: AC
  Filled 2024-05-19: qty 1

## 2024-05-19 MED ORDER — ONDANSETRON HCL 4 MG/2ML IJ SOLN
INTRAMUSCULAR | Status: AC
  Filled 2024-05-19: qty 2

## 2024-05-19 MED ORDER — LIDOCAINE HCL (PF) 2 % IJ SOLN
INTRAMUSCULAR | Status: DC | PRN
  Administered 2024-05-19: 10 mL

## 2024-05-19 MED ORDER — POVIDONE-IODINE 10 % EX SWAB
2.0000 | Freq: Once | CUTANEOUS | Status: AC
  Administered 2024-05-19: 2 via TOPICAL

## 2024-05-19 MED ORDER — AMISULPRIDE (ANTIEMETIC) 5 MG/2ML IV SOLN: 10.0000 mg | Freq: Once | INTRAVENOUS | Status: DC | PRN

## 2024-05-19 MED ORDER — OXYCODONE HCL 5 MG PO TABS
5.0000 mg | ORAL_TABLET | Freq: Once | ORAL | Status: AC | PRN
  Administered 2024-05-19: 5 mg via ORAL

## 2024-05-19 MED ORDER — ORAL CARE MOUTH RINSE: 15.0000 mL | Freq: Once | OROMUCOSAL | Status: AC

## 2024-05-19 MED ORDER — ONDANSETRON HCL 4 MG/2ML IJ SOLN
INTRAMUSCULAR | Status: DC | PRN
Start: 2024-05-19 — End: 2024-05-19
  Administered 2024-05-19: 4 mg via INTRAVENOUS

## 2024-05-19 MED ORDER — PROPOFOL 10 MG/ML IV BOLUS
INTRAVENOUS | Status: DC | PRN
  Administered 2024-05-19: 150 mg via INTRAVENOUS

## 2024-05-19 MED ORDER — LIDOCAINE 2% (20 MG/ML) 5 ML SYRINGE
INTRAMUSCULAR | Status: DC | PRN
  Administered 2024-05-19: 80 mg via INTRAVENOUS

## 2024-05-19 MED ORDER — MIDAZOLAM HCL 2 MG/2ML IJ SOLN
INTRAMUSCULAR | Status: DC | PRN
  Administered 2024-05-19: 2 mg via INTRAVENOUS

## 2024-05-19 MED ORDER — BUPIVACAINE HCL (PF) 0.25 % IJ SOLN
INTRAMUSCULAR | Status: AC
  Filled 2024-05-19: qty 30

## 2024-05-19 MED ORDER — SODIUM CHLORIDE 0.9 % IR SOLN
Status: DC | PRN
  Administered 2024-05-19: 3000 mL

## 2024-05-19 MED ORDER — DEXAMETHASONE SODIUM PHOSPHATE 10 MG/ML IJ SOLN
INTRAMUSCULAR | Status: AC
  Filled 2024-05-19: qty 1

## 2024-05-19 MED ORDER — OXYCODONE HCL 5 MG/5ML PO SOLN: 5.0000 mg | Freq: Once | ORAL | Status: AC | PRN

## 2024-05-19 MED ORDER — PROPOFOL 10 MG/ML IV BOLUS
INTRAVENOUS | Status: AC
  Filled 2024-05-19: qty 20

## 2024-05-19 MED ORDER — LIDOCAINE 2% (20 MG/ML) 5 ML SYRINGE
INTRAMUSCULAR | Status: AC
  Filled 2024-05-19: qty 5

## 2024-05-19 MED ORDER — SUGAMMADEX SODIUM 200 MG/2ML IV SOLN
INTRAVENOUS | Status: DC | PRN
  Administered 2024-05-19: 200 mg via INTRAVENOUS

## 2024-05-19 MED ORDER — CHLORHEXIDINE GLUCONATE 0.12 % MT SOLN
15.0000 mL | Freq: Once | OROMUCOSAL | Status: AC
  Administered 2024-05-19: 15 mL via OROMUCOSAL
  Filled 2024-05-19: qty 15

## 2024-05-19 MED ORDER — IBUPROFEN 600 MG PO TABS
600.0000 mg | ORAL_TABLET | Freq: Four times a day (QID) | ORAL | 1 refills | Status: AC | PRN
  Filled 2024-05-19: qty 30, 8d supply, fill #0

## 2024-05-19 MED ORDER — 0.9 % SODIUM CHLORIDE (POUR BTL) OPTIME
TOPICAL | Status: DC | PRN
  Administered 2024-05-19: 1000 mL

## 2024-05-19 MED ORDER — CEFAZOLIN SODIUM-DEXTROSE 2-3 GM-%(50ML) IV SOLR
INTRAVENOUS | Status: DC | PRN
Start: 2024-05-19 — End: 2024-05-19
  Administered 2024-05-19: 2 g via INTRAVENOUS

## 2024-05-19 MED ORDER — ROCURONIUM BROMIDE 10 MG/ML (PF) SYRINGE
PREFILLED_SYRINGE | INTRAVENOUS | Status: DC | PRN
  Administered 2024-05-19: 50 mg via INTRAVENOUS

## 2024-05-19 MED ORDER — MIDAZOLAM HCL 2 MG/2ML IJ SOLN
INTRAMUSCULAR | Status: AC
  Filled 2024-05-19: qty 2

## 2024-05-19 MED ORDER — FENTANYL CITRATE (PF) 100 MCG/2ML IJ SOLN
25.0000 ug | INTRAMUSCULAR | Status: DC | PRN
  Administered 2024-05-19 (×2): 50 ug via INTRAVENOUS

## 2024-05-19 MED ORDER — LIDOCAINE HCL 2 % IJ SOLN
INTRAMUSCULAR | Status: AC
  Filled 2024-05-19: qty 20

## 2024-05-19 SURGICAL SUPPLY — 41 items
ABLATOR SURESOUND NOVASURE (ABLATOR) IMPLANT
COVER MAYO STAND STRL (DRAPES) ×3 IMPLANT
DERMABOND ADVANCED .7 DNX12 (GAUZE/BANDAGES/DRESSINGS) ×3 IMPLANT
DRSG TELFA 3X8 NADH STRL (GAUZE/BANDAGES/DRESSINGS) IMPLANT
DURAPREP 26ML APPLICATOR (WOUND CARE) ×3 IMPLANT
GLOVE BIO SURGEON STRL SZ7.5 (GLOVE) ×3 IMPLANT
GLOVE BIOGEL PI IND STRL 7.0 (GLOVE) ×6 IMPLANT
GLOVE BIOGEL PI IND STRL 7.5 (GLOVE) ×3 IMPLANT
GLOVE SURG ENC MOIS LTX SZ7.5 (GLOVE) ×3 IMPLANT
GLOVE SURG SS PI 7.0 STRL IVOR (GLOVE) IMPLANT
GLOVE SURG SYN 7.5 E (GLOVE) ×9 IMPLANT
GLOVE SURG SYN 7.5 PF PI (GLOVE) IMPLANT
GLOVE SURG UNDER LTX SZ7.5 (GLOVE) ×6 IMPLANT
GLOVE SURG UNDER POLY LF SZ7 (GLOVE) ×3 IMPLANT
GOWN STRL REUS W/ TWL LRG LVL3 (GOWN DISPOSABLE) ×6 IMPLANT
GOWN STRL REUS W/ TWL XL LVL3 (GOWN DISPOSABLE) IMPLANT
KIT PINK PAD W/HEAD ARE REST (MISCELLANEOUS) ×3 IMPLANT
KIT PINK PAD W/HEAD ARM REST (MISCELLANEOUS) ×3 IMPLANT
KIT PROCEDURE FLUENT (KITS) IMPLANT
KIT TURNOVER KIT B (KITS) ×3 IMPLANT
LIGASURE VESSEL 5MM BLUNT TIP (ELECTROSURGICAL) IMPLANT
NDL INSUFFLATION 14GA 120MM (NEEDLE) ×3 IMPLANT
NDL SPNL 22GX3.5 QUINCKE BK (NEEDLE) IMPLANT
NEEDLE INSUFFLATION 14GA 120MM (NEEDLE) ×3 IMPLANT
NEEDLE SPNL 22GX3.5 QUINCKE BK (NEEDLE) ×3 IMPLANT
NS IRRIG 1000ML POUR BTL (IV SOLUTION) ×3 IMPLANT
PACK LAPAROSCOPY BASIN (CUSTOM PROCEDURE TRAY) ×3 IMPLANT
PAD OB MATERNITY 11 LF (PERSONAL CARE ITEMS) ×3 IMPLANT
SEAL ROD LENS SCOPE MYOSURE (ABLATOR) IMPLANT
SET TUBE SMOKE EVAC HIGH FLOW (TUBING) ×3 IMPLANT
SLEEVE ADV FIXATION 5X100MM (TROCAR) ×3 IMPLANT
SOL .9 NS 3000ML IRR UROMATIC (IV SOLUTION) IMPLANT
SUT MNCRL AB 3-0 PS2 27 (SUTURE) ×6 IMPLANT
SUT VICRYL 0 UR6 27IN ABS (SUTURE) ×3 IMPLANT
TOWEL GREEN STERILE FF (TOWEL DISPOSABLE) ×6 IMPLANT
TRAY FOLEY W/BAG SLVR 14FR (SET/KITS/TRAYS/PACK) ×3 IMPLANT
TROCAR 11X100 Z THREAD (TROCAR) IMPLANT
TROCAR ADV FIXATION 11X100MM (TROCAR) ×3 IMPLANT
TROCAR ADV FIXATION 5X100MM (TROCAR) ×3 IMPLANT
UNDERPAD 30X36 HEAVY ABSORB (UNDERPADS AND DIAPERS) ×3 IMPLANT
WARMER LAPAROSCOPE (MISCELLANEOUS) ×3 IMPLANT

## 2024-05-19 NOTE — Op Note (Signed)
 Preop Diagnosis: 1.Menometrorrhagia 2.Desires Surgical Sterilization  Postop Diagnosis: 1.Menometrorrhagia 2.Desires Surgical Sterilization  Procedure: 1.LAPAROSCOPIC BILATERAL SALPINGECTOMY 2.HYSTEROSCOPY 3.DILATION AND CURETTAGE 4.NOVASURE ABLATION  Anesthesia: General   Attending: Madelene Schanz, MD   Assistant:  Surgical Tech  Findings: Normal appearing bilateral ovaries and tubes.  Normal appearing endometrial cavity.  Pathology: 1.Bilateral fallopian tubes 2.Endometrial Curettings  Fluids: 700 cc  UOP: 200 cc  EBL: 5cc  Complications: None  Procedure: The patient was taken to the operating room after the risks, benefits, alternatives, complications, treatment options, and expected outcomes were discussed with the patient. The patient verbalized understanding, the patient concurred with the proposed plan and consent signed and witnessed. The patient was taken to the Operating Room, identified as Melissa [name]  and procedure verified as sterilization procedure via laparoscopic bilateral salpingectomy and Hysteroscopy D&C and Ablation. A Time Out was held and the above information confirmed.  The patient was placed under general anesthesia per anesthesia staff, the patient was placed in modified dorsal lithotomy position and was prepped, draped, and catheterized in the normal, sterile fashion.  The cervix was visualized and an intrauterine manipulator was placed. A  10 mm umbilical incision was then performed. Veress needle was passed and pneumoperitoneum was established. A 10 mm trocar was advanced into the intraabdominal cavity, the laparoscope was introduced and findings as noted above.  A 10 mm incision was made suprapubically and 5mm trocar advanced into the intraabdominal cavity under direct visualization.  The same was done in the RLQ.  The right fallopian tube was identified and carried out to its fimbriated end and excised with the Ligasure.  The same was done on the  contralateral side.  The 10 mm suprapubic trocar was removed under direct visualization and fascia repaired with 0 vicryl. The RLQ trocar was removed under direct visualization.  The laparoscope was removed after  pneumoperitoneum released.  The umbilical incision fascia was repaired with a figure of eight stitch of 0 vicryl and the skin was reapproximated with 4-0 monocryl via subcuticular stitch at all 3 incision sites.  Dermabond was applied to close the 5mm incision in the RLQ and also used to reinforce the 10mm umbilical and suprapubic incisions.  Attention was turned to the perineum and a bivalve speculum was placed in the patient's vagina and the anterior lip of the cervix was grasped with a single tooth tenaculum. A paracervical block was administered using a total of 10 cc of 2% lidocaine . The uterus sounded to 9 cm. The cervix was dilated for passage of the hysteroscope. The hysteroscope was introduced into the uterine cavity and findings as noted above. Sharp curettage was performed until a gritty texture was noted and curettings were sent to pathology. The hysteroscope was reintroduced and no obvious remaining intracavitary lesions were noted. The Novasure instrument was introduced and ablation performed without difficulty. Hysteroscope reintroduced and good ablation results were noted. All instruments were removed.  Sponge, lap and needle count were correct.  Patient tolerated the procedure well and was returned to the recovery room in good condition.

## 2024-05-19 NOTE — Progress Notes (Signed)
 Dr. Gail Joseph made aware that the patient states she has not taken Bisoprolol  or Amlodipine  in the past 2 weeks. The patient states cardiology is aware of this and was ok with this. Ok per Dr. Gail Joseph. No new orders received.

## 2024-05-19 NOTE — Anesthesia Procedure Notes (Addendum)
 Procedure Name: Intubation Date/Time: 05/19/2024 3:23 PM  Performed by: Bennett Brass, CRNAPre-anesthesia Checklist: Patient identified, Emergency Drugs available, Suction available and Patient being monitored Patient Re-evaluated:Patient Re-evaluated prior to induction Oxygen  Delivery Method: Circle system utilized Preoxygenation: Pre-oxygenation with 100% oxygen  Induction Type: IV induction Ventilation: Mask ventilation without difficulty Laryngoscope Size: Miller and 2 Grade View: Grade I Tube type: Oral Tube size: 7.0 mm Number of attempts: 1 Airway Equipment and Method: Stylet and Oral airway Placement Confirmation: ETT inserted through vocal cords under direct vision, positive ETCO2 and breath sounds checked- equal and bilateral Secured at: 22 cm Tube secured with: Tape Dental Injury: Teeth and Oropharynx as per pre-operative assessment

## 2024-05-19 NOTE — H&P (Signed)
 Melissa [name] is an 38 y.o. female. Pt known to me presenting for hysteroscopy D&C Ablation and Sterilization.  Pertinent Gynecological History:  Last mammogram: normal Date: 05-07-24 Last pap: normal Date: 04-14-24 OB History: G4, P4004   Menstrual History:  Patient's last menstrual period was 05/16/2024 (exact date).    Past Medical History:  Diagnosis Date   Abnormal Pap smear 2010   Repeat pap done;Hysteroscopy;Last pap 2012;was normal   Anemia 2010   Iron  supplements PP   Asthma    Has a nebulizer;triggered by stress   Chest pain    Colon polyps    Headache(784.0)    Heart attack [facility])    MD thinks may have had a heart attack;Cardiologist @ [facility]   History of CVA (cerebrovascular accident) 04/09/2013   Pt reports presumptive "stroke" when she was 38y.o.; followed by [facility] neurology for "chronic nerve pain" and has been Rx'd Hydrocodone  and Mobic   Hypertension    Infection    BV;may get frequently, UTI   Multiple food allergies    Nerve pain    Chronic;d/t possible stroke and MI   Placenta previa antepartum 2010   Placenta moved 3 days before baby was born   Pre-diabetes    Shortness of breath    Sickle cell trait (HCC)    Stroke [facility]) 2013   MD thought may have had a stroke and heart attack;has neurologist @ [facility]   Vitamin D deficiency     Past Surgical History:  Procedure Laterality Date   HYSTEROSCOPY  2010   LEFT HEART CATH AND CORONARY ANGIOGRAPHY N/A 02/18/2019   Procedure: LEFT HEART CATH AND CORONARY ANGIOGRAPHY;  Surgeon: Arleen Lacer, MD;  Location: [facility] INVASIVE CV LAB;  Service: Cardiovascular;  Laterality: N/A;    Family History  Problem Relation Age of Onset   Sickle cell trait Father    Hypertension Mother    Anemia Mother    Asthma Mother    Diabetes Mother    Cervical cancer Paternal Aunt    Cervical cancer Paternal Grandmother    Diabetes Paternal Grandmother    Diabetes Paternal Grandfather    Diabetes Paternal Aunt     Asthma Brother    Asthma Daughter    Asthma Son     Social History:  reports that she has never smoked. She has been exposed to tobacco smoke. She has never used smokeless tobacco. She reports current alcohol use. She reports that she does not use drugs.  Allergies:  Allergies  Allergen Reactions   Ginger Anaphylaxis    Medications Prior to Admission  Medication Sig Dispense Refill Last Dose/Taking   albuterol  (VENTOLIN  HFA) 108 (90 Base) MCG/ACT inhaler Inhale 2 puffs into the lungs every 6 (six) hours as needed for wheezing or shortness of breath. 8 g 2 Past Month   amLODipine  (NORVASC ) 5 MG tablet Take 5 mg by mouth daily.   Past Month   apixaban  (ELIQUIS ) 5 MG TABS tablet Take 1 tablet (5 mg total) by mouth 2 (two) times daily. 180 tablet 2 05/16/2024   bisoprolol  (ZEBETA ) 5 MG tablet Take 1 tablet (5 mg total) by mouth daily. 90 tablet 3 Past Month   Blood Pressure Monitoring (B-D ASSURE BPM/AUTO ARM CUFF) MISC 1 each by Does not apply route as directed. 1 each 0 Taking   fluticasone -salmeterol (ADVAIR DISKUS) 250-50 MCG/ACT AEPB Inhale 1 puff into the lungs in the morning and at bedtime. 1 each 11 Past Month   rizatriptan  (MAXALT )  10 MG tablet Take 1 tablet (10 mg total) by mouth once as needed for migraine. May repeat in 2 hours if needed 30 tablet 2 Taking As Needed   Cholecalciferol (VITAMIN D-3 PO) Take 1 capsule by mouth every evening.   More than a month   topiramate  (TOPAMAX ) 50 MG tablet Take 1 tablet (50 mg total) by mouth daily. 30 tablet 2     Review of Systems Denies F/C/N/V/D  Blood pressure (!) 135/95, pulse 80, temperature 98.5 F (36.9 C), temperature source Oral, resp. rate 18, height 5\' 5"  (1.651 m), weight 98.9 kg, last menstrual period 05/16/2024, SpO2 100%. Physical Exam Lungs unlabored breathing CV RRR Abdomen soft, NT Extremities no calf tenderness  Results for orders placed or performed during the hospital encounter of 05/19/24 (from the past 24  hours)  Pregnancy, urine POC     Status: None   Collection Time: 05/19/24 12:23 PM  Result Value Ref Range   Preg Test, Ur NEGATIVE NEGATIVE  CBC     Status: None   Collection Time: 05/19/24 12:55 PM  Result Value Ref Range   WBC 7.1 4.0 - 10.5 K/uL   RBC 4.52 3.87 - 5.11 MIL/uL   Hemoglobin 12.2 12.0 - 15.0 g/dL   HCT 84.6 96.2 - 95.2 %   MCV 84.7 80.0 - 100.0 fL   MCH 27.0 26.0 - 34.0 pg   MCHC 31.9 30.0 - 36.0 g/dL   RDW 84.1 32.4 - 40.1 %   Platelets 246 150 - 400 K/uL   nRBC 0.0 0.0 - 0.2 %  Type and screen     Status: None   Collection Time: 05/19/24 12:55 PM  Result Value Ref Range   ABO/RH(D) O POS    Antibody Screen NEG    Sample Expiration      05/22/2024,2359 Performed at [facility], [address], [place], [place] [address]   Basic metabolic panel per protocol     Status: Abnormal   Collection Time: 05/19/24 12:55 PM  Result Value Ref Range   Sodium 137 135 - 145 mmol/L   Potassium 3.7 3.5 - 5.1 mmol/L   Chloride 107 98 - 111 mmol/L   CO2 21 (L) 22 - 32 mmol/L   Glucose, Bld 96 70 - 99 mg/dL   BUN 6 6 - 20 mg/dL   Creatinine, Ser 3.66 0.44 - 1.00 mg/dL   Calcium  9.0 8.9 - 10.3 mg/dL   GFR, Estimated >44 >03 mL/min   Anion gap 9 5 - 15    No results found.  Assessment/Plan: [id number] [id number] with menometrorrhagia and desiring sterilization.  Presenting for procedures as listed above.  Risks benefits and alternatives reviewed including but not limited to bleeding infection and injury.  Questions answered and consent signed and witnessed.  Tubal papers signed in the office on 04-14-24.  Medical clearance obtained from cardiology and pt stopped eliquis  on Sat.  Madelene Schanz 05/19/2024, 2:46 PM

## 2024-05-19 NOTE — Transfer of Care (Signed)
 Immediate Anesthesia Transfer of Care Note  Patient: Melissa [name]  Procedure(s) Performed: ABLATION, ENDOMETRIUM, HYSTEROSCOPIC (Uterus) SALPINGECTOMY, BILATERAL, LAPAROSCOPIC (Bilateral: Pelvis) DILATION AND CURETTAGE (Vagina )  Patient Location: PACU  Anesthesia Type:General  Level of Consciousness: awake, alert , and oriented  Airway & Oxygen  Therapy: Patient Spontanous Breathing  Post-op Assessment: Report given to RN and Post -op Vital signs reviewed and stable  Post vital signs: Reviewed and stable  Last Vitals:  Vitals Value Taken Time  BP 125/87 05/19/24 1647  Temp    Pulse 94 05/19/24 1649  Resp 14 05/19/24 1649  SpO2 93 % 05/19/24 1649  Vitals shown include unfiled device data.  Last Pain:  Vitals:   05/19/24 1235  TempSrc:   PainSc: 0-No pain      Patients Stated Pain Goal: 0 (05/19/24 1213)  Complications: No notable events documented.

## 2024-05-19 NOTE — Telephone Encounter (Signed)
 Copied from CRM [id number]. Topic: General - Other >> May 14, 2024  3:53 PM Hilton Lucky wrote: Reason for CRM: Patient is requesting to have a call back when her surgical clearance has been sent and a confirmation is received.  Front email if needed to [place]: [email or web address]  Duplicate, see message from 05/14/24.  Nothing further needed.

## 2024-05-20 ENCOUNTER — Encounter (HOSPITAL_COMMUNITY): Payer: Self-pay | Admitting: Obstetrics and Gynecology

## 2024-05-21 LAB — SURGICAL PATHOLOGY

## 2024-05-21 NOTE — Anesthesia Postprocedure Evaluation (Signed)
 Anesthesia Post Note  Patient: Patsy A Southers  Procedure(s) Performed: ABLATION, ENDOMETRIUM, HYSTEROSCOPIC (Uterus) SALPINGECTOMY, BILATERAL, LAPAROSCOPIC (Bilateral: Pelvis) DILATION AND CURETTAGE (Vagina )     Patient location during evaluation: PACU Anesthesia Type: General Level of consciousness: awake and alert Pain management: pain level controlled Vital Signs Assessment: post-procedure vital signs reviewed and stable Respiratory status: spontaneous breathing, nonlabored ventilation, respiratory function stable and patient connected to nasal cannula oxygen  Cardiovascular status: blood pressure returned to baseline and stable Postop Assessment: no apparent nausea or vomiting Anesthetic complications: no  No notable events documented.  Last Vitals:  Vitals:   05/19/24 1730 05/19/24 1740  BP: 128/74 117/79  Pulse: 79 75  Resp: 13 14  Temp:  36.5 C  SpO2: 98% 99%    Last Pain:  Vitals:   05/19/24 1648  TempSrc:   PainSc: 10-Worst pain ever                 [place] L Inaara Tye

## 2024-06-12 ENCOUNTER — Ambulatory Visit: Admitting: Family Medicine

## 2025-01-13 ENCOUNTER — Encounter: Payer: Self-pay | Admitting: Family Medicine

## 2025-02-18 ENCOUNTER — Encounter: Admitting: Family Medicine
